# Patient Record
Sex: Female | Born: 1980
Health system: Southern US, Community
[De-identification: ages and names within clinical notes are randomized; demographics above are authoritative.]

## PROBLEM LIST (undated history)

## (undated) ENCOUNTER — Inpatient Hospital Stay (HOSPITAL_COMMUNITY): Payer: Self-pay

## (undated) DIAGNOSIS — Z8669 Personal history of other diseases of the nervous system and sense organs: Secondary | ICD-10-CM

## (undated) DIAGNOSIS — F419 Anxiety disorder, unspecified: Secondary | ICD-10-CM

## (undated) DIAGNOSIS — R51 Headache: Secondary | ICD-10-CM

## (undated) DIAGNOSIS — K831 Obstruction of bile duct: Secondary | ICD-10-CM

## (undated) DIAGNOSIS — Z8042 Family history of malignant neoplasm of prostate: Secondary | ICD-10-CM

## (undated) DIAGNOSIS — Z8639 Personal history of other endocrine, nutritional and metabolic disease: Secondary | ICD-10-CM

## (undated) DIAGNOSIS — G43909 Migraine, unspecified, not intractable, without status migrainosus: Secondary | ICD-10-CM

## (undated) DIAGNOSIS — R112 Nausea with vomiting, unspecified: Secondary | ICD-10-CM

## (undated) DIAGNOSIS — Z8679 Personal history of other diseases of the circulatory system: Secondary | ICD-10-CM

## (undated) DIAGNOSIS — D649 Anemia, unspecified: Secondary | ICD-10-CM

## (undated) DIAGNOSIS — R935 Abnormal findings on diagnostic imaging of other abdominal regions, including retroperitoneum: Secondary | ICD-10-CM

## (undated) DIAGNOSIS — Z9889 Other specified postprocedural states: Secondary | ICD-10-CM

## (undated) DIAGNOSIS — C50212 Malignant neoplasm of upper-inner quadrant of left female breast: Secondary | ICD-10-CM

## (undated) DIAGNOSIS — E039 Hypothyroidism, unspecified: Secondary | ICD-10-CM

## (undated) DIAGNOSIS — K219 Gastro-esophageal reflux disease without esophagitis: Secondary | ICD-10-CM

## (undated) DIAGNOSIS — Z803 Family history of malignant neoplasm of breast: Secondary | ICD-10-CM

## (undated) DIAGNOSIS — I10 Essential (primary) hypertension: Secondary | ICD-10-CM

## (undated) DIAGNOSIS — I890 Lymphedema, not elsewhere classified: Secondary | ICD-10-CM

## (undated) DIAGNOSIS — R748 Abnormal levels of other serum enzymes: Secondary | ICD-10-CM

## (undated) DIAGNOSIS — Z973 Presence of spectacles and contact lenses: Secondary | ICD-10-CM

## (undated) DIAGNOSIS — L309 Dermatitis, unspecified: Secondary | ICD-10-CM

## (undated) DIAGNOSIS — K8020 Calculus of gallbladder without cholecystitis without obstruction: Secondary | ICD-10-CM

## (undated) DIAGNOSIS — Z9221 Personal history of antineoplastic chemotherapy: Secondary | ICD-10-CM

## (undated) DIAGNOSIS — T8859XA Other complications of anesthesia, initial encounter: Secondary | ICD-10-CM

## (undated) DIAGNOSIS — Z923 Personal history of irradiation: Secondary | ICD-10-CM

## (undated) HISTORY — DX: Malignant neoplasm of upper-inner quadrant of left female breast: C50.212

## (undated) HISTORY — DX: Obstruction of bile duct: K83.1

## (undated) HISTORY — PX: NO PAST SURGERIES: SHX2092

## (undated) HISTORY — DX: Hypothyroidism, unspecified: E03.9

## (undated) HISTORY — DX: Family history of malignant neoplasm of prostate: Z80.42

## (undated) HISTORY — DX: Essential (primary) hypertension: I10

## (undated) HISTORY — DX: Family history of malignant neoplasm of breast: Z80.3

## (undated) HISTORY — DX: Anxiety disorder, unspecified: F41.9

## (undated) HISTORY — DX: Headache: R51

## (undated) NOTE — *Deleted (*Deleted)
[facility]  [phone] [phone]) [phone]    ID: Virginia [name] DOB: November 25, 1980  MR#: [id number]  JYN#:[id number]  Patient Care Team: Ileana Ladd, MD as PCP - General (Family Medicine) Almond Lint, MD as Consulting Physician (General Surgery) Magrinat, Valentino Hue, MD as Consulting Physician (Oncology) Lonie Peak, MD as Attending Physician (Radiation Oncology) Axel Filler Larna Daughters, NP as Nurse Practitioner (Hematology and Oncology) OTHER MD:   CHIEF COMPLAINT:  Estrogen receptor positive breast cancer  CURRENT TREATMENT: goserelin, tamoxifen   INTERVAL HISTORY: Virginia [name] returns today for 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   She continues on tamoxifen.  She is tolerating the tamoxifen moderately well and is obtaining it at a good price.  She also continues on goserelin.  She is currently at the every 58-month dose.    We are following her estradiol levels: ***  Her most recent mammography was at [facility] on ***.  She is scheduled for salpingo-oophorectomy on 02/02/2020 under Dr. Pricilla Holm.   REVIEW OF SYSTEMS: Virginia [name]    COVID 19 VACCINATION STATUS:    BREAST CANCER HISTORY: From the original intake note:  Zaire noted some itching on her breasts and since this was the symptom that preceded her grandmother's breast cancer she underwent baseline bilateral screening mammography at [facility] 01/10/2016. The breast density was category B. In the left breast central to the nipple there was a 1.7 cm irregular high density mass with pleomorphic calcifications. Accordingly the patient was brought back 01/14/2016 for left diagnostic mammography and ultrasonography. This confirmed a 0.8 cm irregular mass in the left breast at the 10:00 position. 0.5 cm from the nipple lateral and posterior to that there was a 0.2 cm cluster of amorphous calcifications with an associated density. Anterior to the index mass was a 0.5 cm cluster of amorphous  calcifications. All 3 findings taken together 3 cm. Ultrasound of the left breast found a 1 cm irregular mass in the left breast at the 10:00 position retroareolarly. There were no other sonographic abnormalities and the left axilla was sonographically benign  On 01/15/2016 the patient underwent core needle biopsy of the left breast index lesion at 10:00, and this found invasive ductal carcinoma, E-cadherin positive, estrogen receptor 90% positive, progesterone receptor 40% positive, both with moderate staining intensity, with an MIB-1 of 5%, but HER-2 amplified, with a signals ratio of 2.41 and the number per cell 6.50. One of the 2 areas of abnormal calcifications, the more posterior one measuring 0.2 cm, was also biopsied and showed only atypical ductal hyperplasia (S [id number]).  Her subsequent history is as detailed below   PAST MEDICAL HISTORY: Past Medical History:  Diagnosis Date  . Anemia    as a child  . Anxiety   . Cholestasis   . Elevated liver enzymes    during pregnancy March 2015  . Family history of breast cancer   . Family history of prostate cancer   . GERD (gastroesophageal reflux disease)    one episode  . Headache   . History of radiation therapy 06/11/16- 07/23/16   Left Breast 50 Gy in 25 fractions, Left Breast boost 10 Gy in 5 fractions.   . Hypertension   . Hypothyroidism   . Lymphedema of arm    LEFT  . Malignant neoplasm of upper-inner quadrant of left female breast (HCC) 01/16/2016    PAST SURGICAL HISTORY: Past Surgical History:  Procedure Laterality Date  . BREAST LUMPECTOMY WITH RADIOACTIVE SEED AND SENTINEL  LYMPH NODE BIOPSY Left 03/13/2016   Procedure: LEFT BREAST LUMPECTOMY WITH RADIOACTIVE SEED X 2 AND SENTINEL LYMPH NODE BIOPSY;  Surgeon: Almond Lint, MD;  Location: [facility] OR;  Service: General;  Laterality: Left;  . NO PAST SURGERIES    . PORT-A-CATH REMOVAL N/A 04/16/2017   Procedure: REMOVAL PORT-A-CATH;  Surgeon: Almond Lint, MD;   Location: [facility] OR;  Service: General;  Laterality: N/A;  . PORTACATH PLACEMENT Right 03/13/2016   Procedure: INSERTION PORT-A-CATH;  Surgeon: Almond Lint, MD;  Location: [facility] OR;  Service: General;  Laterality: Right;    FAMILY HISTORY: Family History  Problem Relation Age of Onset  . Hypertension Mother   . Hypertension Father   . Prostate cancer Father   . Liver cancer Maternal Grandmother   . Breast cancer Paternal Grandmother        dx in her 27s  . Lung cancer Paternal Aunt        smoker  . Heart attack Paternal Grandfather   . Liver cancer Other    The patient's parents are in their mid 38s as of November 2017. The patient father was diagnosed with prostate cancer at age 44. A paternal aunt was diagnosed with lung cancer in her late 32s. Also the paternal grandmother was diagnosed with breast cancer at the age of 75. On the mother's side there are 2 cases of liver cancer, age of diagnosis unknown.   GYNECOLOGIC HISTORY:  No LMP recorded. (Menstrual status: Other).  Menarche age 22, first live birth age 57, she is GX P2. She was still having periods at the time of breast cancer diagnosis.. She remotely used a number ring and oral contraceptives but stopped in 2016.more recently they used barrier methods for contraception.   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  She will be completing a nurse practitioner program December 2021.  Her husband Virginia [name] works as a Fish farm manager for a [employer] worker. Their children are Virginia [name] age 64 and in 8th grade and Virginia [name] age 60 and in [place] as of September 2019. Ayliana is not a church attender   ADVANCED DIRECTIVES: In the absence of any documents to the contrary the patient's husband is her healthcare power of attorney   HEALTH MAINTENANCE: Social History   Tobacco Use  . Smoking status: Never Smoker  . Smokeless tobacco: Never Used  Vaping Use  . Vaping Use: Never used   Substance Use Topics  . Alcohol use: No  . Drug use: No     Colonoscopy:  PAP:2016  Bone density:   Allergies  Allergen Reactions  . Amoxicillin Diarrhea and Nausea Only    Has patient had a PCN reaction causing immediate rash, facial/tongue/throat swelling, SOB or lightheadedness with hypotension: No Has patient had a PCN reaction causing severe rash involving mucus membranes or skin necrosis: No Has patient had a PCN reaction that required hospitalization No Has patient had a PCN reaction occurring within the last 10 years: Yes If all of the above answers are "NO", then may proceed with Cephalosporin use.     Current Outpatient Medications  Medication Sig Dispense Refill  . acetaminophen (TYLENOL) 500 MG tablet Take 500 mg by mouth every 6 (six) hours as needed for headache.     Marland Kitchen amLODipine (NORVASC) 5 MG tablet Take 5 mg by mouth daily.    Marland Kitchen b complex vitamins tablet Take 1 tablet by mouth daily.    Marland Kitchen gabapentin (NEURONTIN) 300  MG capsule TAKE 1 CAPSULE BY MOUTH DAILY AT BEDTIME 90 capsule 3  . goserelin (ZOLADEX) 10.8 MG injection Inject 10.8 mg into the skin every 3 (three) months. She is not sure of the exact dose     . ibuprofen (ADVIL,MOTRIN) 200 MG tablet Take 400 mg by mouth every 8 (eight) hours as needed for mild pain.     Marland Kitchen lidocaine-prilocaine (EMLA) cream Apply 1 application topically as needed. 30 g 2  . Multiple Vitamins-Minerals (MULTIVITAMIN GUMMIES ADULT PO) Take 2 tablets by mouth daily.    . NONFORMULARY OR COMPOUNDED ITEM Vitamin E vaginal suppositories 200u/ml.  One pv three times weekly. 36 each 3  . Omega-3 Fatty Acids (FISH OIL) 1000 MG CAPS Take by mouth daily.    . tamoxifen (NOLVADEX) 20 MG tablet TAKE 1 TABLET BY MOUTH EVERY DAY 90 tablet 3  . venlafaxine XR (EFFEXOR-XR) 150 MG 24 hr capsule TAKE 1 CAPSULE BY MOUTH EVERY MORNING WITH BREAKFAST 90 capsule 5   No current facility-administered medications for this visit.    OBJECTIVE:  African-American woman who appears younger than stated age  There were no vitals filed for this visit.   There is no height or weight on file to calculate BMI.    ECOG FS:1 - Symptomatic but completely ambulatory   Sclerae unicteric, EOMs intact Wearing a mask No cervical or supraclavicular adenopathy Lungs no rales or rhonchi Heart regular rate and rhythm Abd soft, nontender, positive bowel sounds MSK no focal spinal tenderness, no upper extremity lymphedema Neuro: nonfocal, well oriented, appropriate affect Breasts:    {Sclerae unicteric, EOMs intact Wearing a mask No cervical or supraclavicular adenopathy Lungs no rales or rhonchi Heart regular rate and rhythm Abd soft, nontender, positive bowel sounds MSK no focal spinal tenderness, no upper extremity lymphedema Neuro: nonfocal, well oriented, appropriate affect Breasts: The right breast is benign.  The left breast has undergone lumpectomy followed by radiation.  There is no evidence of local recurrence.  Both axillae are benign.}   LAB RESULTS:  CMP     Component Value Date/Time   NA 140 06/22/2019 1314   NA 142 02/27/2017 1350   K 4.1 06/22/2019 1314   K 3.7 02/27/2017 1350   CL 105 06/22/2019 1314   CO2 29 06/22/2019 1314   CO2 28 02/27/2017 1350   GLUCOSE 88 06/22/2019 1314   GLUCOSE 124 02/27/2017 1350   BUN 8 06/22/2019 1314   BUN 8.9 02/27/2017 1350   CREATININE 0.79 06/22/2019 1314   CREATININE 0.79 07/31/2017 1349   CREATININE 0.8 02/27/2017 1350   CALCIUM 9.0 06/22/2019 1314   CALCIUM 9.5 02/27/2017 1350   PROT 7.2 06/22/2019 1314   PROT 7.3 02/27/2017 1350   ALBUMIN 3.8 06/22/2019 1314   ALBUMIN 3.8 02/27/2017 1350   AST 18 06/22/2019 1314   AST 24 07/31/2017 1349   AST 21 02/27/2017 1350   ALT 17 06/22/2019 1314   ALT 19 07/31/2017 1349   ALT 25 02/27/2017 1350   ALKPHOS 74 06/22/2019 1314   ALKPHOS 82 02/27/2017 1350   BILITOT 0.3 06/22/2019 1314   BILITOT <0.2 (L) 07/31/2017 1349    BILITOT 0.22 02/27/2017 1350   GFRNONAA >60 06/22/2019 1314   GFRNONAA >60 07/31/2017 1349   GFRAA >60 06/22/2019 1314   GFRAA >60 07/31/2017 1349    INo results found for: SPEP, UPEP  Lab Results  Component Value Date   WBC 3.1 (L) 06/22/2019   NEUTROABS 1.3 (L) 06/22/2019  HGB 12.1 06/22/2019   HCT 39.0 06/22/2019   MCV 85.9 06/22/2019   PLT 167 06/22/2019      Chemistry      Component Value Date/Time   NA 140 06/22/2019 1314   NA 142 02/27/2017 1350   K 4.1 06/22/2019 1314   K 3.7 02/27/2017 1350   CL 105 06/22/2019 1314   CO2 29 06/22/2019 1314   CO2 28 02/27/2017 1350   BUN 8 06/22/2019 1314   BUN 8.9 02/27/2017 1350   CREATININE 0.79 06/22/2019 1314   CREATININE 0.79 07/31/2017 1349   CREATININE 0.8 02/27/2017 1350      Component Value Date/Time   CALCIUM 9.0 06/22/2019 1314   CALCIUM 9.5 02/27/2017 1350   ALKPHOS 74 06/22/2019 1314   ALKPHOS 82 02/27/2017 1350   AST 18 06/22/2019 1314   AST 24 07/31/2017 1349   AST 21 02/27/2017 1350   ALT 17 06/22/2019 1314   ALT 19 07/31/2017 1349   ALT 25 02/27/2017 1350   BILITOT 0.3 06/22/2019 1314   BILITOT <0.2 (L) 07/31/2017 1349   BILITOT 0.22 02/27/2017 1350       No results found for: LABCA2  No components found for: LABCA125  No results for input(s): INR in the last 168 hours.  Urinalysis    Component Value Date/Time   COLORURINE YELLOW 10/14/2012 1954   APPEARANCEUR CLEAR 10/14/2012 1954   LABSPEC >1.030 (H) 10/14/2012 1954   PHURINE 6.0 10/14/2012 1954   GLUCOSEU NEGATIVE 10/14/2012 1954   HGBUR TRACE (A) 10/14/2012 1954   BILIRUBINUR NEGATIVE 10/14/2012 1954   KETONESUR 15 (A) 10/14/2012 1954   PROTEINUR NEGATIVE 10/14/2012 1954   UROBILINOGEN 0.2 10/14/2012 1954   NITRITE NEGATIVE 10/14/2012 1954   LEUKOCYTESUR NEGATIVE 10/14/2012 1954    STUDIES: No results found.   ELIGIBLE FOR AVAILABLE RESEARCH PROTOCOL: no   ASSESSMENT: 66 y.o. [place] woman status post left breast  upper inner quadrant biopsy 01/15/2016 for a clinical T1c N0, stage IA invasive ductal carcinoma, E-cadherin positive, estrogen receptor 90% positive, progesterone receptor 40% positive, both with moderate staining intensity, with an MIB-1 of 5%, but HER-2 amplified, with a signals ratio of 2.41 and the number per cell 6.50.  (a) One of 2 areas of abnormal calcifications, the more posterior one measuring 0.2 cm, was also biopsied and showed only atypical ductal hyperplasia  (b) a 4.1 cm area of non-masslike enhancement in the left breast underwent biopsy 02/19/2016 showing only fibrocystic changes  (1) tamoxifen started 01/23/2016 neoadjuvantly  (2) genetics testing 02/22/2016 through the Breast/Ovarian gene panel offered by [facility] found no deleterious mutations in  ATM, BARD1, BRCA1, BRCA2, BRIP1, CDH1, CHEK2, EPCAM, FANCC, MLH1, MSH2, MSH6, NBN, PALB2, PMS2, PTEN, RAD51C, RAD51D, TP53, and XRCC2.     (3) status post left lumpectomy and sentinel lymph node sampling 03/13/2016 for an mpT1c pN0, stage IA invasive ductal carcinoma, grade 2, with negative margins.  (4) paclitaxel weekly 12 and trastuzumab every 21 days beginning on 04/10/2016.  (a) paclitaxel discontinued after 5 doses because of atypical neuropathy symptoms. Last dose 04/28/2016  (5) trastuzumab continued to complete a year--last dose 04/10/2017  (a) echocardiogram 02/11/2016 shows ejection fraction in the 60-65% range.  (b) echo 05/28/2916 shows EF in the 65-70% range  (c) echo on 09/26/2016 shows LVEF of 55-60%  (d) echo on 01/14/2017 shows EF in the 60-65%  (6) adjuvant radiation 06/11/16 - 07/23/16 Site/dose:    1) Left Breast / 50 Gy in 25 fractions 2) Left Breast  Boost / 10 Gy in 5 fractions  (7) tamoxifen resumed to 07/25/2016  (8) patient resumed menses post chemo, April 2018  (a) goserelin started 06/17/2016, initially given every 28 days, changed to every 12 weeks on 11/12/16 with estradiol monitoring  (b) referred  06/22/2019 for BSO   PLAN: Kennyth Arnold is now a little over 3 years out from definitive surgery for her breast cancer with no evidence of disease recurrence.  This is very favorable.  She is tolerating tamoxifen well and the plan is to continue that a minimum of 5 years.  She is also receiving goserelin for ovarian suppression.  She is considering bilateral salpingo-oophorectomy for convenience.  She understands that that would not be reversible.  I have placed the referral in for her.  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  She will be able to take her exams no later than January apparently and should be able to start a new job shortly thereafter  She knows to call for any other issue that may develop before the next visit.  Total encounter time 25 minutes.Raymond Gurney C. Magrinat, MD  12/26/19 10:50 PM Medical Oncology and Hematology [facility] [address] [place], [place] [address] Tel. [phone]    Fax. [phone]   I, Mickie Bail, am acting as scribe for Dr. Valentino Hue. Magrinat.  I, Ruthann Cancer MD, have reviewed the above documentation for accuracy and completeness, and I agree with the above.   *Total Encounter Time as defined by the Centers for Medicare and Medicaid Services includes, in addition to the face-to-face time of a patient visit (documented in the note above) non-face-to-face time: obtaining and reviewing outside history, ordering and reviewing medications, tests or procedures, care coordination (communications with other health care professionals or caregivers) and documentation in the medical record.

---

## 2011-12-10 ENCOUNTER — Other Ambulatory Visit (HOSPITAL_COMMUNITY)
Admission: RE | Admit: 2011-12-10 | Discharge: 2011-12-10 | Disposition: A | Discharge status: Home / Self Care | Payer: 59 | Source: Ambulatory Visit | Attending: Family Medicine | Admitting: Family Medicine

## 2011-12-10 DIAGNOSIS — Z124 Encounter for screening for malignant neoplasm of cervix: Secondary | ICD-10-CM | POA: Insufficient documentation

## 2011-12-10 DIAGNOSIS — Z1151 Encounter for screening for human papillomavirus (HPV): Secondary | ICD-10-CM | POA: Insufficient documentation

## 2012-10-14 ENCOUNTER — Inpatient Hospital Stay (HOSPITAL_COMMUNITY): Payer: BC Managed Care – PPO

## 2012-10-14 ENCOUNTER — Inpatient Hospital Stay (HOSPITAL_COMMUNITY)
Admission: AD | Admit: 2012-10-14 | Discharge: 2012-10-15 | Disposition: A | Discharge status: Home / Self Care | Payer: BC Managed Care – PPO | Source: Ambulatory Visit | Attending: Obstetrics & Gynecology | Admitting: Obstetrics & Gynecology

## 2012-10-14 ENCOUNTER — Encounter (HOSPITAL_COMMUNITY): Payer: Self-pay | Admitting: Radiology

## 2012-10-14 DIAGNOSIS — O26891 Other specified pregnancy related conditions, first trimester: Secondary | ICD-10-CM

## 2012-10-14 DIAGNOSIS — R109 Unspecified abdominal pain: Secondary | ICD-10-CM | POA: Insufficient documentation

## 2012-10-14 DIAGNOSIS — N949 Unspecified condition associated with female genital organs and menstrual cycle: Secondary | ICD-10-CM | POA: Insufficient documentation

## 2012-10-14 DIAGNOSIS — O99891 Other specified diseases and conditions complicating pregnancy: Secondary | ICD-10-CM | POA: Insufficient documentation

## 2012-10-14 DIAGNOSIS — O209 Hemorrhage in early pregnancy, unspecified: Secondary | ICD-10-CM | POA: Insufficient documentation

## 2012-10-14 DIAGNOSIS — O9989 Other specified diseases and conditions complicating pregnancy, childbirth and the puerperium: Secondary | ICD-10-CM

## 2012-10-14 LAB — URINALYSIS, ROUTINE W REFLEX MICROSCOPIC
Bilirubin Urine: NEGATIVE
Glucose, UA: NEGATIVE mg/dL
Ketones, ur: 15 mg/dL — AB
Leukocytes, UA: NEGATIVE
Nitrite: NEGATIVE
Protein, ur: NEGATIVE mg/dL
Specific Gravity, Urine: >1.03 — ABNORMAL HIGH (ref 1.005–1.030)
Urobilinogen, UA: 0.2 mg/dL (ref 0.0–1.0)
pH: 6 (ref 5.0–8.0)

## 2012-10-14 LAB — URINE MICROSCOPIC-ADD ON

## 2012-10-14 LAB — CBC
HCT: 37 % (ref 36.0–46.0)
Hemoglobin: 12.4 g/dL (ref 12.0–15.0)
MCH: 28 pg (ref 26.0–34.0)
MCHC: 33.5 g/dL (ref 30.0–36.0)
MCV: 83.5 fL (ref 78.0–100.0)
Platelets: 199 10*3/uL (ref 150–400)
RBC: 4.43 MIL/uL (ref 3.87–5.11)
RDW: 12.8 % (ref 11.5–15.5)
WBC: 9.8 10*3/uL (ref 4.0–10.5)

## 2012-10-14 LAB — WET PREP, GENITAL
Clue Cells Wet Prep HPF POC: NONE SEEN
Trich, Wet Prep: NONE SEEN
Yeast Wet Prep HPF POC: NONE SEEN

## 2012-10-14 LAB — OB RESULTS CONSOLE GC/CHLAMYDIA
Chlamydia: NEGATIVE
Gonorrhea: NEGATIVE

## 2012-10-14 LAB — HCG, QUANTITATIVE, PREGNANCY: hCG, Beta Chain, Quant, S: 11582 m[IU]/mL — ABNORMAL HIGH (ref <5)

## 2012-10-14 LAB — POCT PREGNANCY, URINE: Preg Test, Ur: POSITIVE — AB

## 2012-10-14 NOTE — MAU Provider Note (Signed)
History     CSN: [id number]  Arrival date and time: 10/14/12 4098   None     Chief Complaint  Patient presents with  . Possible Pregnancy  . Abdominal Pain   HPI  Virginia [name] is a 32 y.o. G2P1001 at 6 weeks who presents today with cramping and brown vaginal discharge.   Past Medical History  Diagnosis Date  . Medical history non-contributory     Past Surgical History  Procedure Laterality Date  . No past surgeries      Family History  Problem Relation Age of Onset  . Hypertension Mother   . Hypertension Father     History  Substance Use Topics  . Smoking status: Never Smoker   . Smokeless tobacco: Not on file  . Alcohol Use: No    Allergies: No Known Allergies  No prescriptions prior to admission    ROS Physical Exam   Blood pressure 120/66, pulse 63, temperature 97.9 F (36.6 C), temperature source Oral, resp. rate 16, height 5\' 6"  (1.676 m), weight 78.019 kg (172 lb), last menstrual period 08/28/2012, SpO2 100.00%.  Physical Exam  Nursing note and vitals reviewed. Constitutional: She is oriented to person, place, and time. She appears well-developed and well-nourished. No distress.  Respiratory: Effort normal.  GI: Soft. There is no tenderness.  Genitourinary:   External: no lesion Vagina: small amount of white discharge Cervix: pink, smooth, no CMT Uterus: NSSC Adnexa: NT   Neurological: She is alert and oriented to person, place, and time.  Skin: Skin is warm and dry.  Psychiatric: She has a normal mood and affect.    MAU Course  Procedures  Results for orders placed during the hospital encounter of 10/14/12 (from the past 24 hour(s))  URINALYSIS, ROUTINE W REFLEX MICROSCOPIC     Status: Abnormal   Collection Time    10/14/12  7:54 PM      Result Value Range   Color, Urine YELLOW  YELLOW   APPearance CLEAR  CLEAR   Specific Gravity, Urine >1.030 (*) 1.005 - 1.030   pH 6.0  5.0 - 8.0   Glucose, UA NEGATIVE  NEGATIVE mg/dL    Hgb urine dipstick TRACE (*) NEGATIVE   Bilirubin Urine NEGATIVE  NEGATIVE   Ketones, ur 15 (*) NEGATIVE mg/dL   Protein, ur NEGATIVE  NEGATIVE mg/dL   Urobilinogen, UA 0.2  0.0 - 1.0 mg/dL   Nitrite NEGATIVE  NEGATIVE   Leukocytes, UA NEGATIVE  NEGATIVE  URINE MICROSCOPIC-ADD ON     Status: None   Collection Time    10/14/12  7:54 PM      Result Value Range   Squamous Epithelial / LPF RARE  RARE   WBC, UA 0-2  <3 WBC/hpf   RBC / HPF 0-2  <3 RBC/hpf   Urine-Other MUCOUS PRESENT    CBC     Status: None   Collection Time    10/14/12  8:11 PM      Result Value Range   WBC 9.8  4.0 - 10.5 K/uL   RBC 4.43  3.87 - 5.11 MIL/uL   Hemoglobin 12.4  12.0 - 15.0 g/dL   HCT 11.9  14.7 - 82.9 %   MCV 83.5  78.0 - 100.0 fL   MCH 28.0  26.0 - 34.0 pg   MCHC 33.5  30.0 - 36.0 g/dL   RDW 56.2  13.0 - 86.5 %   Platelets 199  150 - 400 K/uL  POCT PREGNANCY, URINE  Status: Abnormal   Collection Time    10/14/12  8:13 PM      Result Value Range   Preg Test, Ur POSITIVE (*) NEGATIVE  ABO/RH     Status: None   Collection Time    10/14/12  8:20 PM      Result Value Range   ABO/RH(D) O POS    HCG, QUANTITATIVE, PREGNANCY     Status: Abnormal   Collection Time    10/14/12  8:20 PM      Result Value Range   hCG, Beta Chain, Quant, [place] [address] (*) <5 mIU/mL  WET PREP, GENITAL     Status: Abnormal   Collection Time    10/14/12 11:07 PM      Result Value Range   Yeast Wet Prep HPF POC NONE SEEN  NONE SEEN   Trich, Wet Prep NONE SEEN  NONE SEEN   Clue Cells Wet Prep HPF POC NONE SEEN  NONE SEEN   WBC, Wet Prep HPF POC FEW (*) NONE SEEN   US Ob Comp Less 14 Wks  10/14/2012   *RADIOLOGY REPORT*  Clinical Data: Abdominal pain.  Vaginal discharge.  Early pregnancy.  OBSTETRIC <14 WK [place] AND TRANSVAGINAL OB US  Technique:  Both transabdominal and transvaginal ultrasound examinations were performed for complete evaluation of the gestation as well as the maternal uterus, adnexal regions, and pelvic  cul-de-sac.  Transvaginal technique was performed to assess early pregnancy.  Comparison:  None.  Intrauterine gestational sac:  Single Yolk sac: Yes Embryo: Yes Cardiac Activity: Yes Heart Rate: 170 bpm CRL: 2.2  mm  five w  six d         [place] EDC: 06/10/2013  Maternal uterus/adnexae: There is a tiny subchorionic hemorrhage.  The ovaries are normal. Trace of free fluid in the pelvis.  IMPRESSION: Single intrauterine pregnancy of 5 weeks 6 days gestation.  Tiny subchorionic hemorrhage.   Original Report Authenticated By: Francene Boyers, M.D.   US Ob Transvaginal  10/14/2012   *RADIOLOGY REPORT*  Clinical Data: Abdominal pain.  Vaginal discharge.  Early pregnancy.  OBSTETRIC <14 WK [place] AND TRANSVAGINAL OB US  Technique:  Both transabdominal and transvaginal ultrasound examinations were performed for complete evaluation of the gestation as well as the maternal uterus, adnexal regions, and pelvic cul-de-sac.  Transvaginal technique was performed to assess early pregnancy.  Comparison:  None.  Intrauterine gestational sac:  Single Yolk sac: Yes Embryo: Yes Cardiac Activity: Yes Heart Rate: 170 bpm CRL: 2.2  mm  five w  six d         [place] EDC: 06/10/2013  Maternal uterus/adnexae: There is a tiny subchorionic hemorrhage.  The ovaries are normal. Trace of free fluid in the pelvis.  IMPRESSION: Single intrauterine pregnancy of 5 weeks 6 days gestation.  Tiny subchorionic hemorrhage.   Original Report Authenticated By: Francene Boyers, M.D.     Assessment and Plan   1. Pelvic pain complicating pregnancy, antepartum, first trimester    Start [facility] as planned 1st trimester danger signs reviewed  Tawnya Crook 10/15/2012, 12:31 AM

## 2012-10-14 NOTE — MAU Note (Signed)
Pt states she was having cramping pain at her sides and she had the pain again about 0900,

## 2012-10-14 NOTE — MAU Note (Signed)
Pt reports she thinks she is having a miscarriage, states positive pregnancy test 3 weeks ago, has had strong cramping x 2 weeks off/on. States very painful at times, brownish discharge since yesterday.

## 2012-10-15 LAB — ABO/RH: ABO/RH(D): O POS

## 2012-10-15 LAB — GC/CHLAMYDIA PROBE AMP
CT Probe RNA: NEGATIVE
GC Probe RNA: NEGATIVE

## 2012-10-18 NOTE — MAU Provider Note (Signed)
Attestation of Attending Supervision of Advanced Practitioner (CNM/NP): Evaluation and management procedures were performed by the Advanced Practitioner under my supervision and collaboration.  I have reviewed the Advanced Practitioner's note and chart, and I agree with the management and plan.  HARRAWAY-SMITH, Zayaan Kozak 2:04 PM

## 2012-11-30 LAB — OB RESULTS CONSOLE ANTIBODY SCREEN: Antibody Screen: NEGATIVE

## 2012-11-30 LAB — OB RESULTS CONSOLE RPR: RPR: NONREACTIVE

## 2012-11-30 LAB — OB RESULTS CONSOLE ABO/RH: RH Type: POSITIVE

## 2012-11-30 LAB — OB RESULTS CONSOLE HIV ANTIBODY (ROUTINE TESTING): HIV: NONREACTIVE

## 2012-11-30 LAB — OB RESULTS CONSOLE RUBELLA ANTIBODY, IGM: Rubella: IMMUNE

## 2012-11-30 LAB — OB RESULTS CONSOLE HEPATITIS B SURFACE ANTIGEN: Hepatitis B Surface Ag: NEGATIVE

## 2013-02-24 NOTE — L&D Delivery Note (Signed)
Patient was C/C/+2 and pushed for 40 minutes with epidural.   NSVD  female infant, Apgars 9,9, weight P.   The patient had no lacerations. Fundus was firm. EBL was expected. Placenta was delivered intact. Vagina was clear.  Baby was vigorous and doing skin to skin with mother.  Pt desires PP tubal but first case not available until after 3 pm- will schedule 6 weeks pp.  Saia Derossett A

## 2013-04-28 ENCOUNTER — Encounter (HOSPITAL_COMMUNITY): Payer: Self-pay | Admitting: *Deleted

## 2013-04-28 ENCOUNTER — Observation Stay (HOSPITAL_COMMUNITY)
Admission: AD | Admit: 2013-04-28 | Discharge: 2013-04-29 | DRG: 781 | Disposition: A | Discharge status: Home / Self Care | Payer: BC Managed Care – PPO | Source: Ambulatory Visit | Attending: Obstetrics and Gynecology | Admitting: Obstetrics and Gynecology

## 2013-04-28 DIAGNOSIS — O9989 Other specified diseases and conditions complicating pregnancy, childbirth and the puerperium: Secondary | ICD-10-CM | POA: Diagnosis present

## 2013-04-28 DIAGNOSIS — R7989 Other specified abnormal findings of blood chemistry: Secondary | ICD-10-CM | POA: Diagnosis present

## 2013-04-28 DIAGNOSIS — L299 Pruritus, unspecified: Secondary | ICD-10-CM | POA: Diagnosis present

## 2013-04-28 DIAGNOSIS — K838 Other specified diseases of biliary tract: Secondary | ICD-10-CM | POA: Diagnosis present

## 2013-04-28 DIAGNOSIS — O26649 Intrahepatic cholestasis of pregnancy, unspecified trimester: Secondary | ICD-10-CM | POA: Diagnosis present

## 2013-04-28 DIAGNOSIS — O26619 Liver and biliary tract disorders in pregnancy, unspecified trimester: Principal | ICD-10-CM | POA: Diagnosis present

## 2013-04-28 DIAGNOSIS — K831 Obstruction of bile duct: Secondary | ICD-10-CM | POA: Diagnosis present

## 2013-04-28 LAB — COMPREHENSIVE METABOLIC PANEL
ALT: 160 U/L — ABNORMAL HIGH (ref 0–35)
AST: 108 U/L — ABNORMAL HIGH (ref 0–37)
Albumin: 2.6 g/dL — ABNORMAL LOW (ref 3.5–5.2)
Alkaline Phosphatase: 163 U/L — ABNORMAL HIGH (ref 39–117)
BUN: 7 mg/dL (ref 6–23)
CO2: 25 mEq/L (ref 19–32)
Calcium: 9.1 mg/dL (ref 8.4–10.5)
Chloride: 102 mEq/L (ref 96–112)
Creatinine, Ser: 0.53 mg/dL (ref 0.50–1.10)
GFR calc Af Amer: >90 mL/min (ref >90)
GFR calc non Af Amer: >90 mL/min (ref >90)
Glucose, Bld: 82 mg/dL (ref 70–99)
Potassium: 3.9 mEq/L (ref 3.7–5.3)
Sodium: 137 mEq/L (ref 137–147)
Total Bilirubin: <0.2 mg/dL — ABNORMAL LOW (ref 0.3–1.2)
Total Protein: 6.8 g/dL (ref 6.0–8.3)

## 2013-04-28 LAB — CBC
HCT: 34.1 % — ABNORMAL LOW (ref 36.0–46.0)
Hemoglobin: 11.2 g/dL — ABNORMAL LOW (ref 12.0–15.0)
MCH: 28.2 pg (ref 26.0–34.0)
MCHC: 32.8 g/dL (ref 30.0–36.0)
MCV: 85.9 fL (ref 78.0–100.0)
Platelets: 210 10*3/uL (ref 150–400)
RBC: 3.97 MIL/uL (ref 3.87–5.11)
RDW: 13.2 % (ref 11.5–15.5)
WBC: 8.6 10*3/uL (ref 4.0–10.5)

## 2013-04-28 LAB — URIC ACID: Uric Acid, Serum: 3.3 mg/dL (ref 2.4–7.0)

## 2013-04-28 MED ORDER — BETAMETHASONE SOD PHOS & ACET 6 (3-3) MG/ML IJ SUSP
12.0000 mg | INTRAMUSCULAR | Status: AC
  Administered 2013-04-28 – 2013-04-29 (×2): 12 mg via INTRAMUSCULAR
  Filled 2013-04-28 (×2): qty 2

## 2013-04-28 MED ORDER — ZOLPIDEM TARTRATE 5 MG PO TABS: 5.0000 mg | ORAL_TABLET | Freq: Every evening | ORAL | Status: DC | PRN

## 2013-04-28 MED ORDER — DOCUSATE SODIUM 100 MG PO CAPS
100.0000 mg | ORAL_CAPSULE | Freq: Every day | ORAL | Status: DC
  Administered 2013-04-29: 100 mg via ORAL
  Filled 2013-04-28: qty 1

## 2013-04-28 MED ORDER — CALCIUM CARBONATE ANTACID 500 MG PO CHEW: 2.0000 | CHEWABLE_TABLET | ORAL | Status: DC | PRN

## 2013-04-28 MED ORDER — URSODIOL 300 MG PO CAPS
300.0000 mg | ORAL_CAPSULE | Freq: Two times a day (BID) | ORAL | Status: DC
  Administered 2013-04-28 – 2013-04-29 (×2): 300 mg via ORAL
  Filled 2013-04-28 (×4): qty 1

## 2013-04-28 MED ORDER — PRENATAL MULTIVITAMIN CH
1.0000 | ORAL_TABLET | Freq: Every day | ORAL | Status: DC
  Administered 2013-04-29: 1 via ORAL
  Filled 2013-04-28: qty 1

## 2013-04-28 MED ORDER — ACETAMINOPHEN 325 MG PO TABS
650.0000 mg | ORAL_TABLET | ORAL | Status: DC | PRN
  Administered 2013-04-29: 650 mg via ORAL
  Filled 2013-04-28: qty 2

## 2013-04-28 NOTE — H&P (Addendum)
Virginia [name] is a 33 y.o. female presenting for cholestasis of pregnancy with elevated liver functions  33 yo G2P1001 @ 33+6 presented to the office this afternoon for routine prenatal care. She has a 2 week history of Systemic itching. Patient presented to her primary care physician for evaluation and had blood work drawn last week. She presented to the office today for her routine prenatal visit and informed the physician of her symptoms and that blood work had been previously performed. Her primary care physician's office was contacted and her results obtained. Her AST was 163, ALT 206, and bile acids 15.7. She was placed on ursodiol by her PCP. History OB History   Grav Para Term Preterm Abortions TAB SAB Ect Mult Living   2 1 1       1      Past Medical History  Diagnosis Date  . Medical history non-contributory    Past Surgical History  Procedure Laterality Date  . No past surgeries     Family History: family history includes Hypertension in her father and mother. Social History:  reports that she has never smoked. She does not have any smokeless tobacco history on file. She reports that she does not drink alcohol or use illicit drugs.   Prenatal Transfer Tool  Maternal Diabetes: No Genetic Screening: Normal, 78 XX by amnio Maternal Ultrasounds/Referrals: Normal Fetal Ultrasounds or other Referrals:  None Maternal Substance Abuse:  No Significant Maternal Medications:  Meds include: Other: Ursodiol Significant Maternal Lab Results:  None Other Comments:  None  ROS: systemic itching    Blood pressure 132/69, pulse 78, temperature 97.6 F (36.4 C), resp. rate 18, height 5\' 6"  (1.676 m), weight 96.163 kg (212 lb), last menstrual period 08/28/2012. Exam Physical Exam  Prenatal labs: ABO, Rh: --/--/O POS (08/21 2020) Antibody:  Negative Rubella:  Immune RPR:   NR HBsAg:   Neg HIV:   NR GBS:   Pending  CMP     Component Value Date/Time   NA 137 04/28/2013 1824   K  3.9 04/28/2013 1824   CL 102 04/28/2013 1824   CO2 25 04/28/2013 1824   GLUCOSE 82 04/28/2013 1824   BUN 7 04/28/2013 1824   CREATININE 0.53 04/28/2013 1824   CALCIUM 9.1 04/28/2013 1824   PROT 6.8 04/28/2013 1824   ALBUMIN 2.6* 04/28/2013 1824   AST 108* 04/28/2013 1824   ALT 160* 04/28/2013 1824   ALKPHOS 163* 04/28/2013 1824   BILITOT <0.2* 04/28/2013 1824   GFRNONAA >90 04/28/2013 1824   GFRAA >90 04/28/2013 1824    CBC    Component Value Date/Time   WBC 8.6 04/28/2013 1824   RBC 3.97 04/28/2013 1824   HGB 11.2* 04/28/2013 1824   HCT 34.1* 04/28/2013 1824   PLT 210 04/28/2013 1824   MCV 85.9 04/28/2013 1824   MCH 28.2 04/28/2013 1824   MCHC 32.8 04/28/2013 1824   RDW 13.2 04/28/2013 1824    Assessment/Plan: 1) Admit 2) Repeat CMP, LDH, Uric, Bile acids 3) BMZ to enhance FLM 4) AST & ALT improved tonight compared to last blood draw. Bile acids pending.  4) MFM consultation for recommendations on expectant management vs delivery  Virginia [name] H. 04/28/2013, 7:10 PM

## 2013-04-29 ENCOUNTER — Inpatient Hospital Stay (HOSPITAL_COMMUNITY): Payer: BC Managed Care – PPO

## 2013-04-29 LAB — TSH: TSH: 1.597 u[IU]/mL (ref 0.350–4.500)

## 2013-04-29 LAB — T4, FREE: Free T4: 0.81 ng/dL (ref 0.80–1.80)

## 2013-04-29 MED ORDER — HYDROXYZINE HCL 25 MG PO TABS
25.0000 mg | ORAL_TABLET | Freq: Three times a day (TID) | ORAL | Status: DC | PRN
  Administered 2013-04-29: 25 mg via ORAL
  Filled 2013-04-29 (×2): qty 1

## 2013-04-29 MED ORDER — HYDROXYZINE HCL 25 MG PO TABS: 25.0000 mg | ORAL_TABLET | Freq: Three times a day (TID) | ORAL | Status: DC | PRN

## 2013-04-29 NOTE — Discharge Summary (Signed)
  Virginia [name] was admitted with elevated LFTs and bile acids with cholestasis of pregnancy.  FHT remained reassuring.  LFTs were slightly improved from outpatient levels.  MFM consulted with patient and recommended outpatient treatment with ursodiol 500mg  bid, with possible increase to tid and addition of hydoxyzine 25mg  prn itching.  She was instructed to return to the office Tuesday for NST and fetal kick count precautions were reviewed.  I reviewed other instructions including biweekly NSTs with weekly AFI and delivery between 36 and 37 weeks, no later than 37weeks.  Patient verbalize understanding of the risks of cholestasis including fetal demise.

## 2013-04-29 NOTE — Progress Notes (Signed)
HD#2 cholestasis of pregnancy with elevated LFTs  Filed Vitals:   04/28/13 1752 04/28/13 2000 04/29/13 0112 04/29/13 0755  BP:  121/69 111/60 118/70  Pulse:  81 99 84  Temp:  97.7 F (36.5 C)  97.8 F (36.6 C)  TempSrc:  Oral  Oral  Resp:  18 20 20   Height: 5\' 6"  (1.676 m)     Weight: 96.163 kg (212 lb)         Lab Results  Component Value Date   WBC 8.6 04/28/2013   HGB 11.2* 04/28/2013   HCT 34.1* 04/28/2013   MCV 85.9 04/28/2013   PLT 210 04/28/2013      A/P 34.0 S/p MFM consult this am, no note yet, however patient states they told her she was ok for outpatient mgmt, antenatal testing and delivery at 37 weeks. Due for second dose of betamethasone around 5-6pm Will await full note from MFM and anticipate d/c this evening. Patient instructed to call office for antenatal testing appt Tuesday.  Virginia [name]

## 2013-04-29 NOTE — Consult Note (Signed)
Maternal Fetal Medicine Consultation  Requesting Provider(s): Vanessa Kick, MD  Reason for consultation: cholestasis of pregnancy  HPI: Virginia [name] is a 33 yo G2P1001, EDD 06/10/2013 currently at 34 0/7 weeks who is currently admitted due to a diagnosis of cholestasis of pregnancy.  She report the onset of pruritis approximately 2 weeks ago - mostly on back/ abdomen as well as the soles of her hands and feet.  She was seen by her PCM and started on Ursodiol.  She reports that the pruritis has improved "maybe a little bit".  She also reports that she had similar symptoms with her last pregnancy but was never given this diagnosis.  Her prenatal course has otherwise been uncomplicated.    OB History: OB History   Grav Para Term Preterm Abortions TAB SAB Ect Mult Living   2 1 1       1       PMH:  Past Medical History  Diagnosis Date  . Medical history non-contributory     PSH:  Past Surgical History  Procedure Laterality Date  . No past surgeries     Meds:  Scheduled Meds: . betamethasone acetate-betamethasone sodium phosphate  12 mg Intramuscular Q24H  . docusate sodium  100 mg Oral Daily  . prenatal multivitamin  1 tablet Oral Q1200  . ursodiol  300 mg Oral BID   Continuous Infusions:  PRN Meds:.acetaminophen, calcium carbonate, zolpidem  Allergies: No Known Allergies  FH:  Family History  Problem Relation Age of Onset  . Hypertension Mother   . Hypertension Father    Soc:  History   Social History  . Marital Status: Married    Spouse Name: N/A    Number of Children: N/A  . Years of Education: N/A   Occupational History  . Not on file.   Social History Main Topics  . Smoking status: Never Smoker   . Smokeless tobacco: Not on file  . Alcohol Use: No  . Drug Use: No  . Sexual Activity: Yes   Other Topics Concern  . Not on file   Social History Narrative  . No narrative on file    Review of Systems: no vaginal bleeding or cramping/contractions, no  LOF, no nausea/vomiting. All other systems reviewed and are negative.  PE:   Filed Vitals:   04/29/13 0755  BP: 118/70  Pulse: 84  Temp: 97.8 F (36.6 C)  Resp: 20   Labs: CBC    Component Value Date/Time   WBC 8.6 04/28/2013 1824   RBC 3.97 04/28/2013 1824   HGB 11.2* 04/28/2013 1824   HCT 34.1* 04/28/2013 1824   PLT 210 04/28/2013 1824   MCV 85.9 04/28/2013 1824   MCH 28.2 04/28/2013 1824   MCHC 32.8 04/28/2013 1824   RDW 13.2 04/28/2013 1824   CMP     Component Value Date/Time   NA 137 04/28/2013 1824   K 3.9 04/28/2013 1824   CL 102 04/28/2013 1824   CO2 25 04/28/2013 1824   GLUCOSE 82 04/28/2013 1824   BUN 7 04/28/2013 1824   CREATININE 0.53 04/28/2013 1824   CALCIUM 9.1 04/28/2013 1824   PROT 6.8 04/28/2013 1824   ALBUMIN 2.6* 04/28/2013 1824   AST 108* 04/28/2013 1824   ALT 160* 04/28/2013 1824   ALKPHOS 163* 04/28/2013 1824   BILITOT <0.2* 04/28/2013 1824   GFRNONAA >90 04/28/2013 1824   GFRAA >90 04/28/2013 1824   Bile acids - 15.7   A/P: 1) Single IUP at 34 0/7 weeks  2) Cholestasis of pregnancy - based on elevated bile acids, diffuse pruritis and elevated LFTs.  Serum aminotransferases in the 100's and not unusual and values into the 1000's have been reported.  The cardial feature of ICP (pruritis) makes other forms of liver disease much less likely (i.e. HELLP syndrome).  We briefly discussed perinatal morbidity including increased risk of prematurity, meconium-stained amniotic fluid and intrauterine fetal demise.  The risk of spontaneous fetal death attributed to ICP is approximately 1.2% after 37 weeks and appears to be increased with higher bile acid levels.  Recommendations: 1) Would increase Ursodiol to 500 mg BID - may increase to 300 TID if needed and continue until delivery.  Would add Hydroxyzine 25-50 mg daily for symptomatic relief if needed. 2) Feel that the patient may be managed as an outpatient.  Would recommend 2x weekly NSTs with weekly AFIs although the value of antepartum  fetal testing to identify fetuses at risk for demise in the setting of ICP is unproven. 3) There is no clear consensus regarding the timing of delivery; however the median gestational age for spontaneous fetal demise with ICP occurs at [redacted] weeks gestation.  I would recommend delivery at 36-37 weeks (with delivery no later than [redacted] weeks gestation).  My practice is not to perform amniocentesis for fetal lung maturity and concur with course of betamethasone as written.   Thank you for the opportunity to be a part of the care of Virginia [name]. Please contact our office if we can be of further assistance.   I spent approximately 30 minutes with this patient with over 50% of time spent in face-to-face counseling.  Benjaman Lobe, MD Maternal Fetal Medicine

## 2013-05-01 LAB — CULTURE, BETA STREP (GROUP B ONLY)

## 2013-05-11 ENCOUNTER — Inpatient Hospital Stay (HOSPITAL_COMMUNITY): Payer: BC Managed Care – PPO

## 2013-05-11 ENCOUNTER — Encounter (HOSPITAL_COMMUNITY): Payer: Self-pay | Admitting: *Deleted

## 2013-05-11 ENCOUNTER — Inpatient Hospital Stay (HOSPITAL_COMMUNITY)
Admission: AD | Admit: 2013-05-11 | Discharge: 2013-05-11 | Disposition: A | Discharge status: Home / Self Care | Payer: BC Managed Care – PPO | Source: Ambulatory Visit | Attending: Obstetrics and Gynecology | Admitting: Obstetrics and Gynecology

## 2013-05-11 ENCOUNTER — Telehealth (HOSPITAL_COMMUNITY): Payer: Self-pay | Admitting: *Deleted

## 2013-05-11 DIAGNOSIS — R109 Unspecified abdominal pain: Secondary | ICD-10-CM | POA: Insufficient documentation

## 2013-05-11 DIAGNOSIS — O26849 Uterine size-date discrepancy, unspecified trimester: Secondary | ICD-10-CM | POA: Insufficient documentation

## 2013-05-11 DIAGNOSIS — K831 Obstruction of bile duct: Secondary | ICD-10-CM

## 2013-05-11 DIAGNOSIS — O4703 False labor before 37 completed weeks of gestation, third trimester: Secondary | ICD-10-CM

## 2013-05-11 DIAGNOSIS — E669 Obesity, unspecified: Secondary | ICD-10-CM | POA: Insufficient documentation

## 2013-05-11 DIAGNOSIS — O4700 False labor before 37 completed weeks of gestation, unspecified trimester: Secondary | ICD-10-CM | POA: Insufficient documentation

## 2013-05-11 DIAGNOSIS — O26613 Liver and biliary tract disorders in pregnancy, third trimester: Secondary | ICD-10-CM

## 2013-05-11 DIAGNOSIS — M549 Dorsalgia, unspecified: Secondary | ICD-10-CM | POA: Insufficient documentation

## 2013-05-11 DIAGNOSIS — O26619 Liver and biliary tract disorders in pregnancy, unspecified trimester: Secondary | ICD-10-CM | POA: Insufficient documentation

## 2013-05-11 DIAGNOSIS — K838 Other specified diseases of biliary tract: Secondary | ICD-10-CM | POA: Insufficient documentation

## 2013-05-11 DIAGNOSIS — O99210 Obesity complicating pregnancy, unspecified trimester: Secondary | ICD-10-CM

## 2013-05-11 NOTE — MAU Provider Note (Signed)
Chief Complaint:  Abdominal Pain and Back Pain   First Provider Initiated Contact with Patient 05/11/13 1332      HPI: Virginia [name] is a 33 y.o. G2P1001 at [redacted]w[redacted]d who has been feeling intermittent q 15-20 min lower abdominal contraction pain and LBP since around 8:30 this morning. She had normal ultrasound 5 days ago and reactive NST yesterday. Denies leakage of fluid or vaginal bleeding. Good fetal movement.   Pregnancy Course: Cholestasis of pregnancy with elevated LFTs, on ursodiol 500 mg bid and received BMZ; obesity; AMA  Past Medical History: Past Medical History  Diagnosis Date  . Hypothyroidism     Past obstetric history: OB History  Gravida Para Term Preterm AB SAB TAB Ectopic Multiple Living  2 1 1       1     # Outcome Date GA Lbr Len/2nd Weight Sex Delivery Anes PTL Lv  2 CUR           1 TRM 2005 [redacted]w[redacted]d  3.232 kg (7 lb 2 oz) M SVD   Y      Past Surgical History: Past Surgical History  Procedure Laterality Date  . No past surgeries       Family History: Family History  Problem Relation Age of Onset  . Hypertension Mother   . Hypertension Father     Social History: History  Substance Use Topics  . Smoking status: Never Smoker   . Smokeless tobacco: Not on file  . Alcohol Use: No    Allergies: No Known Allergies  Meds:  Prescriptions prior to admission  Medication Sig Dispense Refill  . hydrOXYzine (ATARAX/VISTARIL) 25 MG tablet Take 1 tablet (25 mg total) by mouth 3 (three) times daily as needed (itching).  30 tablet  0  . Prenatal Vit-Fe Fumarate-FA (PRENATAL MULTIVITAMIN) TABS tablet Take 1 tablet by mouth at bedtime.        ROS: Pertinent findings in history of present illness.  Physical Exam  Blood pressure 109/67, pulse 118, temperature 97.9 F (36.6 C), temperature source Oral, resp. rate 20, height 5\' 6"  (1.676 m), weight 95.346 kg (210 lb 3.2 oz), last menstrual period 08/28/2012. GENERAL: Well-developed, well-nourished female in no  acute distress.  HEENT: normocephalic HEART: normal rate RESP: normal effort ABDOMEN: Soft, non-tender, gravid appropriate for gestational age EXTREMITIES: Nontender, no edema NEURO: alert and oriented SPECULUM EXAM: NEFG, physiologic discharge, no blood, cervix clean Dilation: Fingertip Effacement (%): Thick Cervical Position: Posterior Presentation: Vertex Exam by:: D. Adleigh Mcmasters, CNM Cx post, ext loose 1, int closed, cephalic ballotable FHT:  Baseline 140>130, moderate variability, accelerations present, occsional mild variables 115-120 decelerations Contractions: irregular, mild      Labs: No results found for this or any previous visit (from the past 24 hour(s)).  Imaging:  BPP 8/8  MAU Course: BPP10/10  Assessment: 1. False labor before 37 completed weeks of gestation in third trimester   2. Cholestasis of pregnancy in third trimester     Plan: Discharge home Labor precautions and fetal kick counts    Medication List         acetaminophen 325 MG tablet  Commonly known as:  TYLENOL  Take 650 mg by mouth every 6 (six) hours as needed.     hydrOXYzine 25 MG tablet  Commonly known as:  ATARAX/VISTARIL  Take 1 tablet (25 mg total) by mouth 3 (three) times daily as needed (itching).     prenatal multivitamin Tabs tablet  Take 1 tablet by mouth at bedtime.  Follow-up Information   Follow up with HORVATH,MICHELLE A, MD On 05/13/2013.   Specialty:  Obstetrics and Gynecology   Contact information:   [place] [id number] [phone]       Lorene Dy, CNM 05/11/2013 1:40 PM

## 2013-05-11 NOTE — Telephone Encounter (Signed)
Preadmission screen  

## 2013-05-11 NOTE — Discharge Instructions (Signed)
Fetal Movement Counts Patient Name: __________________________________________________ Patient Due Date: ____________________ Performing a fetal movement count is highly recommended in high-risk pregnancies, but it is good for every pregnant woman to do. Your caregiver may ask you to start counting fetal movements at 28 weeks of the pregnancy. Fetal movements often increase:  After eating a full meal.  After physical activity.  After eating or drinking something sweet or cold.  At rest. Pay attention to when you feel the baby is most active. This will help you notice a pattern of your baby's sleep and wake cycles and what factors contribute to an increase in fetal movement. It is important to perform a fetal movement count at the same time each day when your baby is normally most active.  HOW TO COUNT FETAL MOVEMENTS 1. Find a quiet and comfortable area to sit or lie down on your left side. Lying on your left side provides the best blood and oxygen circulation to your baby. 2. Write down the day and time on a sheet of paper or in a journal. 3. Start counting kicks, flutters, swishes, rolls, or jabs in a 2 hour period. You should feel at least 10 movements within 2 hours. 4. If you do not feel 10 movements in 2 hours, wait 2 3 hours and count again. Look for a change in the pattern or not enough counts in 2 hours. SEEK MEDICAL CARE IF:  You feel less than 10 counts in 2 hours, tried twice.  There is no movement in over an hour.  The pattern is changing or taking longer each day to reach 10 counts in 2 hours.  You feel the baby is not moving as he or she usually does. Date: ____________ Movements: ____________ Start time: ____________ Finish time: ____________  Date: ____________ Movements: ____________ Start time: ____________ Finish time: ____________ Date: ____________ Movements: ____________ Start time: ____________ Finish time: ____________ Date: ____________ Movements: ____________  Start time: ____________ Finish time: ____________ Date: ____________ Movements: ____________ Start time: ____________ Finish time: ____________ Date: ____________ Movements: ____________ Start time: ____________ Finish time: ____________ Date: ____________ Movements: ____________ Start time: ____________ Finish time: ____________ Date: ____________ Movements: ____________ Start time: ____________ Finish time: ____________  Date: ____________ Movements: ____________ Start time: ____________ Finish time: ____________ Date: ____________ Movements: ____________ Start time: ____________ Finish time: ____________ Date: ____________ Movements: ____________ Start time: ____________ Finish time: ____________ Date: ____________ Movements: ____________ Start time: ____________ Finish time: ____________ Date: ____________ Movements: ____________ Start time: ____________ Finish time: ____________ Date: ____________ Movements: ____________ Start time: ____________ Finish time: ____________ Date: ____________ Movements: ____________ Start time: ____________ Finish time: ____________  Date: ____________ Movements: ____________ Start time: ____________ Finish time: ____________ Date: ____________ Movements: ____________ Start time: ____________ Finish time: ____________ Date: ____________ Movements: ____________ Start time: ____________ Finish time: ____________ Date: ____________ Movements: ____________ Start time: ____________ Finish time: ____________ Date: ____________ Movements: ____________ Start time: ____________ Finish time: ____________ Date: ____________ Movements: ____________ Start time: ____________ Finish time: ____________ Date: ____________ Movements: ____________ Start time: ____________ Finish time: ____________  Date: ____________ Movements: ____________ Start time: ____________ Finish time: ____________ Date: ____________ Movements: ____________ Start time: ____________ Finish time:  ____________ Date: ____________ Movements: ____________ Start time: ____________ Finish time: ____________ Date: ____________ Movements: ____________ Start time: ____________ Finish time: ____________ Date: ____________ Movements: ____________ Start time: ____________ Finish time: ____________ Date: ____________ Movements: ____________ Start time: ____________ Finish time: ____________ Date: ____________ Movements: ____________ Start time: ____________ Finish time: ____________  Date: ____________ Movements: ____________ Start time: ____________ Finish   time: ____________ Date: ____________ Movements: ____________ Start time: ____________ Finish time: ____________ Date: ____________ Movements: ____________ Start time: ____________ Finish time: ____________ Date: ____________ Movements: ____________ Start time: ____________ Finish time: ____________ Date: ____________ Movements: ____________ Start time: ____________ Finish time: ____________ Date: ____________ Movements: ____________ Start time: ____________ Finish time: ____________ Date: ____________ Movements: ____________ Start time: ____________ Finish time: ____________  Date: ____________ Movements: ____________ Start time: ____________ Finish time: ____________ Date: ____________ Movements: ____________ Start time: ____________ Finish time: ____________ Date: ____________ Movements: ____________ Start time: ____________ Finish time: ____________ Date: ____________ Movements: ____________ Start time: ____________ Finish time: ____________ Date: ____________ Movements: ____________ Start time: ____________ Finish time: ____________ Date: ____________ Movements: ____________ Start time: ____________ Finish time: ____________ Date: ____________ Movements: ____________ Start time: ____________ Finish time: ____________  Date: ____________ Movements: ____________ Start time: ____________ Finish time: ____________ Date: ____________ Movements:  ____________ Start time: ____________ Finish time: ____________ Date: ____________ Movements: ____________ Start time: ____________ Finish time: ____________ Date: ____________ Movements: ____________ Start time: ____________ Finish time: ____________ Date: ____________ Movements: ____________ Start time: ____________ Finish time: ____________ Date: ____________ Movements: ____________ Start time: ____________ Finish time: ____________ Date: ____________ Movements: ____________ Start time: ____________ Finish time: ____________  Date: ____________ Movements: ____________ Start time: ____________ Finish time: ____________ Date: ____________ Movements: ____________ Start time: ____________ Finish time: ____________ Date: ____________ Movements: ____________ Start time: ____________ Finish time: ____________ Date: ____________ Movements: ____________ Start time: ____________ Finish time: ____________ Date: ____________ Movements: ____________ Start time: ____________ Finish time: ____________ Date: ____________ Movements: ____________ Start time: ____________ Finish time: ____________ Document Released: 03/12/2006 Document Revised: 01/28/2012 Document Reviewed: 12/08/2011 [employer] Patient Information 2014 [employer]  

## 2013-05-11 NOTE — MAU Note (Signed)
uc's since 0845 this a.m., became more intense around 1030, lower back & abd pain.  Denies bleeding or LOF.

## 2013-05-13 ENCOUNTER — Encounter (HOSPITAL_COMMUNITY): Payer: Self-pay | Admitting: *Deleted

## 2013-05-13 ENCOUNTER — Telehealth (HOSPITAL_COMMUNITY): Payer: Self-pay | Admitting: *Deleted

## 2013-05-13 LAB — OB RESULTS CONSOLE GBS: GBS: NEGATIVE

## 2013-05-13 NOTE — Telephone Encounter (Signed)
Preadmission screen  

## 2013-05-18 ENCOUNTER — Inpatient Hospital Stay (HOSPITAL_COMMUNITY)
Admission: RE | Admit: 2013-05-18 | Discharge: 2013-05-21 | DRG: 775 | Disposition: A | Discharge status: Home / Self Care | Payer: BC Managed Care – PPO | Source: Ambulatory Visit | Attending: Obstetrics and Gynecology | Admitting: Obstetrics and Gynecology

## 2013-05-18 DIAGNOSIS — O09529 Supervision of elderly multigravida, unspecified trimester: Secondary | ICD-10-CM | POA: Diagnosis present

## 2013-05-18 DIAGNOSIS — O26619 Liver and biliary tract disorders in pregnancy, unspecified trimester: Principal | ICD-10-CM | POA: Diagnosis present

## 2013-05-18 DIAGNOSIS — K838 Other specified diseases of biliary tract: Secondary | ICD-10-CM | POA: Diagnosis present

## 2013-05-18 DIAGNOSIS — K8020 Calculus of gallbladder without cholecystitis without obstruction: Secondary | ICD-10-CM | POA: Diagnosis present

## 2013-05-18 LAB — CBC
HCT: 35.6 % — ABNORMAL LOW (ref 36.0–46.0)
Hemoglobin: 11.7 g/dL — ABNORMAL LOW (ref 12.0–15.0)
MCH: 28.7 pg (ref 26.0–34.0)
MCHC: 32.9 g/dL (ref 30.0–36.0)
MCV: 87.5 fL (ref 78.0–100.0)
Platelets: 196 10*3/uL (ref 150–400)
RBC: 4.07 MIL/uL (ref 3.87–5.11)
RDW: 13.4 % (ref 11.5–15.5)
WBC: 8 10*3/uL (ref 4.0–10.5)

## 2013-05-18 MED ORDER — ONDANSETRON HCL 4 MG/2ML IJ SOLN
4.0000 mg | Freq: Four times a day (QID) | INTRAMUSCULAR | Status: DC | PRN
Start: 2013-05-18 — End: 2013-05-20

## 2013-05-18 MED ORDER — LACTATED RINGERS IV SOLN: 500.0000 mL | INTRAVENOUS | Status: DC | PRN

## 2013-05-18 MED ORDER — CITRIC ACID-SODIUM CITRATE 334-500 MG/5ML PO SOLN: 30.0000 mL | ORAL | Status: DC | PRN

## 2013-05-18 MED ORDER — FLEET ENEMA 7-19 GM/118ML RE ENEM: 1.0000 | ENEMA | Freq: Every day | RECTAL | Status: DC | PRN

## 2013-05-18 MED ORDER — MISOPROSTOL 25 MCG QUARTER TABLET
25.0000 ug | ORAL_TABLET | ORAL | Status: DC | PRN
  Administered 2013-05-18 – 2013-05-19 (×3): 25 ug via VAGINAL
  Filled 2013-05-18 (×3): qty 0.25
  Filled 2013-05-18: qty 1

## 2013-05-18 MED ORDER — ZOLPIDEM TARTRATE 5 MG PO TABS: 5.0000 mg | ORAL_TABLET | Freq: Every evening | ORAL | Status: DC | PRN

## 2013-05-18 MED ORDER — IBUPROFEN 600 MG PO TABS
600.0000 mg | ORAL_TABLET | Freq: Four times a day (QID) | ORAL | Status: DC | PRN
  Administered 2013-05-20: 600 mg via ORAL
  Filled 2013-05-18: qty 1

## 2013-05-18 MED ORDER — OXYTOCIN BOLUS FROM INFUSION
500.0000 mL | INTRAVENOUS | Status: DC
  Administered 2013-05-20: 500 mL via INTRAVENOUS

## 2013-05-18 MED ORDER — TERBUTALINE SULFATE 1 MG/ML IJ SOLN: 0.2500 mg | Freq: Once | INTRAMUSCULAR | Status: AC | PRN

## 2013-05-18 MED ORDER — LACTATED RINGERS IV SOLN
INTRAVENOUS | Status: DC
  Administered 2013-05-18 – 2013-05-20 (×5): via INTRAVENOUS

## 2013-05-18 MED ORDER — URSODIOL 300 MG PO CAPS
300.0000 mg | ORAL_CAPSULE | Freq: Two times a day (BID) | ORAL | Status: AC
  Administered 2013-05-18 – 2013-05-19 (×2): 300 mg via ORAL
  Filled 2013-05-18 (×2): qty 1

## 2013-05-18 MED ORDER — OXYTOCIN 40 UNITS IN LACTATED RINGERS INFUSION - SIMPLE MED
62.5000 mL/h | INTRAVENOUS | Status: DC
  Filled 2013-05-18: qty 1000

## 2013-05-18 MED ORDER — LIDOCAINE HCL (PF) 1 % IJ SOLN
30.0000 mL | INTRAMUSCULAR | Status: DC | PRN
  Filled 2013-05-18: qty 30

## 2013-05-18 MED ORDER — OXYCODONE-ACETAMINOPHEN 5-325 MG PO TABS: 1.0000 | ORAL_TABLET | ORAL | Status: DC | PRN

## 2013-05-18 MED ORDER — ACETAMINOPHEN 325 MG PO TABS: 650.0000 mg | ORAL_TABLET | ORAL | Status: DC | PRN

## 2013-05-19 ENCOUNTER — Inpatient Hospital Stay (HOSPITAL_COMMUNITY): Payer: BC Managed Care – PPO | Admitting: Anesthesiology

## 2013-05-19 ENCOUNTER — Encounter (HOSPITAL_COMMUNITY): Payer: BC Managed Care – PPO | Admitting: Anesthesiology

## 2013-05-19 LAB — COMPREHENSIVE METABOLIC PANEL
ALT: 38 U/L — ABNORMAL HIGH (ref 0–35)
AST: 29 U/L (ref 0–37)
Albumin: 2.5 g/dL — ABNORMAL LOW (ref 3.5–5.2)
Alkaline Phosphatase: 138 U/L — ABNORMAL HIGH (ref 39–117)
BUN: 4 mg/dL — ABNORMAL LOW (ref 6–23)
CO2: 23 mEq/L (ref 19–32)
Calcium: 8.9 mg/dL (ref 8.4–10.5)
Chloride: 101 mEq/L (ref 96–112)
Creatinine, Ser: 0.45 mg/dL — ABNORMAL LOW (ref 0.50–1.10)
GFR calc Af Amer: >90 mL/min (ref >90)
GFR calc non Af Amer: >90 mL/min (ref >90)
Glucose, Bld: 117 mg/dL — ABNORMAL HIGH (ref 70–99)
Potassium: 3.9 mEq/L (ref 3.7–5.3)
Sodium: 137 mEq/L (ref 137–147)
Total Bilirubin: <0.2 mg/dL — ABNORMAL LOW (ref 0.3–1.2)
Total Protein: 6 g/dL (ref 6.0–8.3)

## 2013-05-19 LAB — RPR: RPR Ser Ql: NONREACTIVE

## 2013-05-19 MED ORDER — FENTANYL 2.5 MCG/ML BUPIVACAINE 1/10 % EPIDURAL INFUSION (WH - ANES)
14.0000 mL/h | INTRAMUSCULAR | Status: DC | PRN
  Administered 2013-05-19: 14 mL/h via EPIDURAL
  Filled 2013-05-19: qty 125

## 2013-05-19 MED ORDER — EPHEDRINE 5 MG/ML INJ
10.0000 mg | INTRAVENOUS | Status: DC | PRN
  Filled 2013-05-19: qty 2

## 2013-05-19 MED ORDER — PHENYLEPHRINE 40 MCG/ML (10ML) SYRINGE FOR IV PUSH (FOR BLOOD PRESSURE SUPPORT)
80.0000 ug | PREFILLED_SYRINGE | INTRAVENOUS | Status: DC | PRN
  Filled 2013-05-19: qty 10
  Filled 2013-05-19: qty 2

## 2013-05-19 MED ORDER — LIDOCAINE HCL (PF) 1 % IJ SOLN
INTRAMUSCULAR | Status: DC | PRN
  Administered 2013-05-19 (×4): 4 mL

## 2013-05-19 MED ORDER — BUTORPHANOL TARTRATE 1 MG/ML IJ SOLN: 1.0000 mg | INTRAMUSCULAR | Status: DC | PRN

## 2013-05-19 MED ORDER — TERBUTALINE SULFATE 1 MG/ML IJ SOLN: 0.2500 mg | Freq: Once | INTRAMUSCULAR | Status: AC | PRN

## 2013-05-19 MED ORDER — EPHEDRINE 5 MG/ML INJ
10.0000 mg | INTRAVENOUS | Status: DC | PRN
  Filled 2013-05-19: qty 4
  Filled 2013-05-19: qty 2

## 2013-05-19 MED ORDER — LACTATED RINGERS IV SOLN
500.0000 mL | Freq: Once | INTRAVENOUS | Status: AC
  Administered 2013-05-19: 500 mL via INTRAVENOUS

## 2013-05-19 MED ORDER — DIPHENHYDRAMINE HCL 50 MG/ML IJ SOLN: 12.5000 mg | INTRAMUSCULAR | Status: DC | PRN

## 2013-05-19 MED ORDER — PHENYLEPHRINE 40 MCG/ML (10ML) SYRINGE FOR IV PUSH (FOR BLOOD PRESSURE SUPPORT)
80.0000 ug | PREFILLED_SYRINGE | INTRAVENOUS | Status: DC | PRN
  Filled 2013-05-19: qty 2

## 2013-05-19 MED ORDER — OXYTOCIN 40 UNITS IN LACTATED RINGERS INFUSION - SIMPLE MED
1.0000 m[IU]/min | INTRAVENOUS | Status: DC
  Administered 2013-05-19: 2 m[IU]/min via INTRAVENOUS

## 2013-05-19 NOTE — Progress Notes (Signed)
Pt having ctxes- no epidural.  FHTs 120s, gstv, NST R  SVE 3/70/-2, AROM clear  Continue

## 2013-05-19 NOTE — H&P (Signed)
33 y.o. [redacted]w[redacted]d  G2P1001 comes in for induction secondary cholestasis of pregnancy.  Otherwise has good fetal movement and no bleeding.  Past Medical History  Diagnosis Date  . Hypothyroidism   . Cholestasis     Past Surgical History  Procedure Laterality Date  . No past surgeries      OB History  Gravida Para Term Preterm AB SAB TAB Ectopic Multiple Living  2 1 1       1     # Outcome Date GA Lbr Len/2nd Weight Sex Delivery Anes PTL Lv  2 CUR           1 TRM 2005 [redacted]w[redacted]d  3.232 kg (7 lb 2 oz) M SVD   Y      History   Social History  . Marital Status: Married    Spouse Name: N/A    Number of Children: N/A  . Years of Education: N/A   Occupational History  . Not on file.   Social History Main Topics  . Smoking status: Never Smoker   . Smokeless tobacco: Not on file  . Alcohol Use: No  . Drug Use: No  . Sexual Activity: Yes   Other Topics Concern  . Not on file   Social History Narrative  . No narrative on file   Review of patient's allergies indicates no known allergies.    Prenatal Transfer Tool  Maternal Diabetes: No Genetic Screening: Normal_ Amnio 46 XX Maternal Ultrasounds/Referrals: Normal Fetal Ultrasounds or other Referrals:  None Maternal Substance Abuse:  No Significant Maternal Medications:  None Significant Maternal Lab Results: None  Other PNC: uncomplicated.    Filed Vitals:   05/19/13 0732  BP: 120/67  Pulse: 70  Temp: 98.1 F (36.7 C)  Resp: 16     Lungs/Cor:  NAD  Abdomen:  soft, gravid Ex:  no cords, erythema SVE:  C//th/high FHTs:  120, good STV, NST R Toco:  q5   A/P   Induction for cholestasis of pregnancy.  Will recheck CMET.  GBS neg.  Claris Pech A

## 2013-05-19 NOTE — Anesthesia Preprocedure Evaluation (Signed)
Anesthesia Evaluation  Patient identified by MRN, date of birth, ID band Patient awake    Reviewed: Allergy & Precautions, H&P , NPO status , Patient's Chart, lab work & pertinent test results, reviewed documented beta blocker date and time   History of Anesthesia Complications Negative for: history of anesthetic complications  Airway Mallampati: III TM Distance: >3 FB Neck ROM: full    Dental  (+) Teeth Intact   Pulmonary neg pulmonary ROS,  breath sounds clear to auscultation        Cardiovascular negative cardio ROS  Rhythm:regular Rate:Normal     Neuro/Psych negative neurological ROS  negative psych ROS   GI/Hepatic negative GI ROS, Cholestasis   Endo/Other  BMI 34  Renal/GU negative Renal ROS     Musculoskeletal   Abdominal   Peds  Hematology negative hematology ROS (+)   Anesthesia Other Findings   Reproductive/Obstetrics (+) Pregnancy                           Anesthesia Physical Anesthesia Plan  ASA: II  Anesthesia Plan: Epidural   Post-op Pain Management:    Induction:   Airway Management Planned:   Additional Equipment:   Intra-op Plan:   Post-operative Plan:   Informed Consent: I have reviewed the patients History and Physical, chart, labs and discussed the procedure including the risks, benefits and alternatives for the proposed anesthesia with the patient or authorized representative who has indicated his/her understanding and acceptance.     Plan Discussed with:   Anesthesia Plan Comments:         Anesthesia Quick Evaluation

## 2013-05-19 NOTE — Anesthesia Procedure Notes (Signed)
Epidural Patient location during procedure: OB Start time: 05/19/2013 9:02 PM  Staffing Performed by: anesthesiologist   Preanesthetic Checklist Completed: patient identified, site marked, surgical consent, pre-op evaluation, timeout performed, IV checked, risks and benefits discussed and monitors and equipment checked  Epidural Patient position: sitting Prep: site prepped and draped and DuraPrep Patient monitoring: continuous pulse ox and blood pressure Approach: midline Injection technique: LOR air  Needle:  Needle type: Tuohy  Needle gauge: 17 G Needle length: 9 cm and 9 Needle insertion depth: 5.5 cm Catheter type: closed end flexible Catheter size: 19 Gauge Catheter at skin depth: 10 and 10.5 cm Test dose: negative  Assessment Events: blood not aspirated, injection not painful, no injection resistance, negative IV test and no paresthesia  Additional Notes Discussed risk of headache, infection, bleeding, nerve injury and failed or incomplete block.  Patient voices understanding and wishes to proceed.  Epidural placed easily on first attempt.  No paresthesia.  Patient tolerated procedure well with no apparent complications.  Charlton Haws, MDReason for block:procedure for pain

## 2013-05-20 ENCOUNTER — Encounter (HOSPITAL_COMMUNITY): Payer: Self-pay

## 2013-05-20 LAB — CBC
HCT: 33.5 % — ABNORMAL LOW (ref 36.0–46.0)
Hemoglobin: 10.8 g/dL — ABNORMAL LOW (ref 12.0–15.0)
MCH: 28.1 pg (ref 26.0–34.0)
MCHC: 32.2 g/dL (ref 30.0–36.0)
MCV: 87 fL (ref 78.0–100.0)
Platelets: 169 10*3/uL (ref 150–400)
RBC: 3.85 MIL/uL — ABNORMAL LOW (ref 3.87–5.11)
RDW: 13.2 % (ref 11.5–15.5)
WBC: 14.5 10*3/uL — ABNORMAL HIGH (ref 4.0–10.5)

## 2013-05-20 MED ORDER — FERROUS SULFATE 325 (65 FE) MG PO TABS
325.0000 mg | ORAL_TABLET | Freq: Two times a day (BID) | ORAL | Status: DC
  Administered 2013-05-20 – 2013-05-21 (×3): 325 mg via ORAL
  Filled 2013-05-20 (×3): qty 1

## 2013-05-20 MED ORDER — TETANUS-DIPHTH-ACELL PERTUSSIS 5-2.5-18.5 LF-MCG/0.5 IM SUSP: 0.5000 mL | Freq: Once | INTRAMUSCULAR | Status: DC

## 2013-05-20 MED ORDER — INFLUENZA VAC SPLIT QUAD 0.5 ML IM SUSP
0.5000 mL | INTRAMUSCULAR | Status: AC
  Administered 2013-05-21: 0.5 mL via INTRAMUSCULAR
  Filled 2013-05-20: qty 0.5

## 2013-05-20 MED ORDER — SODIUM CHLORIDE 0.9 % IJ SOLN: 3.0000 mL | INTRAMUSCULAR | Status: DC | PRN

## 2013-05-20 MED ORDER — LANOLIN HYDROUS EX OINT: TOPICAL_OINTMENT | CUTANEOUS | Status: DC | PRN

## 2013-05-20 MED ORDER — OXYCODONE-ACETAMINOPHEN 5-325 MG PO TABS: 1.0000 | ORAL_TABLET | ORAL | Status: DC | PRN

## 2013-05-20 MED ORDER — ZOLPIDEM TARTRATE 5 MG PO TABS: 5.0000 mg | ORAL_TABLET | Freq: Every evening | ORAL | Status: DC | PRN

## 2013-05-20 MED ORDER — DIPHENHYDRAMINE HCL 25 MG PO CAPS: 25.0000 mg | ORAL_CAPSULE | Freq: Four times a day (QID) | ORAL | Status: DC | PRN

## 2013-05-20 MED ORDER — HYDROXYZINE HCL 25 MG PO TABS
25.0000 mg | ORAL_TABLET | Freq: Three times a day (TID) | ORAL | Status: DC | PRN
  Filled 2013-05-20: qty 1

## 2013-05-20 MED ORDER — MAGNESIUM HYDROXIDE 400 MG/5ML PO SUSP: 30.0000 mL | ORAL | Status: DC | PRN

## 2013-05-20 MED ORDER — SIMETHICONE 80 MG PO CHEW: 80.0000 mg | CHEWABLE_TABLET | ORAL | Status: DC | PRN

## 2013-05-20 MED ORDER — MEASLES, MUMPS & RUBELLA VAC ~~LOC~~ INJ
0.5000 mL | INJECTION | Freq: Once | SUBCUTANEOUS | Status: DC
  Filled 2013-05-20: qty 0.5

## 2013-05-20 MED ORDER — URSODIOL 500 MG PO TABS
500.0000 mg | ORAL_TABLET | Freq: Two times a day (BID) | ORAL | Status: DC
  Administered 2013-05-20 (×2): 500 mg via ORAL
  Filled 2013-05-20 (×5): qty 1

## 2013-05-20 MED ORDER — BENZOCAINE-MENTHOL 20-0.5 % EX AERO: 1.0000 "application " | INHALATION_SPRAY | CUTANEOUS | Status: DC | PRN

## 2013-05-20 MED ORDER — SENNOSIDES-DOCUSATE SODIUM 8.6-50 MG PO TABS
2.0000 | ORAL_TABLET | ORAL | Status: DC
  Administered 2013-05-20: 2 via ORAL
  Filled 2013-05-20: qty 2

## 2013-05-20 MED ORDER — METHYLERGONOVINE MALEATE 0.2 MG/ML IJ SOLN: 0.2000 mg | INTRAMUSCULAR | Status: DC | PRN

## 2013-05-20 MED ORDER — SODIUM CHLORIDE 0.9 % IJ SOLN: 3.0000 mL | Freq: Two times a day (BID) | INTRAMUSCULAR | Status: DC

## 2013-05-20 MED ORDER — METHYLERGONOVINE MALEATE 0.2 MG PO TABS: 0.2000 mg | ORAL_TABLET | ORAL | Status: DC | PRN

## 2013-05-20 MED ORDER — SODIUM CHLORIDE 0.9 % IV SOLN: 250.0000 mL | INTRAVENOUS | Status: DC | PRN

## 2013-05-20 MED ORDER — ONDANSETRON HCL 4 MG/2ML IJ SOLN
4.0000 mg | INTRAMUSCULAR | Status: DC | PRN
Start: 2013-05-20 — End: 2013-05-21

## 2013-05-20 MED ORDER — PRENATAL MULTIVITAMIN CH
1.0000 | ORAL_TABLET | Freq: Every day | ORAL | Status: DC
  Administered 2013-05-20: 1 via ORAL
  Filled 2013-05-20: qty 1

## 2013-05-20 MED ORDER — IBUPROFEN 800 MG PO TABS
800.0000 mg | ORAL_TABLET | Freq: Three times a day (TID) | ORAL | Status: DC
  Administered 2013-05-20 – 2013-05-21 (×4): 800 mg via ORAL
  Filled 2013-05-20 (×4): qty 1

## 2013-05-20 MED ORDER — ONDANSETRON HCL 4 MG PO TABS: 4.0000 mg | ORAL_TABLET | ORAL | Status: DC | PRN

## 2013-05-20 MED ORDER — DIBUCAINE 1 % RE OINT: 1.0000 "application " | TOPICAL_OINTMENT | RECTAL | Status: DC | PRN

## 2013-05-20 MED ORDER — WITCH HAZEL-GLYCERIN EX PADS
1.0000 | MEDICATED_PAD | CUTANEOUS | Status: DC | PRN
Start: 2013-05-20 — End: 2013-05-21

## 2013-05-20 NOTE — Progress Notes (Signed)
Post Partum Day 0-1 Subjective: no complaints, up ad lib, voiding, tolerating PO and + flatus  Objective: Blood pressure 138/66, pulse 70, temperature 97.7 F (36.5 C), temperature source Oral, resp. rate 20, height 5\' 6"  (1.676 m), weight 95.709 kg (211 lb), last menstrual period 08/28/2012, SpO2 96.00%, unknown if currently breastfeeding.  Physical Exam:  General: alert, cooperative and no distress Lochia: appropriate Uterine Fundus: firm DVT Evaluation: No evidence of DVT seen on physical exam. Negative Homan's sign. No significant calf/ankle edema.   Recent Labs  05/18/13 2000 05/20/13 0704  HGB 11.7* 10.8*  HCT 35.6* 33.5*    Assessment/Plan: Plan for discharge tomorrow Labs reviewed CMP showed decreasing LFTs.  Patient w/o pruritus doing well. Will d/c Ursodiol   LOS: 2 days   Alexandra Posadas, [place] 05/20/2013, 9:07 AM

## 2013-05-20 NOTE — Anesthesia Postprocedure Evaluation (Signed)
  Anesthesia Post-op Note  Patient: Virginia [name]  Procedure(s) Performed: * No procedures listed *  Patient Location: Mother/Baby  Anesthesia Type:Epidural  Level of Consciousness: awake and alert   Airway and Oxygen Therapy: Patient Spontanous Breathing  Post-op Pain: mild  Post-op Assessment: Patient's Cardiovascular Status Stable, Respiratory Function Stable, No signs of Nausea or vomiting, Pain level controlled, No headache, No residual numbness and No residual motor weakness  Post-op Vital Signs: stable  Complications: No apparent anesthesia complications

## 2013-05-20 NOTE — Lactation Note (Signed)
This note was copied from the chart of Virginia [name]. Lactation Consultation Note Initial visit at 16 hours of age.  Mom reports a few latches and sucks, but not more than 5 minutes.  Mom has erect/ compressible nipples with colostrum expressible.  Assisted to cross cradle hold.  Baby latched well with assist and buries nose to gain a deep latch into mom's large breast.  Suggested position option to help this.  Baby has several strong suckling burst with swallows visible.  [facility] LC resources given and discussed.  Discussed feeding frequency and early feeding cues.  Mom is eager to place baby STS with feedings.  Mom to call for assist as needed.  Patient Name: Virginia Kashlynn Kundert OVFIE'P Date: 05/20/2013 Reason for consult: Initial assessment   Maternal Data    Feeding Feeding Type: Breast Fed Length of feed: 5 min  LATCH Score/Interventions Latch: Grasps breast easily, tongue down, lips flanged, rhythmical sucking. Intervention(s): Breast compression;Assist with latch;Adjust position  Audible Swallowing: A few with stimulation Intervention(s): Skin to skin;Hand expression  Type of Nipple: Everted at rest and after stimulation  Comfort (Breast/Nipple): Soft / non-tender     Hold (Positioning): Assistance needed to correctly position infant at breast and maintain latch. Intervention(s): Position options;Skin to skin;Support Pillows;Breastfeeding basics reviewed  LATCH Score: 8  Lactation Tools Discussed/Used     Consult Status Consult Status: Follow-up Date: 05/21/13 Follow-up type: In-patient    [name] 05/20/2013, 4:49 PM

## 2013-05-21 ENCOUNTER — Encounter (HOSPITAL_COMMUNITY): Payer: Self-pay

## 2013-05-21 MED ORDER — IBUPROFEN 800 MG PO TABS: 800.0000 mg | ORAL_TABLET | Freq: Three times a day (TID) | ORAL | Status: DC

## 2013-05-21 NOTE — Discharge Summary (Signed)
Obstetric Discharge Summary Reason for Admission: induction of labor Prenatal Procedures: NST and ultrasound Intrapartum Procedures: nothing Postpartum Procedures: none Complications-Operative and Postpartum: none Hemoglobin  Date Value Ref Range Status  05/20/2013 10.8* 12.0 - 15.0 g/dL Final     HCT  Date Value Ref Range Status  05/20/2013 33.5* 36.0 - 46.0 % Final    Physical Exam:  General: alert, cooperative and no distress Lochia: appropriate Uterine Fundus: firm DVT Evaluation: No evidence of DVT seen on physical exam. No cords or calf tenderness. No significant calf/ankle edema. Discharge Diagnoses: Term Pregnancy-delivered  Discharge Information: Date: 05/21/2013 Activity: pelvic rest Diet: routine Medications: PNV and Ibuprofen Condition: improved Instructions: refer to practice specific booklet Discharge to: home   Newborn Data: Live born female  Birth Weight: 6 lb 14.6 oz (3135 g) APGAR: 9, 9  Home with mother.  Saje Gallop, [place] 05/21/2013, 8:54 AM

## 2013-05-21 NOTE — Progress Notes (Addendum)
Post Partum Day 2 Subjective: no complaints, up ad lib, voiding, tolerating PO and + flatus  Objective: Blood pressure 108/66, pulse 89, temperature 97.3 F (36.3 C), temperature source Oral, resp. rate 18, height 5\' 6"  (1.676 m), weight 95.709 kg (211 lb), last menstrual period 08/28/2012, SpO2 96.00%, unknown if currently breastfeeding.  Physical Exam:  General: alert, cooperative and no distress Lochia: appropriate Uterine Fundus: firm DVT Evaluation: No evidence of DVT seen on physical exam. No cords or calf tenderness. No significant calf/ankle edema.   Recent Labs  05/18/13 2000 05/20/13 0704  HGB 11.7* 10.8*  HCT 35.6* 33.5*    Assessment/Plan: Discharge home, Breastfeeding and Contraception will address at post partum visit   LOS: 3 days   [place], [place] 05/21/2013, 8:46 AM

## 2013-05-21 NOTE — Discharge Instructions (Signed)

## 2013-05-21 NOTE — Lactation Note (Addendum)
This note was copied from the chart of Virginia [name]. Lactation Consultation Note Follow up consult:  Baby Virginia 75 hours old, 37 weeks, 1 day gestation. Reviewed hand expression and breast massage.  Small drop of colostrum expressed. Set up DEBP and encouraged mother to post pump 4-6 times a day for 15 min. both breasts. Reviewed volume guidelines and spoon feeding, milk storage and pump cleaning guidelines. Mother has medela pump at home. Mother placed baby in cradle hold, leaning in toward baby.  Provided support pillows and encouraged mother to sit back and bring baby to her. Baby's head was bobbing off and on breast. Repostioned baby in football hold and had mother compress her breast.  Active sucking observed. Encouraged mother to keep massaging breasts to keep baby active at the breast and call LC if she needs further assistance.   Patient Name: Virginia Phillis Thackeray QQIWL'N Date: 05/21/2013 Reason for consult: Follow-up assessment   Maternal Data    Feeding Feeding Type: Breast Fed  LATCH Score/Interventions Latch: Repeated attempts needed to sustain latch, nipple held in mouth throughout feeding, stimulation needed to elicit sucking reflex. Intervention(s): Adjust position;Assist with latch;Breast massage;Breast compression  Audible Swallowing: A few with stimulation Intervention(s): Hand expression  Type of Nipple: Everted at rest and after stimulation  Comfort (Breast/Nipple): Soft / non-tender     Hold (Positioning): Assistance needed to correctly position infant at breast and maintain latch.  LATCH Score: 7  Lactation Tools Discussed/Used Tools: Pump Breast pump type: Double-Electric Breast Pump Pump Review: Setup, frequency, and cleaning;Milk Storage Initiated by:: Vivianne Master RN Date initiated:: 05/21/13   Consult Status Consult Status: Complete    Vivianne Master [place] 05/21/2013, 10:15 AM

## 2013-12-26 ENCOUNTER — Encounter (HOSPITAL_COMMUNITY): Payer: Self-pay

## 2014-07-16 ENCOUNTER — Encounter (HOSPITAL_COMMUNITY): Payer: Self-pay | Admitting: Emergency Medicine

## 2014-07-16 ENCOUNTER — Emergency Department (HOSPITAL_COMMUNITY): Payer: BLUE CROSS/BLUE SHIELD

## 2014-07-16 ENCOUNTER — Emergency Department (HOSPITAL_COMMUNITY)
Admission: EM | Admit: 2014-07-16 | Discharge: 2014-07-17 | Disposition: A | Discharge status: Home / Self Care | Payer: BLUE CROSS/BLUE SHIELD | Attending: Emergency Medicine | Admitting: Emergency Medicine

## 2014-07-16 DIAGNOSIS — Z8719 Personal history of other diseases of the digestive system: Secondary | ICD-10-CM | POA: Diagnosis not present

## 2014-07-16 DIAGNOSIS — R079 Chest pain, unspecified: Secondary | ICD-10-CM | POA: Insufficient documentation

## 2014-07-16 DIAGNOSIS — Z791 Long term (current) use of non-steroidal anti-inflammatories (NSAID): Secondary | ICD-10-CM | POA: Diagnosis not present

## 2014-07-16 DIAGNOSIS — Z8639 Personal history of other endocrine, nutritional and metabolic disease: Secondary | ICD-10-CM | POA: Diagnosis not present

## 2014-07-16 LAB — BASIC METABOLIC PANEL
Anion gap: 7 (ref 5–15)
BUN: 11 mg/dL (ref 6–20)
CO2: 26 mmol/L (ref 22–32)
Calcium: 9.3 mg/dL (ref 8.9–10.3)
Chloride: 104 mmol/L (ref 101–111)
Creatinine, Ser: 0.7 mg/dL (ref 0.44–1.00)
GFR calc Af Amer: >60 mL/min (ref >60)
GFR calc non Af Amer: >60 mL/min (ref >60)
Glucose, Bld: 97 mg/dL (ref 65–99)
Potassium: 3.6 mmol/L (ref 3.5–5.1)
Sodium: 137 mmol/L (ref 135–145)

## 2014-07-16 LAB — I-STAT TROPONIN, ED: Troponin i, poc: 0 ng/mL (ref 0.00–0.08)

## 2014-07-16 LAB — CBC
HCT: 37.5 % (ref 36.0–46.0)
Hemoglobin: 12 g/dL (ref 12.0–15.0)
MCH: 27.1 pg (ref 26.0–34.0)
MCHC: 32 g/dL (ref 30.0–36.0)
MCV: 84.7 fL (ref 78.0–100.0)
Platelets: 210 10*3/uL (ref 150–400)
RBC: 4.43 MIL/uL (ref 3.87–5.11)
RDW: 13.2 % (ref 11.5–15.5)
WBC: 5.9 10*3/uL (ref 4.0–10.5)

## 2014-07-16 LAB — BRAIN NATRIURETIC PEPTIDE: B Natriuretic Peptide: 8.8 pg/mL (ref 0.0–100.0)

## 2014-07-16 NOTE — ED Notes (Signed)
Novella Olive, PA at bedside

## 2014-07-16 NOTE — Discharge Instructions (Signed)
Your caregiver has diagnosed you as having chest pain that is not specific for one problem, but does not require admission.  You are at low risk for an acute heart condition or other serious illness. Chest pain comes from many different causes.  °SEEK IMMEDIATE MEDICAL ATTENTION IF: °You have severe chest pain, especially if the pain is crushing or pressure-like and spreads to the arms, back, neck, or jaw, or if you have sweating, nausea (feeling sick to your stomach), or shortness of breath. THIS IS AN EMERGENCY. Don't wait to see if the pain will go away. Get medical help at once. Call 911 or 0 (operator). DO NOT drive yourself to the hospital.  °Your chest pain gets worse and does not go away with rest.  °You have an attack of chest pain lasting longer than usual, despite rest and treatment with the medications your caregiver has prescribed.  °You wake from sleep with chest pain or shortness of breath.  °You feel dizzy or faint.  °You have chest pain not typical of your usual pain for which you originally saw your caregiver. °Chest Pain (Nonspecific) °It is often hard to give a specific diagnosis for the cause of chest pain. There is always a chance that your pain could be related to something serious, such as a heart attack or a blood clot in the lungs. You need to follow up with your health care provider for further evaluation. °CAUSES  °· Heartburn. °· Pneumonia or bronchitis. °· Anxiety or stress. °· Inflammation around your heart (pericarditis) or lung (pleuritis or pleurisy). °· A blood clot in the lung. °· A collapsed lung (pneumothorax). It can develop suddenly on its own (spontaneous pneumothorax) or from trauma to the chest. °· Shingles infection (herpes zoster virus). °The chest wall is composed of bones, muscles, and cartilage. Any of these can be the source of the pain. °· The bones can be bruised by injury. °· The muscles or cartilage can be strained by coughing or overwork. °· The cartilage can be  affected by inflammation and become sore (costochondritis). °DIAGNOSIS  °Lab tests or other studies may be needed to find the cause of your pain. Your health care provider may have you take a test called an ambulatory electrocardiogram (ECG). An ECG records your heartbeat patterns over a 24-hour period. You may also have other tests, such as: °· Transthoracic echocardiogram (TTE). During echocardiography, sound waves are used to evaluate how blood flows through your heart. °· Transesophageal echocardiogram (TEE). °· Cardiac monitoring. This allows your health care provider to monitor your heart rate and rhythm in real time. °· Holter monitor. This is a portable device that records your heartbeat and can help diagnose heart arrhythmias. It allows your health care provider to track your heart activity for several days, if needed. °· Stress tests by exercise or by giving medicine that makes the heart beat faster. °TREATMENT  °· Treatment depends on what may be causing your chest pain. Treatment may include: °¨ Acid blockers for heartburn. °¨ Anti-inflammatory medicine. °¨ Pain medicine for inflammatory conditions. °¨ Antibiotics if an infection is present. °· You may be advised to change lifestyle habits. This includes stopping smoking and avoiding alcohol, caffeine, and chocolate. °· You may be advised to keep your head raised (elevated) when sleeping. This reduces the chance of acid going backward from your stomach into your esophagus. °Most of the time, nonspecific chest pain will improve within 2-3 days with rest and mild pain medicine.  °HOME CARE INSTRUCTIONS  °·   If antibiotics were prescribed, take them as directed. Finish them even if you start to feel better. °· For the next few days, avoid physical activities that bring on chest pain. Continue physical activities as directed. °· Do not use any tobacco products, including cigarettes, chewing tobacco, or electronic cigarettes. °· Avoid drinking alcohol. °· Only  take medicine as directed by your health care provider. °· Follow your health care provider's suggestions for further testing if your chest pain does not go away. °· Keep any follow-up appointments you made. If you do not go to an appointment, you could develop lasting (chronic) problems with pain. If there is any problem keeping an appointment, call to reschedule. °SEEK MEDICAL CARE IF:  °· Your chest pain does not go away, even after treatment. °· You have a rash with blisters on your chest. °· You have a fever. °SEEK IMMEDIATE MEDICAL CARE IF:  °· You have increased chest pain or pain that spreads to your arm, neck, jaw, back, or abdomen. °· You have shortness of breath. °· You have an increasing cough, or you cough up blood. °· You have severe back or abdominal pain. °· You feel nauseous or vomit. °· You have severe weakness. °· You faint. °· You have chills. °This is an emergency. Do not wait to see if the pain will go away. Get medical help at once. Call your local emergency services (911 in [place] Do not drive yourself to the hospital. °MAKE SURE YOU:  °· Understand these instructions. °· Will watch your condition. °· Will get help right away if you are not doing well or get worse. °Document Released: 11/20/2004 Document Revised: 02/15/2013 Document Reviewed: 09/16/2007 °Exit[employer]® Patient Information ©2015 [employer]. This information is not intended to replace advice given to you by your health care provider. Make sure you discuss any questions you have with your health care provider. ° °

## 2014-07-16 NOTE — ED Notes (Addendum)
Patient states she was resting and was awaken with Chest tightness around 1700. Patient states the pain is intermitted. And denies any pain on arrival. Patient states she has a HX of reflux and does not feel like the same pain. Patient denies any radiation , SOB , N/V . Patient took 324 ASA and was give 1 Nitro with EMS.

## 2014-07-16 NOTE — ED Provider Notes (Signed)
ED ECG REPORT   Date: 07/16/2014  Rate: 67  Rhythm: normal sinus rhythm  QRS Axis: normal  Intervals: normal  ST/T Wave abnormalities: normal  Conduction Disutrbances:none  Narrative Interpretation:   Old EKG Reviewed: none available  I have personally reviewed the EKG tracing and agree with the computerized printout as noted.   Ezequiel Essex, MD 07/16/14 2300

## 2014-07-16 NOTE — ED Provider Notes (Signed)
CSN: [id number]     Arrival date & time 07/16/14  2152 History   First MD Initiated Contact with Patient 07/16/14 2208     Chief Complaint  Patient presents with  . Chest Pain     (Consider location/radiation/quality/duration/timing/severity/associated sxs/prior Treatment) HPI Virginia [name] His a 34 year old female with no significant past medical history, presents emergency Department with chief complaint of chest pain. The patient states that she was awoken form a nap with her 27 y/o daughter this afternoon with retrosternal cp that woke her from sleep. She describes it as lasting seconds, tight, squeezing, aching without radiation, nausea or diaphoresis. The patient states that later that evening her symptoms returned. They were the same, however, they were coming back to back. She became concerned and had her husband call 911. She took nitroglycerin and aspirin per EMS and her symptoms resolved. She denies a history of PE or DVT. She denies shortness of breath, hemoptysis. She is not taking any exogenous estrogens, no recent confinement or surgeries, no unilateral leg swelling. She has a family history of a grandfather who had a massive MI in his 32s and a strong family history of coronary artery disease. She denies history of smoking, hypertension, hypercholesterolemia. She is nursing her 94-year-old but denies any breast tenderness or chest wall pain . Past Medical History  Diagnosis Date  . Hypothyroidism   . Cholestasis    Past Surgical History  Procedure Laterality Date  . No past surgeries     Family History  Problem Relation Age of Onset  . Hypertension Mother   . Hypertension Father    History  Substance Use Topics  . Smoking status: Never Smoker   . Smokeless tobacco: Not on file  . Alcohol Use: No   OB History    Gravida Para Term Preterm AB TAB SAB Ectopic Multiple Living   2 2 2       2      Review of Systems  Ten systems reviewed and are negative for acute  change, except as noted in the HPI.    Allergies  Review of patient's allergies indicates no known allergies.  Home Medications   Prior to Admission medications   Medication Sig Start Date End Date Taking? Authorizing Provider  ibuprofen (ADVIL,MOTRIN) 800 MG tablet Take 1 tablet (800 mg total) by mouth 3 (three) times daily. 05/21/13   Sanjuana Kava, MD  Prenatal Vit-Fe Fumarate-FA (PRENATAL MULTIVITAMIN) TABS tablet Take 1 tablet by mouth at bedtime.    Historical Provider, MD   BP 112/65 mmHg  Pulse 71  Temp(Src) 98.4 F (36.9 C) (Oral)  Resp 24  SpO2 99%  LMP 06/14/2014 Physical Exam  Constitutional: She is oriented to person, place, and time. She appears well-developed and well-nourished. No distress.  HENT:  Head: Normocephalic and atraumatic.  Eyes: Conjunctivae are normal. No scleral icterus.  Neck: Normal range of motion.  Cardiovascular: Normal rate, regular rhythm and normal heart sounds.  Exam reveals no gallop and no friction rub.   No murmur heard. Pulmonary/Chest: Effort normal and breath sounds normal. No respiratory distress. She has no wheezes. She has no rales. She exhibits no tenderness.  Abdominal: Soft. Bowel sounds are normal. She exhibits no distension and no mass. There is no tenderness. There is no guarding.  Neurological: She is alert and oriented to person, place, and time.  Skin: Skin is warm and dry. She is not diaphoretic.  Nursing note and vitals reviewed.   ED Course  Procedures (  including critical care time) Labs Review Labs Reviewed  CBC  BASIC METABOLIC PANEL  BRAIN NATRIURETIC PEPTIDE  I-STAT [place], ED    Imaging Review Dg Chest 2 View  07/16/2014   CLINICAL DATA:  Centralized chest pain beginning tonight.  EXAM: CHEST  2 VIEW  COMPARISON:  None.  FINDINGS: Normal heart size and mediastinal contours. No acute infiltrate or edema. No effusion or pneumothorax. No acute osseous findings.  IMPRESSION: Negative chest.   Electronically  Signed   By: Monte Fantasia M.D.   On: 07/16/2014 22:20     EKG Interpretation None     ED ECG REPORT   Date: 07/16/2014  Rate: 67  Rhythm: normal sinus rhythm  QRS Axis: normal  Intervals: normal  ST/T Wave abnormalities: normal  Conduction Disutrbances:none  Narrative Interpretation:   Old EKG Reviewed: none available  I have personally reviewed the EKG tracing and agree with the computerized printout as noted.  MDM   Final diagnoses:  None     10:55 PM Filed Vitals:   07/16/14 2155 07/16/14 2200  BP: 112/65 123/66  Pulse: 71 73  Temp: 98.4 F (36.9 C)   TempSrc: Oral   Resp: 24 19  SpO2: 99% 99%   Patient with negative troponin. CXR unremarkable. EKG is normal.  Awaiting labs.   11:44 PM Labs are unremarkable. HEART score is 1. Patient is to be discharged with recommendation to follow up with PCP in regards to today's hospital visit. Chest pain is not likely of cardiac or pulmonary etiology d/t presentation, perc negative, VSS, no tracheal deviation, no JVD or new murmur, RRR, breath sounds equal bilaterally, EKG without acute abnormalities, negative troponin, and negative CXR. Pt has been advised to return to the ED is CP becomes exertional, associated with diaphoresis or nausea, radiates to left jaw/arm, worsens or becomes concerning in any way. Pt appears reliable for follow up and is agreeable to discharge.   Margarita Mail, PA-C 07/25/14 [place], MD 07/26/14 [phone]

## 2014-11-03 IMAGING — US US FETAL BPP W/O NONSTRESS
1 series · 11 of 11 positions shown · non-contrast
Comparison: none

[Series 1: us fetal bpp w/o nonstress · non-contrast · 11 acquisitions, 11 frames shown]
[im 1/11]
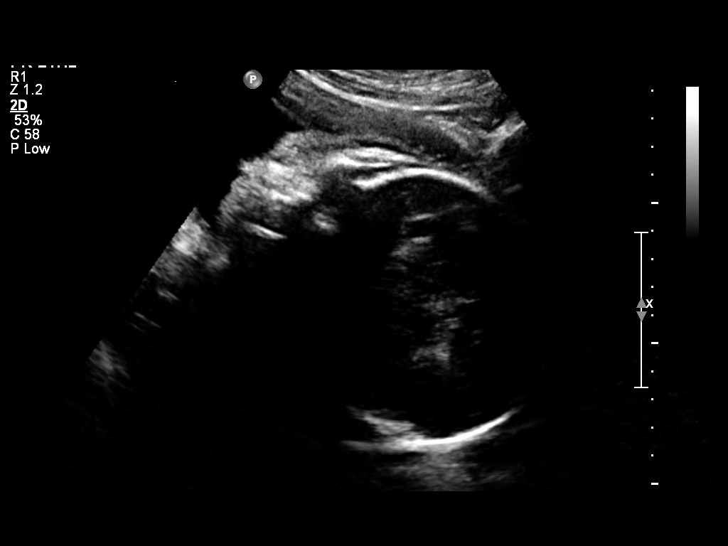
[im 2/11]
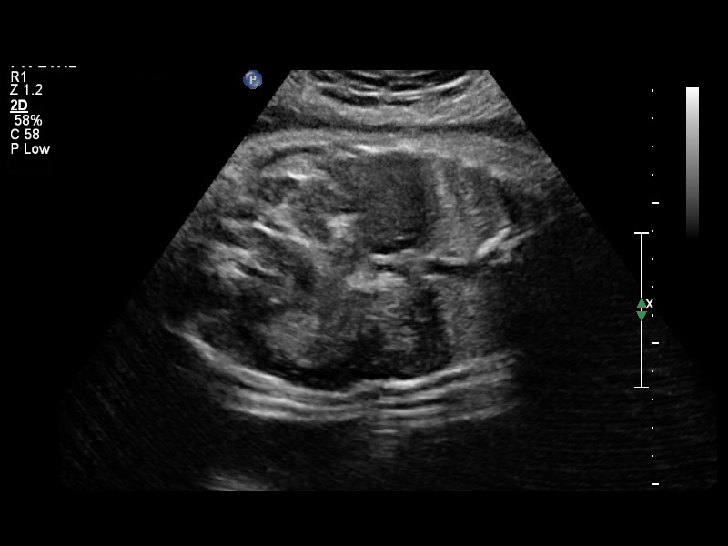
[im 3/11]
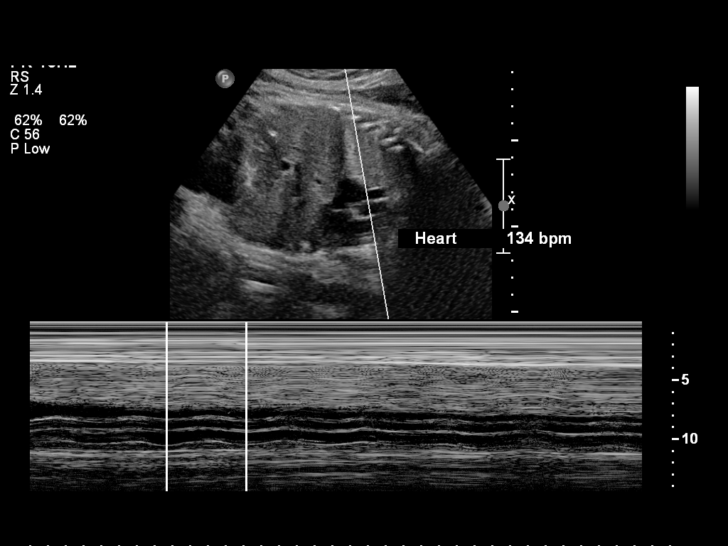
[im 4/11]
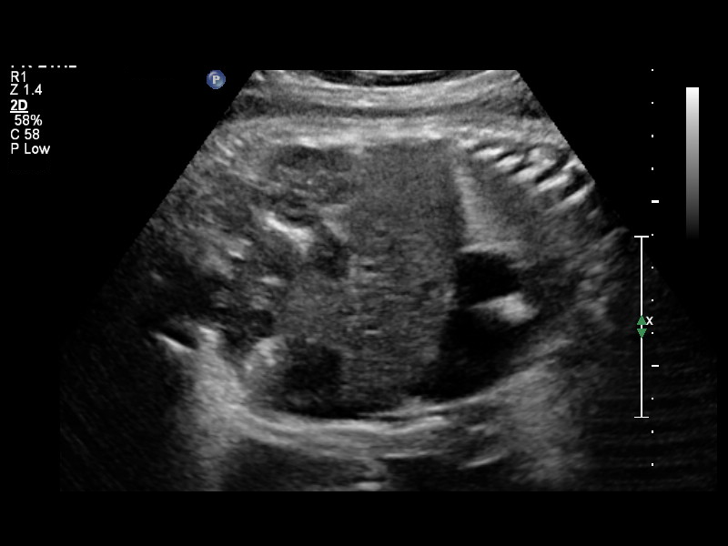
[im 5/11]
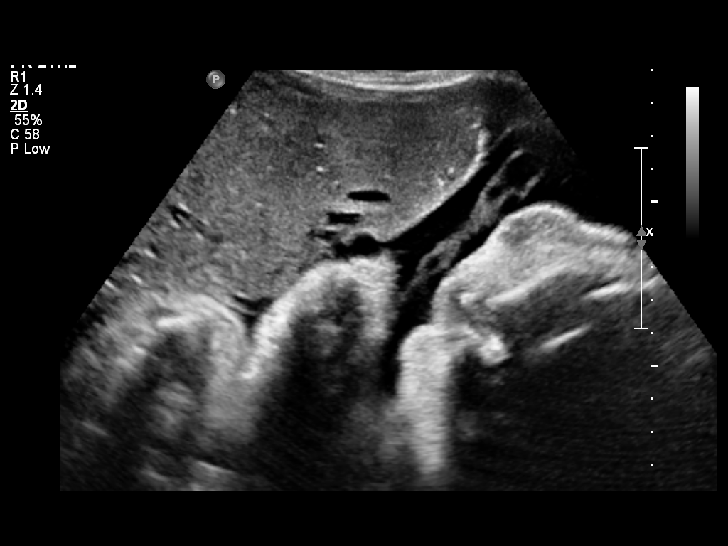
[im 6/11]
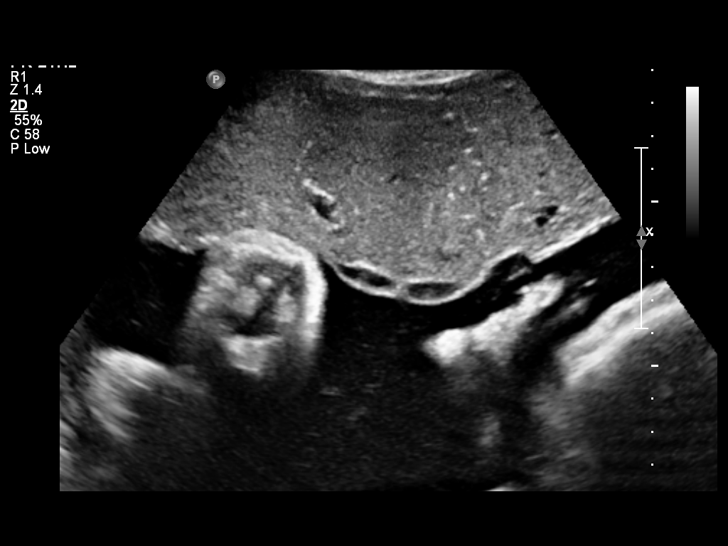
[im 7/11]
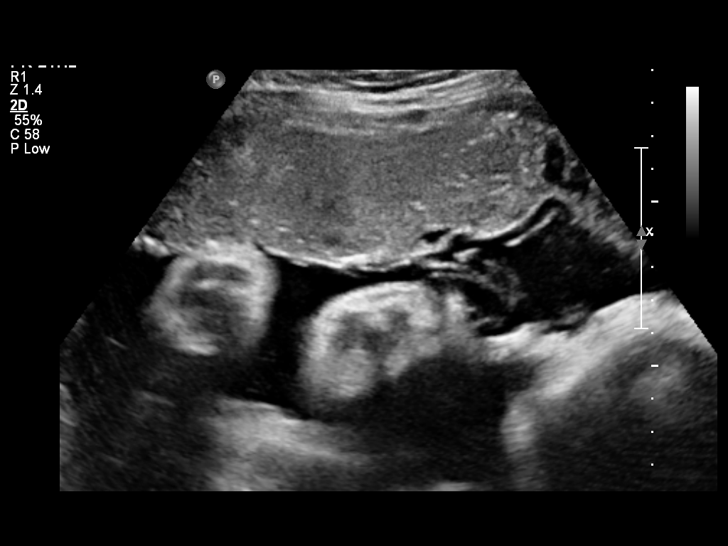
[im 8/11]
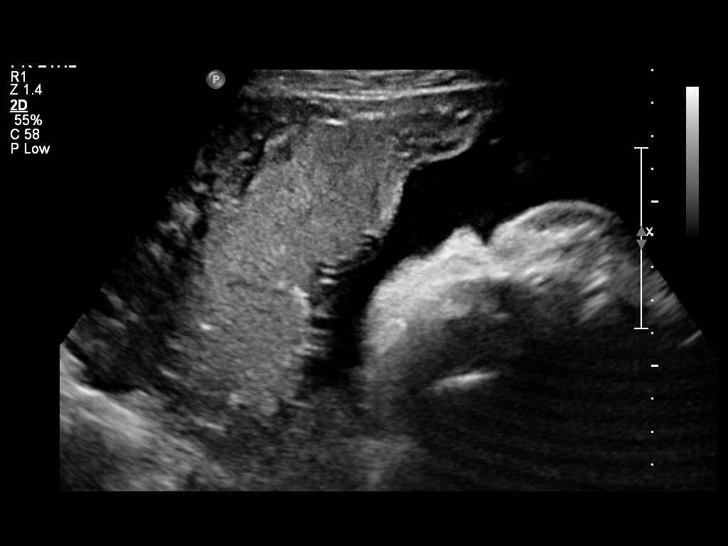
[im 9/11]
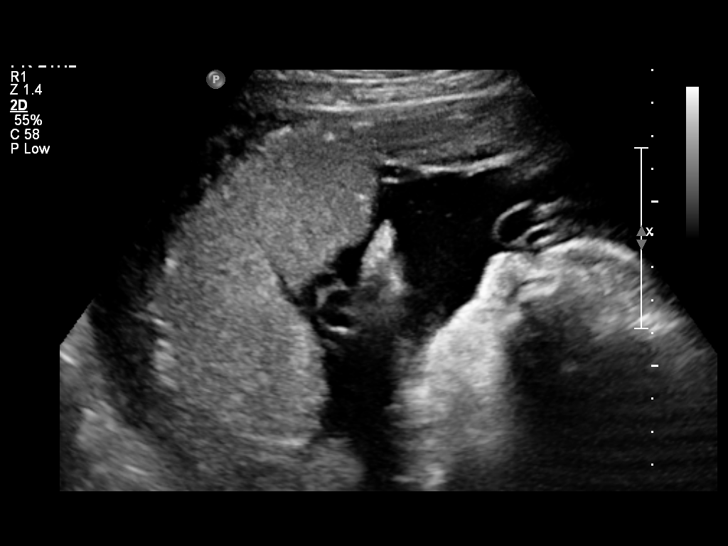
[im 10/11]
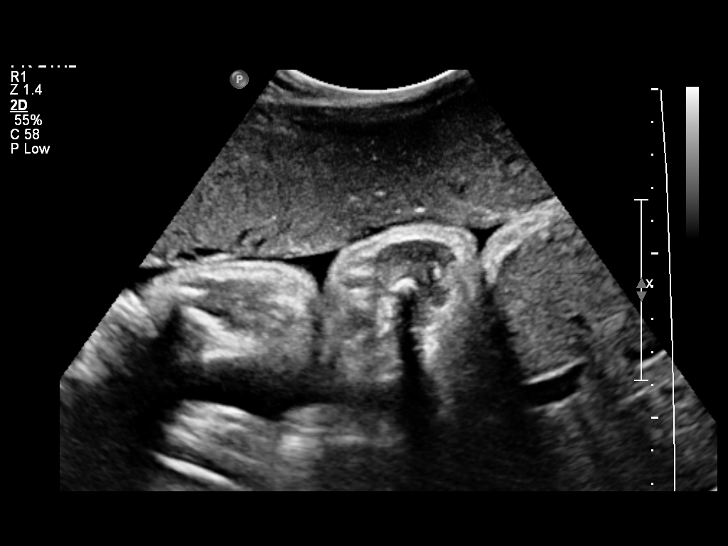
[im 11/11]
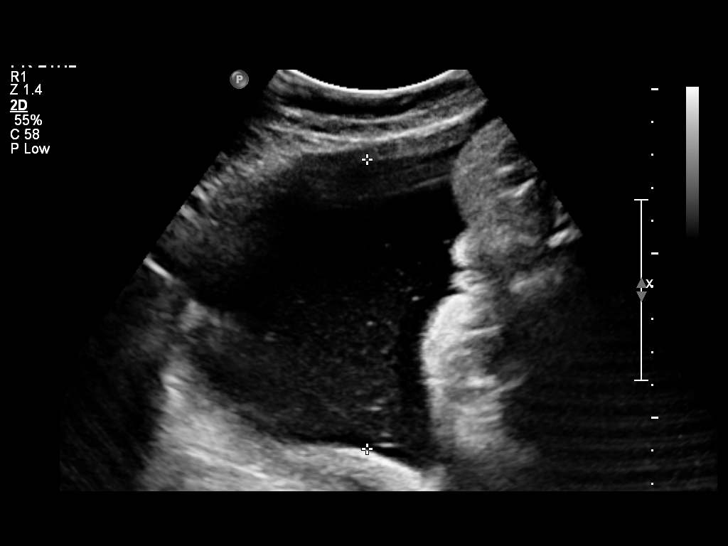

[11 of 11 positions shown; findings below may reference images not displayed]

OBSTETRICS REPORT
                      (Signed Final 05/11/2013 [DATE])

Service(s) Provided

Indications

 Non-reactive NST
Fetal Evaluation

 Num Of Fetuses:    1
 Fetal Heart Rate:  134                          bpm
 Cardiac Activity:  Observed
 Presentation:      Cephalic
 Placenta:          Anterior RT, above
                    cervical os

 Comment:    [DATE] in 5 minutes

 Amniotic Fluid
 AFI FV:      Subjectively within normal limits
                                             Larg Pckt:     8.8  cm
Biophysical Evaluation

 Amniotic F.V:   Within normal limits       F. Tone:        Observed
 F. Movement:    Observed                   Score:          [DATE]
 F. Breathing:   Observed
Gestational Age

 LMP:           36w 4d        Date:  08/28/12                 EDD:   06/04/13
 Clinical EDD:  35w 5d                                        EDD:   06/10/13
 Best:          35w 5d     Det. By:  Clinical EDD             EDD:   06/10/13
Cervix Uterus Adnexa

 Cervix:       Not visualized (advanced GA >16wks)
Impression

 Single IUP at 59w9d
 Active fetus with BPP of [DATE]
 Normal amniotic fluid volume
Recommendations

 Coorelate with fetal tracing - if overall reassuring, recommend
 follow up ultrasounds as clinically indicated.

## 2014-11-29 ENCOUNTER — Other Ambulatory Visit: Payer: Self-pay

## 2014-11-29 ENCOUNTER — Other Ambulatory Visit (HOSPITAL_COMMUNITY)
Admission: RE | Admit: 2014-11-29 | Discharge: 2014-11-29 | Disposition: A | Discharge status: Home / Self Care | Payer: BLUE CROSS/BLUE SHIELD | Source: Ambulatory Visit | Attending: Family Medicine | Admitting: Family Medicine

## 2014-11-29 DIAGNOSIS — Z01419 Encounter for gynecological examination (general) (routine) without abnormal findings: Secondary | ICD-10-CM | POA: Diagnosis not present

## 2014-11-30 LAB — CYTOLOGY - PAP

## 2016-01-15 ENCOUNTER — Other Ambulatory Visit: Payer: Self-pay | Admitting: Radiology

## 2016-01-16 ENCOUNTER — Telehealth: Payer: Self-pay | Admitting: *Deleted

## 2016-01-16 ENCOUNTER — Encounter: Payer: Self-pay | Admitting: *Deleted

## 2016-01-16 DIAGNOSIS — Z170 Estrogen receptor positive status [ER+]: Secondary | ICD-10-CM | POA: Insufficient documentation

## 2016-01-16 DIAGNOSIS — C50212 Malignant neoplasm of upper-inner quadrant of left female breast: Secondary | ICD-10-CM

## 2016-01-16 HISTORY — DX: Malignant neoplasm of upper-inner quadrant of left female breast: C50.212

## 2016-01-16 NOTE — Telephone Encounter (Signed)
Confirmed [facility] for 01/23/16 at 0815 .  Instructions and contact information given.  

## 2016-01-18 ENCOUNTER — Telehealth: Payer: Self-pay | Admitting: *Deleted

## 2016-01-18 NOTE — Telephone Encounter (Signed)
  Oncology Nurse Navigator Documentation  Navigator Location: [facility] (01/18/16 1200)   )Navigator Encounter Type: Telephone (01/18/16 1200) Telephone: Incoming Call (01/18/16 1200)                       Barriers/Navigation Needs: Education (01/18/16 1200) Education: Understanding Cancer/ Treatment Options;Coping with Diagnosis/ Prognosis (01/18/16 1200)                        Time Spent with Patient: 30 (01/18/16 1200)

## 2016-01-23 ENCOUNTER — Encounter: Payer: Self-pay | Admitting: Genetic Counselor

## 2016-01-23 ENCOUNTER — Ambulatory Visit (HOSPITAL_BASED_OUTPATIENT_CLINIC_OR_DEPARTMENT_OTHER): Payer: PRIVATE HEALTH INSURANCE | Admitting: Oncology

## 2016-01-23 ENCOUNTER — Ambulatory Visit
Admission: RE | Admit: 2016-01-23 | Discharge: 2016-01-23 | Disposition: A | Discharge status: Home / Self Care | Payer: BLUE CROSS/BLUE SHIELD | Source: Ambulatory Visit | Attending: Radiation Oncology | Admitting: Radiation Oncology

## 2016-01-23 ENCOUNTER — Other Ambulatory Visit: Payer: Self-pay | Admitting: *Deleted

## 2016-01-23 ENCOUNTER — Encounter: Payer: Self-pay | Admitting: *Deleted

## 2016-01-23 ENCOUNTER — Ambulatory Visit: Payer: PRIVATE HEALTH INSURANCE | Attending: General Surgery | Admitting: Physical Therapy

## 2016-01-23 ENCOUNTER — Other Ambulatory Visit (HOSPITAL_BASED_OUTPATIENT_CLINIC_OR_DEPARTMENT_OTHER): Payer: PRIVATE HEALTH INSURANCE

## 2016-01-23 ENCOUNTER — Ambulatory Visit: Payer: PRIVATE HEALTH INSURANCE

## 2016-01-23 ENCOUNTER — Encounter: Payer: Self-pay | Admitting: Physical Therapy

## 2016-01-23 ENCOUNTER — Ambulatory Visit (HOSPITAL_BASED_OUTPATIENT_CLINIC_OR_DEPARTMENT_OTHER): Payer: Self-pay | Admitting: Genetic Counselor

## 2016-01-23 VITALS — BP 130/71 | HR 89 | Temp 98.2°F | Resp 18 | Ht 66.0 in | Wt 186.6 lb

## 2016-01-23 DIAGNOSIS — C50212 Malignant neoplasm of upper-inner quadrant of left female breast: Secondary | ICD-10-CM

## 2016-01-23 DIAGNOSIS — R293 Abnormal posture: Secondary | ICD-10-CM | POA: Insufficient documentation

## 2016-01-23 DIAGNOSIS — Z170 Estrogen receptor positive status [ER+]: Secondary | ICD-10-CM | POA: Diagnosis not present

## 2016-01-23 DIAGNOSIS — Z315 Encounter for genetic counseling: Secondary | ICD-10-CM

## 2016-01-23 DIAGNOSIS — Z8042 Family history of malignant neoplasm of prostate: Secondary | ICD-10-CM

## 2016-01-23 DIAGNOSIS — Z803 Family history of malignant neoplasm of breast: Secondary | ICD-10-CM

## 2016-01-23 LAB — CBC WITH DIFFERENTIAL/PLATELET
BASO%: 1.1 % (ref 0.0–2.0)
Basophils Absolute: 0 10*3/uL (ref 0.0–0.1)
EOS%: 1.8 % (ref 0.0–7.0)
Eosinophils Absolute: 0.1 10*3/uL (ref 0.0–0.5)
HCT: 39.8 % (ref 34.8–46.6)
HGB: 12.7 g/dL (ref 11.6–15.9)
LYMPH%: 42 % (ref 14.0–49.7)
MCH: 27 pg (ref 25.1–34.0)
MCHC: 32 g/dL (ref 31.5–36.0)
MCV: 84.6 fL (ref 79.5–101.0)
MONO#: 0.3 10*3/uL (ref 0.1–0.9)
MONO%: 8.3 % (ref 0.0–14.0)
NEUT#: 1.9 10*3/uL (ref 1.5–6.5)
NEUT%: 46.8 % (ref 38.4–76.8)
Platelets: 190 10*3/uL (ref 145–400)
RBC: 4.71 10*6/uL (ref 3.70–5.45)
RDW: 13.2 % (ref 11.2–14.5)
WBC: 4.1 10*3/uL (ref 3.9–10.3)
lymph#: 1.7 10*3/uL (ref 0.9–3.3)

## 2016-01-23 LAB — COMPREHENSIVE METABOLIC PANEL
ALT: 27 U/L (ref 0–55)
AST: 18 U/L (ref 5–34)
Albumin: 3.9 g/dL (ref 3.5–5.0)
Alkaline Phosphatase: 47 U/L (ref 40–150)
Anion Gap: 9 mEq/L (ref 3–11)
BUN: 7.3 mg/dL (ref 7.0–26.0)
CO2: 25 mEq/L (ref 22–29)
Calcium: 9.4 mg/dL (ref 8.4–10.4)
Chloride: 107 mEq/L (ref 98–109)
Creatinine: 0.8 mg/dL (ref 0.6–1.1)
EGFR: >90 mL/min/{1.73_m2} (ref >90)
Glucose: 95 mg/dl (ref 70–140)
Potassium: 3.8 mEq/L (ref 3.5–5.1)
Sodium: 141 mEq/L (ref 136–145)
Total Bilirubin: 0.34 mg/dL (ref 0.20–1.20)
Total Protein: 7.5 g/dL (ref 6.4–8.3)

## 2016-01-23 MED ORDER — TAMOXIFEN CITRATE 20 MG PO TABS: 20.0000 mg | ORAL_TABLET | Freq: Every day | ORAL | 12 refills | Status: AC

## 2016-01-23 NOTE — Progress Notes (Addendum)
Radiation Oncology         [phone] ________________________________  Initial Outpatient Consultation  Name: Virginia [name] MRN: [id number]  Date: 01/23/2016  DOB: 07/26/1980  XL:KGMW,NUUVOZD PATRICK, MD  Stark Klein, MD   REFERRING PHYSICIAN: Stark Klein, MD  DIAGNOSIS:    ICD-9-CM ICD-10-CM   1. 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 female who presented with an initial screening mammogram showing a left breast mass as well as two adjacent suspicious clusters of micro calcifications. She had this imaging done due to itching of the breast after weaning her daughter in the Spring time from breast feeding, and this symptom was how her grandmother presented with cancer at age 27. Ultrasound of the mass measured it at 1.0 cm. The mass is at the 10 o'clock position of the left breast. Biopsy on 01/15/16 showed invasive mammary carcinoma with in situ disease. This was ER 90% positive, PR 40% positive, HER2 positive. She also underwent biopsy of a 2 mm cluster of calcifications on the same date revealing focal atypical ductal hyperplasia and fibrocystic changes. The larger area of calcifications has not been biopsied.  Her paternal grandmother had breast cancer at age 41. Her father had prostate cancer at age 64. The patient has no sisters, but one brother. She is a Equities trader. She has two children, ages 51 and 2. She is a nonsmoker and drinks 1 alcoholic drink per week. She started menstruating at age 20-13. Her last period was on 01/21/16. Last pap smear was in 2016. Review of systems is positive for wearing glasses, headaches, and anxiety.    PREVIOUS  RADIATION THERAPY: No  PAST MEDICAL HISTORY:  has a past medical history of Anxiety; Cholestasis; Headache; Hypothyroidism; and Malignant neoplasm of upper-inner quadrant of left female breast ([place]) (01/16/2016).    PAST SURGICAL HISTORY: Past Surgical History:  Procedure Laterality Date  . NO PAST SURGERIES      FAMILY HISTORY: family history includes Breast cancer in her paternal grandmother; Hypertension in her father and mother; Liver cancer in her maternal grandmother and maternal uncle; Lung cancer in her paternal aunt; Prostate cancer in her father.  SOCIAL HISTORY:  reports that she has never smoked. She has never used smokeless tobacco. She reports that she drinks alcohol. She reports that she does not use drugs.  ALLERGIES: Amoxicillin  MEDICATIONS:  Current Outpatient Prescriptions  Medication Sig Dispense Refill  . ibuprofen (ADVIL,MOTRIN) 800 MG tablet Take 1 tablet (800 mg total) by mouth 3 (three) times daily. 30 tablet 0  . Prenatal Vit-Fe Fumarate-FA (PRENATAL MULTIVITAMIN) TABS tablet Take 1 tablet by mouth at bedtime.     No current facility-administered medications for this encounter.     REVIEW OF SYSTEMS: A 10+ POINT REVIEW OF SYSTEMS WAS OBTAINED including neurology, dermatology, psychiatry, cardiac, respiratory, lymph, extremities, GI, GU, Musculoskeletal, constitutional, breasts, reproductive, HEENT.  All pertinent positives are noted in the HPI.  All others are negative.   PHYSICAL EXAM:  Vitals - 1 value per visit 66/44/0347  SYSTOLIC 425  DIASTOLIC 71  Pulse 89  Temperature 98.2  Respirations 18  Weight (lb) 186.6  Height 5'  6"  BMI 30.12  VISIT REPORT    General: Alert and oriented, in mild distress HEENT: Head is normocephalic. Extraocular movements are intact. Oropharynx and oral cavity are clear. Neck: Neck is supple, no palpable cervical or supraclavicular lymphadenopathy. Heart: Regular in rate and rhythm with no murmurs, rubs, or  gallops. Chest: Clear to auscultation bilaterally, with no rhonchi, wheezes, or rales.  Abdomen: Soft, nontender, nondistended, with no rigidity or guarding. Extremities: No cyanosis or edema. Lymphatics: see Neck Exam Skin: No concerning lesions. Musculoskeletal: symmetric strength and muscle tone throughout. Neurologic: Cranial nerves II through XII are grossly intact. No obvious focalities. Speech is fluent. Coordination is intact. Psychiatric: Judgment and insight are intact. Affect is appropriate. Breasts: Left breast and axilla have no obvious palpable masses. Right breast and axilla have no obvious palpable masses.   ECOG = 0  0 - Asymptomatic (Fully active, able to carry on all predisease activities without restriction)  1 - Symptomatic but completely ambulatory (Restricted in physically strenuous activity but ambulatory and able to carry out work of a light or sedentary nature. For example, light housework, office work)  2 - Symptomatic, <50% in bed during the day (Ambulatory and capable of all self care but unable to carry out any work activities. Up and about more than 50% of waking hours)  3 - Symptomatic, >50% in bed, but not bedbound (Capable of only limited self-care, confined to bed or chair 50% or more of waking hours)  4 - Bedbound (Completely disabled. Cannot carry on any self-care. Totally confined to bed or chair)  5 - Death   Eustace Pen MM, Creech RH, Tormey DC, et al. [phone]). "Toxicity and response criteria of the [facility]". [place] Oncol. 5 (6): 649-55   LABORATORY DATA:  Lab Results  Component Value Date   WBC 4.1 01/23/2016   HGB 12.7 01/23/2016   HCT 39.8 01/23/2016   MCV 84.6 01/23/2016   PLT 190 01/23/2016   CMP     Component Value Date/Time   NA 141 01/23/2016 0836   K 3.8 01/23/2016 0836   CL 104 07/16/2014 2236   CO2 25 01/23/2016 0836   GLUCOSE 95 01/23/2016 0836   BUN 7.3 01/23/2016 0836   CREATININE 0.8  01/23/2016 0836   CALCIUM 9.4 01/23/2016 0836   PROT 7.5 01/23/2016 0836   ALBUMIN 3.9 01/23/2016 0836   AST 18 01/23/2016 0836   ALT 27 01/23/2016 0836   ALKPHOS 47 01/23/2016 0836   BILITOT 0.34 01/23/2016 0836   GFRNONAA >60 07/16/2014 2236   GFRAA >60 07/16/2014 2236         RADIOGRAPHY: No results found.    IMPRESSION/PLAN:  Stage IA Left Breast cancer, triple positive.  The patient has the following appointment pending: breast MRI to determine extent of disease and genetics consultation. The results of these appointments may determine whether she is an appropriate candidate for breast conservation versus single or double mastectomy. We discussed that adjuvant radiation for 6 weeks would be indicated in the setting of breast conservation, or if she shows positive nodes or positive margins after mastectomy.  It was a pleasure meeting the patient today. We discussed the risks, benefits, and side effects of radiotherapy.   I look forward to participating in the patient's care if needed.  I will await her referral back to me for postoperative follow-up and eventual CT simulation/treatment planning if indicated.  Our team will refer the patient to one of our social workers  for counseling and support during this stressful time.  __________________________________________   Eppie Gibson, MD  This document serves as a record of services personally performed by Eppie Gibson, MD. It was created on her behalf by Maryla Morrow, a trained medical scribe. The creation of this record is based on the scribe's personal observations and the provider's statements to them. This document has been checked and approved by the attending provider.

## 2016-01-23 NOTE — Patient Instructions (Signed)

## 2016-01-23 NOTE — Progress Notes (Signed)
[facility] [place] [facility]  [phone] [phone]     ID: Virginia [name] DOB: [birth year]  MR#: [id number]  BCW#:[id number]  Patient Care Team: [name], MD as PCP - General (Family Medicine) Stark Klein, MD as Consulting Physician (General Surgery) Chauncey Cruel, MD as Consulting Physician (Oncology) Eppie Gibson, MD as Attending Physician (Radiation Oncology) Chauncey Cruel, MD OTHER MD:  CHIEF COMPLAINT:  Estrogen receptor positive breast cancer  CURRENT TREATMENT:  Neoadjuvant tamoxifen    BREAST CANCER HISTORY: [name] noted some itching on her breasts and since this was the symptom that preceded her grandmother's breast cancer she underwent baseline bilateral screening mammography at [facility] 01/10/2016. The breast density was category B. In the left breast central to the nipple there was a 1.7 cm irregular high density mass with pleomorphic calcifications. Accordingly the patient was brought back 01/14/2016 for left diagnostic mammography and ultrasonography. This confirmed a 0.8 cm irregular mass in the left breast at the 10:00 position. 0.5 cm from the nipple lateral and posterior to that there was a 0.2 cm cluster of amorphous calcifications with an associated density. Anterior to the index mass was a 0.5 cm cluster of amorphous calcifications. All 3 findings taken together 3 cm. Ultrasound of the left breast found a 1 cm irregular mass in the left breast at the 10:00 position retroareolarly. There were no other sonographic abnormalities and the left axilla was sonographically benign  On 01/15/2016 the patient underwent core needle biopsy of the left breast index lesion at 10:00, and this found invasive ductal carcinoma, E-cadherin positive, estrogen receptor 90% positive, progesterone receptor 40% positive, both with moderate staining intensity, with an MIB-1 of 5%, but HER-2 amplified, with a signals ratio of 2.41 and the number per cell 6.50. One  of the 2 areas of abnormal calcifications, the more posterior one measuring 0.2 cm, was also biopsied and showed only atypical ductal hyperplasia (S [id number]).  Her subsequent history is as detailed below   INTERVAL HISTORY: Virginia [name] was evaluated in the multidisciplinary breast cancer clinic 01/23/2016 accompanied by her husband Virginia [name] Her case was also presented in the multidisciplinary breast cancer conference that same morning. At that time a preliminary plan was proposed: Genetics testing with results helping to guide the definitive surgical decision, also MRI prior to surgery; consider neoadjuvant tamoxifen and if there will be any surgical delays; Taxol and Herceptin adjuvantly followed by radiation and then anti-estrogens  REVIEW OF SYSTEMS: Aside from the slight itching problem, which resolved on its own, there were no specific symptoms leading to the original mammogram, which was routinely scheduled. The patient denies unusual headaches, visual changes, nausea, vomiting, stiff neck, dizziness, or gait imbalance. There has been no cough, phlegm production, or pleurisy, no chest pain or pressure, and no change in bowel or bladder habits. The patient denies fever, rash, bleeding, unexplained fatigue or unexplained weight loss. A detailed review of systems was otherwise entirely negative.   PAST MEDICAL HISTORY: Past Medical History:  Diagnosis Date  . Anxiety   . Cholestasis   . Headache   . Hypothyroidism   . Malignant neoplasm of upper-inner quadrant of left female breast ([place]) 01/16/2016    PAST SURGICAL HISTORY: Past Surgical History:  Procedure Laterality Date  . NO PAST SURGERIES      FAMILY HISTORY Family History  Problem Relation Age of Onset  . Hypertension Mother   . Hypertension Father   . Prostate cancer Father   . Liver  cancer Maternal Grandmother   . Breast cancer Paternal Grandmother   . Lung cancer Paternal Aunt   . Liver cancer Maternal Uncle     The patient's parents are alive, in their mid 28s as of November 2017. The patient father was diagnosed with prostate cancer at age 66. A paternal aunt was diagnosed with lung cancer in her late 60s. Also the paternal grandmother was diagnosed with breast cancer at the age of 49. On the mother's side there are 2 cases of liver cancer, age of diagnosis unknown.  GYNECOLOGIC HISTORY:  No LMP recorded.  Menarche age 62, first live birth age 89, she is [place] P2. She still having periods, most recently 01/21/2016, but they are no longer regular. She remotely used a number ring and oral contraceptives but stopped in 2016.   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 Her husband [name] works as a Printmaker for [employer] (for example he worked on our (). Their children are Virginia [name] and Virginia [name],, aged 70 and 2 as of November 2017. Attention and is not a church attender     ADVANCED DIRECTIVES: Not in place   HEALTH MAINTENANCE: Social History  Substance Use Topics  . Smoking status: Never Smoker  . Smokeless tobacco: Never Used  . Alcohol use Yes     Colonoscopy:  PAP:2016  Bone density:   Allergies  Allergen Reactions  . Amoxicillin Nausea Only    diarrhea    Current Outpatient Prescriptions  Medication Sig Dispense Refill  . ibuprofen (ADVIL,MOTRIN) 800 MG tablet Take 1 tablet (800 mg total) by mouth 3 (three) times daily. 30 tablet 0  . Prenatal Vit-Fe Fumarate-FA (PRENATAL MULTIVITAMIN) TABS tablet Take 1 tablet by mouth at bedtime.    . tamoxifen (NOLVADEX) 20 MG tablet Take 1 tablet (20 mg total) by mouth daily. 90 tablet 12   No current facility-administered medications for this visit.     OBJECTIVE: Young-appearing woman in no acute distress  Vitals:   01/23/16 0914  BP: 130/71  Pulse: 89  Resp: 18  Temp: 98.2 F (36.8 C)     Body mass index is 30.12 kg/m.    ECOG FS:0 - Asymptomatic  Ocular: Sclerae  unicteric, pupils round and equal Ear-nose-throat: Oropharynx clear and moist Lymphatic: No cervical or supraclavicular adenopathy Lungs no rales or rhonchi, good excursion bilaterally Heart regular rate and rhythm, no murmur appreciated Abd soft, nontender, positive bowel sounds MSK no focal spinal tenderness, no joint edema Neuro: non-focal, well-oriented, appropriate affect Breasts: The right breast is unremarkable. I do not palpate a mass in the left breast. There are no skin or nipple changes of concern. The left axilla is benign.   LAB RESULTS:  CMP     Component Value Date/Time   NA 141 01/23/2016 0836   K 3.8 01/23/2016 0836   CL 104 07/16/2014 2236   CO2 25 01/23/2016 0836   GLUCOSE 95 01/23/2016 0836   BUN 7.3 01/23/2016 0836   CREATININE 0.8 01/23/2016 0836   CALCIUM 9.4 01/23/2016 0836   PROT 7.5 01/23/2016 0836   ALBUMIN 3.9 01/23/2016 0836   AST 18 01/23/2016 0836   ALT 27 01/23/2016 0836   ALKPHOS 47 01/23/2016 0836   BILITOT 0.34 01/23/2016 0836   GFRNONAA >60 07/16/2014 2236   GFRAA >60 07/16/2014 2236    INo results found for: SPEP, UPEP  Lab Results  Component Value Date   WBC 4.1  01/23/2016   NEUTROABS 1.9 01/23/2016   HGB 12.7 01/23/2016   HCT 39.8 01/23/2016   MCV 84.6 01/23/2016   PLT 190 01/23/2016      Chemistry      Component Value Date/Time   NA 141 01/23/2016 0836   K 3.8 01/23/2016 0836   CL 104 07/16/2014 2236   CO2 25 01/23/2016 0836   BUN 7.3 01/23/2016 0836   CREATININE 0.8 01/23/2016 0836      Component Value Date/Time   CALCIUM 9.4 01/23/2016 0836   ALKPHOS 47 01/23/2016 0836   AST 18 01/23/2016 0836   ALT 27 01/23/2016 0836   BILITOT 0.34 01/23/2016 0836       No results found for: LABCA2  No components found for: LABCA125  No results for input(s): INR in the last 168 hours.  Urinalysis    Component Value Date/Time   COLORURINE YELLOW 10/14/2012 1954   APPEARANCEUR CLEAR 10/14/2012 1954   LABSPEC >1.030  (H) 10/14/2012 1954   PHURINE 6.0 10/14/2012 1954   GLUCOSEU NEGATIVE 10/14/2012 1954   HGBUR TRACE (A) 10/14/2012 1954   BILIRUBINUR NEGATIVE 10/14/2012 1954   KETONESUR 15 (A) 10/14/2012 1954   PROTEINUR NEGATIVE 10/14/2012 1954   UROBILINOGEN 0.2 10/14/2012 1954   NITRITE NEGATIVE 10/14/2012 1954   LEUKOCYTESUR NEGATIVE 10/14/2012 1954     STUDIES: Outside studies reviewed   ELIGIBLE FOR AVAILABLE RESEARCH PROTOCOL: no  ASSESSMENT: 35 y.o. [place] woman  (1) tamoxifen started 01/23/2016 neoadjuvantly bending definitive surgical decision  (2) genetics testing in process  (3) definitive surgery to follow  (4) paclitaxel and trastuzumab to follow surgery  (5) trastuzumab to be continued to complete a year  (6) adjuvant radiation to follow chemotherapy  (7) anti-estrogens to resume at the completion of local treatment, to be continued for 5-10 years perfect slightly  PLAN: We spent the better part of today's hour-long appointment discussing the biology of breast cancer in general, and the specifics of the patient's tumor in particular. .We first reviewed the fact that cancer is not one disease but more than 100 different diseases and that it is important to keep them separate-- otherwise when friends and relatives discuss their own cancer experiences with [place] confusion can result. Similarly we explained that if breast cancer spreads to the bone or liver, the patient would not have bone cancer or liver cancer, but breast cancer in the bone and breast cancer in the liver: one cancer in three places-- not 3 different cancers which otherwise would have to be treated in 3 different ways.  We discussed the difference between local and systemic therapy. In terms of loco-regional treatment, lumpectomy plus radiation is equivalent to mastectomy as far as survival is concerned. For this reason, and because the cosmetic results are generally superior, we recommend breast conserving  surgery. We also noted that in terms of sequencing of treatments, whether systemic therapy or surgery is done first does not affect the ultimate outcome.  We then discussed the rationale for systemic therapy. There is some risk that this cancer may have already spread to other parts of her body. Patients frequently ask at this point about bone scans, CAT scans and PET scans to find out if they have occult breast cancer somewhere else. The problem is that in early stage disease we are much more likely to find false positives then true cancers and this would expose the patient to unnecessary procedures as well as unnecessary radiation. Scans cannot answer the question the patient really  would like to know, which is whether she has microscopic disease elsewhere in her body. For those reasons we do not recommend them.  Of course we would proceed to aggressive evaluation of any symptoms that might suggest metastatic disease, but that is not the case here.  Next we went over the options for systemic therapy which are anti-estrogens, anti-HER-2 immunotherapy, and chemotherapy. Oniyah meets criteria for old 3. This is particularly favorable. It means that if her risk of recurrence after local treatment is next, with chemotherapy lowering that by one third, anti-HER-2 treatment of cutting it by one half, and anti-estrogens again cutting at by one half, she will end up with 1/6 of the risk. For example if her predicted risk of recurrence with local treatment only is 24%, this would drop to 4%  We then discussed her systemic therapy in more detail and she understands that she will receive trastuzumab and paclitaxel. We discussed the possible toxicities, side effects and complications of these agents and an introductory form.  First I went over she has to get her genetics testing on, so she can decide which surgery she is going to have. Because this may take several weeks, I think it would be useful for her to start  tamoxifen now. That way she can take as long as she needs to make the best surgical decision possible, even if she needs to meet with plastics and consider different types of reconstruction.  I gave her information on the possible toxicities, side effects and complications of tamoxifen and she will start that medication today.  She is going to see me again in approximately 3 weeks. By then we may have results of the genetics testing and may be ready to proceed to a more definitive plan.  Mykelti has a good understanding of the overall plan. She agrees with it. She knows the goal of treatment in her case is cure and that her chance of cure is very good. She will call with any problems that may develop before her next visit here.  Chauncey Cruel, MD   01/23/2016 10:58 AM Medical Oncology and Hematology [facility] [address] [place], [place] [address] Tel. [phone]    Fax. [phone]

## 2016-01-23 NOTE — Progress Notes (Signed)
Nutrition Assessment  Reason for Assessment:  Pt seen in Breast Clinic  ASSESSMENT:   35 year old female with new diagnosis of left breast cancer. No significant past medical history.  Reports normal intake.  Medications:  reviewed  Labs: reviewed  Anthropometrics:   Height: 66 inches Weight: 186 lb BMI: 30   NUTRITION DIAGNOSIS: Food and nutrition related knowledge deficit related to new diagnosis of breast cancer as evidenced by no prior need for nutrition related information.  INTERVENTION:   Discussed and provided packet of information regarding nutritional tips for breast cancer patients.  Questions answered.  Teachback method used.      MONITORING, EVALUATION, and GOAL: Pt will consume a healthy plant based diet to maintain lean body mass throughout treatment.   [name] B. Zenia Resides, [place], [place] (pager)

## 2016-01-23 NOTE — Therapy (Signed)
[place], [place], [address] Phone: [phone]   Fax:  [phone]  Physical Therapy Evaluation  Patient Details  Name: Virginia [name] MRN: [id number] Date of Birth: Sep 19, 1980 Referring Provider: Dr. Stark Klein  Encounter Date: 01/23/2016      PT End of Session - 01/23/16 1042    Visit Number 1   Number of Visits 1   PT Start Time 0925   PT Stop Time 0948   PT Time Calculation (min) 23 min   Activity Tolerance Patient tolerated treatment well   Behavior During Therapy [facility] for tasks assessed/performed      Past Medical History:  Diagnosis Date  . Anxiety   . Cholestasis   . Headache   . Hypothyroidism   . Malignant neoplasm of upper-inner quadrant of left female breast ([place]) 01/16/2016    Past Surgical History:  Procedure Laterality Date  . NO PAST SURGERIES      There were no vitals filed for this visit.       Subjective Assessment - 01/23/16 1042    Subjective Patient reports she is here today to be seen by her medical team for her newly diagnosed left breast cancer.   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 There are 3 seperate areas totaling 3 cm in the upper inner quadrant with a Ki67 of 5%.   Patient Stated Goals Reduce lymphedema risk and learn post op shoulder ROM HEP            [facility] PT Assessment - 01/23/16 0001      Assessment   Medical Diagnosis Left breast cancer   Referring Provider Dr. Stark Klein   Onset Date/Surgical Date 01/10/16   Hand Dominance Right   Prior Therapy none     Precautions   Precautions Other (comment)   Precaution Comments active cancer     Restrictions   Weight Bearing Restrictions No     Balance Screen   Has the patient fallen in the past 6 months No   Has the patient had a decrease in activity level because of a fear of  falling?  No   Is the patient reluctant to leave their home because of a fear of falling?  No     Home Environment   Living Environment Private residence   Living Arrangements Spouse/significant other;Children  Husband, 2 and 73 y.o. 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   Leisure She exercises doing a Archivist routine     Cognition   Overall Cognitive Status Within Functional Limits for tasks assessed     Posture/Postural Control   Posture/Postural Control Postural limitations   Postural Limitations Rounded Shoulders;Forward head     ROM / Strength   AROM / PROM / Strength AROM;Strength     AROM   AROM Assessment Site Shoulder;Cervical   Right/Left Shoulder Right;Left   Right Shoulder Extension 47 Degrees   Right Shoulder Flexion 152 Degrees   Right Shoulder ABduction 154 Degrees   Right Shoulder Internal Rotation 70 Degrees   Right Shoulder External Rotation 72 Degrees   Left Shoulder Extension 47 Degrees   Left Shoulder Flexion 147 Degrees   Left Shoulder ABduction 157 Degrees   Left Shoulder  Internal Rotation 61 Degrees   Left Shoulder External Rotation 79 Degrees   Cervical Flexion WNl   Cervical Extension WNL   Cervical - Right Side Bend WNL   Cervical - Left Side Bend WNL   Cervical - Right Rotation WNL   Cervical - Left Rotation WNL     Strength   Overall Strength Within functional limits for tasks performed           LYMPHEDEMA/ONCOLOGY QUESTIONNAIRE - 01/23/16 1040      Type   Cancer Type Left breast cancer     Lymphedema Assessments   Lymphedema Assessments Upper extremities     Right Upper Extremity Lymphedema   10 cm Proximal to Olecranon Process 27.5 cm   15 cm Proximal to Ulnar Styloid Process 23.8 cm   10 cm Proximal to Ulnar Styloid Process 17 cm   Just Proximal to Ulnar Styloid Process  20.2 cm   Across Hand at [employer] 6.9 cm     Left Upper Extremity Lymphedema   10 cm Proximal to Olecranon Process 30.6 cm   Olecranon Process 27.1 cm   10 cm Proximal to Ulnar Styloid Process 23 cm   Just Proximal to Ulnar Styloid Process 17.4 cm   Across Hand at [employer] 20.4 cm   At [place] of 2nd Digit 6.7 cm            Patient was instructed today in a home exercise program today for post op shoulder range of motion. These included active assist shoulder flexion in sitting, scapular retraction, wall walking with shoulder abduction, and hands behind head external rotation.  She was encouraged to do these twice a day, holding 3 seconds and repeating 5 times when permitted by her physician.         PT Education - 01/23/16 1041    Education provided Yes   Education Details Lymphedema risk reduction and post op shoulder ROM HEP   Person(s) Educated Patient   Methods Explanation;Demonstration;Handout   Comprehension Returned demonstration;Verbalized understanding              Breast Clinic Goals - 01/23/16 1045      Patient will be able to verbalize understanding of pertinent lymphedema risk reduction practices relevant to her diagnosis specifically related to skin care.   Time 1   Period Days   Status Achieved     Patient will be able to return demonstrate and/or verbalize understanding of the post-op home exercise program related to regaining shoulder range of motion.   Time 1   Period Days   Status Achieved     Patient will be able to verbalize understanding of the importance of attending the postoperative After Breast Cancer Class for further lymphedema risk reduction education and therapeutic exercise.   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 There are 3 seperate areas totaling 3 cm in the upper  inner quadrant with a Ki67 of 5%.  Her multidisciplinary medical team met prior to her assessments to determine a recommended treatment plan.  She is planning to have an MRI, left lumpectomy and sentinel node biopsy, chemotherapy, radiation, and anti-estrogen therapy.  She may benefit from post op PT to regain shoulder ROM and reduce lymphdema risk.  Due to her lack of  comorbidities, her eval is of low complexity.   Rehab Potential Excellent   Clinical Impairments Affecting Rehab Potential none   PT Frequency One time visit   PT Treatment/Interventions Patient/family education;Therapeutic exercise   PT Next Visit Plan Will f/u after surgery to determine a recommended treatment plan.   PT Home Exercise Plan Post op shoulder ROM HEP   Consulted and Agree with Plan of Care Patient;Family member/caregiver   Family Member Consulted Husband      Patient will benefit from skilled therapeutic intervention in order to improve the following deficits and impairments:  Postural dysfunction, Decreased knowledge of precautions, Pain, Impaired UE functional use, Decreased range of motion  Visit Diagnosis: Carcinoma of upper-inner quadrant of left breast in female, estrogen receptor positive ([place]) - Plan: PT plan of care cert/re-cert  Abnormal posture - Plan: PT plan of care cert/re-cert   Patient will follow up at outpatient cancer rehab if needed following surgery.  If the patient requires physical therapy at that time, a specific plan will be dictated and sent to the referring physician for approval. The patient was educated today on appropriate basic range of motion exercises to begin post operatively and the importance of attending the After Breast Cancer class following surgery.  Patient was educated today on lymphedema risk reduction practices as it pertains to recommendations that will benefit the patient immediately following surgery.  She verbalized good understanding.  No additional physical therapy  is indicated at this time.      Problem List Patient Active Problem List   Diagnosis Date Noted  . Malignant neoplasm of upper-inner quadrant of left female breast ([place]) 01/16/2016  . Postpartum state 05/20/2013  . Cholelithiasis complicating pregnancy, antepartum 05/18/2013  . Cholestasis of pregnancy 04/28/2013    Annia Friendly, PT 01/23/16 10:47 AM  [place], [place], [address] Phone: [phone]   Fax:  [phone]  Name: JHOSELIN CRUME MRN: [id number] Date of Birth: 01/28/1981

## 2016-01-23 NOTE — Progress Notes (Signed)
REFERRING PROVIDER: [name], MD [place], [place] [address]   Lurline Del, MD  PRIMARY PROVIDER:  Anthoney Harada, MD  PRIMARY REASON FOR VISIT:  1. Malignant neoplasm of upper-inner quadrant of left breast in female, estrogen receptor positive ([place])   2. Family history of breast cancer   3. Family history of prostate cancer      HISTORY OF PRESENT ILLNESS:   Virginia [name], a 35 y.o. female, was seen for a [place] cancer genetics consultation at the request of Dr. Jacelyn Grip due to a personal and family history of cancer.  Virginia [name] presents to clinic today to discuss the possibility of a hereditary predisposition to cancer, genetic testing, and to further clarify her future cancer risks, as well as potential cancer risks for family members.   In January 16, 2016, at the age of 35, Virginia [name] was diagnosed with invasive ductal carcinoma of the left breast.  The tumor is triple positive. This will be treated with surgery, chemotherapy, radiation, herceptin and tamoxifen.  Surgery will depend on genetic testing.    CANCER HISTORY:   No history exists.     HORMONAL RISK FACTORS:  Menarche was at age 50-13.  First live birth at age 82.  OCP use for approximately 2-3 years.  Ovaries intact: yes.  Hysterectomy: no.  Menopausal status: premenopausal.  HRT use: 0 years. Colonoscopy: no; not examined. Mammogram within the last year: yes. Number of breast biopsies: 1. Up to date with pelvic exams:  yes. Any excessive radiation exposure in the past:  no  Past Medical History:  Diagnosis Date  . Anxiety   . Cholestasis   . Family history of breast cancer   . Family history of prostate cancer   . Headache   . Hypothyroidism   . Malignant neoplasm of upper-inner quadrant of left female breast ([place]) 01/16/2016    Past Surgical History:  Procedure Laterality Date  . NO PAST SURGERIES      Social History   Social History  . Marital status:  Married    Spouse name: N/A  . Number of children: 2  . Years of education: N/A   Social History Main Topics  . Smoking status: Never Smoker  . Smokeless tobacco: Never Used  . Alcohol use Yes  . Drug use: No  . Sexual activity: Yes   Other Topics Concern  . None   Social History Narrative  . None     FAMILY HISTORY:  We obtained a detailed, 4-generation family history.  Significant diagnoses are listed below: Family History  Problem Relation Age of Onset  . Hypertension Mother   . Hypertension Father   . Prostate cancer Father   . Liver cancer Maternal Grandmother   . Breast cancer Paternal Grandmother     dx in her 70s  . Lung cancer Paternal Aunt     smoker  . Heart attack Paternal Grandfather   . Liver cancer Other     The patient has two young children who are cancer free.  She has one full brother who is cancer free.  Her mother is adopted, but she met a brother who is healthy.  The patient's mother's mother had a drug and alcohol problem and died of liver cancer, as did her mother's brother.    The patient's father was diagnosed with prostate cancer at 43.  He has three brothers and two sisters.  One sister had lung cancer due to smoking.  His  mother was diagnosed with breast cancer in her 15's.  Patient's maternal ancestors are of [place] and [place] descent, and paternal ancestors are of [place] and [place] descent. There is no reported Ashkenazi Jewish ancestry. There is no known consanguinity.  GENETIC COUNSELING ASSESSMENT: Virginia [name] is a 35 y.o. female with a personal and family history of cancer which is somewhat suggestive of a hereditary cancer syndrome and predisposition to cancer. We, therefore, discussed and recommended the following at today's visit.   DISCUSSION: We discussed that about 5-10% of breast cancer is due to hereditary causes with most cases due to BRCA mutations.  Other genes associated with hereditary  breast cancer syndromes include PALB2, ATM and CHEK2.  We reviewed the characteristics, features and inheritance patterns of hereditary cancer syndromes. Based on her father's diagnosis of relatively young prostate cancer we discussed HOXB13 testing.  We also discussed genetic testing, including the appropriate family members to test, the process of testing, insurance coverage and turn-around-time for results. We discussed the implications of a negative, positive and/or variant of uncertain significant result. We recommended Virginia [name] pursue genetic testing for the Breast/Ovarian cancer gene panel and the HOXB13 gene. The Breast/Ovarian gene panel offered by [facility] includes sequencing and rearrangement analysis for the following 20 genes:  ATM, BARD1, BRCA1, BRCA2, BRIP1, CDH1, CHEK2, EPCAM, FANCC, MLH1, MSH2, MSH6, NBN, PALB2, PMS2, PTEN, RAD51C, RAD51D, TP53, and XRCC2.     Based on Ms. Tailor's personal and family history of cancer, she meets medical criteria for genetic testing. Despite that she meets criteria, she may still have an out of pocket cost. We discussed that if her out of pocket cost for testing is over $100, the laboratory will call and confirm whether she wants to proceed with testing.  If the out of pocket cost of testing is less than $100 she will be billed by the genetic testing laboratory.   PLAN: After considering the risks, benefits, and limitations, Virginia [name]  provided informed consent to pursue genetic testing and the blood sample was sent to [facility] for analysis of the Breast/Ovarian cancer panel and HOXB13. We placed her test on a RUSH, which hopefully means that results should be available within approximately 2-3 weeks' time, at which point they will be disclosed by telephone to Virginia [name], as will any additional recommendations warranted by these results. Virginia [name] will receive a summary of her genetic counseling visit and a copy of her results once  available. This information will also be available in Epic. We encouraged Virginia [name] to remain in contact with cancer genetics annually so that we can continuously update the family history and inform her of any changes in cancer genetics and testing that may be of benefit for her family. Virginia [name] questions were answered to her satisfaction today. Our contact information was provided should additional questions or concerns arise.  Lastly, we encouraged Virginia [name] to remain in contact with cancer genetics annually so that we can continuously update the family history and inform her of any changes in cancer genetics and testing that may be of benefit for this family.   Ms.  [name] questions were answered to her satisfaction today. Our contact information was provided should additional questions or concerns arise. Thank you for the referral and allowing [place] to share in the care of your patient.   Abdirahman Chittum P. Florene Glen, [place], [facility] Certified Genetic Counselor [email or web address] phone: [phone]  The patient was seen for a total of 40  minutes in face-to-face genetic counseling.  This patient was discussed with Drs. Magrinat, Lindi Adie and/or Burr Medico who agrees with the above.    _______________________________________________________________________ For Office Staff:  Number of people involved in session: 2 Was an Intern/ student involved with case: no

## 2016-01-23 NOTE — Addendum Note (Signed)
Encounter addended by: Eppie Gibson, MD on: 01/23/2016 12:13 PM<BR>    Actions taken: Sign clinical note

## 2016-01-23 NOTE — Progress Notes (Signed)
Clinical Social Work [name] Psychosocial Distress Screening [place]  Patient completed distress screening protocol and scored a 7 on the Psychosocial Distress Thermometer which indicates moderate distress. Clinical Social Worker met with patient and patients husband in [facility] to assess for distress and other psychosocial needs. Patient stated she was feeling overwhelmed but felt "better" after meeting with the treatment team and getting more information on her treatment plan. [name] and patient discussed common feeling and emotions when being diagnosed with cancer, and the importance of support during treatment.  Patient stated her primary concern was talking to her 37 year old son about her diagnosis.  [name] and patient discussed common feeling and emotions associated with telling your child you have cancer.  [name], patient, and patients husband reviewed resources for "talking with your child about cancer".  [name] offered additional support and encouraged patient to call with any additional needs. [name] informed patient of the support team and support services at [facility]. [name] provided contact information and encouraged patient to call with any questions or concerns.   ONCBCN DISTRESS SCREENING 01/23/2016  Screening Type Initial Screening  Distress experienced in past week (1-10) 7  Emotional problem type Nervousness/Anxiety;Adjusting to illness  Referral to clinical social work Yes    [name], MSW, LCSW, OSW-C Clinical Social Worker [facility] [phone]

## 2016-01-25 ENCOUNTER — Telehealth: Payer: Self-pay | Admitting: *Deleted

## 2016-01-25 ENCOUNTER — Encounter: Payer: Self-pay | Admitting: *Deleted

## 2016-01-25 NOTE — Telephone Encounter (Signed)
Spoke to pt concerning [place] from 01/23/16. Denies questions or concerns regarding dx or treatment care plan. R/s chemo education class to 12/18 per pt request. Encourage pt to call with concerns. Received verbal understanding.

## 2016-01-28 ENCOUNTER — Other Ambulatory Visit: Payer: Self-pay | Admitting: *Deleted

## 2016-01-28 DIAGNOSIS — C50212 Malignant neoplasm of upper-inner quadrant of left female breast: Secondary | ICD-10-CM

## 2016-01-29 ENCOUNTER — Ambulatory Visit (HOSPITAL_COMMUNITY)
Admission: RE | Admit: 2016-01-29 | Discharge: 2016-01-29 | Disposition: A | Discharge status: Home / Self Care | Payer: PRIVATE HEALTH INSURANCE | Source: Ambulatory Visit | Attending: General Surgery | Admitting: General Surgery

## 2016-01-29 DIAGNOSIS — R928 Other abnormal and inconclusive findings on diagnostic imaging of breast: Secondary | ICD-10-CM | POA: Insufficient documentation

## 2016-01-29 DIAGNOSIS — C50212 Malignant neoplasm of upper-inner quadrant of left female breast: Secondary | ICD-10-CM | POA: Insufficient documentation

## 2016-01-29 DIAGNOSIS — Z170 Estrogen receptor positive status [ER+]: Secondary | ICD-10-CM | POA: Insufficient documentation

## 2016-01-29 MED ORDER — GADOBENATE DIMEGLUMINE 529 MG/ML IV SOLN
20.0000 mL | Freq: Once | INTRAVENOUS | Status: AC | PRN
  Administered 2016-01-29: 17 mL via INTRAVENOUS

## 2016-01-31 ENCOUNTER — Other Ambulatory Visit: Payer: PRIVATE HEALTH INSURANCE

## 2016-02-04 ENCOUNTER — Other Ambulatory Visit: Payer: Self-pay | Admitting: General Surgery

## 2016-02-04 DIAGNOSIS — N6320 Unspecified lump in the left breast, unspecified quadrant: Secondary | ICD-10-CM

## 2016-02-11 ENCOUNTER — Ambulatory Visit (HOSPITAL_COMMUNITY)
Admission: RE | Admit: 2016-02-11 | Discharge: 2016-02-11 | Disposition: A | Discharge status: Home / Self Care | Payer: PRIVATE HEALTH INSURANCE | Source: Ambulatory Visit | Attending: Oncology | Admitting: Oncology

## 2016-02-11 ENCOUNTER — Other Ambulatory Visit: Payer: PRIVATE HEALTH INSURANCE

## 2016-02-11 DIAGNOSIS — Z170 Estrogen receptor positive status [ER+]: Secondary | ICD-10-CM | POA: Diagnosis not present

## 2016-02-11 DIAGNOSIS — C50212 Malignant neoplasm of upper-inner quadrant of left female breast: Secondary | ICD-10-CM | POA: Diagnosis present

## 2016-02-11 NOTE — Progress Notes (Signed)
  Echocardiogram 2D Echocardiogram has been performed.  Donata Clay 02/11/2016, 9:52 AM

## 2016-02-15 ENCOUNTER — Ambulatory Visit (HOSPITAL_BASED_OUTPATIENT_CLINIC_OR_DEPARTMENT_OTHER): Payer: PRIVATE HEALTH INSURANCE | Admitting: Oncology

## 2016-02-15 VITALS — BP 129/76 | HR 71 | Temp 98.4°F | Resp 17 | Ht 66.0 in | Wt 183.8 lb

## 2016-02-15 DIAGNOSIS — C50212 Malignant neoplasm of upper-inner quadrant of left female breast: Secondary | ICD-10-CM | POA: Diagnosis not present

## 2016-02-15 DIAGNOSIS — Z170 Estrogen receptor positive status [ER+]: Secondary | ICD-10-CM

## 2016-02-15 NOTE — Progress Notes (Signed)
[facility]  [phone] [phone]     ID: Virginia [name] DOB: 1980/09/06  MR#: [id number]  CNO#:[id number]  Patient Care Team: [name], MD as PCP - General (Family Medicine) Stark Klein, MD as Consulting Physician (General Surgery) Chauncey Cruel, MD as Consulting Physician (Oncology) Eppie Gibson, MD as Attending Physician (Radiation Oncology) Chauncey Cruel, MD OTHER MD:  CHIEF COMPLAINT:  Estrogen receptor positive breast cancer  CURRENT TREATMENT:  Neoadjuvant tamoxifen, awaiting definitive surgery    BREAST CANCER HISTORY: From the original intake note:  Virginia [name] noted some itching on her breasts and since this was the symptom that preceded her grandmother's breast cancer she underwent baseline bilateral screening mammography at [facility] 01/10/2016. The breast density was category B. In the left breast central to the nipple there was a 1.7 cm irregular high density mass with pleomorphic calcifications. Accordingly the patient was brought back 01/14/2016 for left diagnostic mammography and ultrasonography. This confirmed a 0.8 cm irregular mass in the left breast at the 10:00 position. 0.5 cm from the nipple lateral and posterior to that there was a 0.2 cm cluster of amorphous calcifications with an associated density. Anterior to the index mass was a 0.5 cm cluster of amorphous calcifications. All 3 findings taken together 3 cm. Ultrasound of the left breast found a 1 cm irregular mass in the left breast at the 10:00 position retroareolarly. 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 One of the 2 areas of abnormal calcifications, the more posterior one measuring 0.2 cm, was also biopsied and showed only atypical ductal hyperplasia (S [id number]).  Her subsequent history is as detailed below   INTERVAL HISTORY: Virginia [name] returns today for follow-up of her HER-2/neu positive breast cancer accompanied by her husband. Virginia [name] is on tamoxifen. She generally tolerates this well. Initially she had some hip pain bilaterally, alternating hips, improved with activity. This is now pretty much resolved. She does have some hot flashes especially at night. They don't wake her up and she really does not want to take any additional medication for that.  Since her last visit here she had a breast MRI which showed a fairly large area (4.1 cm) of non-masslike enhancement associated with the prior area of ADH. An MRI guided biopsy scheduled for 02/19/2016.  REVIEW OF SYSTEMS: She has occasional headaches which are not more common or persistent than prior. A detailed review of systems today was otherwise stable.   PAST MEDICAL HISTORY: Past Medical History:  Diagnosis Date  . Anxiety   . Cholestasis   . Family history of breast cancer   . Family history of prostate cancer   . Headache   . Hypothyroidism   . 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 NO PAST SURGERIES      FAMILY HISTORY Family History  Problem Relation Age of Onset  . Hypertension Mother   . Hypertension Father   . Prostate cancer Father   . Liver cancer Maternal Grandmother   .  Breast cancer Paternal Grandmother     dx in her 68s  . Lung cancer Paternal Aunt     smoker  . Heart attack Paternal Grandfather   . Liver cancer Other   The patient's parents are alive, in their mid 61s as of November 2017. The patient father was  diagnosed with prostate cancer at age 64. A paternal aunt was diagnosed with lung cancer in her late 83s. Also the paternal grandmother was diagnosed with breast cancer at the age of 62. On the mother's side there are 2 cases of liver cancer, age of diagnosis unknown.  GYNECOLOGIC HISTORY:  No LMP recorded.  Menarche age 19, first live birth age 78, she is [place] P2. She still having periods, most recently 01/21/2016, but they are no longer regular. She remotely used a number ring and oral contraceptives but stopped in 2016.Currently there use barrier methods for contraception.   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 Her husband Virginia [name] works as a Printmaker for [employer] (for example he worked on our (). Their children are [name] and [name],, aged 37 and 2 as of November 2017. Attention and is not a church attender     ADVANCED DIRECTIVES: Not in place   HEALTH MAINTENANCE: Social History  Substance Use Topics  . Smoking status: Never Smoker  . Smokeless tobacco: Never Used  . Alcohol use Yes     Colonoscopy:  PAP:2016  Bone density:   Allergies  Allergen Reactions  . Amoxicillin Nausea Only    diarrhea    Current Outpatient Prescriptions  Medication Sig Dispense Refill  . ibuprofen (ADVIL,MOTRIN) 800 MG tablet Take 1 tablet (800 mg total) by mouth 3 (three) times daily. 30 tablet 0  . Prenatal Vit-Fe Fumarate-FA (PRENATAL MULTIVITAMIN) TABS tablet Take 1 tablet by mouth at bedtime.    . tamoxifen (NOLVADEX) 20 MG tablet Take 1 tablet (20 mg total) by mouth daily. 90 tablet 12   No current facility-administered medications for this visit.     OBJECTIVE: Young-appearing woman Who appears well Vitals:   02/15/16 0932  BP: 129/76  Pulse: 71  Resp: 17  Temp: 98.4 F (36.9 C)     Body mass index is 29.67 kg/m.    ECOG FS:0 - Asymptomatic  Sclerae unicteric, pupils round and equal Oropharynx clear and  moist-- no thrush or other lesions No cervical or supraclavicular adenopathy Lungs no rales or rhonchi Heart regular rate and rhythm Abd soft, nontender, positive bowel sounds MSK no focal spinal tenderness, no upper extremity lymphedema Neuro: nonfocal, well oriented, appropriate affect Breasts: The right breast is benign. Left breast is status post recent biopsy. I do not palpate a mass and I do not see any skin or nipple changes of concern. The left axilla is benign.    LAB RESULTS:  CMP     Component Value Date/Time   NA 141 01/23/2016 0836   K 3.8 01/23/2016 0836   CL 104 07/16/2014 2236   CO2 25 01/23/2016 0836   GLUCOSE 95 01/23/2016 0836   BUN 7.3 01/23/2016 0836   CREATININE 0.8 01/23/2016 0836   CALCIUM 9.4 01/23/2016 0836   PROT 7.5 01/23/2016 0836   ALBUMIN 3.9 01/23/2016 0836   AST 18 01/23/2016 0836   ALT 27 01/23/2016 0836   ALKPHOS 47 01/23/2016 0836   BILITOT 0.34 01/23/2016 0836   GFRNONAA >60 07/16/2014 2236   GFRAA >60 07/16/2014 2236  INo results found for: SPEP, UPEP  Lab Results  Component Value Date   WBC 4.1 01/23/2016   NEUTROABS 1.9 01/23/2016   HGB 12.7 01/23/2016   HCT 39.8 01/23/2016   MCV 84.6 01/23/2016   PLT 190 01/23/2016      Chemistry      Component Value Date/Time   NA 141 01/23/2016 0836   K 3.8 01/23/2016 0836   CL 104 07/16/2014 2236   CO2 25 01/23/2016 0836   BUN 7.3 01/23/2016 0836   CREATININE 0.8 01/23/2016 0836      Component Value Date/Time   CALCIUM 9.4 01/23/2016 0836   ALKPHOS 47 01/23/2016 0836   AST 18 01/23/2016 0836   ALT 27 01/23/2016 0836   BILITOT 0.34 01/23/2016 0836       No results found for: LABCA2  No components found for: LABCA125  No results for input(s): INR in the last 168 hours.  Urinalysis    Component Value Date/Time   COLORURINE YELLOW 10/14/2012 1954   APPEARANCEUR CLEAR 10/14/2012 1954   LABSPEC >1.030 (H) 10/14/2012 1954   PHURINE 6.0 10/14/2012 1954   GLUCOSEU  NEGATIVE 10/14/2012 1954   HGBUR TRACE (A) 10/14/2012 [place] NEGATIVE 10/14/2012 1954   KETONESUR 15 (A) 10/14/2012 1954   PROTEINUR NEGATIVE 10/14/2012 1954   UROBILINOGEN 0.2 10/14/2012 1954   NITRITE NEGATIVE 10/14/2012 1954   LEUKOCYTESUR NEGATIVE 10/14/2012 1954     STUDIES: Mr Breast Bilateral W Wo Contrast  Result Date: 01/29/2016 CLINICAL DATA:  Recent biopsy-proven invasive mammary carcinoma and mammary carcinoma in situ within the left breast at the 10 o'clock axis, located at anterior depth per pathology report. Additional recent biopsy-proven atypical ductal hyperplasia within the left breast at the 10 o'clock axis, located at central middle depth per pathology report. LABS:  Creatinine 0.8, GFR 60 EXAM: BILATERAL BREAST MRI WITH AND WITHOUT CONTRAST TECHNIQUE: Multiplanar, multisequence MR images of both breasts were obtained prior to and following the intravenous administration of 17 ml of MultiHance. THREE-DIMENSIONAL MR IMAGE RENDERING ON INDEPENDENT WORKSTATION: Three-dimensional MR images were rendered by post-processing of the original MR data on an independent workstation. The three-dimensional MR images were interpreted, and findings are reported in the following complete MRI report for this study. Three dimensional images were evaluated at the independent DynaCad workstation COMPARISON:  Previous exam(s). FINDINGS: Breast composition: b. Scattered fibroglandular tissue. Background parenchymal enhancement: Minimal Right breast: No mass or abnormal enhancement. Left breast: Irregular enhancing mass within the slightly inner left breast, 10 o'clock axis region, at middle depth measuring 0.9 x 0.7 x 0.9 cm, with associated biopsy clip artifact, corresponding to the biopsy-proven invasive mammary carcinoma (series 10601, image 141). Additional slightly irregular mass, slightly posterior and inferior to the biopsy-proven invasive carcinoma, measuring 0.8 x 0.6 x 0.7 cm, with  associated biopsy clip artifact, consistent with the biopsy proven ADH (series 10601, image 151). Additional clumped non mass enhancement extends anterior from the biopsy-proven ADH, demonstrating suspicious enhancement kinetics with some washout (series 10601, image 150). The combination of biopsy-proven ADH and contiguous suspicious non mass enhancement extending anteriorly measures 4.1 cm AP dimension and 1.6 cm transverse dimension. The superior extent of this suspicious non mass enhancement abuts the inferior margin of the biopsy-proven invasive carcinoma. Altogether, the biopsy-proven invasive carcinoma, biopsy-proven ADH and additional suspicious non mass enhancement measures 2 cm craniocaudal extent. Lymph nodes: No abnormal appearing lymph nodes. Ancillary findings:  None. IMPRESSION: 1. Biopsy-proven invasive mammary carcinoma and biopsy-proven ADH within the  LEFT breast at the 10 o'clock axis (invasive carcinoma slightly superior and anterior to the ADH). 2. Additional non mass enhancement within the central retroareolar left breast, highly suspicious for additional disease extent. This additional highly suspicious non mass enhancement extends anteriorly from the biopsy-proven ADH and is also contiguous with the inferior aspect of the overlying biopsy-proven invasive carcinoma. Overall, the combination of biopsy-proven invasive carcinoma, biopsy-proven ADH and the contiguous suspicious non mass enhancement measure 4.1 cm AP dimension x 1.6 cm transverse dimension x 2 cm craniocaudal extent. 3. No evidence of malignancy within the right breast. RECOMMENDATION: If breast conservation therapy is considered, recommend MRI-guided biopsy of the most anterior portion of the additional suspicious non mass enhancement within the left breast to define extent of disease. BI-RADS CATEGORY  4: Suspicious. Biopsy-proven invasive carcinoma and ADH within the left breast. Additional suspicious non mass enhancement within  the left breast. Electronically Signed   By: Franki Cabot M.D.   On: 01/29/2016 11:27     ELIGIBLE FOR AVAILABLE RESEARCH PROTOCOL: no  ASSESSMENT: 35 y.o. [place] woman status post left breast upper inner quadrant biopsy 01/15/2016 for a clinical T1c N0, stage IA invasive ductal carcinoma, E-cadherin positive, estrogen receptor 90% positive, progesterone receptor 40% positive, both with moderate staining intensity, with an MIB-1 of 5%, but HER-2 amplified, with a signals ratio of 2.41 and the number per cell 6.50.  (a) One of 2 areas of abnormal calcifications, the more posterior one measuring 0.2 cm, was also biopsied and showed only atypical ductal hyperplasia  (b) a 4.1 cm area of non-masslike enhancement in the left breast is scheduled for biopsy 02/19/2016  (1) tamoxifen started 01/23/2016 neoadjuvantly pending a definitive surgical decision  (2) genetics testing in process: The Breast/Ovarian gene panel offered by [facility] includes sequencing and rearrangement analysis for the following 20 genes:  ATM, BARD1, BRCA1, BRCA2, BRIP1, CDH1, CHEK2, EPCAM, FANCC, MLH1, MSH2, MSH6, NBN, PALB2, PMS2, PTEN, RAD51C, RAD51D, TP53, and XRCC2.     (3) definitive surgery to follow  (4) paclitaxel weekly 12 and trastuzumab every 21 days to follow surgery  (5) trastuzumab to be continued to complete a year  (a) echocardiogram 02/11/2016 shows ejection fraction in the 60-65% range.  (6) adjuvant radiation to follow chemotherapy  (7) anti-estrogens to resume at the completion of local treatment, to be continued for 5-10 years perfect slightly  PLAN: Stacy's situation is complex chiefly because so much is still pending. I showed her the MRI images and she can see the area of non-masslike enhancement that needs to be biopsied. If this proves to be in situ cancer, then whether or not she will be able to keep her breasts will be very much in question, since chemotherapy would not be expected to affect  this area. On the other hand in younger women we frequently do see transient breast changes and it may be that this area will have completely resolved by the time of biopsy  We are also waiting of her genetics testing. She is not sure whether or not she would want bilateral mastectomies even if she carried a genetics mutation, but she doesn't feel she can make that decision until she has to data on hand and that is very understandable  Accordingly she is proceeding to biopsy next week. She is continuing on tamoxifen. She is using barrier contraception. Her periods are still present although irregular. She understands that tamoxifen may interrupt, change, or not make any difference to her periods and that it  is not a contraceptive.  I anticipate we will have most of the data for her by early January, that she will see her surgeon and go ahead and have her surgery in mid January, and she will have a port placed at that time. She will then see me 03/24/2016 and at that point I expect to be able to set her up for her chemotherapy treatments, which will consist of paclitaxel weekly 12. We will start the trastuzumab at the same time and that will be given every 21 days  Virginia [name] has a good understanding of this plan. She agrees with it. She knows the goal of treatment in her case is cure. She will call with any problems that may develop before her next visit here.   Chauncey Cruel, MD   02/16/2016 11:23 AM Medical Oncology and Hematology [facility] [address] [place], [place] [address] Tel. [phone]    Fax. [phone]

## 2016-02-19 ENCOUNTER — Ambulatory Visit
Admission: RE | Admit: 2016-02-19 | Discharge: 2016-02-19 | Disposition: A | Discharge status: Home / Self Care | Payer: PRIVATE HEALTH INSURANCE | Source: Ambulatory Visit | Attending: General Surgery | Admitting: General Surgery

## 2016-02-19 DIAGNOSIS — N6320 Unspecified lump in the left breast, unspecified quadrant: Secondary | ICD-10-CM

## 2016-02-19 MED ORDER — GADOBENATE DIMEGLUMINE 529 MG/ML IV SOLN
16.0000 mL | Freq: Once | INTRAVENOUS | Status: AC | PRN
  Administered 2016-02-19: 16 mL via INTRAVENOUS

## 2016-02-20 ENCOUNTER — Telehealth: Payer: Self-pay | Admitting: Genetic Counselor

## 2016-02-20 NOTE — Telephone Encounter (Signed)
Discussed with patient that the test results have been delayed.  Explained that it was not due to the sample itself, but instead on a error in the laboratory processes.  They have a TAT time set for 02/26/2016, but are trying to get it out before the new year.  I will call ASAP when the result comes through.

## 2016-02-24 ENCOUNTER — Other Ambulatory Visit: Payer: Self-pay | Admitting: Oncology

## 2016-02-26 ENCOUNTER — Encounter: Payer: Self-pay | Admitting: Genetic Counselor

## 2016-02-26 ENCOUNTER — Ambulatory Visit: Payer: Self-pay | Admitting: Genetic Counselor

## 2016-02-26 ENCOUNTER — Telehealth: Payer: Self-pay | Admitting: Genetic Counselor

## 2016-02-26 DIAGNOSIS — C50212 Malignant neoplasm of upper-inner quadrant of left female breast: Secondary | ICD-10-CM

## 2016-02-26 DIAGNOSIS — Z1379 Encounter for other screening for genetic and chromosomal anomalies: Secondary | ICD-10-CM | POA: Insufficient documentation

## 2016-02-26 DIAGNOSIS — Z8042 Family history of malignant neoplasm of prostate: Secondary | ICD-10-CM

## 2016-02-26 DIAGNOSIS — Z803 Family history of malignant neoplasm of breast: Secondary | ICD-10-CM

## 2016-02-26 NOTE — Telephone Encounter (Signed)
Revealed negative genetic testing on the Breast/Ovarian cancer panel, which also included HOXB13 (prostate cancer risk).  Discussed that we do not know why she developed breast cancer.  Her daughter should be screened starting 10 years younger than her age of onset, although her daughter is now 2 and there may be many changes between now and when her daughter would need to be screened. Discussed keeping in contact with genetics over time so that if updated testing was needed, we can offer it.

## 2016-02-26 NOTE — Progress Notes (Signed)
HPI: Ms. Rezek was previously seen in the [place] clinic due to a personal history of breast cancer and family history of cancer and concerns regarding a hereditary predisposition to cancer. Please refer to our prior cancer genetics clinic note for more information regarding Ms. Corea's medical, social and family histories, and our assessment and recommendations, at the time. Ms. Pennick recent genetic test results were disclosed to her, as were recommendations warranted by these results. These results and recommendations are discussed in more detail below.  CANCER HISTORY:    Malignant neoplasm of upper-inner quadrant of left female breast ([place])   01/16/2016 Initial Diagnosis    Malignant neoplasm of upper-inner quadrant of left female breast ([place])     02/22/2016 Genetic Testing    Patient has genetic testing done for breast cancer. Negative genetic testing on a custom breast cancer panel.  Negative genetic testing for the MSH2 inversion analysis ([place] inversion). The Custom gene panel offered by [facility] includes sequencing and rearrangement analysis for the following 21 genes:  ATM, BARD1, BRCA1, BRCA2, BRIP1, CDH1, CHEK2, EPCAM, FANCC, HOXB13, MLH1, MSH2, MSH6, NBN, PALB2, PMS2, PTEN, RAD51C, RAD51D, TP53, and XRCC2.   The report date is February 22, 2016.       FAMILY HISTORY:  We obtained a detailed, 4-generation family history.  Significant diagnoses are listed below: Family History  Problem Relation Age of Onset  . Hypertension Mother   . Hypertension Father   . Prostate cancer Father   . Liver cancer Maternal Grandmother   . Breast cancer Paternal Grandmother     dx in her 35s  . Lung cancer Paternal Aunt     smoker  . Heart attack Paternal Grandfather   . Liver cancer Other      The patient has two young children who are cancer free.  She has one full brother who is cancer free.  Her mother is adopted, but she met a brother who is healthy.   The patient's mother's mother had a drug and alcohol problem and died of liver cancer, as did her mother's brother.    The patient's father was diagnosed with prostate cancer at 64.  He has three brothers and two sisters.  One sister had lung cancer due to smoking.  His mother was diagnosed with breast cancer in her 76's.  Patient's maternal ancestors are of [place] and [place] descent, and paternal ancestors are of [place] and [place] descent. There is no reported Ashkenazi Jewish ancestry. There is no known consanguinity.  GENETIC TEST RESULTS: Genetic testing reported out on February 22, 2016 through the Breast/Ovarian cancer panel found no deleterious mutations.  The Breast/Ovarian gene panel offered by [facility] includes sequencing and rearrangement analysis for the following 20 genes:  ATM, BARD1, BRCA1, BRCA2, BRIP1, CDH1, CHEK2, EPCAM, FANCC, MLH1, MSH2, MSH6, NBN, PALB2, PMS2, PTEN, RAD51C, RAD51D, TP53, and XRCC2.   Negative genetic testing for the MSH2 inversion analysis (B[place] inversion).  The test report has been scanned into EPIC and is located under the Molecular Pathology section of the Results Review tab.   We discussed with Ms. Meiners that since the current genetic testing is not perfect, it is possible there may be a gene mutation in one of these genes that current testing cannot detect, but that chance is small. We also discussed, that it is possible that another gene that has not yet been discovered, or that we have not yet tested, is responsible for the cancer diagnoses  in the family, and it is, therefore, important to remain in touch with cancer genetics in the future so that we can continue to offer Ms. Glascoe the most up to date genetic testing.   CANCER SCREENING RECOMMENDATIONS: This result is reassuring and indicates that Ms. Henton likely does not have an increased risk for a future cancer due to a mutation in one of these genes. This normal  test also suggests that Ms. Hansell's cancer was most likely not due to an inherited predisposition associated with one of these genes.  Most cancers happen by chance and this negative test suggests that her cancer falls into this category.  We, therefore, recommended she continue to follow the cancer management and screening guidelines provided by her oncology and primary healthcare provider.   RECOMMENDATIONS FOR FAMILY MEMBERS: Women in this family might be at some increased risk of developing cancer, over the general population risk, simply due to the family history of cancer. We recommended women in this family have a yearly mammogram beginning at age 43, or 62 years younger than the earliest onset of cancer, an annual clinical breast exam, and perform monthly breast self-exams. Women in this family should also have a gynecological exam as recommended by their primary provider. All family members should have a colonoscopy by age 42.  FOLLOW-UP: Lastly, we discussed with Ms. Calender that cancer genetics is a rapidly advancing field and it is possible that new genetic tests will be appropriate for her and/or her family members in the future. We encouraged her to remain in contact with cancer genetics on an annual basis so we can update her personal and family histories and let her know of advances in cancer genetics that may benefit this family.   Our contact number was provided. Ms. Messineo questions were answered to her satisfaction, and she knows she is welcome to call us at anytime with additional questions or concerns.   Roma Kayser, MS, [facility] Certified Genetic Counselor [email or web address]

## 2016-02-29 ENCOUNTER — Telehealth: Payer: Self-pay | Admitting: General Surgery

## 2016-02-29 ENCOUNTER — Other Ambulatory Visit: Payer: Self-pay | Admitting: General Surgery

## 2016-02-29 DIAGNOSIS — Z170 Estrogen receptor positive status [ER+]: Secondary | ICD-10-CM

## 2016-02-29 DIAGNOSIS — C50212 Malignant neoplasm of upper-inner quadrant of left female breast: Secondary | ICD-10-CM

## 2016-02-29 NOTE — Telephone Encounter (Signed)
Discussed pathology/imaging with radiology and patient.  Plan 2 lumpectomies on left side with SLN bx and port placement.  We discussed that cancer may be more extensive than thought and we may need to do additional surgery if margins come back positive.

## 2016-03-07 ENCOUNTER — Encounter (HOSPITAL_COMMUNITY): Payer: Self-pay

## 2016-03-07 ENCOUNTER — Encounter (HOSPITAL_COMMUNITY)
Admission: RE | Admit: 2016-03-07 | Discharge: 2016-03-07 | Disposition: A | Discharge status: Home / Self Care | Payer: PRIVATE HEALTH INSURANCE | Source: Ambulatory Visit | Attending: General Surgery | Admitting: General Surgery

## 2016-03-07 DIAGNOSIS — Z01818 Encounter for other preprocedural examination: Secondary | ICD-10-CM | POA: Diagnosis not present

## 2016-03-07 DIAGNOSIS — D649 Anemia, unspecified: Secondary | ICD-10-CM | POA: Diagnosis present

## 2016-03-07 HISTORY — DX: Abnormal levels of other serum enzymes: R74.8

## 2016-03-07 HISTORY — DX: Gastro-esophageal reflux disease without esophagitis: K21.9

## 2016-03-07 HISTORY — DX: Anemia, unspecified: D64.9

## 2016-03-07 LAB — COMPREHENSIVE METABOLIC PANEL
ALT: 30 U/L (ref 14–54)
AST: 24 U/L (ref 15–41)
Albumin: 4 g/dL (ref 3.5–5.0)
Alkaline Phosphatase: 44 U/L (ref 38–126)
Anion gap: 6 (ref 5–15)
BUN: 7 mg/dL (ref 6–20)
CO2: 26 mmol/L (ref 22–32)
Calcium: 9.5 mg/dL (ref 8.9–10.3)
Chloride: 107 mmol/L (ref 101–111)
Creatinine, Ser: 0.87 mg/dL (ref 0.44–1.00)
GFR calc Af Amer: >60 mL/min (ref >60)
GFR calc non Af Amer: >60 mL/min (ref >60)
Glucose, Bld: 102 mg/dL — ABNORMAL HIGH (ref 65–99)
Potassium: 4.5 mmol/L (ref 3.5–5.1)
Sodium: 139 mmol/L (ref 135–145)
Total Bilirubin: 0.3 mg/dL (ref 0.3–1.2)
Total Protein: 7.1 g/dL (ref 6.5–8.1)

## 2016-03-07 LAB — CBC
HCT: 39.1 % (ref 36.0–46.0)
Hemoglobin: 12.4 g/dL (ref 12.0–15.0)
MCH: 26.7 pg (ref 26.0–34.0)
MCHC: 31.7 g/dL (ref 30.0–36.0)
MCV: 84.3 fL (ref 78.0–100.0)
Platelets: 202 10*3/uL (ref 150–400)
RBC: 4.64 MIL/uL (ref 3.87–5.11)
RDW: 13 % (ref 11.5–15.5)
WBC: 5.8 10*3/uL (ref 4.0–10.5)

## 2016-03-07 LAB — HCG, SERUM, QUALITATIVE: Preg, Serum: NEGATIVE

## 2016-03-07 NOTE — Pre-Procedure Instructions (Signed)
    Virginia [name]  03/07/2016      [id number] [place], [place] - [address] [place] [address] Phone: [phone] Fax: [phone]    Your procedure is scheduled on Thursday, March 13, 2016  Report to [facility] Admitting at 10:30 A.M.  Call this number if you have problems the morning of surgery:  [phone]   Remember:  Do not eat food or drink liquids after midnight Wednesday, March 12, 2016  Take these medicines the morning of surgery with A SIP OF WATER : tamoxifen (NOLVADEX), if needed: Tylenol for headaches, fluticasone (FLONASE)  nasal spray for allergies Stop taking Aspirin, vitamins, fish oil, and herbal medications. Do not take any NSAIDs ie: Ibuprofen, Advil, Naproxen, BC and Goody Powder or any medication containing Aspirin; stop now.  Do not wear jewelry, make-up or nail polish.  Do not wear lotions, powders, or perfumes, or deoderant.  Do not shave 48 hours prior to surgery.    Do not bring valuables to the hospital.  [facility] is not responsible for any belongings or valuables.  Contacts, dentures or bridgework may not be worn into surgery.  Leave your suitcase in the car.  After surgery it may be brought to your room.  Patients discharged the day of surgery will not be allowed to drive home.  Special instructions: Shower the night before surgery and the morning of with CHG. Please read over the following fact sheets that you were given. Pain Booklet, Coughing and Deep Breathing and Surgical Site Infection Prevention

## 2016-03-07 NOTE — Progress Notes (Signed)
Pt denies SOB, chest pain, and being under the care of a cardiologist. Pt denies having a stress test, and cardiac cath. Pt denies having a chest x ray and EKG within the last year. Pt given Boost Breeze and instructed to finish drink 2 hours prior to arrival on DOS per ERAS protocol. Pt verbalized understanding of all pre-op instructions.

## 2016-03-11 NOTE — H&P (Signed)
Virginia [name]  Location: [facility] Patient #: [id number] DOB: [birth year] Undefined / Language: Cleophus Molt / Race: Black or African American Female   History of Present Illness  The patient is a 36 year old female who presents with breast cancer. Pt is a 36 yo F who is here in consultation at the request of Dr. Jacelyn Grip for a new diagnosis of left breast cancer. She presented with screening detected calcifications and mass in the upper inner quadrant. Diagnostic mammogram showed 8 mm area of calcifications in association with the mass, a 5 mm area of calcifications, and a 2 mm area of calcifications that was more anterior. Total area of abnormality was 3 cm. 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 Ki 67 was 5%. The more posterior area of calcifications was negative for malignancy. The clips were 2.2 cm apart.  She has a family history of breast cancer in her paternal grandmother at age 30. Father had prostate cancer at age 52. Paternal aunt had lung cancer in her late 59s. Maternal grandmother and maternal great uncle had liver cancer. She had menarche at age 60. She is a G2P2 with a >2 year history of breastfeeding. First child was born in her early 69s. She has used OCPs and a hormonal contraceptive implant.   pathology Diagnosis Breast, left, needle core biopsy, mass 10 o'clock ant depth - INVASIVE MAMMARY CARCINOMA. - MAMMARY CARCINOMA IN SITU. Immunohistochemistry for E-cadherin shows diffuse strong positivity consistent with ductal carcinoma.  Diagnosis Breast, left, needle core biopsy, calcifications 10 o'clock- central middle depth - FOCAL ATYPICAL DUCTAL HYPERPLASIA.  Labs CBC, Cmet normal.    Other Problems Breast Cancer  Chest pain  Lump In Breast  Migraine Headache  Thyroid Disease   Past Surgical History Breast Biopsy   Left.  Diagnostic Studies History  Colonoscopy  never Mammogram  within last year Pap Smear  1-5 years ago  Social History  Alcohol use  Occasional alcohol use. Caffeine use  Coffee. No drug use  Tobacco use  Never smoker.  Family History Alcohol Abuse  Family Members In General, Mother. Breast Cancer  Family Members In General. Cancer  Family Members In General. Cerebrovascular Accident  Father. Depression  Mother. Diabetes Mellitus  Family Members In General. Heart Disease  Family Members In General. Heart disease in female family member before age 84  Hypertension  Father, Mother. Prostate Cancer  Father. Respiratory Condition  Family Members In General.  Pregnancy / Birth History  Age at menarche  81 years. Contraceptive History  Contraceptive implant, Oral contraceptives. Gravida  2 Length (months) of breastfeeding  >24 Maternal age  25-25 Para  2 Regular periods     Review of Systems General Not Present- Appetite Loss, Chills, Fatigue, Fever, Night Sweats, Weight Gain and Weight Loss. Skin Not Present- Change in Wart/Mole, Dryness, Hives, Jaundice, New Lesions, Non-Healing Wounds, Rash and Ulcer. HEENT Present- Seasonal Allergies and Wears glasses/contact lenses. Not Present- Earache, Hearing Loss, Hoarseness, Nose Bleed, Oral Ulcers, Ringing in the Ears, Sinus Pain, Sore Throat, Visual Disturbances and Yellow Eyes. Respiratory Present- Snoring. Not Present- Bloody sputum, Chronic Cough, Difficulty Breathing and Wheezing. Breast Present- Nipple Discharge. Not Present- Breast Mass, Breast Pain and Skin Changes. Cardiovascular Not Present- Chest Pain, Difficulty Breathing Lying Down, Leg Cramps, Palpitations, Rapid Heart Rate, Shortness of Breath and Swelling of Extremities. Gastrointestinal Not Present- Abdominal Pain, Bloating, Bloody  Stool, Change in Bowel Habits, Chronic diarrhea, Constipation, Difficulty Swallowing, Excessive gas, Gets  full quickly at meals, Hemorrhoids, Indigestion, Nausea, Rectal Pain and Vomiting. Female Genitourinary Not Present- Frequency, Nocturia, Painful Urination, Pelvic Pain and Urgency. Musculoskeletal Not Present- Back Pain, Joint Pain, Joint Stiffness, Muscle Pain, Muscle Weakness and Swelling of Extremities. Neurological Present- Headaches. Not Present- Decreased Memory, Fainting, Numbness, Seizures, Tingling, Tremor, Trouble walking and Weakness. Psychiatric Present- Change in Sleep Pattern. Not Present- Anxiety, Bipolar, Depression, Fearful and Frequent crying. Endocrine Not Present- Cold Intolerance, Excessive Hunger, Hair Changes, Heat Intolerance, Hot flashes and New Diabetes. Hematology Not Present- Blood Thinners, Easy Bruising, Excessive bleeding, Gland problems, HIV and Persistent Infections.  Vitals   Weight: 186.6 lb Height: 66in Body Surface Area: 1.94 m Body Mass Index: 30.12 kg/m  Temp.: 98.23F  Pulse: 89 (Regular)  Resp.: 18 (Unlabored)  BP: 130/77 (Sitting, Left Arm, Standard)     Physical Exam  General Mental Status-Alert. General Appearance-Consistent with stated age. Hydration-Well hydrated. Voice-Normal.  Head and Neck Head-normocephalic, atraumatic with no lesions or palpable masses. Trachea-midline. Thyroid Gland Characteristics - normal size and consistency.  Eye Eyeball - Bilateral-Extraocular movements intact. Sclera/Conjunctiva - Bilateral-No scleral icterus.  Chest and Lung Exam Chest and lung exam reveals -quiet, even and easy respiratory effort with no use of accessory muscles and on auscultation, normal breath sounds, no adventitious sounds and normal vocal resonance. Inspection Chest Wall - Normal. Back - normal.  Breast Note: small hematoma near nipple. no palpable masses or skin dimpling. No nipple retraction or discharge. No lymphadenopathy.   Cardiovascular Cardiovascular examination reveals -normal  heart sounds, regular rate and rhythm with no murmurs and normal pedal pulses bilaterally.  Abdomen Inspection Inspection of the abdomen reveals - No Hernias. Palpation/Percussion Palpation and Percussion of the abdomen reveal - Soft, Non Tender, No Rebound tenderness, No Rigidity (guarding) and No hepatosplenomegaly. Auscultation Auscultation of the abdomen reveals - Bowel sounds normal.  Neurologic Neurologic evaluation reveals -alert and oriented x 3 with no impairment of recent or remote memory. Mental Status-Normal.  Musculoskeletal Global Assessment -Note: no gross deformities.  Normal Exam - Left-Upper Extremity Strength Normal and Lower Extremity Strength Normal. Normal Exam - Right-Upper Extremity Strength Normal and Lower Extremity Strength Normal.  Lymphatic Head & Neck  General Head & Neck Lymphatics: Bilateral - Description - Normal. Axillary  General Axillary Region: Bilateral - Description - Normal. Tenderness - Non Tender. Femoral & Inguinal  Generalized Femoral & Inguinal Lymphatics: Bilateral - Description - No Generalized lymphadenopathy.    Assessment & Plan  MALIGNANT NEOPLASM OF UPPER-INNER QUADRANT OF LEFT BREAST IN FEMALE, ESTROGEN RECEPTOR POSITIVE (C50.212) Impression: She has a new diagnosis of a cT1bN0 left breast cancer, triple positive. This is complicated by the additional suspicious area of calcifications anteriorly. She will be recommended to get MRI due to age and breast density if she desires breast conservation.  She will need genetic evaluation due to her young age and family history of breast cancer.  Plan will be bilateral mastectomies if genetics positive. If MRI shows other findings, she may need additional biopsies. She will be recommended to get chemo due to Her 2 status. We can start neoadjuvant tamoxifen while we wait for MRI and genetics. If she desires breast conservation, she may need additional biopsy of the anterior  calcifications. We will discuss with radiology after MRI findings.  I discussed lumpectomy and sentinel lymph node wtih patient. We reviewed the course of surgery, placement of seed, pectoral block, and sentinel  node injection. I discussed port placement as well. I reviewed anatomy and showed the port itself. I discussed risks of surgery. If genetics are positive, or if she has MRI findings necessitating mastectomy, we will refer to plastic surgery and have her come back to see me.  She is an Chief Strategy Officer and expressed understanding.  60 min spent in evaluation, examination, counseling, and coordination of care. >50% spent in counseling. FAMILY HISTORY OF BREAST CANCER (Z80.3) Impression: Genetic counseling and screening recommended.    Signed by Stark Klein, MD

## 2016-03-13 ENCOUNTER — Ambulatory Visit (HOSPITAL_COMMUNITY)
Admission: RE | Admit: 2016-03-13 | Discharge: 2016-03-13 | Disposition: A | Discharge status: Home / Self Care | Payer: PRIVATE HEALTH INSURANCE | Source: Ambulatory Visit | Attending: General Surgery | Admitting: General Surgery

## 2016-03-13 ENCOUNTER — Encounter (HOSPITAL_COMMUNITY)
Admission: RE | Disposition: A | Discharge status: Home / Self Care | Payer: Self-pay | Source: Ambulatory Visit | Attending: General Surgery

## 2016-03-13 ENCOUNTER — Ambulatory Visit (HOSPITAL_COMMUNITY): Payer: PRIVATE HEALTH INSURANCE | Admitting: Anesthesiology

## 2016-03-13 ENCOUNTER — Encounter (HOSPITAL_COMMUNITY)
Admission: RE | Admit: 2016-03-13 | Discharge: 2016-03-13 | Disposition: A | Discharge status: Home / Self Care | Payer: PRIVATE HEALTH INSURANCE | Source: Ambulatory Visit | Attending: General Surgery | Admitting: General Surgery

## 2016-03-13 ENCOUNTER — Encounter (HOSPITAL_COMMUNITY): Payer: Self-pay | Admitting: Anesthesiology

## 2016-03-13 ENCOUNTER — Ambulatory Visit (HOSPITAL_COMMUNITY): Payer: PRIVATE HEALTH INSURANCE

## 2016-03-13 DIAGNOSIS — Z170 Estrogen receptor positive status [ER+]: Secondary | ICD-10-CM | POA: Insufficient documentation

## 2016-03-13 DIAGNOSIS — Z800 Family history of malignant neoplasm of digestive organs: Secondary | ICD-10-CM | POA: Insufficient documentation

## 2016-03-13 DIAGNOSIS — Z880 Allergy status to penicillin: Secondary | ICD-10-CM | POA: Diagnosis not present

## 2016-03-13 DIAGNOSIS — Z801 Family history of malignant neoplasm of trachea, bronchus and lung: Secondary | ICD-10-CM | POA: Insufficient documentation

## 2016-03-13 DIAGNOSIS — C50212 Malignant neoplasm of upper-inner quadrant of left female breast: Secondary | ICD-10-CM

## 2016-03-13 DIAGNOSIS — Z8042 Family history of malignant neoplasm of prostate: Secondary | ICD-10-CM | POA: Insufficient documentation

## 2016-03-13 DIAGNOSIS — Z803 Family history of malignant neoplasm of breast: Secondary | ICD-10-CM | POA: Insufficient documentation

## 2016-03-13 DIAGNOSIS — K219 Gastro-esophageal reflux disease without esophagitis: Secondary | ICD-10-CM | POA: Diagnosis not present

## 2016-03-13 DIAGNOSIS — E039 Hypothyroidism, unspecified: Secondary | ICD-10-CM | POA: Insufficient documentation

## 2016-03-13 DIAGNOSIS — F419 Anxiety disorder, unspecified: Secondary | ICD-10-CM | POA: Diagnosis not present

## 2016-03-13 DIAGNOSIS — Z419 Encounter for procedure for purposes other than remedying health state, unspecified: Secondary | ICD-10-CM

## 2016-03-13 HISTORY — PX: BREAST LUMPECTOMY WITH RADIOACTIVE SEED AND SENTINEL LYMPH NODE BIOPSY: SHX6550

## 2016-03-13 HISTORY — PX: PORTACATH PLACEMENT: SHX2246

## 2016-03-13 SURGERY — BREAST LUMPECTOMY WITH RADIOACTIVE SEED AND SENTINEL LYMPH NODE BIOPSY
Anesthesia: General | Site: Chest | Laterality: Right

## 2016-03-13 MED ORDER — OXYCODONE HCL 5 MG PO TABS
ORAL_TABLET | ORAL | Status: AC
  Filled 2016-03-13: qty 1

## 2016-03-13 MED ORDER — HYDROMORPHONE HCL 1 MG/ML IJ SOLN
INTRAMUSCULAR | Status: AC
  Filled 2016-03-13: qty 0.5

## 2016-03-13 MED ORDER — SODIUM CHLORIDE 0.9 % IJ SOLN
INTRAMUSCULAR | Status: DC | PRN
  Administered 2016-03-13: 20 mL

## 2016-03-13 MED ORDER — CHLORHEXIDINE GLUCONATE CLOTH 2 % EX PADS: 6.0000 | MEDICATED_PAD | Freq: Once | CUTANEOUS | Status: DC

## 2016-03-13 MED ORDER — SODIUM CHLORIDE 0.9% FLUSH: 3.0000 mL | INTRAVENOUS | Status: DC | PRN

## 2016-03-13 MED ORDER — HEPARIN SOD (PORK) LOCK FLUSH 100 UNIT/ML IV SOLN
INTRAVENOUS | Status: AC
  Filled 2016-03-13: qty 5

## 2016-03-13 MED ORDER — CEFAZOLIN SODIUM 1 G IJ SOLR
INTRAMUSCULAR | Status: DC | PRN
  Administered 2016-03-13: 2 g via INTRAMUSCULAR

## 2016-03-13 MED ORDER — LIDOCAINE HCL (CARDIAC) 20 MG/ML IV SOLN
INTRAVENOUS | Status: DC | PRN
  Administered 2016-03-13: 100 mg via INTRAVENOUS

## 2016-03-13 MED ORDER — METHYLENE BLUE 0.5 % INJ SOLN
INTRAVENOUS | Status: AC
  Filled 2016-03-13: qty 10

## 2016-03-13 MED ORDER — FENTANYL CITRATE (PF) 100 MCG/2ML IJ SOLN
INTRAMUSCULAR | Status: DC | PRN
  Administered 2016-03-13 (×2): 50 ug via INTRAVENOUS

## 2016-03-13 MED ORDER — 0.9 % SODIUM CHLORIDE (POUR BTL) OPTIME
TOPICAL | Status: DC | PRN
  Administered 2016-03-13: 1000 mL

## 2016-03-13 MED ORDER — DEXAMETHASONE SODIUM PHOSPHATE 10 MG/ML IJ SOLN
INTRAMUSCULAR | Status: AC
  Filled 2016-03-13: qty 1

## 2016-03-13 MED ORDER — PROPOFOL 10 MG/ML IV BOLUS
INTRAVENOUS | Status: DC | PRN
  Administered 2016-03-13: 20 mg via INTRAVENOUS
  Administered 2016-03-13: 50 mg via INTRAVENOUS
  Administered 2016-03-13: 200 mg via INTRAVENOUS

## 2016-03-13 MED ORDER — LIDOCAINE HCL (PF) 1 % IJ SOLN
INTRAMUSCULAR | Status: DC | PRN
  Administered 2016-03-13: 30 mL

## 2016-03-13 MED ORDER — LABETALOL HCL 5 MG/ML IV SOLN
INTRAVENOUS | Status: AC
  Filled 2016-03-13: qty 4

## 2016-03-13 MED ORDER — ONDANSETRON HCL 4 MG/2ML IJ SOLN
INTRAMUSCULAR | Status: DC | PRN
  Administered 2016-03-13: 4 mg via INTRAVENOUS

## 2016-03-13 MED ORDER — MIDAZOLAM HCL 2 MG/2ML IJ SOLN
INTRAMUSCULAR | Status: AC
  Filled 2016-03-13: qty 2

## 2016-03-13 MED ORDER — HYDROMORPHONE HCL 1 MG/ML IJ SOLN
0.2500 mg | INTRAMUSCULAR | Status: DC | PRN
  Administered 2016-03-13: 0.5 mg via INTRAVENOUS

## 2016-03-13 MED ORDER — HEPARIN SOD (PORK) LOCK FLUSH 100 UNIT/ML IV SOLN
INTRAVENOUS | Status: DC | PRN
  Administered 2016-03-13: 500 [IU] via INTRAVENOUS

## 2016-03-13 MED ORDER — TECHNETIUM TC 99M SULFUR COLLOID FILTERED
1.0000 | Freq: Once | INTRAVENOUS | Status: AC | PRN
  Administered 2016-03-13: 1 via INTRADERMAL

## 2016-03-13 MED ORDER — SODIUM CHLORIDE 0.9 % IV SOLN
INTRAVENOUS | Status: DC | PRN
  Administered 2016-03-13: 14:00:00

## 2016-03-13 MED ORDER — MIDAZOLAM HCL 2 MG/2ML IJ SOLN
2.0000 mg | Freq: Once | INTRAMUSCULAR | Status: AC
  Administered 2016-03-13: 2 mg via INTRAVENOUS

## 2016-03-13 MED ORDER — DEXAMETHASONE SODIUM PHOSPHATE 10 MG/ML IJ SOLN
INTRAMUSCULAR | Status: DC | PRN
  Administered 2016-03-13: 10 mg via INTRAVENOUS

## 2016-03-13 MED ORDER — ACETAMINOPHEN 650 MG RE SUPP
650.0000 mg | RECTAL | Status: DC | PRN
  Filled 2016-03-13: qty 1

## 2016-03-13 MED ORDER — BUPIVACAINE-EPINEPHRINE 0.5% -1:200000 IJ SOLN
INTRAMUSCULAR | Status: DC | PRN
  Administered 2016-03-13: 30 mL

## 2016-03-13 MED ORDER — FENTANYL CITRATE (PF) 100 MCG/2ML IJ SOLN
INTRAMUSCULAR | Status: AC
  Administered 2016-03-13: 75 ug via INTRAVENOUS
  Filled 2016-03-13: qty 2

## 2016-03-13 MED ORDER — SODIUM CHLORIDE 0.9 % IV SOLN: 250.0000 mL | INTRAVENOUS | Status: DC | PRN

## 2016-03-13 MED ORDER — ONDANSETRON HCL 4 MG/2ML IJ SOLN
INTRAMUSCULAR | Status: AC
  Filled 2016-03-13: qty 2

## 2016-03-13 MED ORDER — SUCCINYLCHOLINE CHLORIDE 20 MG/ML IJ SOLN
INTRAMUSCULAR | Status: DC | PRN
  Administered 2016-03-13: 100 mg via INTRAVENOUS

## 2016-03-13 MED ORDER — PROPOFOL 10 MG/ML IV BOLUS
INTRAVENOUS | Status: AC
  Filled 2016-03-13: qty 20

## 2016-03-13 MED ORDER — LACTATED RINGERS IV SOLN
INTRAVENOUS | Status: DC
  Administered 2016-03-13 (×2): via INTRAVENOUS

## 2016-03-13 MED ORDER — LIDOCAINE HCL (PF) 1 % IJ SOLN
INTRAMUSCULAR | Status: AC
  Filled 2016-03-13: qty 30

## 2016-03-13 MED ORDER — BUPIVACAINE-EPINEPHRINE (PF) 0.5% -1:200000 IJ SOLN
INTRAMUSCULAR | Status: AC
  Filled 2016-03-13: qty 30

## 2016-03-13 MED ORDER — OXYCODONE HCL 5 MG PO TABS
5.0000 mg | ORAL_TABLET | ORAL | Status: DC | PRN
  Administered 2016-03-13: 5 mg via ORAL
  Filled 2016-03-13: qty 2

## 2016-03-13 MED ORDER — SODIUM CHLORIDE 0.9% FLUSH: 3.0000 mL | Freq: Two times a day (BID) | INTRAVENOUS | Status: DC

## 2016-03-13 MED ORDER — OXYCODONE HCL 5 MG PO TABS: 5.0000 mg | ORAL_TABLET | Freq: Four times a day (QID) | ORAL | 0 refills | Status: DC | PRN

## 2016-03-13 MED ORDER — FENTANYL CITRATE (PF) 100 MCG/2ML IJ SOLN
INTRAMUSCULAR | Status: AC
  Filled 2016-03-13: qty 4

## 2016-03-13 MED ORDER — LIDOCAINE 2% (20 MG/ML) 5 ML SYRINGE
INTRAMUSCULAR | Status: AC
  Filled 2016-03-13: qty 5

## 2016-03-13 MED ORDER — MIDAZOLAM HCL 2 MG/2ML IJ SOLN
INTRAMUSCULAR | Status: AC
  Administered 2016-03-13: 2 mg via INTRAVENOUS
  Filled 2016-03-13: qty 2

## 2016-03-13 MED ORDER — PHENYLEPHRINE HCL 10 MG/ML IJ SOLN
INTRAMUSCULAR | Status: DC | PRN
  Administered 2016-03-13: 40 ug via INTRAVENOUS
  Administered 2016-03-13: 80 ug via INTRAVENOUS
  Administered 2016-03-13: 40 ug via INTRAVENOUS

## 2016-03-13 MED ORDER — ACETAMINOPHEN 325 MG PO TABS
650.0000 mg | ORAL_TABLET | ORAL | Status: DC | PRN
Start: 2016-03-13 — End: 2016-03-13
  Filled 2016-03-13: qty 2

## 2016-03-13 MED ORDER — CIPROFLOXACIN IN D5W 400 MG/200ML IV SOLN
400.0000 mg | INTRAVENOUS | Status: DC
  Filled 2016-03-13: qty 200

## 2016-03-13 MED ORDER — FENTANYL CITRATE (PF) 100 MCG/2ML IJ SOLN
75.0000 ug | Freq: Once | INTRAMUSCULAR | Status: AC
  Administered 2016-03-13: 75 ug via INTRAVENOUS

## 2016-03-13 MED ORDER — KETOROLAC TROMETHAMINE 30 MG/ML IJ SOLN
INTRAMUSCULAR | Status: AC
  Filled 2016-03-13: qty 1

## 2016-03-13 SURGICAL SUPPLY — 85 items
APPLIER CLIP 9.375 MED OPEN (MISCELLANEOUS) ×3
BAG DECANTER FOR FLEXI CONT (MISCELLANEOUS) ×3 IMPLANT
BINDER BREAST LRG (GAUZE/BANDAGES/DRESSINGS) IMPLANT
BINDER BREAST XLRG (GAUZE/BANDAGES/DRESSINGS) ×3 IMPLANT
BLADE SURG 10 STRL SS (BLADE) IMPLANT
BLADE SURG 11 STRL SS (BLADE) ×3 IMPLANT
BLADE SURG 15 STRL LF DISP TIS (BLADE) IMPLANT
BLADE SURG 15 STRL SS (BLADE)
BNDG COHESIVE 4X5 TAN STRL (GAUZE/BANDAGES/DRESSINGS) ×3 IMPLANT
CANISTER SUCTION 2500CC (MISCELLANEOUS) ×3 IMPLANT
CHLORAPREP W/TINT 10.5 ML (MISCELLANEOUS) IMPLANT
CHLORAPREP W/TINT 26ML (MISCELLANEOUS) ×3 IMPLANT
CLIP APPLIE 9.375 MED OPEN (MISCELLANEOUS) ×2 IMPLANT
CLIP TI LARGE 6 (CLIP) ×3 IMPLANT
CLIP TI MEDIUM 6 (CLIP) ×3 IMPLANT
CLIP TI WIDE RED SMALL 6 (CLIP) ×3 IMPLANT
CONT SPEC 4OZ CLIKSEAL STRL BL (MISCELLANEOUS) ×6 IMPLANT
COVER MAYO STAND STRL (DRAPES) ×3 IMPLANT
COVER PROBE W GEL 5X96 (DRAPES) ×3 IMPLANT
COVER SURGICAL LIGHT HANDLE (MISCELLANEOUS) ×3 IMPLANT
COVER TRANSDUCER ULTRASND GEL (DRAPE) ×3 IMPLANT
CRADLE DONUT ADULT HEAD (MISCELLANEOUS) ×3 IMPLANT
DECANTER SPIKE VIAL GLASS SM (MISCELLANEOUS) ×3 IMPLANT
DERMABOND ADVANCED (GAUZE/BANDAGES/DRESSINGS) ×2
DERMABOND ADVANCED .7 DNX12 (GAUZE/BANDAGES/DRESSINGS) ×4 IMPLANT
DRAPE C-ARM 42X72 X-RAY (DRAPES) ×3 IMPLANT
DRAPE CHEST BREAST 15X10 FENES (DRAPES) ×3 IMPLANT
DRAPE HALF SHEET 40X57 (DRAPES) ×3 IMPLANT
DRAPE UNIVERSAL PACK (DRAPES) ×3 IMPLANT
DRAPE UTILITY XL STRL (DRAPES) ×3 IMPLANT
DRAPE WARM FLUID 44X44 (DRAPE) IMPLANT
ELECT CAUTERY BLADE 6.4 (BLADE) IMPLANT
ELECT COATED BLADE 2.86 ST (ELECTRODE) ×3 IMPLANT
ELECT REM PT RETURN 9FT ADLT (ELECTROSURGICAL) ×3
ELECTRODE REM PT RTRN 9FT ADLT (ELECTROSURGICAL) ×2 IMPLANT
GAUZE SPONGE 4X4 16PLY XRAY LF (GAUZE/BANDAGES/DRESSINGS) ×3 IMPLANT
GEL ULTRASOUND 20GR AQUASONIC (MISCELLANEOUS) IMPLANT
GLOVE BIO SURGEON STRL SZ 6 (GLOVE) ×6 IMPLANT
GLOVE BIO SURGEON STRL SZ7.5 (GLOVE) ×3 IMPLANT
GLOVE BIOGEL PI IND STRL 6.5 (GLOVE) ×4 IMPLANT
GLOVE BIOGEL PI IND STRL 7.0 (GLOVE) ×2 IMPLANT
GLOVE BIOGEL PI IND STRL 7.5 (GLOVE) ×2 IMPLANT
GLOVE BIOGEL PI INDICATOR 6.5 (GLOVE) ×2
GLOVE BIOGEL PI INDICATOR 7.0 (GLOVE) ×1
GLOVE BIOGEL PI INDICATOR 7.5 (GLOVE) ×1
GLOVE ECLIPSE 7.5 STRL STRAW (GLOVE) ×6 IMPLANT
GLOVE SURG SS PI 7.0 STRL IVOR (GLOVE) ×6 IMPLANT
GOWN STRL REUS W/ TWL LRG LVL3 (GOWN DISPOSABLE) ×8 IMPLANT
GOWN STRL REUS W/TWL 2XL LVL3 (GOWN DISPOSABLE) ×6 IMPLANT
GOWN STRL REUS W/TWL LRG LVL3 (GOWN DISPOSABLE) ×4
KIT BASIN OR (CUSTOM PROCEDURE TRAY) ×3 IMPLANT
KIT MARKER MARGIN INK (KITS) ×3 IMPLANT
KIT PORT POWER 8FR ISP CVUE (Catheter) ×3 IMPLANT
KIT ROOM TURNOVER OR (KITS) ×3 IMPLANT
LIGHT WAVEGUIDE WIDE FLAT (MISCELLANEOUS) ×3 IMPLANT
NDL SAFETY ECLIPSE 18X1.5 (NEEDLE) IMPLANT
NEEDLE FILTER BLUNT 18X 1/2SAF (NEEDLE)
NEEDLE FILTER BLUNT 18X1 1/2 (NEEDLE) IMPLANT
NEEDLE HYPO 18GX1.5 SHARP (NEEDLE)
NEEDLE HYPO 25GX1X1/2 BEV (NEEDLE) ×3 IMPLANT
NEEDLE HYPO 25X1 1.5 SAFETY (NEEDLE) ×3 IMPLANT
NS IRRIG 1000ML POUR BTL (IV SOLUTION) ×3 IMPLANT
PACK GENERAL/GYN (CUSTOM PROCEDURE TRAY) ×3 IMPLANT
PACK SURGICAL SETUP 50X90 (CUSTOM PROCEDURE TRAY) IMPLANT
PAD ABD 8X10 STRL (GAUZE/BANDAGES/DRESSINGS) ×3 IMPLANT
PAD ARMBOARD 7.5X6 YLW CONV (MISCELLANEOUS) ×3 IMPLANT
PENCIL BUTTON HOLSTER BLD 10FT (ELECTRODE) IMPLANT
SPONGE LAP 18X18 X RAY DECT (DISPOSABLE) ×3 IMPLANT
STOCKINETTE IMPERVIOUS 9X36 MD (GAUZE/BANDAGES/DRESSINGS) ×3 IMPLANT
SUT MNCRL AB 4-0 PS2 18 (SUTURE) ×6 IMPLANT
SUT MON AB 4-0 PC3 18 (SUTURE) ×3 IMPLANT
SUT PROLENE 2 0 SH DA (SUTURE) ×6 IMPLANT
SUT VIC AB 2-0 SH 27 (SUTURE) ×1
SUT VIC AB 2-0 SH 27XBRD (SUTURE) ×2 IMPLANT
SUT VIC AB 3-0 SH 18 (SUTURE) IMPLANT
SUT VIC AB 3-0 SH 27 (SUTURE) ×1
SUT VIC AB 3-0 SH 27X BRD (SUTURE) ×2 IMPLANT
SYR 20ML ECCENTRIC (SYRINGE) ×6 IMPLANT
SYR 5ML LUER SLIP (SYRINGE) ×3 IMPLANT
SYR BULB 3OZ (MISCELLANEOUS) IMPLANT
SYR CONTROL 10ML LL (SYRINGE) ×6 IMPLANT
TOWEL OR 17X24 6PK STRL BLUE (TOWEL DISPOSABLE) ×3 IMPLANT
TOWEL OR 17X26 10 PK STRL BLUE (TOWEL DISPOSABLE) ×3 IMPLANT
TUBE CONNECTING 12X1/4 (SUCTIONS) IMPLANT
YANKAUER SUCT BULB TIP NO VENT (SUCTIONS) IMPLANT

## 2016-03-13 NOTE — Anesthesia Postprocedure Evaluation (Signed)
Anesthesia Post Note  Patient: Virginia [name]  Procedure(s) Performed: Procedure(s) (LRB): LEFT BREAST LUMPECTOMY WITH RADIOACTIVE SEED X 2 AND SENTINEL LYMPH NODE BIOPSY (Left) INSERTION PORT-A-CATH (Right)  Patient location during evaluation: PACU Anesthesia Type: General Level of consciousness: awake Pain management: pain level controlled Vital Signs Assessment: post-procedure vital signs reviewed and stable Respiratory status: spontaneous breathing Cardiovascular status: stable Anesthetic complications: no       Last Vitals:  Vitals:   03/13/16 1237 03/13/16 1239  BP:  121/75  Pulse: 90 86  Resp: 12 (!) 0  Temp:      Last Pain:  Vitals:   03/13/16 1234  TempSrc:   PainSc: 0-No pain                 Chase Knebel

## 2016-03-13 NOTE — Op Note (Addendum)
Left Breast Radioactive seed localized lumpectomy (2 seeds), sentinel lymph node biopsy, port placement  Indications: This patient presents with history of left breast cancer  Pre-operative Diagnosis: left breast cancer, upper inner quadrant, cT1bN0, ER/PR+, Her2 +  Post-operative Diagnosis: same  Surgeon: Stark Klein   Assistant:  Judyann Munson, RNFA  Anesthesia: General endotracheal anesthesia  ASA Class: 2  Procedure Details  The patient was seen in the Holding Room. The risks, benefits, complications, treatment options, and expected outcomes were discussed with the patient. The possibilities of bleeding, infection, the need for additional procedures, failure to diagnose a condition, and creating a complication requiring transfusion or operation were discussed with the patient. The patient concurred with the proposed plan, giving informed consent.  The site of surgery properly noted/marked. The patient was taken to Operating Room # 9, identified, and the procedure verified as left Breast seed localized Lumpectomy x 2, sentinel lymph node biopsy, port placement. A Time Out was held and the above information confirmed.  The left arm, breast, and bilateral chest were prepped and draped in standard fashion.   Local anesthetic was administered over this area at the angle of the clavicle.  The vein was accessed with 2 passes of the needle. There was good venous return and the wire passed easily with no ectopy.   Fluoroscopy was used to confirm that the wire was in the vena cava.      The patient was placed back level and the area for the pocket was anethetized   with local anesthetic.  A 3-cm transverse incision was made with a #15   blade.  Cautery was used to divide the subcutaneous tissues down to the   pectoralis muscle.  An [employer] retractor was used to elevate the skin   while a pocket was created on top of the pectoralis fascia.  The port   was placed into the pocket to confirm  that it was of adequate size.  The   catheter was preattached to the port.  The port was then secured to the   pectoralis fascia with four 2-0 Prolene sutures.  These were clamped and   not tied down yet.    The catheter was tunneled through to the wire exit site.  The catheter was placed along the wire to determine what length it should be to be in the SVC.  The catheter was cut at 18 cm.  The tunneler sheath and dilator were passed over the wire and the dilator and wire were removed.  The catheter was advanced through the tunneler sheath and the tunneler sheath was pulled away.  Care was taken to keep the catheter in the tunneler sheath as this occurred. This was advanced and the tunneler sheath was removed.  There was good venous   return and easy flush of the catheter.  The Prolene sutures were tied   down to the pectoral fascia.  The skin was reapproximated using 3-0   Vicryl interrupted deep dermal sutures.    Fluoroscopy was used to re-confirm good position of the catheter.  The skin   was then closed using 4-0 Monocryl in a subcuticular fashion.  The port was flushed with concentrated heparin flush as well.  The wounds were then cleaned, dried, and dressed with Dermabond.  The lumpectomy was performed by creating a inferior circumareolar incision over the previously placed radioactive seed.  The seeds were close enough to do a single lumpectomy.  Dissection was carried down to around the point of  maximum signal intensity. The cautery was used to perform the dissection.  Hemostasis was achieved with cautery. The edges of the cavity were marked with large clips, with one each medial, lateral, inferior and superior, and two clips posteriorly.   The specimen was inked with the margin marker paint kit.    Specimen radiography confirmed inclusion of the mammographic lesions, the clips, and the seeds.  The background signal in the breast was zero.  The wound was irrigated and closed with 3-0 vicryl in  layers and 4-0 monocryl subcuticular suture.    Using a hand-held gamma probe, left axillary sentinel nodes were identified transcutaneously.  An oblique incision was created below the axillary hairline.  Dissection was carried through the clavipectoral fascia.  Two deep level 2 axillary sentinel nodes were removed.  Counts per second were 1500 and 200.    The background count was 7 cps.  The wound was irrigated.  Hemostasis was achieved with cautery.  The axillary incision was closed with a 3-0 vicryl deep dermal interrupted sutures and a 4-0 monocryl subcuticular closure.    Sterile dressings were applied. At the end of the operation, all sponge, instrument, and needle counts were correct.  Findings: grossly clear surgical margins and no adenopathy, posterior margin is pectoralis  Estimated Blood Loss:  min         Specimens: left breast lumpectomy and two axillary sentinel lymph nodes.             Complications:  None; patient tolerated the procedure well.         Disposition: PACU - hemodynamically stable.         Condition: stable

## 2016-03-13 NOTE — Anesthesia Procedure Notes (Signed)
Procedure Name: LMA Insertion Date/Time: 03/13/2016 1:51 PM Performed by: Luciana Axe K Pre-anesthesia Checklist: Patient identified, Emergency Drugs available, Suction available and Patient being monitored Patient Re-evaluated:Patient Re-evaluated prior to inductionOxygen Delivery Method: Circle System Utilized Preoxygenation: Pre-oxygenation with 100% oxygen Intubation Type: IV induction Ventilation: Mask ventilation without difficulty LMA: LMA inserted LMA Size: 4.0 Number of attempts: 1 Airway Equipment and Method: Bite block Placement Confirmation: positive ETCO2 Tube secured with: Tape Dental Injury: Teeth and Oropharynx as per pre-operative assessment

## 2016-03-13 NOTE — Anesthesia Procedure Notes (Signed)
Procedure Name: Intubation Date/Time: 03/13/2016 1:54 PM Performed by: Jenne Campus Pre-anesthesia Checklist: Patient identified, Emergency Drugs available, Suction available and Patient being monitored Patient Re-evaluated:Patient Re-evaluated prior to inductionOxygen Delivery Method: Circle System Utilized Preoxygenation: Pre-oxygenation with 100% oxygen Intubation Type: IV induction Ventilation: Mask ventilation without difficulty Laryngoscope Size: Miller and 2 Grade View: Grade I Tube type: Oral Tube size: 7.0 mm Number of attempts: 1 Airway Equipment and Method: Stylet and Oral airway Placement Confirmation: ETT inserted through vocal cords under direct vision,  positive ETCO2 and breath sounds checked- equal and bilateral Secured at: 21 cm Tube secured with: Tape Dental Injury: Teeth and Oropharynx as per pre-operative assessment

## 2016-03-13 NOTE — Anesthesia Preprocedure Evaluation (Addendum)
Anesthesia Evaluation  Patient identified by MRN, date of birth, ID band  Reviewed: Allergy & Precautions, NPO status , Patient's Chart, lab work & pertinent test results  Airway Mallampati: II  TM Distance: >3 FB Neck ROM: Full    Dental  (+) Teeth Intact, Dental Advisory Given   Pulmonary    breath sounds clear to auscultation       Cardiovascular  Rhythm:Regular Rate:Normal     Neuro/Psych Anxiety    GI/Hepatic GERD  Controlled,  Endo/Other  Hypothyroidism   Renal/GU      Musculoskeletal   Abdominal   Peds  Hematology   Anesthesia Other Findings   Reproductive/Obstetrics                             Anesthesia Physical Anesthesia Plan  ASA: II  Anesthesia Plan: General   Post-op Pain Management:    Induction: Intravenous  Airway Management Planned: Oral ETT  Additional Equipment:   Intra-op Plan:   Post-operative Plan: Extubation in OR  Informed Consent: I have reviewed the patients History and Physical, chart, labs and discussed the procedure including the risks, benefits and alternatives for the proposed anesthesia with the patient or authorized representative who has indicated his/her understanding and acceptance.   Dental advisory given  Plan Discussed with: CRNA and Anesthesiologist  Anesthesia Plan Comments:         Anesthesia Quick Evaluation

## 2016-03-13 NOTE — Transfer of Care (Signed)
Immediate Anesthesia Transfer of Care Note  Patient: Virginia [name]  Procedure(s) Performed: Procedure(s): LEFT BREAST LUMPECTOMY WITH RADIOACTIVE SEED X 2 AND SENTINEL LYMPH NODE BIOPSY (Left) INSERTION PORT-A-CATH (Right)  Patient Location: PACU  Anesthesia Type:General  Level of Consciousness: awake, oriented and patient cooperative  Airway & Oxygen Therapy: Patient Spontanous Breathing and Patient connected to nasal cannula oxygen  Post-op Assessment: Report given to RN, Post -op Vital signs reviewed and stable and Patient moving all extremities  Post vital signs: Reviewed and stable  Last Vitals:  Vitals:   03/13/16 1237 03/13/16 1239  BP:  121/75  Pulse: 90 86  Resp: 12 (!) 0  Temp:      Last Pain:  Vitals:   03/13/16 1234  TempSrc:   PainSc: 0-No pain         Complications: No apparent anesthesia complications

## 2016-03-13 NOTE — Interval H&P Note (Signed)
History and Physical Interval Note:  03/13/2016 11:14 AM  Virginia [name]  has presented today for surgery, with the diagnosis of LEFT BREAST CANCER  The various methods of treatment have been discussed with the patient and family. After consideration of risks, benefits and other options for treatment, the patient has consented to  Procedure(s): LEFT BREAST LUMPECTOMY WITH RADIOACTIVE SEED X 2 AND SENTINEL LYMPH NODE BIOPSY (Left) INSERTION PORT-A-CATH WITH [place] (N/A) as a surgical intervention .  The patient's history has been reviewed, patient examined, no change in status, stable for surgery.  I have reviewed the patient's chart and labs.  Questions were answered to the patient's satisfaction.     Virginia [name]

## 2016-03-13 NOTE — Discharge Instructions (Addendum)
[facility] °Office Phone Number [phone] ° °BREAST BIOPSY/ PARTIAL MASTECTOMY: POST OP INSTRUCTIONS ° °Always review your discharge instruction sheet given to you by the facility where your surgery was performed. ° °IF YOU HAVE DISABILITY OR FAMILY LEAVE FORMS, YOU MUST BRING THEM TO THE OFFICE FOR PROCESSING.  DO NOT GIVE THEM TO YOUR DOCTOR. ° °1. A prescription for pain medication may be given to you upon discharge.  Take your pain medication as prescribed, if needed.  If narcotic pain medicine is not needed, then you may take acetaminophen (Tylenol) or ibuprofen (Advil) as needed. °2. Take your usually prescribed medications unless otherwise directed °3. If you need a refill on your pain medication, please contact your pharmacy.  They will contact our office to request authorization.  Prescriptions will not be filled after 5pm or on week-ends. °4. You should eat very light the first 24 hours after surgery, such as soup, crackers, pudding, etc.  Resume your normal diet the day after surgery. °5. Most patients will experience some swelling and bruising in the breast.  Ice packs and a good support bra will help.  Swelling and bruising can take several days to resolve.  °6. It is common to experience some constipation if taking pain medication after surgery.  Increasing fluid intake and taking a stool softener will usually help or prevent this problem from occurring.  A mild laxative (Milk of Magnesia or Miralax) should be taken according to package directions if there are no bowel movements after 48 hours. °7. Unless discharge instructions indicate otherwise, you may remove your bandages 48 hours after surgery, and you may shower at that time.  You may have steri-strips (small skin tapes) in place directly over the incision.  These strips should be left on the skin for 7-10 days.   Any sutures or staples will be removed at the office during your follow-up visit. °8. ACTIVITIES:  You may resume  regular daily activities (gradually increasing) beginning the next day.  Wearing a good support bra or sports bra (or the breast binder) minimizes pain and swelling.  You may have sexual intercourse when it is comfortable. °a. You may drive when you no longer are taking prescription pain medication, you can comfortably wear a seatbelt, and you can safely maneuver your car and apply brakes. °b. RETURN TO WORK:  __________1 week_______________ °9. You should see your doctor in the office for a follow-up appointment approximately two weeks after your surgery.  Your doctor’s nurse will typically make your follow-up appointment when she calls you with your pathology report.  Expect your pathology report 2-3 business days after your surgery.  You may call to check if you do not hear from us after three days. ° ° °WHEN TO CALL YOUR DOCTOR: °1. Fever over 101.0 °2. Nausea and/or vomiting. °3. Extreme swelling or bruising. °4. Continued bleeding from incision. °5. Increased pain, redness, or drainage from the incision. ° °The clinic staff is available to answer your questions during regular business hours.  Please don’t hesitate to call and ask to speak to one of the nurses for clinical concerns.  If you have a medical emergency, go to the nearest emergency room or call 911.  A surgeon from [facility] is always on call at the hospital. ° °For further questions, please visit [email or web address]  ° °

## 2016-03-14 ENCOUNTER — Encounter (HOSPITAL_COMMUNITY): Payer: Self-pay | Admitting: General Surgery

## 2016-03-17 NOTE — Progress Notes (Signed)
Please let patient know margins are negative.  There were 3 small areas of cancer consistent with the imaging. Lymph nodes negative for cancer.

## 2016-03-18 ENCOUNTER — Telehealth: Payer: Self-pay | Admitting: *Deleted

## 2016-03-18 NOTE — Telephone Encounter (Signed)
Clarified pt questions regarding final pathology report. Confirmed post-op appt with Dr. Jana Hakim on 03/24/16. Denies further needs or questions at this time.

## 2016-03-24 ENCOUNTER — Ambulatory Visit (HOSPITAL_BASED_OUTPATIENT_CLINIC_OR_DEPARTMENT_OTHER): Payer: PRIVATE HEALTH INSURANCE | Admitting: Oncology

## 2016-03-24 VITALS — BP 132/73 | HR 67 | Temp 98.2°F | Resp 18 | Ht 67.0 in | Wt 187.2 lb

## 2016-03-24 DIAGNOSIS — Z170 Estrogen receptor positive status [ER+]: Secondary | ICD-10-CM | POA: Diagnosis not present

## 2016-03-24 DIAGNOSIS — C50212 Malignant neoplasm of upper-inner quadrant of left female breast: Secondary | ICD-10-CM | POA: Diagnosis not present

## 2016-03-24 MED ORDER — ONDANSETRON HCL 8 MG PO TABS: 8.0000 mg | ORAL_TABLET | Freq: Two times a day (BID) | ORAL | 1 refills | Status: DC | PRN

## 2016-03-24 MED ORDER — LIDOCAINE-PRILOCAINE 2.5-2.5 % EX CREA: TOPICAL_CREAM | CUTANEOUS | 3 refills | Status: DC

## 2016-03-24 MED ORDER — PROCHLORPERAZINE MALEATE 10 MG PO TABS: 10.0000 mg | ORAL_TABLET | Freq: Four times a day (QID) | ORAL | 1 refills | Status: DC | PRN

## 2016-03-24 NOTE — Progress Notes (Signed)
START ON PATHWAY REGIMEN - Breast  [id number]: Weekly Paclitaxel + Trastuzumab x 12 Doses Followed by Trastuzumab Maintenance q21 Days x 13 Cycles  Paclitaxel Weekly + Trastuzumab Weekly:   Administer weekly:     Paclitaxel (Taxol(R)) 80 mg/m2 in 250 mL NS IV over 1 hour (in glass or non-PVC bag). Doses repeated q7days. 12 total doses. Dose Mod: None     Trastuzumab (Herceptin(R)) 4 mg/kg in 250 mL NS IV over 90 min as a loading dose first dose only. Dose Mod: None     Trastuzumab (Herceptin(R)) 2 mg/kg in 250 mL NS IV over 30 min weekly for total of 12 weeks. Repeated q7 days. 12 total doses of trastuzumab including cycle 1 loading dose. Dose Mod: None Additional Orders: At completion of 12 weeks of paclitaxel/trastuzumab therapy, proceed to q3week trastuzumab maintenance therapy x 13 doses. Recommend LVEF monitoring at baseline, then q3-4 months.  **Always confirm dose/schedule in your pharmacy ordering system**    Trastuzumab (Maintenance - NO Loading Dose):   A cycle is every 21 days:     Trastuzumab (Herceptin(R)) 6 mg/kg in 250 mL NS IV over 90 minutes.  If tolerated, maintenance dose may be given over 30 mins. Recommended Monitoring: LVEF q3-4 months for adjuvant treatment, or with the development of cardiac symptoms and regimen  changes for metastatic treatment. Dose Mod: None  **Always confirm dose/schedule in your pharmacy ordering system**    Patient Characteristics: Adjuvant Therapy, Node Negative, HER2/neu Positive, ER Positive, Stage Ia and Ib, T1c AJCC Stage Grouping: IA Current Disease Status: No Distant Mets or Local Recurrence AJCC M Stage: 0 ER Status: Positive (+) AJCC N Stage: 0 AJCC T Stage: 1c HER2/neu: Positive (+) PR Status: Positive (+) Node Status: Negative (-)  Intent of Therapy: Curative Intent, Discussed with Patient

## 2016-03-24 NOTE — Progress Notes (Signed)
Virginia [name]  Telephone:([phone] Fax:([phone]) [phone]     ID: AZALEAH USMAN DOB: 1980/07/19  MR#: [id number]  YQM#:[id number]  Patient Care Team: [name], MD as PCP - General (Family Medicine) Stark Klein, MD as Consulting Physician (General Surgery) Chauncey Cruel, MD as Consulting Physician (Oncology) Eppie Gibson, MD as Attending Physician (Radiation Oncology) Chauncey Cruel, MD OTHER MD:  CHIEF COMPLAINT:  Estrogen receptor positive breast cancer  CURRENT TREATMENT:  Adjuvant chemotherapy and anti-HER-2 immunotherapy  BREAST CANCER HISTORY: From the original intake note:  Virginia [name] noted some itching on her breasts and since this was the symptom that preceded her grandmother's breast cancer she underwent baseline bilateral screening mammography at [facility] 01/10/2016. The breast density was category B. In the left breast central to the nipple there was a 1.7 cm irregular high density mass with pleomorphic calcifications. Accordingly the patient was brought back 01/14/2016 for left diagnostic mammography and ultrasonography. This confirmed a 0.8 cm irregular mass in the left breast at the 10:00 position. 0.5 cm from the nipple lateral and posterior to that there was a 0.2 cm cluster of amorphous calcifications with an associated density. Anterior to the index mass was a 0.5 cm cluster of amorphous calcifications. All 3 findings taken together 3 cm. Ultrasound of the left breast found a 1 cm irregular mass in the left breast at the 10:00 position retroareolarly. There were no other sonographic abnormalities and the left axilla was sonographically benign  On 01/15/2016 the patient underwent core needle biopsy of the left breast index lesion at 10:00, and this found invasive ductal carcinoma, E-cadherin positive, estrogen receptor 90% positive, progesterone receptor 40% positive, both with moderate staining intensity, with an MIB-1 of 5%, but HER-2 amplified, with  a signals ratio of 2.41 and the number per cell 6.50. One of the 2 areas of abnormal calcifications, the more posterior one measuring 0.2 cm, was also biopsied and showed only atypical ductal hyperplasia (S [id number]).  Her subsequent history is as detailed below  INTERVAL HISTORY: Virginia [name] returns today for follow-up of her triple positive breast cancer accompanied by her husband.. Since her last visit here she had genetics testing, which showed no deleterious mutation. With that information she decided for breast conserving surgery with sentinel lymph node sampling and this was performed 03/13/2016. The final pathology ([id number]) showed multifocal invasive ductal carcinoma, grade 2, largest lesion measuring 1.3 cm, the additional 2 lesions measuring 0.8 and 0.5 cm. Margins were clear. Both sentinel lymph nodes were clear as well.  She had a port placed at the same time in the right anterior upper chest. She has no problems from that so far at this point--it felt a little "tight" initially  She has been on tamoxifen since her diagnosis and she has tolerated it well. She had some hot flashes initially but there have subsided severe now only occasional  REVIEW OF SYSTEMS: She used the oxycodone for a couple of days but it made her feel very queasy so she stopped after that and has been using some Advil and Tylenol for pain control. She did not have problems with fever or bleeding. She does have some discomfort in the left axilla and medial aspect of the left upper arm. Otherwise a detailed review of systems today was stable  PAST MEDICAL HISTORY: Past Medical History:  Diagnosis Date  . Anemia    as a child  . Anxiety   . Cholestasis   . Elevated  liver enzymes    during pregnancy March 2015  . Family history of breast cancer   . Family history of prostate cancer   . GERD (gastroesophageal reflux disease)    one episode  . Headache   . Hypothyroidism   . Malignant neoplasm of  upper-inner quadrant of left female breast ([place]) 01/16/2016    PAST SURGICAL HISTORY: Past Surgical History:  Procedure Laterality Date  . BREAST LUMPECTOMY WITH RADIOACTIVE SEED AND SENTINEL LYMPH NODE BIOPSY Left 03/13/2016   Procedure: LEFT BREAST LUMPECTOMY WITH RADIOACTIVE SEED X 2 AND SENTINEL LYMPH NODE BIOPSY;  Surgeon: Stark Klein, MD;  Location: [facility];  Service: General;  Laterality: Left;  . NO PAST SURGERIES    . PORTACATH PLACEMENT Right 03/13/2016   Procedure: INSERTION PORT-A-CATH;  Surgeon: Stark Klein, MD;  Location: [facility];  Service: General;  Laterality: Right;    FAMILY HISTORY Family History  Problem Relation Age of Onset  . Hypertension Mother   . Hypertension Father   . Prostate cancer Father   . Liver cancer Maternal Grandmother   . Breast cancer Paternal Grandmother     dx in her 34s  . Lung cancer Paternal Aunt     smoker  . Heart attack Paternal Grandfather   . Liver cancer Other   The patient's parents are alive, in their mid 64s as of November 2017. The patient father was diagnosed with prostate cancer at age 27. A paternal aunt was diagnosed with lung cancer in her late 73s. Also the paternal grandmother was diagnosed with breast cancer at the age of 64. On the mother's side there are 2 cases of liver cancer, age of diagnosis unknown.  GYNECOLOGIC HISTORY:  No LMP recorded.  Menarche age 49, first live birth age 46, she is [place] P2. She still having periods, most recently 01/21/2016, but they are no longer regular. She remotely used a number ring and oral contraceptives but stopped in 2016.Currently there use barrier methods for contraception.   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 Her husband [name] works as a Printmaker for [employer] (for example he worked on our (). Their children are [name] and [name],, aged 74 and 2 as of November 2017. Attention and is not a church attender      ADVANCED DIRECTIVES: Not in place   HEALTH MAINTENANCE: Social History  Substance Use Topics  . Smoking status: Never Smoker  . Smokeless tobacco: Never Used  . Alcohol use Yes     Comment: occasional     Colonoscopy:  PAP:2016  Bone density:   Allergies  Allergen Reactions  . Amoxicillin Diarrhea and Nausea Only    Has patient had a PCN reaction causing immediate rash, facial/tongue/throat swelling, SOB or lightheadedness with hypotension: No Has patient had a PCN reaction causing severe rash involving mucus membranes or skin necrosis: No Has patient had a PCN reaction that required hospitalization No Has patient had a PCN reaction occurring within the last 10 years: Yes If all of the above answers are "NO", then may proceed with Cephalosporin use.     Current Outpatient Prescriptions  Medication Sig Dispense Refill  . acetaminophen (TYLENOL) 500 MG tablet Take 500-1,000 mg by mouth every 6 (six) hours as needed (for headaches.).    Marland Kitchen fluticasone (FLONASE) 50 MCG/ACT nasal spray Place 1-2 sprays into both nostrils daily as needed for allergies or rhinitis.    Marland Kitchen ibuprofen (ADVIL,MOTRIN) 200  MG tablet Take 400 mg by mouth every 8 (eight) hours as needed (for pain.).    Marland Kitchen Multiple Vitamins-Minerals (MULTIVITAMIN GUMMIES ADULT PO) Take 2 tablets by mouth daily.    Marland Kitchen oxyCODONE (OXY IR/ROXICODONE) 5 MG immediate release tablet Take 1-2 tablets (5-10 mg total) by mouth every 6 (six) hours as needed for moderate pain, severe pain or breakthrough pain. 20 tablet 0  . tamoxifen (NOLVADEX) 20 MG tablet Take 20 mg by mouth daily at 2 am.     No current facility-administered medications for this visit.     OBJECTIVE: Young-appearing woman In no acute distress Vitals:   03/24/16 1435  BP: 132/73  Pulse: 67  Resp: 18  Temp: 98.2 F (36.8 C)     Body mass index is 29.32 kg/m.    ECOG FS:1 - Symptomatic but completely ambulatory  Sclerae unicteric, EOMs intact Oropharynx  clear and moist No cervical or supraclavicular adenopathy Lungs no rales or rhonchi Heart regular rate and rhythm Abd soft, nontender, positive bowel sounds MSK no focal spinal tenderness, no upper extremity lymphedema Neuro: nonfocal, well oriented, appropriate affect Breasts: The right breast is benign. The left breast is status post lumpectomy. The cosmetic result is excellent. The incisions are healing nicely without dehiscence swelling or erythema. The left axilla is benign.   LAB RESULTS:  CMP     Component Value Date/Time   NA 139 03/07/2016 1612   NA 141 01/23/2016 0836   K 4.5 03/07/2016 1612   K 3.8 01/23/2016 0836   CL 107 03/07/2016 1612   CO2 26 03/07/2016 1612   CO2 25 01/23/2016 0836   GLUCOSE 102 (H) 03/07/2016 1612   GLUCOSE 95 01/23/2016 0836   BUN 7 03/07/2016 1612   BUN 7.3 01/23/2016 0836   CREATININE 0.87 03/07/2016 1612   CREATININE 0.8 01/23/2016 0836   CALCIUM 9.5 03/07/2016 1612   CALCIUM 9.4 01/23/2016 0836   PROT 7.1 03/07/2016 1612   PROT 7.5 01/23/2016 0836   ALBUMIN 4.0 03/07/2016 1612   ALBUMIN 3.9 01/23/2016 0836   AST 24 03/07/2016 1612   AST 18 01/23/2016 0836   ALT 30 03/07/2016 1612   ALT 27 01/23/2016 0836   ALKPHOS 44 03/07/2016 1612   ALKPHOS 47 01/23/2016 0836   BILITOT 0.3 03/07/2016 1612   BILITOT 0.34 01/23/2016 0836   GFRNONAA >60 03/07/2016 1612   GFRAA >60 03/07/2016 1612    INo results found for: SPEP, UPEP  Lab Results  Component Value Date   WBC 5.8 03/07/2016   NEUTROABS 1.9 01/23/2016   HGB 12.4 03/07/2016   HCT 39.1 03/07/2016   MCV 84.3 03/07/2016   PLT 202 03/07/2016      Chemistry      Component Value Date/Time   NA 139 03/07/2016 1612   NA 141 01/23/2016 0836   K 4.5 03/07/2016 1612   K 3.8 01/23/2016 0836   CL 107 03/07/2016 1612   CO2 26 03/07/2016 1612   CO2 25 01/23/2016 0836   BUN 7 03/07/2016 1612   BUN 7.3 01/23/2016 0836   CREATININE 0.87 03/07/2016 1612   CREATININE 0.8 01/23/2016  0836      Component Value Date/Time   CALCIUM 9.5 03/07/2016 1612   CALCIUM 9.4 01/23/2016 0836   ALKPHOS 44 03/07/2016 1612   ALKPHOS 47 01/23/2016 0836   AST 24 03/07/2016 1612   AST 18 01/23/2016 0836   ALT 30 03/07/2016 1612   ALT 27 01/23/2016 0836   BILITOT 0.3  03/07/2016 1612   BILITOT 0.34 01/23/2016 0836       No results found for: LABCA2  No components found for: [id number]  No results for input(s): INR in the last 168 hours.  Urinalysis    Component Value Date/Time   COLORURINE YELLOW 10/14/2012 1954   APPEARANCEUR CLEAR 10/14/2012 1954   LABSPEC >1.030 (H) 10/14/2012 1954   PHURINE 6.0 10/14/2012 1954   GLUCOSEU NEGATIVE 10/14/2012 1954   HGBUR TRACE (A) 10/14/2012 1954   BILIRUBINUR NEGATIVE 10/14/2012 1954   KETONESUR 15 (A) 10/14/2012 1954   PROTEINUR NEGATIVE 10/14/2012 1954   UROBILINOGEN 0.2 10/14/2012 1954   NITRITE NEGATIVE 10/14/2012 1954   LEUKOCYTESUR NEGATIVE 10/14/2012 1954     STUDIES: Dg Fluoro Guide Cv Line-no Report  Result Date: 03/13/2016 There is no Radiologist interpretation  for this exam.    ELIGIBLE FOR AVAILABLE RESEARCH PROTOCOL: no  ASSESSMENT: 36 y.o. [place] woman status post left breast upper inner quadrant biopsy 01/15/2016 for a clinical T1c N0, stage IA invasive ductal carcinoma, E-cadherin positive, estrogen receptor 90% positive, progesterone receptor 40% positive, both with moderate staining intensity, with an MIB-1 of 5%, but HER-2 amplified, with a signals ratio of 2.41 and the number per cell 6.50.  (a) One of 2 areas of abnormal calcifications, the more posterior one measuring 0.2 cm, was also biopsied and showed only atypical ductal hyperplasia  (b) a 4.1 cm area of non-masslike enhancement in the left breast is scheduled for biopsy 02/19/2016  (1) tamoxifen started 01/23/2016 neoadjuvantly pending a definitive surgical decision  (2) genetics testing 02/22/2016 through the Breast/Ovarian gene panel offered  by [facility] found no deleterious mutations in  ATM, BARD1, BRCA1, BRCA2, BRIP1, CDH1, CHEK2, EPCAM, FANCC, MLH1, MSH2, MSH6, NBN, PALB2, PMS2, PTEN, RAD51C, RAD51D, TP53, and XRCC2.     (3) status post left lumpectomy and sentinel lymph node sampling 03/13/2016 for an mpT1c pN0, stage IA invasive ductal carcinoma, grade 2, with negative margins.  (4) paclitaxel weekly 12 and trastuzumab every 21 days to follow surgery  (5) trastuzumab to be continued to complete a year  (a) echocardiogram 02/11/2016 shows ejection fraction in the 60-65% range.  (6) adjuvant radiation to follow chemotherapy  (7) anti-estrogens to resume at the completion of local treatment, to be continued for 5-10 years perfect slightly  PLAN: I spent approximately 30 minutes with Erline Levine with most of that time spent discussing her complex problems.  We started by going over her genetics, which fortunately did not show a deleterious mutation. We then discussed her pathology report and of course there are many alarming words they are like "lymphovascular invasion" and we clarified all that. Basically she had 3 small tumors and we don't at them together. We used the larger one which was just over half an inch. We emphasized the fact that her lymph nodes were clear. This was a stage I tumor and the margins were negative.  By receiving chemotherapy she will reduce her risk of recurrence by one third. We have good evidence that Taxol alone is very effective in this setting and that is what she will receive. She understands the possible toxicities, side effects and complications of this agent and I particularly emphasized that she does need to tell us if she develops any numbness or tingling in the pads of her fingers or toes.  She may or may not lose her hair. She has already chosen awake but she is going to wait and see if she does lose her hair  for pain 4.  We discussed antiemetics and I have written for both Compazine and Zofran for  her. She will give each one of these a try and then choose the one that she is most comfortable with. She only needs to take one of them the night of and the morning after chemotherapy.  We also discussed Herceptin. She has a good understanding of the possible toxicities, side effects and complications of this agent and she already had an echocardiogram in December. Her next one will be in March.  She would like to start February 15 and I have entered those orders. I have also put her supportive medicine order pharmacy.  She will see my 74 assistant of 04/10/2016 just to make sure everything is in order before her first cycle, and then she will see me a week later to evaluate side effects  She knows to call for any problems that may develop before her next visit.  Chauncey Cruel, MD   03/24/2016 2:52 PM Medical Oncology and Hematology [facility] [address] [place], [place] [address] Tel. [phone]    Fax. [phone]

## 2016-03-28 ENCOUNTER — Telehealth: Payer: Self-pay | Admitting: Oncology

## 2016-03-28 NOTE — Telephone Encounter (Signed)
sw pt to confirm 2/15 appt date/time per LOS

## 2016-04-04 ENCOUNTER — Other Ambulatory Visit: Payer: Self-pay

## 2016-04-09 ENCOUNTER — Other Ambulatory Visit: Payer: Self-pay | Admitting: *Deleted

## 2016-04-09 ENCOUNTER — Other Ambulatory Visit: Payer: Self-pay | Admitting: Adult Health

## 2016-04-09 DIAGNOSIS — C50212 Malignant neoplasm of upper-inner quadrant of left female breast: Secondary | ICD-10-CM

## 2016-04-09 DIAGNOSIS — Z170 Estrogen receptor positive status [ER+]: Secondary | ICD-10-CM

## 2016-04-09 NOTE — Progress Notes (Signed)
[place]  [phone] [phone]     ID: Virginia [name] DOB: Aug 15, 1980  MR#: [id number]  IAX#:[id number]  Patient Care Team: [name], MD as PCP - General (Family Medicine) Stark Klein, MD as Consulting Physician (General Surgery) Chauncey Cruel, MD as Consulting Physician (Oncology) Eppie Gibson, MD as Attending Physician (Radiation Oncology) Minette Headland, NP as Nurse Practitioner (Hematology and Oncology) Charlestine Massed, NP OTHER MD:  CHIEF COMPLAINT:  Estrogen receptor positive breast cancer  CURRENT TREATMENT:  Adjuvant chemotherapy and anti-HER-2 immunotherapy  BREAST CANCER HISTORY: From the original intake note:  Virginia [name] noted some itching on her breasts and since this was the symptom that preceded her grandmother's breast cancer she underwent baseline bilateral screening mammography at [facility] 01/10/2016. The breast density was category B. In the left breast central to the nipple there was a 1.7 cm irregular high density mass with pleomorphic calcifications. Accordingly the patient was brought back 01/14/2016 for left diagnostic mammography and ultrasonography. This confirmed a 0.8 cm irregular mass in the left breast at the 10:00 position. 0.5 cm from the nipple lateral and posterior to that there was a 0.2 cm cluster of amorphous calcifications with an associated density. Anterior to the index mass was a 0.5 cm cluster of amorphous calcifications. All 3 findings taken together 3 cm. Ultrasound of the left breast found a 1 cm irregular mass in the left breast at the 10:00 position retroareolarly. There were no other sonographic abnormalities and the left axilla was sonographically benign  On 01/15/2016 the patient underwent core needle biopsy of the left breast index lesion at 10:00, and this found invasive ductal carcinoma, E-cadherin positive, estrogen receptor 90% positive, progesterone receptor 40% positive, both with  moderate staining intensity, with an MIB-1 of 5%, but HER-2 amplified, with a signals ratio of 2.41 and the number per cell 6.50. One of the 2 areas of abnormal calcifications, the more posterior one measuring 0.2 cm, was also biopsied and showed only atypical ductal hyperplasia ([id number]).  Her subsequent history is as detailed below  INTERVAL HISTORY: Virginia [name] is here today for evaluation prior to receiving her first cycle of adjuvant treatment with weekly Paclitaxel and every three week Trastuzumab.  She is doing well today.  She has picked up her anti-emetic prescriptions.  She has undergone her echocardiogram.  She is having some difficulty with falling asleep at night time.  She wants to know what over the counter she could try.    REVIEW OF SYSTEMS: Chariti denies fevers, chills, headaches, fatigue, new pain, swollen lymph nodes, sore throat, chest pain, palpitations, shortness of breath, cough, nausea, vomiting, diarrhea, abdominal pain, constipation, skin changes, lower extremity swelling, or any other concerns today.    PAST MEDICAL HISTORY: Past Medical History:  Diagnosis Date  . Anemia    as a child  . Anxiety   . Cholestasis   . Elevated liver enzymes    during pregnancy March 2015  . Family history of breast cancer   . Family history of prostate cancer   . GERD (gastroesophageal reflux disease)    one episode  . Headache   . Hypothyroidism   . Malignant neoplasm of upper-inner quadrant of left female breast ([place]) 01/16/2016    PAST SURGICAL HISTORY: Past Surgical History:  Procedure Laterality Date  . BREAST LUMPECTOMY WITH RADIOACTIVE SEED AND SENTINEL LYMPH NODE BIOPSY Left 03/13/2016   Procedure: LEFT BREAST LUMPECTOMY WITH RADIOACTIVE SEED X 2 AND  SENTINEL LYMPH NODE BIOPSY;  Surgeon: Stark Klein, MD;  Location: [facility];  Service: General;  Laterality: Left;  . NO PAST SURGERIES    . PORTACATH PLACEMENT Right 03/13/2016   Procedure: INSERTION  PORT-A-CATH;  Surgeon: Stark Klein, MD;  Location: [facility];  Service: General;  Laterality: Right;    FAMILY HISTORY Family History  Problem Relation Age of Onset  . Hypertension Mother   . Hypertension Father   . Prostate cancer Father   . Liver cancer Maternal Grandmother   . Breast cancer Paternal Grandmother     dx in her 70s  . Lung cancer Paternal Aunt     smoker  . Heart attack Paternal Grandfather   . Liver cancer Other   The patient's parents are alive, in their mid 20s as of November 2017. The patient father was diagnosed with prostate cancer at age 61. A paternal aunt was diagnosed with lung cancer in her late 43s. Also the paternal grandmother was diagnosed with breast cancer at the age of 62. On the mother's side there are 2 cases of liver cancer, age of diagnosis unknown.  GYNECOLOGIC HISTORY:  Patient's last menstrual period was 03/15/2016.  Menarche age 37, first live birth age 58, she is [place] P2. She still having periods, most recently 01/21/2016, but they are no longer regular. She remotely used a number ring and oral contraceptives but stopped in 2016.Currently there use barrier methods for contraception.   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 Her husband [name] works as a Printmaker for [employer] (for example he worked on our (). Their children are [name] and [name],, aged 76 and 2 as of November 2017. Attention and is not a church attender    ADVANCED DIRECTIVES: Not in place   HEALTH MAINTENANCE: Social History  Substance Use Topics  . Smoking status: Never Smoker  . Smokeless tobacco: Never Used  . Alcohol use Yes     Comment: occasional     Colonoscopy:  PAP:2016  Bone density:   Allergies  Allergen Reactions  . Amoxicillin Diarrhea and Nausea Only    Has patient had a PCN reaction causing immediate rash, facial/tongue/throat swelling, SOB or lightheadedness with hypotension:  No Has patient had a PCN reaction causing severe rash involving mucus membranes or skin necrosis: No Has patient had a PCN reaction that required hospitalization No Has patient had a PCN reaction occurring within the last 10 years: Yes If all of the above answers are "NO", then may proceed with Cephalosporin use.     Current Outpatient Prescriptions  Medication Sig Dispense Refill  . acetaminophen (TYLENOL) 500 MG tablet Take 500-1,000 mg by mouth every 6 (six) hours as needed (for headaches.).    Marland Kitchen fluticasone (FLONASE) 50 MCG/ACT nasal spray Place 1-2 sprays into both nostrils daily as needed for allergies or rhinitis.    Marland Kitchen ibuprofen (ADVIL,MOTRIN) 200 MG tablet Take 400 mg by mouth every 8 (eight) hours as needed (for pain.).    Marland Kitchen lidocaine-prilocaine (EMLA) cream Apply to affected area once 30 g 3  . Multiple Vitamins-Minerals (MULTIVITAMIN GUMMIES ADULT PO) Take 2 tablets by mouth daily.    . ondansetron (ZOFRAN) 8 MG tablet Take 1 tablet (8 mg total) by mouth 2 (two) times daily as needed (Nausea or vomiting). 30 tablet 1  . prochlorperazine (COMPAZINE) 10 MG tablet Take 1 tablet (10 mg total) by mouth every 6 (six) hours as needed (  Nausea or vomiting). 30 tablet 1   No current facility-administered medications for this visit.    Facility-Administered Medications Ordered in Other Visits  Medication Dose Route Frequency Provider Last Rate Last Dose  . 0.9 %  sodium chloride infusion   Intravenous Once Chauncey Cruel, MD      . dexamethasone (DECADRON) 20 mg in sodium chloride 0.9 % 50 mL IVPB  20 mg Intravenous Once Chauncey Cruel, MD      . famotidine (PEPCID) IVPB 20 mg premix  20 mg Intravenous Once Chauncey Cruel, MD      . heparin lock flush 100 unit/mL  500 Units Intracatheter Once PRN Chauncey Cruel, MD      . PACLitaxel (TAXOL) 162 mg in dextrose 5 % 250 mL chemo infusion (</= 5m/m2)  80 mg/m2 (Treatment Plan Recorded) Intravenous Once GChauncey Cruel MD        . sodium chloride flush (NS) 0.9 % injection 10 mL  10 mL Intracatheter PRN GChauncey Cruel MD      . trastuzumab (HERCEPTIN) 672 mg in sodium chloride 0.9 % 250 mL chemo infusion  8 mg/kg (Treatment Plan Recorded) Intravenous Once GChauncey Cruel MD        OBJECTIVE:Shea Stakeswoman In no acute distress Vitals:   04/10/16 1000  BP: 133/63  Pulse: 83  Resp: 18  Temp: 98.1 F (36.7 C)     Body mass index is 29.48 kg/m.    ECOG FS:1 - Symptomatic but completely ambulatory GENERAL: Patient is a well appearing female in no acute distress HEENT:  Sclerae anicteric.  Oropharynx clear and moist. No ulcerations or evidence of oropharyngeal candidiasis. Neck is supple.  NODES:  No cervical, supraclavicular, or axillary lymphadenopathy palpated.  BREAST EXAM:  Deferred. LUNGS:  Clear to auscultation bilaterally.  No wheezes or rhonchi. HEART:  Regular rate and rhythm. No murmur appreciated. ABDOMEN:  Soft, nontender.  Positive, normoactive bowel sounds. No organomegaly palpated. MSK:  No focal spinal tenderness to palpation. Full range of motion bilaterally in the upper extremities. EXTREMITIES:  No peripheral edema.   SKIN:  Clear with no obvious rashes or skin changes. No nail dyscrasia. NEURO:  Nonfocal. Well oriented.  Appropriate affect.  LAB RESULTS:  CMP     Component Value Date/Time   NA 141 04/10/2016 0940   K 4.2 04/10/2016 0940   CL 107 03/07/2016 1612   CO2 24 04/10/2016 0940   GLUCOSE 83 04/10/2016 0940   BUN 11.5 04/10/2016 0940   CREATININE 0.8 04/10/2016 0940   CALCIUM 9.5 04/10/2016 0940   PROT 7.3 04/10/2016 0940   ALBUMIN 4.0 04/10/2016 0940   AST 33 04/10/2016 0940   ALT 63 (H) 04/10/2016 0940   ALKPHOS 61 04/10/2016 0940   BILITOT 0.23 04/10/2016 0940   GFRNONAA >60 03/07/2016 1612   GFRAA >60 03/07/2016 1612    INo results found for: SPEP, UPEP  Lab Results  Component Value Date   WBC 4.7 04/10/2016   NEUTROABS 2.5 04/10/2016   HGB 12.2  04/10/2016   HCT 37.8 04/10/2016   MCV 84.6 04/10/2016   PLT 165 04/10/2016      Chemistry      Component Value Date/Time   NA 141 04/10/2016 0940   K 4.2 04/10/2016 0940   CL 107 03/07/2016 1612   CO2 24 04/10/2016 0940   BUN 11.5 04/10/2016 0940   CREATININE 0.8 04/10/2016 0940      Component Value Date/Time  CALCIUM 9.5 04/10/2016 0940   ALKPHOS 61 04/10/2016 0940   AST 33 04/10/2016 0940   ALT 63 (H) 04/10/2016 0940   BILITOT 0.23 04/10/2016 0940       No results found for: LABCA2  No components found for: LABCA125  No results for input(s): INR in the last 168 hours.  Urinalysis    Component Value Date/Time   COLORURINE YELLOW 10/14/2012 1954   APPEARANCEUR CLEAR 10/14/2012 1954   LABSPEC >1.030 (H) 10/14/2012 1954   PHURINE 6.0 10/14/2012 1954   GLUCOSEU NEGATIVE 10/14/2012 1954   HGBUR TRACE (A) 10/14/2012 1954   BILIRUBINUR NEGATIVE 10/14/2012 1954   KETONESUR 15 (A) 10/14/2012 1954   PROTEINUR NEGATIVE 10/14/2012 1954   UROBILINOGEN 0.2 10/14/2012 1954   NITRITE NEGATIVE 10/14/2012 1954   LEUKOCYTESUR NEGATIVE 10/14/2012 1954     STUDIES: Dg Chest 1 View  Result Date: 04/10/2016 CLINICAL DATA:  Left breast cancer.  Port-A-Cath placement. EXAM: CHEST 1 VIEW COMPARISON:  07/16/2014 FINDINGS: Right subclavian Port-A-Cath has been placed with the tip in the SVC. No pneumothorax. Surgical clips in the left breast and axilla. Heart and mediastinal contours are within normal limits. No focal opacities or effusions. No acute bony abnormality. IMPRESSION: Right Port-A-Cath tip in the SVC.  No pneumothorax. Electronically Signed   By: Rolm Baptise M.D.   On: 04/10/2016 11:03   Dg Fluoro Guide Cv Line-no Report  Result Date: 03/13/2016 There is no Radiologist interpretation  for this exam.    ELIGIBLE FOR AVAILABLE RESEARCH PROTOCOL: no  ASSESSMENT: 36 y.o. [place] woman status post left breast upper inner quadrant biopsy 01/15/2016 for a clinical T1c  N0, stage IA invasive ductal carcinoma, E-cadherin positive, estrogen receptor 90% positive, progesterone receptor 40% positive, both with moderate staining intensity, with an MIB-1 of 5%, but HER-2 amplified, with a signals ratio of 2.41 and the number per cell 6.50.  (a) One of 2 areas of abnormal calcifications, the more posterior one measuring 0.2 cm, was also biopsied and showed only atypical ductal hyperplasia  (b) a 4.1 cm area of non-masslike enhancement in the left breast is scheduled for biopsy 02/19/2016  (1) tamoxifen started 01/23/2016 neoadjuvantly pending a definitive surgical decision  (2) genetics testing 02/22/2016 through the Breast/Ovarian gene panel offered by [facility] found no deleterious mutations in  ATM, BARD1, BRCA1, BRCA2, BRIP1, CDH1, CHEK2, EPCAM, FANCC, MLH1, MSH2, MSH6, NBN, PALB2, PMS2, PTEN, RAD51C, RAD51D, TP53, and XRCC2.     (3) status post left lumpectomy and sentinel lymph node sampling 03/13/2016 for an mpT1c pN0, stage IA invasive ductal carcinoma, grade 2, with negative margins.  (4) paclitaxel weekly 12 and trastuzumab every 21 days beginning on 04/10/2016.  (5) trastuzumab to be continued to complete a year  (a) echocardiogram 02/11/2016 shows ejection fraction in the 60-65% range.  (6) adjuvant radiation to follow chemotherapy  (7) anti-estrogens to resume at the completion of local treatment, to be continued for 5-10 years  PLAN: Charley is doing well today.  I reviewed her lab work with her which was normal and she will proceed with treatment.  I spoke with Barnetta Chapel RN in treatment room and we had to do a chest xray to verify the port tip was in the SVC.  I again reviewed in detail the treatment plan with Anaira and her husband Joe.  I reviewed possible side effects, particularly noting the peripheral neuropathy and things to be aware of if she does experience it.  I also reviewed her echo results with  her.  She needs to get in to see Dr. Haroldine Laws or  Dr. Aundra Dubin in the Cardio-oncology clinic.  I reviewed this with Rekita and their important role on her treatment team.  We will get this set up.  I recommended she try Melatonin for sleep, we will see how she does with this next week.    Catrena will return on 2/22 for lab, appointment with Dr. Jana Hakim, and her second weekly Paclitaxel infusion.    She knows to call for any problems that may develop before her next visit.  A total of (30) minutes of face-to-face time was spent with this patient with greater than 50% of that time in counseling and care-coordination.   Charlestine Massed, NP   04/10/2016 12:09 PM Medical Oncology and Hematology [facility] [address] [place], [place] [address] Tel. [phone]    Fax. [phone]

## 2016-04-10 ENCOUNTER — Encounter: Payer: Self-pay | Admitting: *Deleted

## 2016-04-10 ENCOUNTER — Encounter: Payer: Self-pay | Admitting: Adult Health

## 2016-04-10 ENCOUNTER — Other Ambulatory Visit (HOSPITAL_BASED_OUTPATIENT_CLINIC_OR_DEPARTMENT_OTHER): Payer: PRIVATE HEALTH INSURANCE

## 2016-04-10 ENCOUNTER — Other Ambulatory Visit: Payer: Self-pay | Admitting: *Deleted

## 2016-04-10 ENCOUNTER — Ambulatory Visit (HOSPITAL_COMMUNITY)
Admission: RE | Admit: 2016-04-10 | Discharge: 2016-04-10 | Disposition: A | Discharge status: Home / Self Care | Payer: PRIVATE HEALTH INSURANCE | Source: Ambulatory Visit | Attending: Adult Health | Admitting: Adult Health

## 2016-04-10 ENCOUNTER — Other Ambulatory Visit: Payer: Self-pay | Admitting: Oncology

## 2016-04-10 ENCOUNTER — Ambulatory Visit (HOSPITAL_BASED_OUTPATIENT_CLINIC_OR_DEPARTMENT_OTHER): Payer: PRIVATE HEALTH INSURANCE

## 2016-04-10 ENCOUNTER — Ambulatory Visit (HOSPITAL_BASED_OUTPATIENT_CLINIC_OR_DEPARTMENT_OTHER): Payer: PRIVATE HEALTH INSURANCE | Admitting: Adult Health

## 2016-04-10 VITALS — BP 133/63 | HR 83 | Temp 98.1°F | Resp 18 | Ht 67.0 in | Wt 188.2 lb

## 2016-04-10 VITALS — BP 114/77 | HR 82 | Temp 99.5°F | Resp 18

## 2016-04-10 DIAGNOSIS — C50212 Malignant neoplasm of upper-inner quadrant of left female breast: Secondary | ICD-10-CM

## 2016-04-10 DIAGNOSIS — Z5111 Encounter for antineoplastic chemotherapy: Secondary | ICD-10-CM | POA: Diagnosis not present

## 2016-04-10 DIAGNOSIS — Z170 Estrogen receptor positive status [ER+]: Secondary | ICD-10-CM | POA: Insufficient documentation

## 2016-04-10 DIAGNOSIS — Z5112 Encounter for antineoplastic immunotherapy: Secondary | ICD-10-CM | POA: Diagnosis not present

## 2016-04-10 LAB — CBC WITH DIFFERENTIAL/PLATELET
BASO%: 1 % (ref 0.0–2.0)
Basophils Absolute: 0 10*3/uL (ref 0.0–0.1)
EOS%: 1.1 % (ref 0.0–7.0)
Eosinophils Absolute: 0.1 10*3/uL (ref 0.0–0.5)
HCT: 37.8 % (ref 34.8–46.6)
HGB: 12.2 g/dL (ref 11.6–15.9)
LYMPH%: 34.2 % (ref 14.0–49.7)
MCH: 27.3 pg (ref 25.1–34.0)
MCHC: 32.3 g/dL (ref 31.5–36.0)
MCV: 84.6 fL (ref 79.5–101.0)
MONO#: 0.5 10*3/uL (ref 0.1–0.9)
MONO%: 9.8 % (ref 0.0–14.0)
NEUT#: 2.5 10*3/uL (ref 1.5–6.5)
NEUT%: 53.9 % (ref 38.4–76.8)
Platelets: 165 10*3/uL (ref 145–400)
RBC: 4.47 10*6/uL (ref 3.70–5.45)
RDW: 13.2 % (ref 11.2–14.5)
WBC: 4.7 10*3/uL (ref 3.9–10.3)
lymph#: 1.6 10*3/uL (ref 0.9–3.3)

## 2016-04-10 LAB — COMPREHENSIVE METABOLIC PANEL
ALT: 63 U/L — ABNORMAL HIGH (ref 0–55)
AST: 33 U/L (ref 5–34)
Albumin: 4 g/dL (ref 3.5–5.0)
Alkaline Phosphatase: 61 U/L (ref 40–150)
Anion Gap: 8 mEq/L (ref 3–11)
BUN: 11.5 mg/dL (ref 7.0–26.0)
CO2: 24 mEq/L (ref 22–29)
Calcium: 9.5 mg/dL (ref 8.4–10.4)
Chloride: 109 mEq/L (ref 98–109)
Creatinine: 0.8 mg/dL (ref 0.6–1.1)
EGFR: >90 mL/min/{1.73_m2} (ref >90)
Glucose: 83 mg/dl (ref 70–140)
Potassium: 4.2 mEq/L (ref 3.5–5.1)
Sodium: 141 mEq/L (ref 136–145)
Total Bilirubin: 0.23 mg/dL (ref 0.20–1.20)
Total Protein: 7.3 g/dL (ref 6.4–8.3)

## 2016-04-10 MED ORDER — DIPHENHYDRAMINE HCL 25 MG PO CAPS
ORAL_CAPSULE | ORAL | Status: AC
  Filled 2016-04-10: qty 2

## 2016-04-10 MED ORDER — SODIUM CHLORIDE 0.9 % IV SOLN
Freq: Once | INTRAVENOUS | Status: AC
  Administered 2016-04-10: 11:00:00 via INTRAVENOUS

## 2016-04-10 MED ORDER — DIPHENHYDRAMINE HCL 25 MG PO CAPS
25.0000 mg | ORAL_CAPSULE | Freq: Once | ORAL | Status: AC
  Administered 2016-04-10: 25 mg via ORAL

## 2016-04-10 MED ORDER — FAMOTIDINE IN NACL 20-0.9 MG/50ML-% IV SOLN
INTRAVENOUS | Status: AC
  Filled 2016-04-10: qty 50

## 2016-04-10 MED ORDER — ACETAMINOPHEN 325 MG PO TABS
ORAL_TABLET | ORAL | Status: AC
  Filled 2016-04-10: qty 2

## 2016-04-10 MED ORDER — SODIUM CHLORIDE 0.9% FLUSH
10.0000 mL | INTRAVENOUS | Status: DC | PRN
  Administered 2016-04-10: 10 mL
  Filled 2016-04-10: qty 10

## 2016-04-10 MED ORDER — PACLITAXEL CHEMO INJECTION 300 MG/50ML
80.0000 mg/m2 | Freq: Once | INTRAVENOUS | Status: AC
  Administered 2016-04-10: 162 mg via INTRAVENOUS
  Filled 2016-04-10: qty 27

## 2016-04-10 MED ORDER — SODIUM CHLORIDE 0.9 % IV SOLN
20.0000 mg | Freq: Once | INTRAVENOUS | Status: AC
  Administered 2016-04-10: 20 mg via INTRAVENOUS
  Filled 2016-04-10: qty 2

## 2016-04-10 MED ORDER — TRASTUZUMAB CHEMO 150 MG IV SOLR
8.0000 mg/kg | Freq: Once | INTRAVENOUS | Status: AC
  Administered 2016-04-10: 672 mg via INTRAVENOUS
  Filled 2016-04-10: qty 32

## 2016-04-10 MED ORDER — DIPHENHYDRAMINE HCL 25 MG PO CAPS
ORAL_CAPSULE | ORAL | Status: AC
  Filled 2016-04-10: qty 1

## 2016-04-10 MED ORDER — FAMOTIDINE IN NACL 20-0.9 MG/50ML-% IV SOLN
20.0000 mg | Freq: Once | INTRAVENOUS | Status: AC
  Administered 2016-04-10: 20 mg via INTRAVENOUS

## 2016-04-10 MED ORDER — HEPARIN SOD (PORK) LOCK FLUSH 100 UNIT/ML IV SOLN
500.0000 [IU] | Freq: Once | INTRAVENOUS | Status: AC | PRN
  Administered 2016-04-10: 500 [IU]
  Filled 2016-04-10: qty 5

## 2016-04-10 MED ORDER — ACETAMINOPHEN 325 MG PO TABS
650.0000 mg | ORAL_TABLET | Freq: Once | ORAL | Status: AC
  Administered 2016-04-10: 650 mg via ORAL

## 2016-04-10 NOTE — Patient Instructions (Signed)
[facility] Discharge Instructions for Patients Receiving Chemotherapy  Today you received the following chemotherapy agents Herceptin and Taxol   To help prevent nausea and vomiting after your treatment, we encourage you to take your nausea medication as directed.    If you develop nausea and vomiting that is not controlled by your nausea medication, call the clinic.   BELOW ARE SYMPTOMS THAT SHOULD BE REPORTED IMMEDIATELY:  *FEVER GREATER THAN 100.5 F  *CHILLS WITH OR WITHOUT FEVER  NAUSEA AND VOMITING THAT IS NOT CONTROLLED WITH YOUR NAUSEA MEDICATION  *UNUSUAL SHORTNESS OF BREATH  *UNUSUAL BRUISING OR BLEEDING  TENDERNESS IN MOUTH AND THROAT WITH OR WITHOUT PRESENCE OF ULCERS  *URINARY PROBLEMS  *BOWEL PROBLEMS  UNUSUAL RASH Items with * indicate a potential emergency and should be followed up as soon as possible.  Feel free to call the clinic you have any questions or concerns. The clinic phone number is [phone].  Please show the [facility] at check-in to the Emergency Department and triage nurse.  Trastuzumab injection for infusion What is this medicine? TRASTUZUMAB (tras TOO zoo mab) is a monoclonal antibody. It is used to treat breast cancer and stomach cancer. This medicine may be used for other purposes; ask your health care provider or pharmacist if you have questions. COMMON BRAND NAME(S): Herceptin What should I tell my health care provider before I take this medicine? They need to know if you have any of these conditions: -heart disease -heart failure -infection (especially a virus infection such as chickenpox, cold sores, or herpes) -lung or breathing disease, like asthma -recent or ongoing radiation therapy -an unusual or allergic reaction to trastuzumab, benzyl alcohol, or other medications, foods, dyes, or preservatives -pregnant or trying to get pregnant -breast-feeding How should I use this medicine? This drug is given  as an infusion into a vein. It is administered in a hospital or clinic by a specially trained health care professional. Talk to your pediatrician regarding the use of this medicine in children. This medicine is not approved for use in children. Overdosage: If you think you have taken too much of this medicine contact a poison control center or emergency room at once. NOTE: This medicine is only for you. Do not share this medicine with others. What if I miss a dose? It is important not to miss a dose. Call your doctor or health care professional if you are unable to keep an appointment. What may interact with this medicine? -doxorubicin -warfarin This list may not describe all possible interactions. Give your health care provider a list of all the medicines, herbs, non-prescription drugs, or dietary supplements you use. Also tell them if you smoke, drink alcohol, or use illegal drugs. Some items may interact with your medicine. What should I watch for while using this medicine? Visit your doctor for checks on your progress. Report any side effects. Continue your course of treatment even though you feel ill unless your doctor tells you to stop. Call your doctor or health care professional for advice if you get a fever, chills or sore throat, or other symptoms of a cold or flu. Do not treat yourself. Try to avoid being around people who are sick. You may experience fever, chills and shaking during your first infusion. These effects are usually mild and can be treated with other medicines. Report any side effects during the infusion to your health care professional. Fever and chills usually do not happen with later infusions. Do not  become pregnant while taking this medicine or for 7 months after stopping it. Women should inform their doctor if they wish to become pregnant or think they might be pregnant. Women of child-bearing potential will need to have a negative pregnancy test before starting this  medicine. There is a potential for serious side effects to an unborn child. Talk to your health care professional or pharmacist for more information. Do not breast-feed an infant while taking this medicine or for 7 months after stopping it. Women must use effective birth control with this medicine. What side effects may I notice from receiving this medicine? Side effects that you should report to your doctor or health care professional as soon as possible: -breathing difficulties -chest pain or palpitations -cough -dizziness or fainting -fever or chills, sore throat -skin rash, itching or hives -swelling of the legs or ankles -unusually weak or tired Side effects that usually do not require medical attention (report to your doctor or health care professional if they continue or are bothersome): -loss of appetite -headache -muscle aches -nausea This list may not describe all possible side effects. Call your doctor for medical advice about side effects. You may report side effects to FDA at 1-800-FDA-1088. Where should I keep my medicine? This drug is given in a hospital or clinic and will not be stored at home. NOTE: This sheet is a summary. It may not cover all possible information. If you have questions about this medicine, talk to your doctor, pharmacist, or health care provider.  2017 [employer]/Gold Standard (2015-03-14 17:16:44)  Paclitaxel injection What is this medicine? PACLITAXEL (PAK li TAX el) is a chemotherapy drug. It targets fast dividing cells, like cancer cells, and causes these cells to die. This medicine is used to treat ovarian cancer, breast cancer, and other cancers. This medicine may be used for other purposes; ask your health care provider or pharmacist if you have questions. COMMON BRAND NAME(S): Onxol, Taxol What should I tell my health care provider before I take this medicine? They need to know if you have any of these conditions: -blood disorders -irregular  heartbeat -infection (especially a virus infection such as chickenpox, cold sores, or herpes) -liver disease -previous or ongoing radiation therapy -an unusual or allergic reaction to paclitaxel, alcohol, polyoxyethylated castor oil, other chemotherapy agents, other medicines, foods, dyes, or preservatives -pregnant or trying to get pregnant -breast-feeding How should I use this medicine? This drug is given as an infusion into a vein. It is administered in a hospital or clinic by a specially trained health care professional. Talk to your pediatrician regarding the use of this medicine in children. Special care may be needed. Overdosage: If you think you have taken too much of this medicine contact a poison control center or emergency room at once. NOTE: This medicine is only for you. Do not share this medicine with others. What if I miss a dose? It is important not to miss your dose. Call your doctor or health care professional if you are unable to keep an appointment. What may interact with this medicine? Do not take this medicine with any of the following medications: -disulfiram -metronidazole This medicine may also interact with the following medications: -cyclosporine -diazepam -ketoconazole -medicines to increase blood counts like filgrastim, pegfilgrastim, sargramostim -other chemotherapy drugs like cisplatin, doxorubicin, epirubicin, etoposide, teniposide, vincristine -quinidine -testosterone -vaccines -verapamil Talk to your doctor or health care professional before taking any of these medicines: -acetaminophen -aspirin -ibuprofen -ketoprofen -naproxen This list may not describe all  possible interactions. Give your health care provider a list of all the medicines, herbs, non-prescription drugs, or dietary supplements you use. Also tell them if you smoke, drink alcohol, or use illegal drugs. Some items may interact with your medicine. What should I watch for while using  this medicine? Your condition will be monitored carefully while you are receiving this medicine. You will need important blood work done while you are taking this medicine. This medicine can cause serious allergic reactions. To reduce your risk you will need to take other medicine(s) before treatment with this medicine. If you experience allergic reactions like skin rash, itching or hives, swelling of the face, lips, or tongue, tell your doctor or health care professional right away. In some cases, you may be given additional medicines to help with side effects. Follow all directions for their use. This drug may make you feel generally unwell. This is not uncommon, as chemotherapy can affect healthy cells as well as cancer cells. Report any side effects. Continue your course of treatment even though you feel ill unless your doctor tells you to stop. Call your doctor or health care professional for advice if you get a fever, chills or sore throat, or other symptoms of a cold or flu. Do not treat yourself. This drug decreases your body's ability to fight infections. Try to avoid being around people who are sick. This medicine may increase your risk to bruise or bleed. Call your doctor or health care professional if you notice any unusual bleeding. Be careful brushing and flossing your teeth or using a toothpick because you may get an infection or bleed more easily. If you have any dental work done, tell your dentist you are receiving this medicine. Avoid taking products that contain aspirin, acetaminophen, ibuprofen, naproxen, or ketoprofen unless instructed by your doctor. These medicines may hide a fever. Do not become pregnant while taking this medicine. Women should inform their doctor if they wish to become pregnant or think they might be pregnant. There is a potential for serious side effects to an unborn child. Talk to your health care professional or pharmacist for more information. Do not breast-feed  an infant while taking this medicine. Men are advised not to father a child while receiving this medicine. This product may contain alcohol. Ask your pharmacist or healthcare provider if this medicine contains alcohol. Be sure to tell all healthcare providers you are taking this medicine. Certain medicines, like metronidazole and disulfiram, can cause an unpleasant reaction when taken with alcohol. The reaction includes flushing, headache, nausea, vomiting, sweating, and increased thirst. The reaction can last from 30 minutes to several hours. What side effects may I notice from receiving this medicine? Side effects that you should report to your doctor or health care professional as soon as possible: -allergic reactions like skin rash, itching or hives, swelling of the face, lips, or tongue -low blood counts - This drug may decrease the number of white blood cells, red blood cells and platelets. You may be at increased risk for infections and bleeding. -signs of infection - fever or chills, cough, sore throat, pain or difficulty passing urine -signs of decreased platelets or bleeding - bruising, pinpoint red spots on the skin, black, tarry stools, nosebleeds -signs of decreased red blood cells - unusually weak or tired, fainting spells, lightheadedness -breathing problems -chest pain -high or low blood pressure -mouth sores -nausea and vomiting -pain, swelling, redness or irritation at the injection site -pain, tingling, numbness in the hands or  feet -slow or irregular heartbeat -swelling of the ankle, feet, hands Side effects that usually do not require medical attention (report to your doctor or health care professional if they continue or are bothersome): -bone pain -complete hair loss including hair on your head, underarms, pubic hair, eyebrows, and eyelashes -changes in the color of fingernails -diarrhea -loosening of the fingernails -loss of appetite -muscle or joint pain -red flush  to skin -sweating This list may not describe all possible side effects. Call your doctor for medical advice about side effects. You may report side effects to FDA at 1-800-FDA-1088. Where should I keep my medicine? This drug is given in a hospital or clinic and will not be stored at home. NOTE: This sheet is a summary. It may not cover all possible information. If you have questions about this medicine, talk to your doctor, pharmacist, or health care provider.  2017 [employer]/Gold Standard (2014-12-12 19:58:00)

## 2016-04-11 ENCOUNTER — Telehealth: Payer: Self-pay | Admitting: *Deleted

## 2016-04-11 NOTE — Telephone Encounter (Addendum)
-----   Message from Ma Hillock, RN sent at 04/10/2016  4:42 PM EST ----- Regarding: 1st Chemo F/U: [facility] Please call patient to follow up on her first infusion of herceptin/taxol. She tolerated her treatment today without any complications.  Per follow up call today - pt states she overall feels well with only complaint is headache.  She has taken 1000mg  tylenol with minimal benefit.  She is able to hydrate well.  Per above discussion with advice for use of benadryl for possible headache associated with zofran per anti nausea regimen.  [facility] verbalized understanding as well as to call this office with any further concerns.

## 2016-04-13 ENCOUNTER — Other Ambulatory Visit: Payer: Self-pay | Admitting: Oncology

## 2016-04-17 ENCOUNTER — Ambulatory Visit (HOSPITAL_BASED_OUTPATIENT_CLINIC_OR_DEPARTMENT_OTHER): Payer: PRIVATE HEALTH INSURANCE | Admitting: Oncology

## 2016-04-17 ENCOUNTER — Telehealth: Payer: Self-pay | Admitting: Oncology

## 2016-04-17 ENCOUNTER — Other Ambulatory Visit (HOSPITAL_BASED_OUTPATIENT_CLINIC_OR_DEPARTMENT_OTHER): Payer: PRIVATE HEALTH INSURANCE

## 2016-04-17 ENCOUNTER — Ambulatory Visit (HOSPITAL_BASED_OUTPATIENT_CLINIC_OR_DEPARTMENT_OTHER): Payer: PRIVATE HEALTH INSURANCE

## 2016-04-17 ENCOUNTER — Encounter: Payer: Self-pay | Admitting: *Deleted

## 2016-04-17 VITALS — BP 113/74 | HR 65 | Temp 98.7°F | Resp 18 | Ht 67.0 in | Wt 191.2 lb

## 2016-04-17 DIAGNOSIS — Z5111 Encounter for antineoplastic chemotherapy: Secondary | ICD-10-CM

## 2016-04-17 DIAGNOSIS — Z170 Estrogen receptor positive status [ER+]: Secondary | ICD-10-CM

## 2016-04-17 DIAGNOSIS — C50212 Malignant neoplasm of upper-inner quadrant of left female breast: Secondary | ICD-10-CM

## 2016-04-17 LAB — CBC WITH DIFFERENTIAL/PLATELET
BASO%: 1.2 % (ref 0.0–2.0)
Basophils Absolute: 0.1 10*3/uL (ref 0.0–0.1)
EOS%: 1.6 % (ref 0.0–7.0)
Eosinophils Absolute: 0.1 10*3/uL (ref 0.0–0.5)
HCT: 32.3 % — ABNORMAL LOW (ref 34.8–46.6)
HGB: 10.6 g/dL — ABNORMAL LOW (ref 11.6–15.9)
LYMPH%: 34.5 % (ref 14.0–49.7)
MCH: 27.6 pg (ref 25.1–34.0)
MCHC: 32.7 g/dL (ref 31.5–36.0)
MCV: 84.4 fL (ref 79.5–101.0)
MONO#: 0.3 10*3/uL (ref 0.1–0.9)
MONO%: 5.2 % (ref 0.0–14.0)
NEUT#: 2.8 10*3/uL (ref 1.5–6.5)
NEUT%: 57.5 % (ref 38.4–76.8)
Platelets: 167 10*3/uL (ref 145–400)
RBC: 3.83 10*6/uL (ref 3.70–5.45)
RDW: 13.1 % (ref 11.2–14.5)
WBC: 4.9 10*3/uL (ref 3.9–10.3)
lymph#: 1.7 10*3/uL (ref 0.9–3.3)

## 2016-04-17 LAB — COMPREHENSIVE METABOLIC PANEL
ALT: 65 U/L — ABNORMAL HIGH (ref 0–55)
AST: 28 U/L (ref 5–34)
Albumin: 3.8 g/dL (ref 3.5–5.0)
Alkaline Phosphatase: 65 U/L (ref 40–150)
Anion Gap: 7 mEq/L (ref 3–11)
BUN: 9.9 mg/dL (ref 7.0–26.0)
CO2: 25 mEq/L (ref 22–29)
Calcium: 9.3 mg/dL (ref 8.4–10.4)
Chloride: 107 mEq/L (ref 98–109)
Creatinine: 0.8 mg/dL (ref 0.6–1.1)
EGFR: >90 mL/min/{1.73_m2} (ref >90)
Glucose: 94 mg/dl (ref 70–140)
Potassium: 4 mEq/L (ref 3.5–5.1)
Sodium: 140 mEq/L (ref 136–145)
Total Bilirubin: 0.22 mg/dL (ref 0.20–1.20)
Total Protein: 6.8 g/dL (ref 6.4–8.3)

## 2016-04-17 MED ORDER — DIPHENHYDRAMINE HCL 50 MG/ML IJ SOLN
25.0000 mg | Freq: Once | INTRAMUSCULAR | Status: AC
  Administered 2016-04-17: 25 mg via INTRAVENOUS

## 2016-04-17 MED ORDER — DEXAMETHASONE SODIUM PHOSPHATE 10 MG/ML IJ SOLN
10.0000 mg | Freq: Once | INTRAMUSCULAR | Status: AC
  Administered 2016-04-17: 10 mg via INTRAVENOUS

## 2016-04-17 MED ORDER — SODIUM CHLORIDE 0.9% FLUSH
10.0000 mL | INTRAVENOUS | Status: DC | PRN
  Administered 2016-04-17: 10 mL
  Filled 2016-04-17: qty 10

## 2016-04-17 MED ORDER — PACLITAXEL CHEMO INJECTION 300 MG/50ML
80.0000 mg/m2 | Freq: Once | INTRAVENOUS | Status: AC
  Administered 2016-04-17: 162 mg via INTRAVENOUS
  Filled 2016-04-17: qty 27

## 2016-04-17 MED ORDER — HEPARIN SOD (PORK) LOCK FLUSH 100 UNIT/ML IV SOLN
500.0000 [IU] | Freq: Once | INTRAVENOUS | Status: AC | PRN
  Administered 2016-04-17: 500 [IU]
  Filled 2016-04-17: qty 5

## 2016-04-17 MED ORDER — FAMOTIDINE IN NACL 20-0.9 MG/50ML-% IV SOLN
INTRAVENOUS | Status: AC
  Filled 2016-04-17: qty 50

## 2016-04-17 MED ORDER — DIPHENHYDRAMINE HCL 50 MG/ML IJ SOLN
INTRAMUSCULAR | Status: AC
  Filled 2016-04-17: qty 1

## 2016-04-17 MED ORDER — FAMOTIDINE IN NACL 20-0.9 MG/50ML-% IV SOLN
20.0000 mg | Freq: Once | INTRAVENOUS | Status: AC
  Administered 2016-04-17: 20 mg via INTRAVENOUS

## 2016-04-17 MED ORDER — SODIUM CHLORIDE 0.9 % IV SOLN
Freq: Once | INTRAVENOUS | Status: AC
  Administered 2016-04-17: 12:00:00 via INTRAVENOUS

## 2016-04-17 MED ORDER — DEXAMETHASONE SODIUM PHOSPHATE 10 MG/ML IJ SOLN
INTRAMUSCULAR | Status: AC
  Filled 2016-04-17: qty 1

## 2016-04-17 NOTE — Progress Notes (Signed)
[place]  [phone] [phone]     ID: Virginia [name] DOB: 1980/03/11  MR#: [id number]  WLS#:[id number]  Patient Care Team: [name], MD as PCP - General (Family Medicine) Stark Klein, MD as Consulting Physician (General Surgery) Chauncey Cruel, MD as Consulting Physician (Oncology) Eppie Gibson, MD as Attending Physician (Radiation Oncology) Minette Headland, NP as Nurse Practitioner (Hematology and Oncology) Chauncey Cruel, MD OTHER MD:  CHIEF COMPLAINT:  Estrogen receptor positive breast cancer  CURRENT TREATMENT:  Adjuvant chemotherapy and anti-HER-2 immunotherapy  BREAST CANCER HISTORY: From the original intake note:  Virginia [name] noted some itching on her breasts and since this was the symptom that preceded her grandmother's breast cancer she underwent baseline bilateral screening mammography at [facility] 01/10/2016. The breast density was category B. In the left breast central to the nipple there was a 1.7 cm irregular high density mass with pleomorphic calcifications. Accordingly the patient was brought back 01/14/2016 for left diagnostic mammography and ultrasonography. This confirmed a 0.8 cm irregular mass in the left breast at the 10:00 position. 0.5 cm from the nipple lateral and posterior to that there was a 0.2 cm cluster of amorphous calcifications with an associated density. Anterior to the index mass was a 0.5 cm cluster of amorphous calcifications. All 3 findings taken together 3 cm. Ultrasound of the left breast found a 1 cm irregular mass in the left breast at the 10:00 position retroareolarly. There were no other sonographic abnormalities and the left axilla was sonographically benign  On 01/15/2016 the patient underwent core needle biopsy of the left breast index lesion at 10:00, and this found invasive ductal carcinoma, E-cadherin positive, estrogen receptor 90% positive, progesterone receptor 40% positive, both with  moderate staining intensity, with an MIB-1 of 5%, but HER-2 amplified, with a signals ratio of 2.41 and the number per cell 6.50. One of the 2 areas of abnormal calcifications, the more posterior one measuring 0.2 cm, was also biopsied and showed only atypical ductal hyperplasia (S [id number]).  Her subsequent history is as detailed below  INTERVAL HISTORY: Virginia [name] returns today for follow-up of her triple positive breast cancer accompanied by her husband. She received her first cycle of weekly paclitaxel last week, with 12 doses planned. She also received her first dose of trastuzumab, which will be repeated every 3 weeks for a year.   REVIEW OF SYSTEMS: Virginia [name] did generally well with her first cycle of treatment but she did have some side effects. She said nausea was "bad". She talks Zofran for this and it was very effective but it gave her headache she took Tylenol and Benadryl for that and it cleared the headache. She also had a sore throat on days 3 and 4. She had some reflux symptoms. She was constipated which is a new problem for her. She is all this with Colace and prune juice. Her port worked fine. She had no mouth sores. She is continuing to work full-time. She is not exercising regularly. A detailed review of systems today was otherwise stable  PAST MEDICAL HISTORY: Past Medical History:  Diagnosis Date  . Anemia    as a child  . Anxiety   . Cholestasis   . Elevated liver enzymes    during pregnancy March 2015  . Family history of breast cancer   . Family history of prostate cancer   . GERD (gastroesophageal reflux disease)    one episode  . Headache   .  Hypothyroidism   . Malignant neoplasm of upper-inner quadrant of left female breast ([place]) 01/16/2016    PAST SURGICAL HISTORY: Past Surgical History:  Procedure Laterality Date  . BREAST LUMPECTOMY WITH RADIOACTIVE SEED AND SENTINEL LYMPH NODE BIOPSY Left 03/13/2016   Procedure: LEFT BREAST LUMPECTOMY WITH  RADIOACTIVE SEED X 2 AND SENTINEL LYMPH NODE BIOPSY;  Surgeon: Stark Klein, MD;  Location: [facility];  Service: General;  Laterality: Left;  . NO PAST SURGERIES    . PORTACATH PLACEMENT Right 03/13/2016   Procedure: INSERTION PORT-A-CATH;  Surgeon: Stark Klein, MD;  Location: [facility];  Service: General;  Laterality: Right;    FAMILY HISTORY Family History  Problem Relation Age of Onset  . Hypertension Mother   . Hypertension Father   . Prostate cancer Father   . Liver cancer Maternal Grandmother   . Breast cancer Paternal Grandmother     dx in her 55s  . Lung cancer Paternal Aunt     smoker  . Heart attack Paternal Grandfather   . Liver cancer Other   The patient's parents are alive, in their mid 62s as of November 2017. The patient father was diagnosed with prostate cancer at age 63. A paternal aunt was diagnosed with lung cancer in her late 67s. Also the paternal grandmother was diagnosed with breast cancer at the age of 41. On the mother's side there are 2 cases of liver cancer, age of diagnosis unknown.  GYNECOLOGIC HISTORY:  No LMP recorded.  Menarche age 65, first live birth age 20, she is Virginia [name] P2. She still having periods, most recently 01/21/2016, but they are no longer regular. She remotely used a number ring and oral contraceptives but stopped in 2016.Currently there use barrier methods for contraception.   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 Her husband Virginia [name] works as a Printmaker for [employer] (for example he worked on our (). Their children are Virginia [name] and Virginia [name],, aged 25 and 2 as of November 2017. Attention and is not a church attender    ADVANCED DIRECTIVES: Not in place   HEALTH MAINTENANCE: Social History  Substance Use Topics  . Smoking status: Never Smoker  . Smokeless tobacco: Never Used  . Alcohol use Yes     Comment: occasional     Colonoscopy:  PAP:2016  Bone density:   Allergies    Allergen Reactions  . Amoxicillin Diarrhea and Nausea Only    Has patient had a PCN reaction causing immediate rash, facial/tongue/throat swelling, SOB or lightheadedness with hypotension: No Has patient had a PCN reaction causing severe rash involving mucus membranes or skin necrosis: No Has patient had a PCN reaction that required hospitalization No Has patient had a PCN reaction occurring within the last 10 years: Yes If all of the above answers are "NO", then may proceed with Cephalosporin use.     Current Outpatient Prescriptions  Medication Sig Dispense Refill  . acetaminophen (TYLENOL) 500 MG tablet Take 500-1,000 mg by mouth every 6 (six) hours as needed (for headaches.).    Marland Kitchen fluticasone (FLONASE) 50 MCG/ACT nasal spray Place 1-2 sprays into both nostrils daily as needed for allergies or rhinitis.    Marland Kitchen ibuprofen (ADVIL,MOTRIN) 200 MG tablet Take 400 mg by mouth every 8 (eight) hours as needed (for pain.).    Marland Kitchen lidocaine-prilocaine (EMLA) cream Apply to affected area once 30 g 3  . Multiple Vitamins-Minerals (MULTIVITAMIN GUMMIES ADULT PO) Take 2 tablets by  mouth daily.    . ondansetron (ZOFRAN) 8 MG tablet Take 1 tablet (8 mg total) by mouth 2 (two) times daily as needed (Nausea or vomiting). 30 tablet 1  . prochlorperazine (COMPAZINE) 10 MG tablet Take 1 tablet (10 mg total) by mouth every 6 (six) hours as needed (Nausea or vomiting). 30 tablet 1   No current facility-administered medications for this visit.     OBJECTIVE: Young-appearing woman Who appears stated age 70:   04/17/16 1034  BP: 113/74  Pulse: 65  Resp: 18  Temp: 98.7 F (37.1 C)     Body mass index is 29.95 kg/m.    ECOG FS:1 - Symptomatic but completely ambulatory   Sclerae unicteric, pupils round and equal Oropharynx clear and moist-- no thrush or other lesions No cervical or supraclavicular adenopathy Lungs no rales or rhonchi Heart regular rate and rhythm Abd soft, nontender, positive bowel  sounds MSK no focal spinal tenderness, no upper extremity lymphedema Neuro: nonfocal, well oriented, appropriate affect Breasts: Deferred   LAB RESULTS:  CMP     Component Value Date/Time   NA 141 04/10/2016 0940   K 4.2 04/10/2016 0940   CL 107 03/07/2016 1612   CO2 24 04/10/2016 0940   GLUCOSE 83 04/10/2016 0940   BUN 11.5 04/10/2016 0940   CREATININE 0.8 04/10/2016 0940   CALCIUM 9.5 04/10/2016 0940   PROT 7.3 04/10/2016 0940   ALBUMIN 4.0 04/10/2016 0940   AST 33 04/10/2016 0940   ALT 63 (H) 04/10/2016 0940   ALKPHOS 61 04/10/2016 0940   BILITOT 0.23 04/10/2016 0940   GFRNONAA >60 03/07/2016 1612   GFRAA >60 03/07/2016 1612    INo results found for: SPEP, UPEP  Lab Results  Component Value Date   WBC 4.9 04/17/2016   NEUTROABS 2.8 04/17/2016   HGB 10.6 (L) 04/17/2016   HCT 32.3 (L) 04/17/2016   MCV 84.4 04/17/2016   PLT 167 04/17/2016      Chemistry      Component Value Date/Time   NA 141 04/10/2016 0940   K 4.2 04/10/2016 0940   CL 107 03/07/2016 1612   CO2 24 04/10/2016 0940   BUN 11.5 04/10/2016 0940   CREATININE 0.8 04/10/2016 0940      Component Value Date/Time   CALCIUM 9.5 04/10/2016 0940   ALKPHOS 61 04/10/2016 0940   AST 33 04/10/2016 0940   ALT 63 (H) 04/10/2016 0940   BILITOT 0.23 04/10/2016 0940       No results found for: LABCA2  No components found for: LABCA125  No results for input(s): INR in the last 168 hours.  Urinalysis    Component Value Date/Time   COLORURINE YELLOW 10/14/2012 1954   APPEARANCEUR CLEAR 10/14/2012 1954   LABSPEC >1.030 (H) 10/14/2012 1954   PHURINE 6.0 10/14/2012 1954   GLUCOSEU NEGATIVE 10/14/2012 1954   HGBUR TRACE (A) 10/14/2012 1954   BILIRUBINUR NEGATIVE 10/14/2012 1954   KETONESUR 15 (A) 10/14/2012 1954   PROTEINUR NEGATIVE 10/14/2012 1954   UROBILINOGEN 0.2 10/14/2012 1954   NITRITE NEGATIVE 10/14/2012 1954   LEUKOCYTESUR NEGATIVE 10/14/2012 1954     STUDIES: Dg Chest 1  View  Result Date: 04/10/2016 CLINICAL DATA:  Left breast cancer.  Port-A-Cath placement. EXAM: CHEST 1 VIEW COMPARISON:  07/16/2014 FINDINGS: Right subclavian Port-A-Cath has been placed with the tip in the SVC. No pneumothorax. Surgical clips in the left breast and axilla. Heart and mediastinal contours are within normal limits. No focal opacities or effusions. No acute  bony abnormality. IMPRESSION: Right Port-A-Cath tip in the SVC.  No pneumothorax. Electronically Signed   By: Rolm Baptise M.D.   On: 04/10/2016 11:03     ELIGIBLE FOR AVAILABLE RESEARCH PROTOCOL: no  ASSESSMENT: 36 y.o. [place] woman status post left breast upper inner quadrant biopsy 01/15/2016 for a clinical T1c N0, stage IA invasive ductal carcinoma, E-cadherin positive, estrogen receptor 90% positive, progesterone receptor 40% positive, both with moderate staining intensity, with an MIB-1 of 5%, but HER-2 amplified, with a signals ratio of 2.41 and the number per cell 6.50.  (a) One of 2 areas of abnormal calcifications, the more posterior one measuring 0.2 cm, was also biopsied and showed only atypical ductal hyperplasia  (b) a 4.1 cm area of non-masslike enhancement in the left breast is scheduled for biopsy 02/19/2016  (1) tamoxifen started 01/23/2016 neoadjuvantly, pending a definitive surgical decision  (2) genetics testing 02/22/2016 through the Breast/Ovarian gene panel offered by [facility] found no deleterious mutations in  ATM, BARD1, BRCA1, BRCA2, BRIP1, CDH1, CHEK2, EPCAM, FANCC, MLH1, MSH2, MSH6, NBN, PALB2, PMS2, PTEN, RAD51C, RAD51D, TP53, and XRCC2.     (3) status post left lumpectomy and sentinel lymph node sampling 03/13/2016 for an mpT1c pN0, stage IA invasive ductal carcinoma, grade 2, with negative margins.  (4) paclitaxel weekly 12 and trastuzumab every 21 days beginning on 04/10/2016.  (5) trastuzumab to be continued to complete a year  (a) echocardiogram 02/11/2016 shows ejection fraction in the  60-65% range.  (6) adjuvant radiation to follow chemotherapy  (7) anti-estrogens to resume at the completion of local treatment, to be continued for 5-10 years  PLAN: Kam[place]e tolerated her first cycle of chemotherapy generally well, and in particular she had no reaction at all to the antibodies.  She did have nausea and she did have a headache from the ondansetron. I suggested she take Compazine instead but she is intimidated by the side effect profile that medication. We decided she will try half a tablet this evening and tomorrow morning and if she tolerates it well she can take a full tablet the next time. If the Compazine does not control the nausea she cancer only use the ondansetron expecting to have a headache again which she can control with Tylenol and Benadryl.  She will continue constipated unless she does something about it so continuing the Colace and fiber and adding MiraLAX as needed should take care of that problem. She had many diet questions today which we addressed.  Otherwise we are proceeding with treatment. I am dropping her Decadron dose and will drop it again with dose #3. After that we will continue at 4 mg weekly with premeds.  I again reminded her to alert [place] if she develops any numbness or tingling in her finger pads or 2 pads  She knows to call for any problems that may develop before her next visit.  Chauncey Cruel, MD   04/17/2016 10:51 AM Medical Oncology and Hematology [facility] [address] [place], [place] [address] Tel. [phone]    Fax. [phone]

## 2016-04-17 NOTE — Patient Instructions (Signed)
[facility] Discharge Instructions for Patients Receiving Chemotherapy  Today you received the following chemotherapy agents Taxol   To help prevent nausea and vomiting after your treatment, we encourage you to take your nausea medication as directed.    If you develop nausea and vomiting that is not controlled by your nausea medication, call the clinic.   BELOW ARE SYMPTOMS THAT SHOULD BE REPORTED IMMEDIATELY:  *FEVER GREATER THAN 100.5 F  *CHILLS WITH OR WITHOUT FEVER  NAUSEA AND VOMITING THAT IS NOT CONTROLLED WITH YOUR NAUSEA MEDICATION  *UNUSUAL SHORTNESS OF BREATH  *UNUSUAL BRUISING OR BLEEDING  TENDERNESS IN MOUTH AND THROAT WITH OR WITHOUT PRESENCE OF ULCERS  *URINARY PROBLEMS  *BOWEL PROBLEMS  UNUSUAL RASH Items with * indicate a potential emergency and should be followed up as soon as possible.  Feel free to call the clinic you have any questions or concerns. The clinic phone number is [phone].  Please show the [facility] at check-in to the Emergency Department and triage nurse.  Trastuzumab injection for infusion What is this medicine? TRASTUZUMAB (tras TOO zoo mab) is a monoclonal antibody. It is used to treat breast cancer and stomach cancer. This medicine may be used for other purposes; ask your health care provider or pharmacist if you have questions. COMMON BRAND NAME(S): Herceptin What should I tell my health care provider before I take this medicine? They need to know if you have any of these conditions: -heart disease -heart failure -infection (especially a virus infection such as chickenpox, cold sores, or herpes) -lung or breathing disease, like asthma -recent or ongoing radiation therapy -an unusual or allergic reaction to trastuzumab, benzyl alcohol, or other medications, foods, dyes, or preservatives -pregnant or trying to get pregnant -breast-feeding How should I use this medicine? This drug is given as an infusion  into a vein. It is administered in a hospital or clinic by a specially trained health care professional. Talk to your pediatrician regarding the use of this medicine in children. This medicine is not approved for use in children. Overdosage: If you think you have taken too much of this medicine contact a poison control center or emergency room at once. NOTE: This medicine is only for you. Do not share this medicine with others. What if I miss a dose? It is important not to miss a dose. Call your doctor or health care professional if you are unable to keep an appointment. What may interact with this medicine? -doxorubicin -warfarin This list may not describe all possible interactions. Give your health care provider a list of all the medicines, herbs, non-prescription drugs, or dietary supplements you use. Also tell them if you smoke, drink alcohol, or use illegal drugs. Some items may interact with your medicine. What should I watch for while using this medicine? Visit your doctor for checks on your progress. Report any side effects. Continue your course of treatment even though you feel ill unless your doctor tells you to stop. Call your doctor or health care professional for advice if you get a fever, chills or sore throat, or other symptoms of a cold or flu. Do not treat yourself. Try to avoid being around people who are sick. You may experience fever, chills and shaking during your first infusion. These effects are usually mild and can be treated with other medicines. Report any side effects during the infusion to your health care professional. Fever and chills usually do not happen with later infusions. Do not become pregnant  while taking this medicine or for 7 months after stopping it. Women should inform their doctor if they wish to become pregnant or think they might be pregnant. Women of child-bearing potential will need to have a negative pregnancy test before starting this medicine. There is  a potential for serious side effects to an unborn child. Talk to your health care professional or pharmacist for more information. Do not breast-feed an infant while taking this medicine or for 7 months after stopping it. Women must use effective birth control with this medicine. What side effects may I notice from receiving this medicine? Side effects that you should report to your doctor or health care professional as soon as possible: -breathing difficulties -chest pain or palpitations -cough -dizziness or fainting -fever or chills, sore throat -skin rash, itching or hives -swelling of the legs or ankles -unusually weak or tired Side effects that usually do not require medical attention (report to your doctor or health care professional if they continue or are bothersome): -loss of appetite -headache -muscle aches -nausea This list may not describe all possible side effects. Call your doctor for medical advice about side effects. You may report side effects to FDA at 1-800-FDA-1088. Where should I keep my medicine? This drug is given in a hospital or clinic and will not be stored at home. NOTE: This sheet is a summary. It may not cover all possible information. If you have questions about this medicine, talk to your doctor, pharmacist, or health care provider.  2017 Elsevier/Gold Standard (2015-03-14 17:16:44)  Paclitaxel injection What is this medicine? PACLITAXEL (PAK li TAX el) is a chemotherapy drug. It targets fast dividing cells, like cancer cells, and causes these cells to die. This medicine is used to treat ovarian cancer, breast cancer, and other cancers. This medicine may be used for other purposes; ask your health care provider or pharmacist if you have questions. COMMON BRAND NAME(S): Onxol, Taxol What should I tell my health care provider before I take this medicine? They need to know if you have any of these conditions: -blood disorders -irregular  heartbeat -infection (especially a virus infection such as chickenpox, cold sores, or herpes) -liver disease -previous or ongoing radiation therapy -an unusual or allergic reaction to paclitaxel, alcohol, polyoxyethylated castor oil, other chemotherapy agents, other medicines, foods, dyes, or preservatives -pregnant or trying to get pregnant -breast-feeding How should I use this medicine? This drug is given as an infusion into a vein. It is administered in a hospital or clinic by a specially trained health care professional. Talk to your pediatrician regarding the use of this medicine in children. Special care may be needed. Overdosage: If you think you have taken too much of this medicine contact a poison control center or emergency room at once. NOTE: This medicine is only for you. Do not share this medicine with others. What if I miss a dose? It is important not to miss your dose. Call your doctor or health care professional if you are unable to keep an appointment. What may interact with this medicine? Do not take this medicine with any of the following medications: -disulfiram -metronidazole This medicine may also interact with the following medications: -cyclosporine -diazepam -ketoconazole -medicines to increase blood counts like filgrastim, pegfilgrastim, sargramostim -other chemotherapy drugs like cisplatin, doxorubicin, epirubicin, etoposide, teniposide, vincristine -quinidine -testosterone -vaccines -verapamil Talk to your doctor or health care professional before taking any of these medicines: -acetaminophen -aspirin -ibuprofen -ketoprofen -naproxen This list may not describe all possible interactions.  Give your health care provider a list of all the medicines, herbs, non-prescription drugs, or dietary supplements you use. Also tell them if you smoke, drink alcohol, or use illegal drugs. Some items may interact with your medicine. What should I watch for while using  this medicine? Your condition will be monitored carefully while you are receiving this medicine. You will need important blood work done while you are taking this medicine. This medicine can cause serious allergic reactions. To reduce your risk you will need to take other medicine(s) before treatment with this medicine. If you experience allergic reactions like skin rash, itching or hives, swelling of the face, lips, or tongue, tell your doctor or health care professional right away. In some cases, you may be given additional medicines to help with side effects. Follow all directions for their use. This drug may make you feel generally unwell. This is not uncommon, as chemotherapy can affect healthy cells as well as cancer cells. Report any side effects. Continue your course of treatment even though you feel ill unless your doctor tells you to stop. Call your doctor or health care professional for advice if you get a fever, chills or sore throat, or other symptoms of a cold or flu. Do not treat yourself. This drug decreases your body's ability to fight infections. Try to avoid being around people who are sick. This medicine may increase your risk to bruise or bleed. Call your doctor or health care professional if you notice any unusual bleeding. Be careful brushing and flossing your teeth or using a toothpick because you may get an infection or bleed more easily. If you have any dental work done, tell your dentist you are receiving this medicine. Avoid taking products that contain aspirin, acetaminophen, ibuprofen, naproxen, or ketoprofen unless instructed by your doctor. These medicines may hide a fever. Do not become pregnant while taking this medicine. Women should inform their doctor if they wish to become pregnant or think they might be pregnant. There is a potential for serious side effects to an unborn child. Talk to your health care professional or pharmacist for more information. Do not breast-feed  an infant while taking this medicine. Men are advised not to father a child while receiving this medicine. This product may contain alcohol. Ask your pharmacist or healthcare provider if this medicine contains alcohol. Be sure to tell all healthcare providers you are taking this medicine. Certain medicines, like metronidazole and disulfiram, can cause an unpleasant reaction when taken with alcohol. The reaction includes flushing, headache, nausea, vomiting, sweating, and increased thirst. The reaction can last from 30 minutes to several hours. What side effects may I notice from receiving this medicine? Side effects that you should report to your doctor or health care professional as soon as possible: -allergic reactions like skin rash, itching or hives, swelling of the face, lips, or tongue -low blood counts - This drug may decrease the number of white blood cells, red blood cells and platelets. You may be at increased risk for infections and bleeding. -signs of infection - fever or chills, cough, sore throat, pain or difficulty passing urine -signs of decreased platelets or bleeding - bruising, pinpoint red spots on the skin, black, tarry stools, nosebleeds -signs of decreased red blood cells - unusually weak or tired, fainting spells, lightheadedness -breathing problems -chest pain -high or low blood pressure -mouth sores -nausea and vomiting -pain, swelling, redness or irritation at the injection site -pain, tingling, numbness in the hands or feet -slow  or irregular heartbeat -swelling of the ankle, feet, hands Side effects that usually do not require medical attention (report to your doctor or health care professional if they continue or are bothersome): -bone pain -complete hair loss including hair on your head, underarms, pubic hair, eyebrows, and eyelashes -changes in the color of fingernails -diarrhea -loosening of the fingernails -loss of appetite -muscle or joint pain -red flush  to skin -sweating This list may not describe all possible side effects. Call your doctor for medical advice about side effects. You may report side effects to FDA at 1-800-FDA-1088. Where should I keep my medicine? This drug is given in a hospital or clinic and will not be stored at home. NOTE: This sheet is a summary. It may not cover all possible information. If you have questions about this medicine, talk to your doctor, pharmacist, or health care provider.  2017 Elsevier/Gold Standard (2014-12-12 19:58:00)

## 2016-04-17 NOTE — Telephone Encounter (Signed)
sch pt next MD appts per [facility] schedule msg. Pt want to keep chemo in afternoon due to work schedule

## 2016-04-24 ENCOUNTER — Ambulatory Visit (HOSPITAL_BASED_OUTPATIENT_CLINIC_OR_DEPARTMENT_OTHER): Payer: PRIVATE HEALTH INSURANCE | Admitting: Oncology

## 2016-04-24 ENCOUNTER — Other Ambulatory Visit (HOSPITAL_BASED_OUTPATIENT_CLINIC_OR_DEPARTMENT_OTHER): Payer: PRIVATE HEALTH INSURANCE

## 2016-04-24 ENCOUNTER — Ambulatory Visit (HOSPITAL_BASED_OUTPATIENT_CLINIC_OR_DEPARTMENT_OTHER): Payer: PRIVATE HEALTH INSURANCE

## 2016-04-24 VITALS — BP 119/75 | HR 70 | Temp 98.3°F | Resp 18 | Wt 192.5 lb

## 2016-04-24 VITALS — BP 131/73 | HR 77 | Temp 98.4°F | Resp 16

## 2016-04-24 DIAGNOSIS — Z170 Estrogen receptor positive status [ER+]: Secondary | ICD-10-CM

## 2016-04-24 DIAGNOSIS — C50212 Malignant neoplasm of upper-inner quadrant of left female breast: Secondary | ICD-10-CM

## 2016-04-24 DIAGNOSIS — Z5111 Encounter for antineoplastic chemotherapy: Secondary | ICD-10-CM

## 2016-04-24 LAB — COMPREHENSIVE METABOLIC PANEL
ALT: 49 U/L (ref 0–55)
AST: 25 U/L (ref 5–34)
Albumin: 3.7 g/dL (ref 3.5–5.0)
Alkaline Phosphatase: 71 U/L (ref 40–150)
Anion Gap: 8 mEq/L (ref 3–11)
BUN: 8.6 mg/dL (ref 7.0–26.0)
CO2: 25 mEq/L (ref 22–29)
Calcium: 9 mg/dL (ref 8.4–10.4)
Chloride: 107 mEq/L (ref 98–109)
Creatinine: 0.8 mg/dL (ref 0.6–1.1)
EGFR: >90 mL/min/{1.73_m2} (ref >90)
Glucose: 90 mg/dl (ref 70–140)
Potassium: 4.2 mEq/L (ref 3.5–5.1)
Sodium: 140 mEq/L (ref 136–145)
Total Bilirubin: <0.22 mg/dL (ref 0.20–1.20)
Total Protein: 6.8 g/dL (ref 6.4–8.3)

## 2016-04-24 LAB — CBC WITH DIFFERENTIAL/PLATELET
BASO%: 0.5 % (ref 0.0–2.0)
Basophils Absolute: 0 10*3/uL (ref 0.0–0.1)
EOS%: 3.3 % (ref 0.0–7.0)
Eosinophils Absolute: 0.1 10*3/uL (ref 0.0–0.5)
HCT: 33.3 % — ABNORMAL LOW (ref 34.8–46.6)
HGB: 10.6 g/dL — ABNORMAL LOW (ref 11.6–15.9)
LYMPH%: 42.4 % (ref 14.0–49.7)
MCH: 27.5 pg (ref 25.1–34.0)
MCHC: 31.8 g/dL (ref 31.5–36.0)
MCV: 86.5 fL (ref 79.5–101.0)
MONO#: 0.3 10*3/uL (ref 0.1–0.9)
MONO%: 6.5 % (ref 0.0–14.0)
NEUT#: 1.9 10*3/uL (ref 1.5–6.5)
NEUT%: 47.3 % (ref 38.4–76.8)
Platelets: 236 10*3/uL (ref 145–400)
RBC: 3.85 10*6/uL (ref 3.70–5.45)
RDW: 13.3 % (ref 11.2–14.5)
WBC: 4 10*3/uL (ref 3.9–10.3)
lymph#: 1.7 10*3/uL (ref 0.9–3.3)

## 2016-04-24 MED ORDER — SODIUM CHLORIDE 0.9% FLUSH
10.0000 mL | INTRAVENOUS | Status: DC | PRN
  Administered 2016-04-24: 10 mL
  Filled 2016-04-24: qty 10

## 2016-04-24 MED ORDER — DIPHENHYDRAMINE HCL 50 MG/ML IJ SOLN
25.0000 mg | Freq: Once | INTRAMUSCULAR | Status: AC
  Administered 2016-04-24: 25 mg via INTRAVENOUS

## 2016-04-24 MED ORDER — FAMOTIDINE IN NACL 20-0.9 MG/50ML-% IV SOLN
INTRAVENOUS | Status: AC
  Filled 2016-04-24: qty 50

## 2016-04-24 MED ORDER — PACLITAXEL CHEMO INJECTION 300 MG/50ML
80.0000 mg/m2 | Freq: Once | INTRAVENOUS | Status: AC
  Administered 2016-04-24: 162 mg via INTRAVENOUS
  Filled 2016-04-24: qty 27

## 2016-04-24 MED ORDER — DIPHENHYDRAMINE HCL 50 MG/ML IJ SOLN
INTRAMUSCULAR | Status: AC
  Filled 2016-04-24: qty 1

## 2016-04-24 MED ORDER — HEPARIN SOD (PORK) LOCK FLUSH 100 UNIT/ML IV SOLN
500.0000 [IU] | Freq: Once | INTRAVENOUS | Status: AC | PRN
Start: 2016-04-24 — End: 2016-04-24
  Administered 2016-04-24: 500 [IU]
  Filled 2016-04-24: qty 5

## 2016-04-24 MED ORDER — FAMOTIDINE IN NACL 20-0.9 MG/50ML-% IV SOLN
20.0000 mg | Freq: Once | INTRAVENOUS | Status: AC
  Administered 2016-04-24: 20 mg via INTRAVENOUS

## 2016-04-24 MED ORDER — DEXAMETHASONE SODIUM PHOSPHATE 10 MG/ML IJ SOLN
4.0000 mg | Freq: Once | INTRAMUSCULAR | Status: AC
  Administered 2016-04-24: 4 mg via INTRAVENOUS

## 2016-04-24 MED ORDER — DEXAMETHASONE SODIUM PHOSPHATE 10 MG/ML IJ SOLN
INTRAMUSCULAR | Status: AC
  Filled 2016-04-24: qty 1

## 2016-04-24 MED ORDER — SODIUM CHLORIDE 0.9 % IV SOLN
Freq: Once | INTRAVENOUS | Status: AC
  Administered 2016-04-24: 15:00:00 via INTRAVENOUS

## 2016-04-24 NOTE — Patient Instructions (Signed)
[facility] Discharge Instructions for Patients Receiving Chemotherapy  Today you received the following chemotherapy agents Taxol   To help prevent nausea and vomiting after your treatment, we encourage you to take your nausea medication as directed.   If you develop nausea and vomiting that is not controlled by your nausea medication, call the clinic.   BELOW ARE SYMPTOMS THAT SHOULD BE REPORTED IMMEDIATELY:  *FEVER GREATER THAN 100.5 F  *CHILLS WITH OR WITHOUT FEVER  NAUSEA AND VOMITING THAT IS NOT CONTROLLED WITH YOUR NAUSEA MEDICATION  *UNUSUAL SHORTNESS OF BREATH  *UNUSUAL BRUISING OR BLEEDING  TENDERNESS IN MOUTH AND THROAT WITH OR WITHOUT PRESENCE OF ULCERS  *URINARY PROBLEMS  *BOWEL PROBLEMS  UNUSUAL RASH Items with * indicate a potential emergency and should be followed up as soon as possible.  Feel free to call the clinic you have any questions or concerns. The clinic phone number is [phone].  Please show the CHEMO ALERT CARD at check-in to the Emergency Department and triage nurse.   

## 2016-04-24 NOTE — Progress Notes (Signed)
[facility]  [phone] [phone]     ID: ZLATA ALCAIDE DOB: [birth year]  MR#: [id number]  HYI#:[id number]  Patient Care Team: [name], MD as PCP - General (Family Medicine) Stark Klein, MD as Consulting Physician (General Surgery) Chauncey Cruel, MD as Consulting Physician (Oncology) Eppie Gibson, MD as Attending Physician (Radiation Oncology) Minette Headland, NP as Nurse Practitioner (Hematology and Oncology) Chauncey Cruel, MD OTHER MD:  CHIEF COMPLAINT:  Estrogen receptor positive breast cancer  CURRENT TREATMENT:  Adjuvant chemotherapy and anti-HER-2 immunotherapy  BREAST CANCER HISTORY: From the original intake note:  Aliene noted some itching on her breasts and since this was the symptom that preceded her grandmother's breast cancer she underwent baseline bilateral screening mammography at [facility] 01/10/2016. The breast density was category B. In the left breast central to the nipple there was a 1.7 cm irregular high density mass with pleomorphic calcifications. Accordingly the patient was brought back 01/14/2016 for left diagnostic mammography and ultrasonography. This confirmed a 0.8 cm irregular mass in the left breast at the 10:00 position. 0.5 cm from the nipple lateral and posterior to that there was a 0.2 cm cluster of amorphous calcifications with an associated density. Anterior to the index mass was a 0.5 cm cluster of amorphous calcifications. All 3 findings taken together 3 cm. Ultrasound of the left breast found a 1 cm irregular mass in the left breast at the 10:00 position retroareolarly. There were no other sonographic abnormalities and the left axilla was sonographically benign  On 01/15/2016 the patient underwent core needle biopsy of the left breast index lesion at 10:00, and this found invasive ductal carcinoma, E-cadherin positive, estrogen receptor 90% positive, progesterone receptor 40% positive, both with  moderate staining intensity, with an MIB-1 of 5%, but HER-2 amplified, with a signals ratio of 2.41 and the number per cell 6.50. One of the 2 areas of abnormal calcifications, the more posterior one measuring 0.2 cm, was also biopsied and showed only atypical ductal hyperplasia (S [id number] and [id number]).  Her subsequent history is as detailed below  INTERVAL HISTORY: Seona returns today for follow-up of her estrogen and HER-2 positive breast cancer. Today is day 1 cycle 3 of 12 weekly planned paclitaxel doses. It is also day 15 cycle 1 of trastuzumab which she receives every 21 days   REVIEW OF SYSTEMS: Raquel feels tired, and is only taking little walk's care and they are. Nevertheless she is still working pretty much full-time. She has managed to work through the constipation problem with stool softeners and also she is managing the reflux issue with Zantac once or twice daily as needed. Her port is working well. She has had no mouth sores. Importantly she reports no numbness or her finger patch her Topamax. A detailed review of systems today was otherwise stable.  PAST MEDICAL HISTORY: Past Medical History:  Diagnosis Date  . Anemia    as a child  . Anxiety   . Cholestasis   . Elevated liver enzymes    during pregnancy March 2015  . Family history of breast cancer   . Family history of prostate cancer   . GERD (gastroesophageal reflux disease)    one episode  . Headache   . Hypothyroidism   . Malignant neoplasm of upper-inner quadrant of left female breast ([place]) 01/16/2016    PAST SURGICAL HISTORY: Past Surgical History:  Procedure Laterality Date  . BREAST LUMPECTOMY WITH RADIOACTIVE SEED AND SENTINEL LYMPH NODE BIOPSY  Left 03/13/2016   Procedure: LEFT BREAST LUMPECTOMY WITH RADIOACTIVE SEED X 2 AND SENTINEL LYMPH NODE BIOPSY;  Surgeon: Stark Klein, MD;  Location: [facility];  Service: General;  Laterality: Left;  . NO PAST SURGERIES    . PORTACATH PLACEMENT Right 03/13/2016    Procedure: INSERTION PORT-A-CATH;  Surgeon: Stark Klein, MD;  Location: [facility];  Service: General;  Laterality: Right;    FAMILY HISTORY Family History  Problem Relation Age of Onset  . Hypertension Mother   . Hypertension Father   . Prostate cancer Father   . Liver cancer Maternal Grandmother   . Breast cancer Paternal Grandmother     dx in her 38s  . Lung cancer Paternal Aunt     smoker  . Heart attack Paternal Grandfather   . Liver cancer Other   The patient's parents are alive, in their mid 38s as of November 2017. The patient father was diagnosed with prostate cancer at age 38. A paternal aunt was diagnosed with lung cancer in her late 38s. Also the paternal grandmother was diagnosed with breast cancer at the age of 38. On the mother's side there are 2 cases of liver cancer, age of diagnosis unknown.  GYNECOLOGIC HISTORY:  No LMP recorded.  Menarche age 36, first live birth age 36, she is [place] P2. She still having periods, most recently 01/21/2016, but they are no longer regular. She remotely used a number ring and oral contraceptives but stopped in 2016.Currently there use barrier methods for contraception.   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 Her husband [name] works as a Printmaker for [employer] (for example he worked on our (). Their children are [name] and [name],, aged 38 and 2 as of November 2017. Attention and is not a church attender    ADVANCED DIRECTIVES: Not in place   HEALTH MAINTENANCE: Social History  Substance Use Topics  . Smoking status: Never Smoker  . Smokeless tobacco: Never Used  . Alcohol use Yes     Comment: occasional     Colonoscopy:  PAP:2016  Bone density:   Allergies  Allergen Reactions  . Amoxicillin Diarrhea and Nausea Only    Has patient had a PCN reaction causing immediate rash, facial/tongue/throat swelling, SOB or lightheadedness with hypotension: No Has  patient had a PCN reaction causing severe rash involving mucus membranes or skin necrosis: No Has patient had a PCN reaction that required hospitalization No Has patient had a PCN reaction occurring within the last 10 years: Yes If all of the above answers are "NO", then may proceed with Cephalosporin use.     Current Outpatient Prescriptions  Medication Sig Dispense Refill  . acetaminophen (TYLENOL) 500 MG tablet Take 500-1,000 mg by mouth every 6 (six) hours as needed (for headaches.).    Marland Kitchen fluticasone (FLONASE) 50 MCG/ACT nasal spray Place 1-2 sprays into both nostrils daily as needed for allergies or rhinitis.    Marland Kitchen ibuprofen (ADVIL,MOTRIN) 200 MG tablet Take 400 mg by mouth every 8 (eight) hours as needed (for pain.).    Marland Kitchen lidocaine-prilocaine (EMLA) cream Apply to affected area once 30 g 3  . Multiple Vitamins-Minerals (MULTIVITAMIN GUMMIES ADULT PO) Take 2 tablets by mouth daily.    . ondansetron (ZOFRAN) 8 MG tablet Take 1 tablet (8 mg total) by mouth 2 (two) times daily as needed (Nausea or vomiting). 30 tablet 1  . prochlorperazine (COMPAZINE) 10 MG tablet Take 1 tablet (  10 mg total) by mouth every 6 (six) hours as needed (Nausea or vomiting). 30 tablet 1   No current facility-administered medications for this visit.     OBJECTIVE: Young-appearing woman In no acute distress Vitals:   04/24/16 0819  BP: 119/75  Pulse: 70  Resp: 18  Temp: 98.3 F (36.8 C)     Body mass index is 30.15 kg/m.    ECOG FS:1 - Symptomatic but completely ambulatory   Sclerae unicteric, EOMs intact Oropharynx clear and moist No cervical or supraclavicular adenopathy Lungs no rales or rhonchi Heart regular rate and rhythm Abd soft, nontender, positive bowel sounds MSK no focal spinal tenderness, no upper extremity lymphedema Neuro: nonfocal, well oriented, appropriate affect Breasts: The right breast is unremarkable. There is a minimal area of thickening in the superior aspect of the left breast,  without a well-defined mass. There is no evidence of local recurrence. Both axillae are benign.   LAB RESULTS:  CMP     Component Value Date/Time   NA 140 04/17/2016 1024   K 4.0 04/17/2016 1024   CL 107 03/07/2016 1612   CO2 25 04/17/2016 1024   GLUCOSE 94 04/17/2016 1024   BUN 9.9 04/17/2016 1024   CREATININE 0.8 04/17/2016 1024   CALCIUM 9.3 04/17/2016 1024   PROT 6.8 04/17/2016 1024   ALBUMIN 3.8 04/17/2016 1024   AST 28 04/17/2016 1024   ALT 65 (H) 04/17/2016 1024   ALKPHOS 65 04/17/2016 1024   BILITOT 0.22 04/17/2016 1024   GFRNONAA >60 03/07/2016 1612   GFRAA >60 03/07/2016 1612    INo results found for: SPEP, UPEP  Lab Results  Component Value Date   WBC 4.0 04/24/2016   NEUTROABS 1.9 04/24/2016   HGB 10.6 (L) 04/24/2016   HCT 33.3 (L) 04/24/2016   MCV 86.5 04/24/2016   PLT 236 04/24/2016      Chemistry      Component Value Date/Time   NA 140 04/17/2016 1024   K 4.0 04/17/2016 1024   CL 107 03/07/2016 1612   CO2 25 04/17/2016 1024   BUN 9.9 04/17/2016 1024   CREATININE 0.8 04/17/2016 1024      Component Value Date/Time   CALCIUM 9.3 04/17/2016 1024   ALKPHOS 65 04/17/2016 1024   AST 28 04/17/2016 1024   ALT 65 (H) 04/17/2016 1024   BILITOT 0.22 04/17/2016 1024       No results found for: LABCA2  No components found for: [id number]  No results for input(s): INR in the last 168 hours.  Urinalysis    Component Value Date/Time   COLORURINE YELLOW 10/14/2012 1954   APPEARANCEUR CLEAR 10/14/2012 1954   LABSPEC >1.030 (H) 10/14/2012 1954   PHURINE 6.0 10/14/2012 1954   GLUCOSEU NEGATIVE 10/14/2012 1954   HGBUR TRACE (A) 10/14/2012 1954   BILIRUBINUR NEGATIVE 10/14/2012 1954   KETONESUR 15 (A) 10/14/2012 1954   PROTEINUR NEGATIVE 10/14/2012 1954   UROBILINOGEN 0.2 10/14/2012 1954   NITRITE NEGATIVE 10/14/2012 1954   LEUKOCYTESUR NEGATIVE 10/14/2012 1954     STUDIES: Dg Chest 1 View  Result Date: 04/10/2016 CLINICAL DATA:  Left breast  cancer.  Port-A-Cath placement. EXAM: CHEST 1 VIEW COMPARISON:  07/16/2014 FINDINGS: Right subclavian Port-A-Cath has been placed with the tip in the SVC. No pneumothorax. Surgical clips in the left breast and axilla. Heart and mediastinal contours are within normal limits. No focal opacities or effusions. No acute bony abnormality. IMPRESSION: Right Port-A-Cath tip in the SVC.  No pneumothorax. Electronically Signed  By: Rolm Baptise M.D.   On: 04/10/2016 11:03     ELIGIBLE FOR AVAILABLE RESEARCH PROTOCOL: no  ASSESSMENT: 36 y.o. [place] woman status post left breast upper inner quadrant biopsy 01/15/2016 for a clinical T1c N0, stage IA invasive ductal carcinoma, E-cadherin positive, estrogen receptor 90% positive, progesterone receptor 40% positive, both with moderate staining intensity, with an MIB-1 of 5%, but HER-2 amplified, with a signals ratio of 2.41 and the number per cell 6.50.  (a) One of 2 areas of abnormal calcifications, the more posterior one measuring 0.2 cm, was also biopsied and showed only atypical ductal hyperplasia  (b) a 4.1 cm area of non-masslike enhancement in the left breast is scheduled for biopsy 02/19/2016  (1) tamoxifen started 01/23/2016 neoadjuvantly, pending a definitive surgical decision  (2) genetics testing 02/22/2016 through the Breast/Ovarian gene panel offered by [facility] found no deleterious mutations in  ATM, BARD1, BRCA1, BRCA2, BRIP1, CDH1, CHEK2, EPCAM, FANCC, MLH1, MSH2, MSH6, NBN, PALB2, PMS2, PTEN, RAD51C, RAD51D, TP53, and XRCC2.     (3) status post left lumpectomy and sentinel lymph node sampling 03/13/2016 for an mpT1c pN0, stage IA invasive ductal carcinoma, grade 2, with negative margins.  (4) paclitaxel weekly 12 and trastuzumab every 21 days beginning on 04/10/2016.  (5) trastuzumab to be continued to complete a year  (a) echocardiogram 02/11/2016 shows ejection fraction in the 60-65% range.  (6) adjuvant radiation to follow  chemotherapy  (7) anti-estrogens to resume at the completion of local treatment, to be continued for 5-10 years  PLAN: Jenniefer is tolerating the paclitaxel remarkably well and so far there is no evidence of peripheral neuropathy. She is proceeding with the third dose today.  Next week she will receive in addition the trastuzumab. She generally tolerates that well. She does not have a scheduled visit that day so I have reminded her that if she develops any numbness or tingling in her finger pads or topaz she needs to alert [place] and either my physician's extender or my partner will evaluate her before treatment.  She is managing her reflux and constipation well and I encouraged her to continue to take the appropriate supportive medicines as before.  Otherwise she will return to see me on 05/08/2016 with her fifth dose of paclitaxel  She knows to call for any problems that may develop for the next visit here. Chauncey Cruel, MD   04/24/2016 8:23 AM Medical Oncology and Hematology [facility] [address] [place], [place] [address] Tel. [phone]    Fax. [phone]

## 2016-04-28 ENCOUNTER — Telehealth (HOSPITAL_COMMUNITY): Payer: Self-pay | Admitting: Vascular Surgery

## 2016-04-28 NOTE — Telephone Encounter (Signed)
Left pt message to make NP brst appt w/ echo 

## 2016-05-01 ENCOUNTER — Other Ambulatory Visit (HOSPITAL_BASED_OUTPATIENT_CLINIC_OR_DEPARTMENT_OTHER): Payer: PRIVATE HEALTH INSURANCE

## 2016-05-01 ENCOUNTER — Ambulatory Visit (HOSPITAL_BASED_OUTPATIENT_CLINIC_OR_DEPARTMENT_OTHER): Payer: PRIVATE HEALTH INSURANCE

## 2016-05-01 VITALS — BP 117/84 | HR 72 | Temp 98.0°F | Resp 16

## 2016-05-01 DIAGNOSIS — C50212 Malignant neoplasm of upper-inner quadrant of left female breast: Secondary | ICD-10-CM | POA: Diagnosis not present

## 2016-05-01 DIAGNOSIS — Z5112 Encounter for antineoplastic immunotherapy: Secondary | ICD-10-CM | POA: Diagnosis not present

## 2016-05-01 DIAGNOSIS — Z170 Estrogen receptor positive status [ER+]: Secondary | ICD-10-CM

## 2016-05-01 DIAGNOSIS — Z5111 Encounter for antineoplastic chemotherapy: Secondary | ICD-10-CM

## 2016-05-01 LAB — CBC WITH DIFFERENTIAL/PLATELET
BASO%: 0.8 % (ref 0.0–2.0)
Basophils Absolute: 0 10*3/uL (ref 0.0–0.1)
EOS%: 2.4 % (ref 0.0–7.0)
Eosinophils Absolute: 0.1 10*3/uL (ref 0.0–0.5)
HCT: 33.2 % — ABNORMAL LOW (ref 34.8–46.6)
HGB: 10.4 g/dL — ABNORMAL LOW (ref 11.6–15.9)
LYMPH%: 51.2 % — ABNORMAL HIGH (ref 14.0–49.7)
MCH: 27.2 pg (ref 25.1–34.0)
MCHC: 31.3 g/dL — ABNORMAL LOW (ref 31.5–36.0)
MCV: 86.9 fL (ref 79.5–101.0)
MONO#: 0.3 10*3/uL (ref 0.1–0.9)
MONO%: 7 % (ref 0.0–14.0)
NEUT#: 1.4 10*3/uL — ABNORMAL LOW (ref 1.5–6.5)
NEUT%: 38.6 % (ref 38.4–76.8)
Platelets: 235 10*3/uL (ref 145–400)
RBC: 3.82 10*6/uL (ref 3.70–5.45)
RDW: 13.7 % (ref 11.2–14.5)
WBC: 3.7 10*3/uL — ABNORMAL LOW (ref 3.9–10.3)
lymph#: 1.9 10*3/uL (ref 0.9–3.3)

## 2016-05-01 LAB — COMPREHENSIVE METABOLIC PANEL
ALT: 39 U/L (ref 0–55)
AST: 19 U/L (ref 5–34)
Albumin: 3.8 g/dL (ref 3.5–5.0)
Alkaline Phosphatase: 76 U/L (ref 40–150)
Anion Gap: 7 mEq/L (ref 3–11)
BUN: 8.2 mg/dL (ref 7.0–26.0)
CO2: 27 mEq/L (ref 22–29)
Calcium: 9.6 mg/dL (ref 8.4–10.4)
Chloride: 106 mEq/L (ref 98–109)
Creatinine: 0.8 mg/dL (ref 0.6–1.1)
EGFR: >90 mL/min/{1.73_m2} (ref >90)
Glucose: 120 mg/dl (ref 70–140)
Potassium: 4.3 mEq/L (ref 3.5–5.1)
Sodium: 140 mEq/L (ref 136–145)
Total Bilirubin: <0.22 mg/dL (ref 0.20–1.20)
Total Protein: 7 g/dL (ref 6.4–8.3)

## 2016-05-01 MED ORDER — SODIUM CHLORIDE 0.9 % IV SOLN
Freq: Once | INTRAVENOUS | Status: AC
  Administered 2016-05-01: 15:00:00 via INTRAVENOUS

## 2016-05-01 MED ORDER — AZITHROMYCIN 250 MG PO TABS: ORAL_TABLET | ORAL | 0 refills | Status: DC

## 2016-05-01 MED ORDER — SODIUM CHLORIDE 0.9% FLUSH
10.0000 mL | INTRAVENOUS | Status: DC | PRN
  Administered 2016-05-01: 10 mL
  Filled 2016-05-01: qty 10

## 2016-05-01 MED ORDER — PACLITAXEL CHEMO INJECTION 300 MG/50ML
80.0000 mg/m2 | Freq: Once | INTRAVENOUS | Status: AC
  Administered 2016-05-01: 162 mg via INTRAVENOUS
  Filled 2016-05-01: qty 27

## 2016-05-01 MED ORDER — HEPARIN SOD (PORK) LOCK FLUSH 100 UNIT/ML IV SOLN
500.0000 [IU] | Freq: Once | INTRAVENOUS | Status: AC | PRN
  Administered 2016-05-01: 500 [IU]
  Filled 2016-05-01: qty 5

## 2016-05-01 MED ORDER — DIPHENHYDRAMINE HCL 25 MG PO CAPS: 25.0000 mg | ORAL_CAPSULE | Freq: Once | ORAL | Status: DC

## 2016-05-01 MED ORDER — ACETAMINOPHEN 325 MG PO TABS
ORAL_TABLET | ORAL | Status: AC
  Filled 2016-05-01: qty 2

## 2016-05-01 MED ORDER — ACETAMINOPHEN 325 MG PO TABS
650.0000 mg | ORAL_TABLET | Freq: Once | ORAL | Status: AC
  Administered 2016-05-01: 650 mg via ORAL

## 2016-05-01 MED ORDER — FAMOTIDINE IN NACL 20-0.9 MG/50ML-% IV SOLN
20.0000 mg | Freq: Once | INTRAVENOUS | Status: AC
  Administered 2016-05-01: 20 mg via INTRAVENOUS

## 2016-05-01 MED ORDER — DIPHENHYDRAMINE HCL 50 MG/ML IJ SOLN
25.0000 mg | Freq: Once | INTRAMUSCULAR | Status: AC
  Administered 2016-05-01: 25 mg via INTRAVENOUS

## 2016-05-01 MED ORDER — TRASTUZUMAB CHEMO 150 MG IV SOLR
6.0000 mg/kg | Freq: Once | INTRAVENOUS | Status: AC
  Administered 2016-05-01: 504 mg via INTRAVENOUS
  Filled 2016-05-01: qty 24

## 2016-05-01 MED ORDER — FAMOTIDINE IN NACL 20-0.9 MG/50ML-% IV SOLN
INTRAVENOUS | Status: AC
  Filled 2016-05-01: qty 50

## 2016-05-01 MED ORDER — DIPHENHYDRAMINE HCL 50 MG/ML IJ SOLN
INTRAMUSCULAR | Status: AC
  Filled 2016-05-01: qty 1

## 2016-05-01 NOTE — Patient Instructions (Addendum)
[facility] Discharge Instructions for Patients Receiving Chemotherapy  Today you received the following chemotherapy agents:  Herceptin and Taxol.  To help prevent nausea and vomiting after your treatment, we encourage you to take your nausea medication as directed.   If you develop nausea and vomiting that is not controlled by your nausea medication, call the clinic.   BELOW ARE SYMPTOMS THAT SHOULD BE REPORTED IMMEDIATELY:  *FEVER GREATER THAN 100.5 F  *CHILLS WITH OR WITHOUT FEVER  NAUSEA AND VOMITING THAT IS NOT CONTROLLED WITH YOUR NAUSEA MEDICATION  *UNUSUAL SHORTNESS OF BREATH  *UNUSUAL BRUISING OR BLEEDING  TENDERNESS IN MOUTH AND THROAT WITH OR WITHOUT PRESENCE OF ULCERS  *URINARY PROBLEMS  *BOWEL PROBLEMS  UNUSUAL RASH Items with * indicate a potential emergency and should be followed up as soon as possible.  Feel free to call the clinic you have any questions or concerns. The clinic phone number is [phone].  Please show the [facility] at check-in to the Emergency Department and triage nurse.   Neutropenia Neutropenia is a condition that occurs when you have a lower-than-normal level of a type of white blood cell (neutrophil) in your body. Neutrophils are made in the spongy center of large bones (bone marrow) and they fight infections. Neutrophils are your body's main defense against bacterial and fungal infections. The fewer neutrophils you have and the longer your body remains without them, the greater your risk of getting a severe infection. What are the causes? This condition can occur if your body uses up or destroys neutrophils faster than your bone marrow can make them. This problem may happen because of:  Bacterial or fungal infection.  Allergic disorders.  Reactions to some medicines.  Autoimmune disease.  An enlarged spleen. This condition can also occur if your bone marrow does not produce enough neutrophils. This  problem may be caused by:  Cancer.  Cancer treatments, such as radiation or chemotherapy.  Viral infections.  Medicines, such as phenytoin.  Vitamin B12 deficiency.  Diseases of the bone marrow.  Environmental toxins, such as insecticides. What are the signs or symptoms? This condition does not usually cause symptoms. If symptoms are present, they are usually caused by an underlying infection. Symptoms of an infection may include:  Fever.  Chills.  Swollen glands.  Oral or anal ulcers.  Cough and shortness of breath.  Rash.  Skin infection.  Fatigue. How is this diagnosed? Your health care provider may suspect neutropenia if you have:  A condition that may cause neutropenia.  Symptoms of infection, especially fever.  Frequent and unusual infections. You will have a medical history and physical exam. Tests will also be done, such as:  A complete blood count (CBC).  A procedure to collect a sample of bone marrow for examination (bone marrow biopsy).  A chest X-ray.  A urine culture.  A blood culture. How is this treated? Treatment depends on the underlying cause and severity of your condition. Mild neutropenia may not require treatment. Treatment may include medicines, such as:  Antibiotic medicine given through an IV tube.  Antiviral medicines.  Antifungal medicines.  A medicine to increase neutrophil production (colony-stimulating factor). You may get this drug through an IV tube or by injection.  Steroids given through an IV tube. If an underlying condition is causing neutropenia, you may need treatment for that condition. If medicines you are taking are causing neutropenia, your health care provider may have you stop taking those medicines. Follow these instructions  at home: Medicines   Take over-the-counter and prescription medicines only as told by your health care provider.  Get a seasonal flu shot (influenza vaccine). Lifestyle   Do not  eat unpasteurized foods.Do not eat unwashed raw fruits or vegetables.  Avoid exposure to groups of people or children.  Avoid being around people who are sick.  Avoid being around dirt or dust, such as in construction areas or gardens.  Do not provide direct care for pets. Avoid animal droppings. Do not clean litter boxes and bird cages. Hygiene    Bathe daily.  Clean the area between the genitals and the anus (perineal area) after you urinate or have a bowel movement. If you are female, wipe from front to back.  Brush your teeth with a soft toothbrush before and after meals.  Do not use a razor that has a blade. Use an electric razor to remove hair.  Wash your hands often. Make sure others who come in contact with you also wash their hands. If soap and water are not available, use hand sanitizer. General instructions   Do not have sex unless your health care provider has approved.  Take actions to avoid cuts and burns. For example:  Be cautious when you use knives. Always cut away from yourself.  Keep knives in protective sheaths or guards when not in use.  Use oven mitts when you cook with a hot stove, oven, or grill.  Stand a safe distance away from open fires.  Avoid people who received a vaccine in the past 30 days if that vaccine contained a live version of the germ (live vaccine). You should not get a live vaccine. Common live vaccines are varicella, measles, mumps, and rubella.  Do not share food utensils.  Do not use tampons, enemas, or rectal suppositories unless your health care provider has approved.  Keep all appointments as told by your health care provider. This is important. Contact a health care provider if:  You have a fever.  You have chills or you start to shake.  You have:  A sore throat.  A warm, red, or tender area on your skin.  A cough.  Frequent or painful urination.  Vaginal discharge or itching.  You develop:  Sores in your  mouth or anus.  Swollen lymph nodes.  Red streaks on the skin.  A rash.  You feel:  Nauseous or you vomit.  Very fatigued.  Short of breath. This information is not intended to replace advice given to you by your health care provider. Make sure you discuss any questions you have with your health care provider. Document Released: 08/02/2001 Document Revised: 07/19/2015 Document Reviewed: 08/23/2014 Elsevier Interactive Patient Education  2017 [employer].

## 2016-05-01 NOTE — Progress Notes (Signed)
Per Dr. Lindi Adie, ok to treat with current [place]. Will call-in Z-pack per Dr. Lindi Adie. Patient aware and agrees with plan.

## 2016-05-08 ENCOUNTER — Other Ambulatory Visit (HOSPITAL_BASED_OUTPATIENT_CLINIC_OR_DEPARTMENT_OTHER): Payer: PRIVATE HEALTH INSURANCE

## 2016-05-08 ENCOUNTER — Ambulatory Visit (HOSPITAL_BASED_OUTPATIENT_CLINIC_OR_DEPARTMENT_OTHER): Payer: PRIVATE HEALTH INSURANCE

## 2016-05-08 ENCOUNTER — Ambulatory Visit (HOSPITAL_BASED_OUTPATIENT_CLINIC_OR_DEPARTMENT_OTHER): Payer: PRIVATE HEALTH INSURANCE | Admitting: Oncology

## 2016-05-08 VITALS — BP 129/79 | HR 68 | Temp 97.8°F | Resp 18 | Ht 67.0 in | Wt 196.6 lb

## 2016-05-08 DIAGNOSIS — Z5111 Encounter for antineoplastic chemotherapy: Secondary | ICD-10-CM | POA: Diagnosis not present

## 2016-05-08 DIAGNOSIS — Z170 Estrogen receptor positive status [ER+]: Secondary | ICD-10-CM

## 2016-05-08 DIAGNOSIS — C50212 Malignant neoplasm of upper-inner quadrant of left female breast: Secondary | ICD-10-CM

## 2016-05-08 LAB — CBC WITH DIFFERENTIAL/PLATELET
BASO%: 0.7 % (ref 0.0–2.0)
Basophils Absolute: 0 10*3/uL (ref 0.0–0.1)
EOS%: 2.2 % (ref 0.0–7.0)
Eosinophils Absolute: 0.1 10*3/uL (ref 0.0–0.5)
HCT: 32.9 % — ABNORMAL LOW (ref 34.8–46.6)
HGB: 10.6 g/dL — ABNORMAL LOW (ref 11.6–15.9)
LYMPH%: 46.6 % (ref 14.0–49.7)
MCH: 27.7 pg (ref 25.1–34.0)
MCHC: 32.2 g/dL (ref 31.5–36.0)
MCV: 86.1 fL (ref 79.5–101.0)
MONO#: 0.4 10*3/uL (ref 0.1–0.9)
MONO%: 9.1 % (ref 0.0–14.0)
NEUT#: 1.7 10*3/uL (ref 1.5–6.5)
NEUT%: 41.4 % (ref 38.4–76.8)
Platelets: 233 10*3/uL (ref 145–400)
RBC: 3.82 10*6/uL (ref 3.70–5.45)
RDW: 13.6 % (ref 11.2–14.5)
WBC: 4.1 10*3/uL (ref 3.9–10.3)
lymph#: 1.9 10*3/uL (ref 0.9–3.3)

## 2016-05-08 LAB — COMPREHENSIVE METABOLIC PANEL
ALT: 34 U/L (ref 0–55)
AST: 22 U/L (ref 5–34)
Albumin: 3.9 g/dL (ref 3.5–5.0)
Alkaline Phosphatase: 67 U/L (ref 40–150)
Anion Gap: 8 mEq/L (ref 3–11)
BUN: 7.7 mg/dL (ref 7.0–26.0)
CO2: 25 mEq/L (ref 22–29)
Calcium: 9.5 mg/dL (ref 8.4–10.4)
Chloride: 107 mEq/L (ref 98–109)
Creatinine: 0.8 mg/dL (ref 0.6–1.1)
EGFR: >90 mL/min/{1.73_m2} (ref >90)
Glucose: 85 mg/dl (ref 70–140)
Potassium: 4.1 mEq/L (ref 3.5–5.1)
Sodium: 141 mEq/L (ref 136–145)
Total Bilirubin: <0.22 mg/dL (ref 0.20–1.20)
Total Protein: 7.1 g/dL (ref 6.4–8.3)

## 2016-05-08 MED ORDER — HEPARIN SOD (PORK) LOCK FLUSH 100 UNIT/ML IV SOLN
500.0000 [IU] | Freq: Once | INTRAVENOUS | Status: AC | PRN
  Administered 2016-05-08: 500 [IU]
  Filled 2016-05-08: qty 5

## 2016-05-08 MED ORDER — FAMOTIDINE IN NACL 20-0.9 MG/50ML-% IV SOLN
INTRAVENOUS | Status: AC
  Filled 2016-05-08: qty 50

## 2016-05-08 MED ORDER — LORAZEPAM 0.5 MG PO TABS: 0.5000 mg | ORAL_TABLET | Freq: Every evening | ORAL | 1 refills | Status: DC | PRN

## 2016-05-08 MED ORDER — DIPHENHYDRAMINE HCL 50 MG/ML IJ SOLN
25.0000 mg | Freq: Once | INTRAMUSCULAR | Status: AC
  Administered 2016-05-08: 25 mg via INTRAVENOUS

## 2016-05-08 MED ORDER — DEXAMETHASONE SODIUM PHOSPHATE 10 MG/ML IJ SOLN
4.0000 mg | Freq: Once | INTRAMUSCULAR | Status: AC
  Administered 2016-05-08: 4 mg via INTRAVENOUS

## 2016-05-08 MED ORDER — DEXAMETHASONE SODIUM PHOSPHATE 10 MG/ML IJ SOLN
INTRAMUSCULAR | Status: AC
  Filled 2016-05-08: qty 1

## 2016-05-08 MED ORDER — SODIUM CHLORIDE 0.9% FLUSH
10.0000 mL | INTRAVENOUS | Status: DC | PRN
  Administered 2016-05-08: 10 mL
  Filled 2016-05-08: qty 10

## 2016-05-08 MED ORDER — SODIUM CHLORIDE 0.9 % IV SOLN
Freq: Once | INTRAVENOUS | Status: AC
  Administered 2016-05-08: 15:00:00 via INTRAVENOUS

## 2016-05-08 MED ORDER — FAMOTIDINE IN NACL 20-0.9 MG/50ML-% IV SOLN
20.0000 mg | Freq: Once | INTRAVENOUS | Status: AC
  Administered 2016-05-08: 20 mg via INTRAVENOUS

## 2016-05-08 MED ORDER — PACLITAXEL CHEMO INJECTION 300 MG/50ML
80.0000 mg/m2 | Freq: Once | INTRAVENOUS | Status: AC
  Administered 2016-05-08: 162 mg via INTRAVENOUS
  Filled 2016-05-08: qty 27

## 2016-05-08 MED ORDER — DIPHENHYDRAMINE HCL 50 MG/ML IJ SOLN
INTRAMUSCULAR | Status: AC
  Filled 2016-05-08: qty 1

## 2016-05-08 NOTE — Patient Instructions (Signed)
[facility] Discharge Instructions for Patients Receiving Chemotherapy  Today you received the following chemotherapy agents Taxol   To help prevent nausea and vomiting after your treatment, we encourage you to take your nausea medication as directed.   If you develop nausea and vomiting that is not controlled by your nausea medication, call the clinic.   BELOW ARE SYMPTOMS THAT SHOULD BE REPORTED IMMEDIATELY:  *FEVER GREATER THAN 100.5 F  *CHILLS WITH OR WITHOUT FEVER  NAUSEA AND VOMITING THAT IS NOT CONTROLLED WITH YOUR NAUSEA MEDICATION  *UNUSUAL SHORTNESS OF BREATH  *UNUSUAL BRUISING OR BLEEDING  TENDERNESS IN MOUTH AND THROAT WITH OR WITHOUT PRESENCE OF ULCERS  *URINARY PROBLEMS  *BOWEL PROBLEMS  UNUSUAL RASH Items with * indicate a potential emergency and should be followed up as soon as possible.  Feel free to call the clinic you have any questions or concerns. The clinic phone number is [phone].  Please show the CHEMO ALERT CARD at check-in to the Emergency Department and triage nurse.   

## 2016-05-08 NOTE — Progress Notes (Signed)
Virginia [name]  [phone]) [phone] [phone]     ID: Virginia [name] DOB: [birth year]  MR#: [id number]  DJM#:[id number]  Patient Care Team: [name], MD as PCP - General (Family Medicine) Stark Klein, MD as Consulting Physician (General Surgery) Chauncey Cruel, MD as Consulting Physician (Oncology) Eppie Gibson, MD as Attending Physician (Radiation Oncology) Gardenia Phlegm, NP as Nurse Practitioner (Hematology and Oncology) Chauncey Cruel, MD OTHER MD:  CHIEF COMPLAINT:  Estrogen receptor positive breast cancer  CURRENT TREATMENT:  Adjuvant chemotherapy and anti-HER-2 immunotherapy  BREAST CANCER HISTORY: From the original intake note:  Virginia [name] noted some itching on her breasts and since this was the symptom that preceded her grandmother's breast cancer she underwent baseline bilateral screening mammography at [facility] 01/10/2016. The breast density was category B. In the left breast central to the nipple there was a 1.7 cm irregular high density mass with pleomorphic calcifications. Accordingly the patient was brought back 01/14/2016 for left diagnostic mammography and ultrasonography. This confirmed a 0.8 cm irregular mass in the left breast at the 10:00 position. 0.5 cm from the nipple lateral and posterior to that there was a 0.2 cm cluster of amorphous calcifications with an associated density. Anterior to the index mass was a 0.5 cm cluster of amorphous calcifications. All 3 findings taken together 3 cm. Ultrasound of the left breast found a 1 cm irregular mass in the left breast at the 10:00 position retroareolarly. There were no other sonographic abnormalities and the left axilla was sonographically benign  On 01/15/2016 the patient underwent core needle biopsy of the left breast index lesion at 10:00, and this found invasive ductal carcinoma, E-cadherin positive, estrogen receptor 90% positive, progesterone receptor 40% positive, both with  moderate staining intensity, with an MIB-1 of 5%, but HER-2 amplified, with a signals ratio of 2.41 and the number per cell 6.50. One of the 2 areas of abnormal calcifications, the more posterior one measuring 0.2 cm, was also biopsied and showed only atypical ductal hyperplasia (S [id number]).  Her subsequent history is as detailed below  INTERVAL HISTORY: Virginia [name] returns today for follow-up and treatment of her estrogen receptor positive breast cancer. She is due for her fifth dose of paclitaxel today, off 12 doses planned. She also receives trastuzumab with her next dose due with the eighth paclitaxel dose   REVIEW OF SYSTEMS: Virginia [name] has started taking walks, up to 30 minutes at a time, and feels good doing this, but then feels very fatigued. She is not sleeping. She gets to bed about 1 AM and gets out of bed about 6 AM, with interruptions. She has tried Benadryl but it has not worked. Her hair was coming out and so she had her head shaved. This was somewhat depressing but she is dealing with it. She has a way that she consider as effective, but is itchy. She has mild epistaxis particularly in the morning when she blows her nose. She had fixed or constipation by taking 2 cholestasis daily but then she back off on that and the constipation is back. She has absolutely no peripheral neuropathy symptoms. A detailed review of systems today was otherwise noncontributory  PAST MEDICAL HISTORY: Past Medical History:  Diagnosis Date  . Anemia    as a child  . Anxiety   . Cholestasis   . Elevated liver enzymes    during pregnancy March 2015  . Family history of breast cancer   . Family history of prostate  cancer   . GERD (gastroesophageal reflux disease)    one episode  . Headache   . Hypothyroidism   . Malignant neoplasm of upper-inner quadrant of left female breast ([place]) 01/16/2016    PAST SURGICAL HISTORY: Past Surgical History:  Procedure Laterality Date  . BREAST LUMPECTOMY  WITH RADIOACTIVE SEED AND SENTINEL LYMPH NODE BIOPSY Left 03/13/2016   Procedure: LEFT BREAST LUMPECTOMY WITH RADIOACTIVE SEED X 2 AND SENTINEL LYMPH NODE BIOPSY;  Surgeon: Stark Klein, MD;  Location: [facility];  Service: General;  Laterality: Left;  . NO PAST SURGERIES    . PORTACATH PLACEMENT Right 03/13/2016   Procedure: INSERTION PORT-A-CATH;  Surgeon: Stark Klein, MD;  Location: [facility];  Service: General;  Laterality: Right;    FAMILY HISTORY Family History  Problem Relation Age of Onset  . Hypertension Mother   . Hypertension Father   . Prostate cancer Father   . Liver cancer Maternal Grandmother   . Breast cancer Paternal Grandmother     dx in her 44s  . Lung cancer Paternal Aunt     smoker  . Heart attack Paternal Grandfather   . Liver cancer Other   The patient's parents are alive, in their mid 36s as of November 2017. The patient father was diagnosed with prostate cancer at age 76. A paternal aunt was diagnosed with lung cancer in her late 67s. Also the paternal grandmother was diagnosed with breast cancer at the age of 52. On the mother's side there are 2 cases of liver cancer, age of diagnosis unknown.  GYNECOLOGIC HISTORY:  No LMP recorded.  Menarche age 6, first live birth age 58, she is [place] P2. She still having periods, most recently 01/21/2016, but they are no longer regular. She remotely used a number ring and oral contraceptives but stopped in 2016.Currently there use barrier methods for contraception.   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 Her husband Virginia [name] works as a Printmaker for [employer] (for example he worked on our (). Their children are Virginia [name] and Virginia [name],, aged 60 and 2 as of November 2017. Attention and is not a church attender    ADVANCED DIRECTIVES: Not in place   HEALTH MAINTENANCE: Social History  Substance Use Topics  . Smoking status: Never Smoker  . Smokeless tobacco: Never  Used  . Alcohol use Yes     Comment: occasional     Colonoscopy:  PAP:2016  Bone density:   Allergies  Allergen Reactions  . Amoxicillin Diarrhea and Nausea Only    Has patient had a PCN reaction causing immediate rash, facial/tongue/throat swelling, SOB or lightheadedness with hypotension: No Has patient had a PCN reaction causing severe rash involving mucus membranes or skin necrosis: No Has patient had a PCN reaction that required hospitalization No Has patient had a PCN reaction occurring within the last 10 years: Yes If all of the above answers are "NO", then may proceed with Cephalosporin use.     Current Outpatient Prescriptions  Medication Sig Dispense Refill  . acetaminophen (TYLENOL) 500 MG tablet Take 500-1,000 mg by mouth every 6 (six) hours as needed (for headaches.).    Marland Kitchen azithromycin (ZITHROMAX Z-PAK) 250 MG tablet Take as directed 6 each 0  . fluticasone (FLONASE) 50 MCG/ACT nasal spray Place 1-2 sprays into both nostrils daily as needed for allergies or rhinitis.    Marland Kitchen ibuprofen (ADVIL,MOTRIN) 200 MG tablet Take 400 mg by mouth every 8 (eight)  hours as needed (for pain.).    Marland Kitchen lidocaine-prilocaine (EMLA) cream Apply to affected area once 30 g 3  . Multiple Vitamins-Minerals (MULTIVITAMIN GUMMIES ADULT PO) Take 2 tablets by mouth daily.    . ondansetron (ZOFRAN) 8 MG tablet Take 1 tablet (8 mg total) by mouth 2 (two) times daily as needed (Nausea or vomiting). 30 tablet 1  . prochlorperazine (COMPAZINE) 10 MG tablet Take 1 tablet (10 mg total) by mouth every 6 (six) hours as needed (Nausea or vomiting). 30 tablet 1   No current facility-administered medications for this visit.     OBJECTIVE: Young-appearing woman Who appears stated age  36:   05/08/16 0807  BP: 129/79  Pulse: 68  Resp: 18  Temp: 97.8 F (36.6 C)     Body mass index is 30.79 kg/m.    ECOG FS:1 - Symptomatic but completely ambulatory   Sclerae unicteric, pupils round and  equal Oropharynx clear and moist-- no thrush or other lesions No cervical or supraclavicular adenopathy Lungs no rales or rhonchi Heart regular rate and rhythm Abd soft, nontender, positive bowel sounds MSK no focal spinal tenderness, no upper extremity lymphedema Neuro: nonfocal, well oriented, appropriate affect Breasts: The right breast is benign. There is no palpable mass in the left breast. Both axillae are benign.  LAB RESULTS:  CMP     Component Value Date/Time   NA 140 05/01/2016 1411   K 4.3 05/01/2016 1411   CL 107 03/07/2016 1612   CO2 27 05/01/2016 1411   GLUCOSE 120 05/01/2016 1411   BUN 8.2 05/01/2016 1411   CREATININE 0.8 05/01/2016 1411   CALCIUM 9.6 05/01/2016 1411   PROT 7.0 05/01/2016 1411   ALBUMIN 3.8 05/01/2016 1411   AST 19 05/01/2016 1411   ALT 39 05/01/2016 1411   ALKPHOS 76 05/01/2016 1411   BILITOT <0.22 05/01/2016 1411   GFRNONAA >60 03/07/2016 1612   GFRAA >60 03/07/2016 1612    INo results found for: SPEP, UPEP  Lab Results  Component Value Date   WBC 3.7 (L) 05/01/2016   NEUTROABS 1.4 (L) 05/01/2016   HGB 10.4 (L) 05/01/2016   HCT 33.2 (L) 05/01/2016   MCV 86.9 05/01/2016   PLT 235 05/01/2016      Chemistry      Component Value Date/Time   NA 140 05/01/2016 1411   K 4.3 05/01/2016 1411   CL 107 03/07/2016 1612   CO2 27 05/01/2016 1411   BUN 8.2 05/01/2016 1411   CREATININE 0.8 05/01/2016 1411      Component Value Date/Time   CALCIUM 9.6 05/01/2016 1411   ALKPHOS 76 05/01/2016 1411   AST 19 05/01/2016 1411   ALT 39 05/01/2016 1411   BILITOT <0.22 05/01/2016 1411       No results found for: LABCA2  No components found for: LABCA125  No results for input(s): INR in the last 168 hours.  Urinalysis    Component Value Date/Time   COLORURINE YELLOW 10/14/2012 1954   APPEARANCEUR CLEAR 10/14/2012 1954   LABSPEC >1.030 (H) 10/14/2012 1954   PHURINE 6.0 10/14/2012 1954   GLUCOSEU NEGATIVE 10/14/2012 1954   HGBUR TRACE  (A) 10/14/2012 1954   BILIRUBINUR NEGATIVE 10/14/2012 1954   KETONESUR 15 (A) 10/14/2012 1954   PROTEINUR NEGATIVE 10/14/2012 1954   UROBILINOGEN 0.2 10/14/2012 1954   NITRITE NEGATIVE 10/14/2012 1954   LEUKOCYTESUR NEGATIVE 10/14/2012 1954     STUDIES: Dg Chest 1 View  Result Date: 04/10/2016 CLINICAL DATA:  Left breast  cancer.  Port-A-Cath placement. EXAM: CHEST 1 VIEW COMPARISON:  07/16/2014 FINDINGS: Right subclavian Port-A-Cath has been placed with the tip in the SVC. No pneumothorax. Surgical clips in the left breast and axilla. Heart and mediastinal contours are within normal limits. No focal opacities or effusions. No acute bony abnormality. IMPRESSION: Right Port-A-Cath tip in the SVC.  No pneumothorax. Electronically Signed   By: Rolm Baptise M.D.   On: 04/10/2016 11:03     ELIGIBLE FOR AVAILABLE RESEARCH PROTOCOL: no  ASSESSMENT: 36 y.o. [place] woman status post left breast upper inner quadrant biopsy 01/15/2016 for a clinical T1c N0, stage IA invasive ductal carcinoma, E-cadherin positive, estrogen receptor 90% positive, progesterone receptor 40% positive, both with moderate staining intensity, with an MIB-1 of 5%, but HER-2 amplified, with a signals ratio of 2.41 and the number per cell 6.50.  (a) One of 2 areas of abnormal calcifications, the more posterior one measuring 0.2 cm, was also biopsied and showed only atypical ductal hyperplasia  (b) a 4.1 cm area of non-masslike enhancement in the left breast is scheduled for biopsy 02/19/2016  (1) tamoxifen started 01/23/2016 neoadjuvantly, pending a definitive surgical decision  (2) genetics testing 02/22/2016 through the Breast/Ovarian gene panel offered by [facility] found no deleterious mutations in  ATM, BARD1, BRCA1, BRCA2, BRIP1, CDH1, CHEK2, EPCAM, FANCC, MLH1, MSH2, MSH6, NBN, PALB2, PMS2, PTEN, RAD51C, RAD51D, TP53, and XRCC2.     (3) status post left lumpectomy and sentinel lymph node sampling 03/13/2016 for an mpT1c  pN0, stage IA invasive ductal carcinoma, grade 2, with negative margins.  (4) paclitaxel weekly 12 and trastuzumab every 21 days beginning on 04/10/2016.  (5) trastuzumab to be continued to complete a year  (a) echocardiogram 02/11/2016 shows ejection fraction in the 60-65% range.  (6) adjuvant radiation to follow chemotherapy  (7) anti-estrogens to resume at the completion of local treatment, to be continued for 5-10 years  PLAN: [place] and 10 years to tolerate her treatments moderately well, and she will proceed to her fifth of 12 planned paclitaxel doses today.  I think the main reason for the fatigue, aside from mild depression, is a lack of sleep. We are adding lorazepam to her sleep toilet and hopefully this will be helpful.  The mild epistaxis of course is partly due to allergies but also to the Taxol. We discussed various maneuvers to be nice to her nose including nasal saline, but she will avoid blowing her nose in the morning  She needs to stay on 2 stool softeners daily if she wishes to avoid constipation.  Otherwise we are proceeding with treatment as planned. She will be treated next week without a visit so she will let us know if any peripheral neuropathy develops. Otherwise I will see her again in 2 weeks with cycle #7. She knows to call for any other problems that may develop before then.Marland Kitchen Chauncey Cruel, MD   05/08/2016 8:18 AM Medical Oncology and Hematology [facility] [address] [place], [place] [address] Tel. [phone]    Fax. [phone]

## 2016-05-12 ENCOUNTER — Telehealth: Payer: PRIVATE HEALTH INSURANCE | Admitting: Family

## 2016-05-12 DIAGNOSIS — T7840XA Allergy, unspecified, initial encounter: Secondary | ICD-10-CM

## 2016-05-12 NOTE — Progress Notes (Signed)
E visit for Allergic Rhinitis We are sorry that you are not feeling well.  Her is how we plan to help!  Based on what you have shared with me it looks like you have Allergic Rhinitis.  Rhinitis is when a reaction occurs that causes nasal congestion, runny nose, sneezing, and itching.  Most types of rhinitis are caused by an inflammation and are associated with symptoms in the eyes ears or throat. There are several types of rhinitis.  The most common are acute rhinitis, which is usually caused by a viral illness, allergic or seasonal rhinitis, and nonallergic or year-round rhinitis.  Nasal allergies occur certain times of the year.  Allergic rhinitis is caused when allergens in the air trigger the release of histamine in the body.  Histamine causes itching, swelling, and fluid to build up in the fragile linings of the nasal passages, sinuses and eyelids.  An itchy nose and clear discharge are common.  I recommend the following over the counter treatments: Allegra 60 mg twice daily  As discussed over the phone, you can take Benadryl as needed. You need to figure out the allergen. If this continues you will need to follow up with your PCP.  HOME CARE:   You can use an over-the-counter saline nasal spray as needed  Avoid areas where there is heavy dust, mites, or molds  Stay indoors on windy days during the pollen season  Keep windows closed in home, at least in bedroom; use air conditioner.  Use high-efficiency house air filter  Keep windows closed in car, turn AC on re-circulate  Avoid playing out with dog during pollen season  GET HELP RIGHT AWAY IF:   If your symptoms do not improve within 10 days  You become short of breath  You develop yellow or green discharge from your nose for over 3 days  You have coughing fits  MAKE SURE YOU:   Understand these instructions  Will watch your condition  Will get help right away if you are not doing well or get worse  Thank you for  choosing an e-visit. Your e-visit answers were reviewed by a board certified advanced clinical practitioner to complete your personal care plan. Depending upon the condition, your plan could have included both over the counter or prescription medications. Please review your pharmacy choice. Be sure that the pharmacy you have chosen is open so that you can pick up your prescription now.  If there is a problem you may message your provider in [place] to have the prescription routed to another pharmacy. Your safety is important to [place]. If you have drug allergies check your prescription carefully.  For the next 24 hours, you can use MyChart to ask questions about today's visit, request a non-urgent call back, or ask for a work or school excuse from your e-visit provider. You will get an email in the next two days asking about your experience. I hope that your e-visit has been valuable and will speed your recovery.

## 2016-05-13 ENCOUNTER — Other Ambulatory Visit: Payer: Self-pay | Admitting: Emergency Medicine

## 2016-05-13 ENCOUNTER — Telehealth: Payer: Self-pay | Admitting: Emergency Medicine

## 2016-05-13 MED ORDER — GABAPENTIN 300 MG PO CAPS: 300.0000 mg | ORAL_CAPSULE | Freq: Every day | ORAL | 2 refills | Status: DC

## 2016-05-13 NOTE — Telephone Encounter (Signed)
Patient called to report that she is having neuropathy in her fingers; states "feels like needles pricking her fingertips and burning to fingers. Patient complains that the pain is 10/10.  States that these symptoms are constant at this time. She has tried taking benadryl and allerga with some to minimal relief.   Returned patient's call; left message advising patient that this nurse will let Dr Jana Hakim know about her concerns. Advised her that we may be able to prescribe her something for this discomfort and also this may result in Dr Jana Hakim changing her treatment plan.   Spoke with Dr Jana Hakim; cancel chemo for this week and send patient gabapentin 300 @ hs. Left patient a message to inform her of this plan and to call for any concerns. Canceled appointments for 3/22 per Dr Jana Hakim.

## 2016-05-15 ENCOUNTER — Other Ambulatory Visit: Payer: PRIVATE HEALTH INSURANCE

## 2016-05-15 ENCOUNTER — Ambulatory Visit: Payer: PRIVATE HEALTH INSURANCE

## 2016-05-22 ENCOUNTER — Other Ambulatory Visit (HOSPITAL_BASED_OUTPATIENT_CLINIC_OR_DEPARTMENT_OTHER): Payer: PRIVATE HEALTH INSURANCE

## 2016-05-22 ENCOUNTER — Other Ambulatory Visit: Payer: PRIVATE HEALTH INSURANCE

## 2016-05-22 ENCOUNTER — Ambulatory Visit (HOSPITAL_BASED_OUTPATIENT_CLINIC_OR_DEPARTMENT_OTHER): Payer: PRIVATE HEALTH INSURANCE

## 2016-05-22 ENCOUNTER — Ambulatory Visit (HOSPITAL_BASED_OUTPATIENT_CLINIC_OR_DEPARTMENT_OTHER): Payer: PRIVATE HEALTH INSURANCE | Admitting: Oncology

## 2016-05-22 ENCOUNTER — Encounter: Payer: Self-pay | Admitting: *Deleted

## 2016-05-22 ENCOUNTER — Encounter: Payer: Self-pay | Admitting: Radiation Oncology

## 2016-05-22 VITALS — BP 133/87 | HR 72 | Temp 98.0°F | Resp 20 | Ht 67.0 in | Wt 197.5 lb

## 2016-05-22 DIAGNOSIS — Z170 Estrogen receptor positive status [ER+]: Secondary | ICD-10-CM | POA: Diagnosis not present

## 2016-05-22 DIAGNOSIS — Z5112 Encounter for antineoplastic immunotherapy: Secondary | ICD-10-CM | POA: Diagnosis not present

## 2016-05-22 DIAGNOSIS — C50212 Malignant neoplasm of upper-inner quadrant of left female breast: Secondary | ICD-10-CM

## 2016-05-22 LAB — CBC WITH DIFFERENTIAL/PLATELET
BASO%: 0.8 % (ref 0.0–2.0)
Basophils Absolute: 0 10*3/uL (ref 0.0–0.1)
EOS%: 2.8 % (ref 0.0–7.0)
Eosinophils Absolute: 0.2 10*3/uL (ref 0.0–0.5)
HCT: 35.5 % (ref 34.8–46.6)
HGB: 11.1 g/dL — ABNORMAL LOW (ref 11.6–15.9)
LYMPH%: 41.9 % (ref 14.0–49.7)
MCH: 27 pg (ref 25.1–34.0)
MCHC: 31.3 g/dL — ABNORMAL LOW (ref 31.5–36.0)
MCV: 86.4 fL (ref 79.5–101.0)
MONO#: 0.9 10*3/uL (ref 0.1–0.9)
MONO%: 16.5 % — ABNORMAL HIGH (ref 0.0–14.0)
NEUT#: 2 10*3/uL (ref 1.5–6.5)
NEUT%: 38 % — ABNORMAL LOW (ref 38.4–76.8)
Platelets: 235 10*3/uL (ref 145–400)
RBC: 4.11 10*6/uL (ref 3.70–5.45)
RDW: 13.8 % (ref 11.2–14.5)
WBC: 5.3 10*3/uL (ref 3.9–10.3)
lymph#: 2.2 10*3/uL (ref 0.9–3.3)

## 2016-05-22 LAB — COMPREHENSIVE METABOLIC PANEL
ALT: 21 U/L (ref 0–55)
AST: 18 U/L (ref 5–34)
Albumin: 4 g/dL (ref 3.5–5.0)
Alkaline Phosphatase: 66 U/L (ref 40–150)
Anion Gap: 9 mEq/L (ref 3–11)
BUN: 8.2 mg/dL (ref 7.0–26.0)
CO2: 25 mEq/L (ref 22–29)
Calcium: 9.3 mg/dL (ref 8.4–10.4)
Chloride: 107 mEq/L (ref 98–109)
Creatinine: 0.8 mg/dL (ref 0.6–1.1)
EGFR: >90 mL/min/{1.73_m2} (ref >90)
Glucose: 91 mg/dl (ref 70–140)
Potassium: 4 mEq/L (ref 3.5–5.1)
Sodium: 141 mEq/L (ref 136–145)
Total Bilirubin: 0.29 mg/dL (ref 0.20–1.20)
Total Protein: 7.2 g/dL (ref 6.4–8.3)

## 2016-05-22 MED ORDER — DIPHENHYDRAMINE HCL 25 MG PO CAPS
ORAL_CAPSULE | ORAL | Status: AC
  Filled 2016-05-22: qty 1

## 2016-05-22 MED ORDER — ACETAMINOPHEN 325 MG PO TABS
650.0000 mg | ORAL_TABLET | Freq: Once | ORAL | Status: AC
  Administered 2016-05-22: 650 mg via ORAL

## 2016-05-22 MED ORDER — SODIUM CHLORIDE 0.9 % IV SOLN
Freq: Once | INTRAVENOUS | Status: AC
  Administered 2016-05-22: 15:00:00 via INTRAVENOUS

## 2016-05-22 MED ORDER — TRASTUZUMAB CHEMO 150 MG IV SOLR
6.0000 mg/kg | Freq: Once | INTRAVENOUS | Status: AC
  Administered 2016-05-22: 504 mg via INTRAVENOUS
  Filled 2016-05-22: qty 24

## 2016-05-22 MED ORDER — DIPHENHYDRAMINE HCL 25 MG PO CAPS
25.0000 mg | ORAL_CAPSULE | Freq: Once | ORAL | Status: AC
  Administered 2016-05-22: 25 mg via ORAL

## 2016-05-22 MED ORDER — SODIUM CHLORIDE 0.9% FLUSH
10.0000 mL | INTRAVENOUS | Status: DC | PRN
  Administered 2016-05-22: 10 mL
  Filled 2016-05-22: qty 10

## 2016-05-22 MED ORDER — ACETAMINOPHEN 325 MG PO TABS
ORAL_TABLET | ORAL | Status: AC
  Filled 2016-05-22: qty 2

## 2016-05-22 MED ORDER — HEPARIN SOD (PORK) LOCK FLUSH 100 UNIT/ML IV SOLN
500.0000 [IU] | Freq: Once | INTRAVENOUS | Status: AC | PRN
  Administered 2016-05-22: 500 [IU]
  Filled 2016-05-22: qty 5

## 2016-05-22 NOTE — Patient Instructions (Signed)
[facility] Discharge Instructions for Patients Receiving Chemotherapy  Today you received the following: Herceptin   To help prevent nausea and vomiting after your treatment, we encourage you to take your nausea medication as directed.    If you develop nausea and vomiting that is not controlled by your nausea medication, call the clinic.   BELOW ARE SYMPTOMS THAT SHOULD BE REPORTED IMMEDIATELY:  *FEVER GREATER THAN 100.5 F  *CHILLS WITH OR WITHOUT FEVER  NAUSEA AND VOMITING THAT IS NOT CONTROLLED WITH YOUR NAUSEA MEDICATION  *UNUSUAL SHORTNESS OF BREATH  *UNUSUAL BRUISING OR BLEEDING  TENDERNESS IN MOUTH AND THROAT WITH OR WITHOUT PRESENCE OF ULCERS  *URINARY PROBLEMS  *BOWEL PROBLEMS  UNUSUAL RASH Items with * indicate a potential emergency and should be followed up as soon as possible.  Feel free to call the clinic you have any questions or concerns. The clinic phone number is [phone].  Please show the CHEMO ALERT CARD at check-in to the Emergency Department and triage nurse.   

## 2016-05-22 NOTE — Progress Notes (Signed)
Location of Breast Cancer: Left Breast  Histology per Pathology Report: 01/14/17  Diagnosis Breast, left, needle core biopsy, mass 10 o'clock ant depth - INVASIVE MAMMARY CARCINOMA. - MAMMARY CARCINOMA IN SITU.  Receptor Status: ER(90%), PR (40%), Her2-neu (POS), Ki-(5%)  01/14/17 Diagnosis Breast, left, needle core biopsy, calcifications 10 o'clock- central middle depth - FOCAL ATYPICAL DUCTAL HYPERPLASIA. - FIBROCYSTIC CHANGES.  02/19/16 Diagnosis Breast, left, needle core biopsy, 6 o'clock retroareolar - FIBROCYSTIC CHANGES. - THERE IS NO EVIDENCE OF MALIGNANCY.  03/13/16 Diagnosis 1. Breast, lumpectomy, left with seed x2 - INVASIVE DUCTAL CARCINOMA GRADE II/III, MULTIPLE FOCI, SPANNING 1.3 CM, 0.8 CM, AND 0.5 CM. - DUCTAL CARCINOMA IN SITU WITH CALCIFICATIONS, INTERMEDIATE GRADE. - LYMPHOVASCULAR INVASION IS IDENTIFIED. - THE SURGICAL RESECTIONS MARGINS ARE NEGATIVE FOR CARCINOMA. - SEE ONCOLOGY TABLE BELOW. 2. Lymph node, sentinel, biopsy, left axillary #1 - THERE IS NO EVIDENCE OF CARCINOMA IN 1 OF 1 LYMPH NODE (0/1). 3. Lymph node, sentinel, biopsy, left axillary #2 - THERE IS NO EVIDENCE OF CARCINOMA IN 1 OF 1 LYMPH NODE (0/1).  Did patient present with symptoms or was this found on screening mammography?: She noted some itching on her breasts and since this was the symptom that preceded her grandmother's breast cancer she underwent baseline bilateral screening mammography at [facility] 01/10/2016  Past/Anticipated interventions by surgeon, if any: Dr. Barry Dienes 03/13/16 Left Breast Radioactive seed localized lumpectomy (2 seeds), sentinel lymph node biopsy, port placement  Past/Anticipated interventions by medical oncology, if any:  Dr. Jana Hakim 05/22/16  Paclitaxel weekly 12 and trastuzumab every 21 days beginning on 04/10/2016.             (a) paclitaxel discontinued after 5 doses because of atypical neuropathy symptoms. Last dose 04/28/2016.    Trastuzumab to be  continued to complete a year--through February 2019             (a) echocardiogram 02/11/2016 shows ejection fraction in the 60-65% range.   Adjuvant radiation to follow chemotherapy   Anti-estrogens to resume at the completion of local treatment, to be continued for 5-10 years  Lymphedema issues, if any:  She denies. She has good arm mobility.   Pain issues, if any: She denies   SAFETY ISSUES:  Prior radiation? No  Pacemaker/ICD? no  Possible current pregnancy? No, she is not currently sexually active. She would use a condom for birth control if needed.   Is the patient on methotrexate? No  Current Complaints / other details:    BP (!) 126/91   Pulse 74   Temp 98.4 F (36.9 C)   Ht _0  (1.702 m)   Wt 198 lb 12.8 oz (90.2 kg)   SpO2 98% Comment: room air  BMI 31.14 kg/m    Wt Readings from Last 3 Encounters:  06/03/16 198 lb 12.8 oz (90.2 kg)  05/28/16 197 lb 8 oz (89.6 kg)  05/22/16 197 lb 8 oz (89.6 kg)      Avyonna Wagoner, Stephani Police, RN 05/22/2016,9:26 AM

## 2016-05-22 NOTE — Progress Notes (Signed)
[place]  [phone] [phone]     ID: VARNELL DONATE DOB: 06-Jan-1981  MR#: [id number]  XMI#:[id number]  Patient Care Team: [name], MD as PCP - General (Family Medicine) Stark Klein, MD as Consulting Physician (General Surgery) Chauncey Cruel, MD as Consulting Physician (Oncology) Eppie Gibson, MD as Attending Physician (Radiation Oncology) Gardenia Phlegm, NP as Nurse Practitioner (Hematology and Oncology) Chauncey Cruel, MD OTHER MD:  CHIEF COMPLAINT:  Estrogen receptor positive breast cancer  CURRENT TREATMENT:  Adjuvant radiation pending; continuing anti-HER-2 immunotherapy  BREAST CANCER HISTORY: From the original intake note:  Virginia [name] noted some itching on her breasts and since this was the symptom that preceded her grandmother's breast cancer she underwent baseline bilateral screening mammography at [facility] 01/10/2016. The breast density was category B. In the left breast central to the nipple there was a 1.7 cm irregular high density mass with pleomorphic calcifications. Accordingly the patient was brought back 01/14/2016 for left diagnostic mammography and ultrasonography. This confirmed a 0.8 cm irregular mass in the left breast at the 10:00 position. 0.5 cm from the nipple lateral and posterior to that there was a 0.2 cm cluster of amorphous calcifications with an associated density. Anterior to the index mass was a 0.5 cm cluster of amorphous calcifications. All 3 findings taken together 3 cm. Ultrasound of the left breast found a 1 cm irregular mass in the left breast at the 10:00 position retroareolarly. There were no other sonographic abnormalities and the left axilla was sonographically benign  On 01/15/2016 the patient underwent core needle biopsy of the left breast index lesion at 10:00, and this found invasive ductal carcinoma, E-cadherin positive, estrogen receptor 90% positive, progesterone receptor 40%  positive, both with moderate staining intensity, with an MIB-1 of 5%, but HER-2 amplified, with a signals ratio of 2.41 and the number per cell 6.50. One of the 2 areas of abnormal calcifications, the more posterior one measuring 0.2 cm, was also biopsied and showed only atypical ductal hyperplasia (S AAA [id number] and [id number]).  Her subsequent history is as detailed below  INTERVAL HISTORY: Virginia [name] returns today for 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 She has been receiving Taxol weekly together with trastuzumab every 3 weeks. However she has developed some unusual symptoms and while they are atypical for neuropathy, which usually begins in the finger pads, the R associated with her chemotherapy in the sense that they were not present prior to those treatments.  More specifically she feels like she has bees stinging her hands. This is the dorsum of her hand as well as the palms and sometimes the lower forearms. It is very inconstant. She takes Neurontin for this with mild relief. The problem is worse in the evening. Sometimes she wears I's pads to deal with it. She also developed "a hot flash in her left foot" which is also unusual. Because in someway the symptoms appear to be related to her Taxol treatments it raises the question of whether we should continue chemotherapy with that agent, switch to a different agent, or discontinue the chemotherapy while of course continuing the anti-HER-2 treatment.  REVIEW OF SYSTEMS: Virginia [name] is very concerned about losing function in her hands but also very worried about ultimate development of metastatic disease. This is understandable. Aside from the problems just discussed a detailed review of systems today was stable  PAST MEDICAL HISTORY: Past Medical History:  Diagnosis Date  . Anemia    as a child  .  Anxiety   . Cholestasis   . Elevated liver enzymes    during pregnancy March 2015  . Family history of breast cancer   . Family history of  prostate cancer   . GERD (gastroesophageal reflux disease)    one episode  . Headache   . Hypothyroidism   . Malignant neoplasm of upper-inner quadrant of left female breast ([place]) 01/16/2016    PAST SURGICAL HISTORY: Past Surgical History:  Procedure Laterality Date  . BREAST LUMPECTOMY WITH RADIOACTIVE SEED AND SENTINEL LYMPH NODE BIOPSY Left 03/13/2016   Procedure: LEFT BREAST LUMPECTOMY WITH RADIOACTIVE SEED X 2 AND SENTINEL LYMPH NODE BIOPSY;  Surgeon: Stark Klein, MD;  Location: [place];  Service: General;  Laterality: Left;  . NO PAST SURGERIES    . PORTACATH PLACEMENT Right 03/13/2016   Procedure: INSERTION PORT-A-CATH;  Surgeon: Stark Klein, MD;  Location: [facility];  Service: General;  Laterality: Right;    FAMILY HISTORY Family History  Problem Relation Age of Onset  . Hypertension Mother   . Hypertension Father   . Prostate cancer Father   . Liver cancer Maternal Grandmother   . Breast cancer Paternal Grandmother     dx in her 67s  . Lung cancer Paternal Aunt     smoker  . Heart attack Paternal Grandfather   . Liver cancer Other   The patient's parents are alive, in their mid 4s as of November 2017. The patient father was diagnosed with prostate cancer at age 38. A paternal aunt was diagnosed with lung cancer in her late 71s. Also the paternal grandmother was diagnosed with breast cancer at the age of 52. On the mother's side there are 2 cases of liver cancer, age of diagnosis unknown.  GYNECOLOGIC HISTORY:  No LMP recorded.  Menarche age 50, first live birth age 12, she is Virginia [name] P2. She still having periods, most recently 01/21/2016, but they are no longer regular. She remotely used a number ring and oral contraceptives but stopped in 2016.Currently there use barrier methods for contraception.   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 Her husband [name] works as a Printmaker for [employer] (for example he worked on  our (). Their children are Virginia [name] and Virginia [name],, aged 41 and 2 as of November 2017. Attention and is not a church attender    ADVANCED DIRECTIVES: Not in place   HEALTH MAINTENANCE: Social History  Substance Use Topics  . Smoking status: Never Smoker  . Smokeless tobacco: Never Used  . Alcohol use Yes     Comment: occasional     Colonoscopy:  PAP:2016  Bone density:   Allergies  Allergen Reactions  . Amoxicillin Diarrhea and Nausea Only    Has patient had a PCN reaction causing immediate rash, facial/tongue/throat swelling, SOB or lightheadedness with hypotension: No Has patient had a PCN reaction causing severe rash involving mucus membranes or skin necrosis: No Has patient had a PCN reaction that required hospitalization No Has patient had a PCN reaction occurring within the last 10 years: Yes If all of the above answers are "NO", then may proceed with Cephalosporin use.     Current Outpatient Prescriptions  Medication Sig Dispense Refill  . acetaminophen (TYLENOL) 500 MG tablet Take 500-1,000 mg by mouth every 6 (six) hours as needed (for headaches.).    Marland Kitchen fluticasone (FLONASE) 50 MCG/ACT nasal spray Place 1-2 sprays into both nostrils daily as needed for allergies or  rhinitis.    Marland Kitchen gabapentin (NEURONTIN) 300 MG capsule Take 1 capsule (300 mg total) by mouth at bedtime. 30 capsule 2  . ibuprofen (ADVIL,MOTRIN) 200 MG tablet Take 400 mg by mouth every 8 (eight) hours as needed (for pain.).    Marland Kitchen lidocaine-prilocaine (EMLA) cream Apply to affected area once 30 g 3  . LORazepam (ATIVAN) 0.5 MG tablet Take 1 tablet (0.5 mg total) by mouth at bedtime as needed for anxiety. 30 tablet 1  . Multiple Vitamins-Minerals (MULTIVITAMIN GUMMIES ADULT PO) Take 2 tablets by mouth daily.    . ondansetron (ZOFRAN) 8 MG tablet Take 1 tablet (8 mg total) by mouth 2 (two) times daily as needed (Nausea or vomiting). 30 tablet 1  . prochlorperazine (COMPAZINE) 10 MG tablet Take 1 tablet  (10 mg total) by mouth every 6 (six) hours as needed (Nausea or vomiting). 30 tablet 1   No current facility-administered medications for this visit.     OBJECTIVE: Young-appearing woman In no acute distress  Vitals:   05/22/16 0846  BP: 133/87  Pulse: 72  Resp: 20  Temp: 98 F (36.7 C)     Body mass index is 30.93 kg/m.    ECOG FS:1 - Symptomatic but completely ambulatory   Sclerae unicteric, EOMs intact Oropharynx clear and moist No cervical or supraclavicular adenopathy Lungs no rales or rhonchi Heart regular rate and rhythm Abd soft, nontender, positive bowel sounds MSK no focal spinal tenderness, no upper extremity lymphedema Neuro: nonfocal, well oriented, appropriate affect Breasts: Deferred  LAB RESULTS:  CMP     Component Value Date/Time   NA 141 05/08/2016 0908   K 4.1 05/08/2016 0908   CL 107 03/07/2016 1612   CO2 25 05/08/2016 0908   GLUCOSE 85 05/08/2016 0908   BUN 7.7 05/08/2016 0908   CREATININE 0.8 05/08/2016 0908   CALCIUM 9.5 05/08/2016 0908   PROT 7.1 05/08/2016 0908   ALBUMIN 3.9 05/08/2016 0908   AST 22 05/08/2016 0908   ALT 34 05/08/2016 0908   ALKPHOS 67 05/08/2016 0908   BILITOT <0.22 05/08/2016 0908   GFRNONAA >60 03/07/2016 1612   GFRAA >60 03/07/2016 1612    INo results found for: SPEP, UPEP  Lab Results  Component Value Date   WBC 5.3 05/22/2016   NEUTROABS 2.0 05/22/2016   HGB 11.1 (L) 05/22/2016   HCT 35.5 05/22/2016   MCV 86.4 05/22/2016   PLT 235 05/22/2016      Chemistry      Component Value Date/Time   NA 141 05/08/2016 0908   K 4.1 05/08/2016 0908   CL 107 03/07/2016 1612   CO2 25 05/08/2016 0908   BUN 7.7 05/08/2016 0908   CREATININE 0.8 05/08/2016 0908      Component Value Date/Time   CALCIUM 9.5 05/08/2016 0908   ALKPHOS 67 05/08/2016 0908   AST 22 05/08/2016 0908   ALT 34 05/08/2016 0908   BILITOT <0.22 05/08/2016 0908       No results found for: LABCA2  No components found for: LABCA125  No  results for input(s): INR in the last 168 hours.  Urinalysis    Component Value Date/Time   COLORURINE YELLOW 10/14/2012 1954   APPEARANCEUR CLEAR 10/14/2012 1954   LABSPEC >1.030 (H) 10/14/2012 1954   PHURINE 6.0 10/14/2012 1954   GLUCOSEU NEGATIVE 10/14/2012 1954   HGBUR TRACE (A) 10/14/2012 [place] NEGATIVE 10/14/2012 1954   KETONESUR 15 (A) 10/14/2012 [place] NEGATIVE 10/14/2012 1954  UROBILINOGEN 0.2 10/14/2012 1954   NITRITE NEGATIVE 10/14/2012 1954   LEUKOCYTESUR NEGATIVE 10/14/2012 1954     STUDIES: No results found.   ELIGIBLE FOR AVAILABLE RESEARCH PROTOCOL: no  ASSESSMENT: 36 y.o. [place] woman status post left breast upper inner quadrant biopsy 01/15/2016 for a clinical T1c N0, stage IA invasive ductal carcinoma, E-cadherin positive, estrogen receptor 90% positive, progesterone receptor 40% positive, both with moderate staining intensity, with an MIB-1 of 5%, but HER-2 amplified, with a signals ratio of 2.41 and the number per cell 6.50.  (a) One of 2 areas of abnormal calcifications, the more posterior one measuring 0.2 cm, was also biopsied and showed only atypical ductal hyperplasia  (b) a 4.1 cm area of non-masslike enhancement in the left breast underwent biopsy 02/19/2016 showing only fibrocystic changes  (1) tamoxifen started 01/23/2016 neoadjuvantly  (2) genetics testing 02/22/2016 through the Breast/Ovarian gene panel offered by [facility] found no deleterious mutations in  ATM, BARD1, BRCA1, BRCA2, BRIP1, CDH1, CHEK2, EPCAM, FANCC, MLH1, MSH2, MSH6, NBN, PALB2, PMS2, PTEN, RAD51C, RAD51D, TP53, and XRCC2.     (3) status post left lumpectomy and sentinel lymph node sampling 03/13/2016 for an mpT1c pN0, stage IA invasive ductal carcinoma, grade 2, with negative margins.  (4) paclitaxel weekly 12 and trastuzumab every 21 days beginning on 04/10/2016.  (a) paclitaxel discontinued after 5 doses because of atypical neuropathy symptoms. Last  dose 04/28/2016  (5) trastuzumab to be continued to complete a year--through February 2019  (a) echocardiogram 02/11/2016 shows ejection fraction in the 60-65% range.  (6) adjuvant radiation to follow chemotherapy  (7) anti-estrogens to resume at the completion of local treatment, to be continued for 5-10 years  PLAN: Virginia [name]'s symptoms are very atypical for neuropathy, and may well be due to carpal tunnel. However they are disabling, poorly controlled on Neurontin, and interfering with her activities of daily living and to some extent her ability to work. In some way the symptoms are related to her treatment.  Accordingly we are going to stop Taxol at this point. We're going to continue the Herceptin,  the more important portion of her treatment and she is already scheduled for repeat echocardiogram 05/28/2016 under Dr. Haroldine Laws. I have scheduled her to see me again in 06/12/2016 and also she will see Dr. Isidore Moos in the interval to start planning her radiation treatments.  At the same time that Virginia [name] is relieved that she will not develop permanent peripheral neuropathy she now worries that she will die from metastatic breast cancer. I think her prognosis is good, and as soon as she completes her radiation treatments she will start antiestrogen's. I expect her chance of being alive and disease-free in 5 years will be well over 90% even though she only received 5 of the 12 planned Taxol treatments  I suggested that she obtain a splint for her wrist and hopefully that will begin to relieve the symptoms she is experiencing. She is going to continue the Neurontin for now. She will call with any problems that may develop before her next visit here.   Chauncey Cruel, MD   05/22/2016 9:19 AM Medical Oncology and Hematology [facility] [address] [place], [place] [address] Tel. [phone]    Fax. [phone]

## 2016-05-28 ENCOUNTER — Ambulatory Visit (HOSPITAL_COMMUNITY)
Admission: RE | Admit: 2016-05-28 | Discharge: 2016-05-28 | Disposition: A | Discharge status: Home / Self Care | Payer: PRIVATE HEALTH INSURANCE | Source: Ambulatory Visit | Attending: Family Medicine | Admitting: Family Medicine

## 2016-05-28 ENCOUNTER — Encounter (HOSPITAL_COMMUNITY): Payer: Self-pay | Admitting: Internal Medicine

## 2016-05-28 ENCOUNTER — Ambulatory Visit (HOSPITAL_BASED_OUTPATIENT_CLINIC_OR_DEPARTMENT_OTHER)
Admission: RE | Admit: 2016-05-28 | Discharge: 2016-05-28 | Disposition: A | Discharge status: Home / Self Care | Payer: PRIVATE HEALTH INSURANCE | Source: Ambulatory Visit | Attending: Internal Medicine | Admitting: Internal Medicine

## 2016-05-28 VITALS — BP 130/78 | HR 79 | Wt 197.5 lb

## 2016-05-28 DIAGNOSIS — Z8249 Family history of ischemic heart disease and other diseases of the circulatory system: Secondary | ICD-10-CM | POA: Insufficient documentation

## 2016-05-28 DIAGNOSIS — Z170 Estrogen receptor positive status [ER+]: Secondary | ICD-10-CM

## 2016-05-28 DIAGNOSIS — Z880 Allergy status to penicillin: Secondary | ICD-10-CM | POA: Insufficient documentation

## 2016-05-28 DIAGNOSIS — Z801 Family history of malignant neoplasm of trachea, bronchus and lung: Secondary | ICD-10-CM | POA: Diagnosis not present

## 2016-05-28 DIAGNOSIS — Z803 Family history of malignant neoplasm of breast: Secondary | ICD-10-CM | POA: Insufficient documentation

## 2016-05-28 DIAGNOSIS — Z800 Family history of malignant neoplasm of digestive organs: Secondary | ICD-10-CM | POA: Insufficient documentation

## 2016-05-28 DIAGNOSIS — Z8042 Family history of malignant neoplasm of prostate: Secondary | ICD-10-CM | POA: Insufficient documentation

## 2016-05-28 DIAGNOSIS — Z9889 Other specified postprocedural states: Secondary | ICD-10-CM | POA: Diagnosis not present

## 2016-05-28 DIAGNOSIS — C50212 Malignant neoplasm of upper-inner quadrant of left female breast: Secondary | ICD-10-CM | POA: Insufficient documentation

## 2016-05-28 DIAGNOSIS — I714 Abdominal aortic aneurysm, without rupture: Secondary | ICD-10-CM | POA: Diagnosis not present

## 2016-05-28 NOTE — Progress Notes (Signed)
  Echocardiogram 2D Echocardiogram has been performed.  Jennette Dubin 05/28/2016, 2:49 PM

## 2016-05-28 NOTE — Progress Notes (Signed)
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 Jana Hakim for enrollment into the Cardio-Oncology program.    On 01/15/2016 the patient underwent core needle biopsy of the left breast index lesion at 10:00, and this found invasive ductal carcinoma, E-cadherin positive, estrogen receptor 90% positive, progesterone receptor 40% positive, both with moderate staining intensity, with an MIB-1 of 5%, but HER-2 amplified, with a signals ratio of 2.41 and the number per cell 6.50. One of the 2 areas of abnormal calcifications, the more posterior one measuring 0.2 cm, was also biopsied and showed only atypical ductal hyperplasia (S AAA [id number] and [id number]).   Cancer history as follow:   (1) tamoxifen started 01/23/2016 neoadjuvantly (2) status post left lumpectomy and sentinel lymph node sampling 03/13/2016 for an mpT1c pN0, stage IA invasive ductal carcinoma, grade 2, with negative margins. (3) paclitaxel weekly 12 and trastuzumab every 21 days beginning on 04/10/2016.  (a) paclitaxel discontinued after 5 doses because of atypical neuropathy symptoms. Last dose 04/28/2016 (4) trastuzumab to be continued to complete a year--through February 2019  (a) echocardiogram 02/11/2016 shows ejection fraction in the 60-65% range. (5) adjuvant radiation to follow chemotherapy (6) anti-estrogens to resume at the completion of local treatment, to be continued for 5-10 years   Started initial chemo  in February 2018. Tolerated well except for neuropath from Taxol. Denies any h/o known heart disease or cardiac RFs. Denies SOB, orthopnea, PND or edema.   Echo 05/28/16: EF 65-70%.  Lateral s' 11.2 cm/s. GLS -22.4%   PAST MEDICAL HISTORY: Past Medical History:  Diagnosis Date  . Anemia    as a child  . Anxiety   . Cholestasis   . Elevated liver enzymes    during pregnancy March 2015  . Family history of breast cancer   . Family history of prostate cancer   . GERD (gastroesophageal reflux disease)    one episode  . Headache   . Hypothyroidism   . Malignant neoplasm of upper-inner quadrant of left female breast ([place]) 01/16/2016    PAST SURGICAL HISTORY: Past Surgical History:  Procedure Laterality Date  . BREAST LUMPECTOMY WITH RADIOACTIVE SEED AND SENTINEL LYMPH NODE BIOPSY Left 03/13/2016   Procedure: LEFT BREAST LUMPECTOMY WITH RADIOACTIVE SEED X 2 AND SENTINEL LYMPH NODE BIOPSY;  Surgeon: Stark Klein, MD;  Location: [place];  Service: General;  Laterality: Left;  . NO PAST SURGERIES    . PORTACATH PLACEMENT Right 03/13/2016   Procedure: INSERTION PORT-A-CATH;  Surgeon: Stark Klein, MD;  Location[facility];  Service: General;  Laterality: Right;    FAMILY HISTORY Family History  Problem Relation Age of Onset  . Hypertension Mother   . Hypertension Father   . Prostate cancer Father   . Liver cancer Maternal Grandmother   . Breast cancer Paternal Grandmother     dx in her 47s  . Lung cancer Paternal Aunt     smoker  . Heart attack Paternal Grandfather   . Liver cancer Other   The patient's parents are  alive, in their mid 68s as of November 2017. The patient father was diagnosed with prostate cancer at age 102. A paternal aunt was diagnosed with lung cancer in her late 65s. Also the paternal grandmother was diagnosed with breast cancer at the age of 73. On the mother's side there are 2 cases of liver cancer, age of diagnosis unknown.  GYNECOLOGIC HISTORY:  No LMP recorded.    Menarche age 63, first live birth age 59, she is [place] P2. She still having periods, most recently 01/21/2016, but they are no longer regular. She remotely used a number ring and oral contraceptives but stopped in 2016.Currently there use barrier methods for contraception.   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 Her husband [name] works as a Printmaker for a [employer]. Their children are [name] and [name], aged 62 and 2 as of November 2017.     HEALTH MAINTENANCE: Social History  Substance Use Topics  . Smoking status: Never Smoker  . Smokeless tobacco: Never Used  . Alcohol use Yes     Comment: occasional     Colonoscopy:  PAP:2016  Bone density:   Allergies  Allergen Reactions  . Amoxicillin Diarrhea and Nausea Only    Has patient had a PCN reaction causing immediate rash, facial/tongue/throat swelling, SOB or lightheadedness with hypotension: No Has patient had a PCN reaction causing severe rash involving mucus membranes or skin necrosis: No Has patient had a PCN reaction that required hospitalization No Has patient had a PCN reaction occurring within the last 10 years: Yes If all of the above answers are "NO", then may proceed with Cephalosporin use.     Current Outpatient Prescriptions  Medication Sig Dispense Refill  . acetaminophen (TYLENOL) 500 MG tablet Take 500-1,000 mg by mouth every 6 (six) hours as needed (for headaches.).    Marland Kitchen gabapentin (NEURONTIN) 300 MG capsule Take 1 capsule (300 mg total) by mouth at bedtime. 30 capsule 2  . ibuprofen (ADVIL,MOTRIN) 200 MG tablet Take 400 mg by mouth every 8 (eight) hours as needed (for pain.).    Marland Kitchen lidocaine-prilocaine (EMLA) cream Apply to affected area once 30 g 3  . LORazepam (ATIVAN) 0.5 MG tablet Take 1 tablet (0.5 mg total) by mouth at bedtime as needed for anxiety. 30 tablet 1  . Multiple Vitamins-Minerals (MULTIVITAMIN GUMMIES ADULT PO) Take 2  tablets by mouth daily.    . ondansetron (ZOFRAN) 8 MG tablet Take 1 tablet (8 mg total) by mouth 2 (two) times daily as needed (Nausea or vomiting). 30 tablet 1  . prochlorperazine (COMPAZINE) 10 MG tablet Take 1 tablet (10 mg total) by mouth every 6 (six) hours as needed (Nausea or vomiting). 30 tablet 1  . fluticasone (FLONASE) 50 MCG/ACT nasal spray Place 1-2 sprays into both nostrils daily as needed for allergies or rhinitis.     No current facility-administered medications for this encounter.     Vitals:   05/28/16 1440  BP: 130/78  Pulse: 79     Body mass index is 30.93 kg/m.    ECOG FS:1 - Symptomatic but completely ambulatory   Exam: General:  Well appearing. No resp difficulty HEENT: normal Neck: supple. no JVD. Left-sided port Carotids 2+ bilat; no bruits. No lymphadenopathy or thryomegaly appreciated. Cor: PMI nondisplaced. Regular rate & rhythm. No rubs, gallops or murmurs. Lungs: clear Abdomen: obese soft, nontender, nondistended. No hepatosplenomegaly. No bruits or masses. Good bowel sounds. Extremities: no cyanosis,  clubbing, rash, edema Neuro: alert & orientedx3, cranial nerves grossly intact. moves all 4 extremities w/o difficulty. Affect pleasant  LAB RESULTS:  CMP     Component Value Date/Time   NA 141 05/22/2016 0835   K 4.0 05/22/2016 0835   CL 107 03/07/2016 1612   CO2 25 05/22/2016 0835   GLUCOSE 91 05/22/2016 0835   BUN 8.2 05/22/2016 0835   CREATININE 0.8 05/22/2016 0835   CALCIUM 9.3 05/22/2016 0835   PROT 7.2 05/22/2016 0835   ALBUMIN 4.0 05/22/2016 0835   AST 18 05/22/2016 0835   ALT 21 05/22/2016 0835   ALKPHOS 66 05/22/2016 0835   BILITOT 0.29 05/22/2016 0835   GFRNONAA >60 03/07/2016 1612   GFRAA >60 03/07/2016 1612    INo results found for: SPEP, UPEP  Lab Results  Component Value Date   WBC 5.3 05/22/2016   NEUTROABS 2.0 05/22/2016   HGB 11.1 (L) 05/22/2016   HCT 35.5 05/22/2016   MCV 86.4 05/22/2016   PLT 235 05/22/2016       Chemistry      Component Value Date/Time   NA 141 05/22/2016 0835   K 4.0 05/22/2016 0835   CL 107 03/07/2016 1612   CO2 25 05/22/2016 0835   BUN 8.2 05/22/2016 0835   CREATININE 0.8 05/22/2016 0835      Component Value Date/Time   CALCIUM 9.3 05/22/2016 0835   ALKPHOS 66 05/22/2016 0835   AST 18 05/22/2016 0835   ALT 21 05/22/2016 0835   BILITOT 0.29 05/22/2016 0835       No results found for: LABCA2  No components found for: LABCA125  No results for input(s): INR in the last 168 hours.  Urinalysis    Component Value Date/Time   COLORURINE YELLOW 10/14/2012 1954   APPEARANCEUR CLEAR 10/14/2012 1954   LABSPEC >1.030 (H) 10/14/2012 1954   PHURINE 6.0 10/14/2012 1954   GLUCOSEU NEGATIVE 10/14/2012 1954   HGBUR TRACE (A) 10/14/2012 1954   BILIRUBINUR NEGATIVE 10/14/2012 1954   KETONESUR 15 (A) 10/14/2012 1954   PROTEINUR NEGATIVE 10/14/2012 1954   UROBILINOGEN 0.2 10/14/2012 1954   NITRITE NEGATIVE 10/14/2012 1954   LEUKOCYTESUR NEGATIVE 10/14/2012 1954    Assessment/Plan:  1. Left breast CA --triple positive. s/p lumpectomy --Herceptin started 2/18  --Explained incidence of Herceptin cardiotoxicity and role of Cardio-oncology clinic at length. Echo images reviewed personally. All parameters stable. Reviewed signs and symptoms of HF to look for. Continue Herceptin. Follow-up with echo in 3 months.   Glori Bickers, MD  9:56 PM  .

## 2016-05-28 NOTE — Patient Instructions (Signed)
We will contact you in 3 months to schedule your next appointment and echocardiogram  

## 2016-05-29 ENCOUNTER — Ambulatory Visit: Payer: PRIVATE HEALTH INSURANCE

## 2016-05-29 ENCOUNTER — Other Ambulatory Visit: Payer: PRIVATE HEALTH INSURANCE

## 2016-06-03 ENCOUNTER — Encounter: Payer: Self-pay | Admitting: Radiation Oncology

## 2016-06-03 ENCOUNTER — Ambulatory Visit
Admission: RE | Admit: 2016-06-03 | Discharge: 2016-06-03 | Disposition: A | Discharge status: Home / Self Care | Payer: PRIVATE HEALTH INSURANCE | Source: Ambulatory Visit | Attending: Radiation Oncology | Admitting: Radiation Oncology

## 2016-06-03 DIAGNOSIS — Z9221 Personal history of antineoplastic chemotherapy: Secondary | ICD-10-CM | POA: Diagnosis not present

## 2016-06-03 DIAGNOSIS — C50212 Malignant neoplasm of upper-inner quadrant of left female breast: Secondary | ICD-10-CM

## 2016-06-03 DIAGNOSIS — Z170 Estrogen receptor positive status [ER+]: Secondary | ICD-10-CM | POA: Diagnosis not present

## 2016-06-03 DIAGNOSIS — Z510 Encounter for antineoplastic radiation therapy: Secondary | ICD-10-CM | POA: Diagnosis not present

## 2016-06-03 DIAGNOSIS — Z880 Allergy status to penicillin: Secondary | ICD-10-CM | POA: Diagnosis not present

## 2016-06-03 DIAGNOSIS — Y842 Radiological procedure and radiotherapy as the cause of abnormal reaction of the patient, or of later complication, without mention of misadventure at the time of the procedure: Secondary | ICD-10-CM | POA: Diagnosis not present

## 2016-06-03 DIAGNOSIS — Z79899 Other long term (current) drug therapy: Secondary | ICD-10-CM | POA: Diagnosis not present

## 2016-06-03 NOTE — Progress Notes (Signed)
Radiation Oncology         [phone] ________________________________  Name: Virginia [name] MRN: [id number]  Date: 06/03/2016  DOB: 10-12-1980  Follow-Up Visit Note  Outpatient  CC: Virginia Harada, MD  Magrinat, Virgie Dad, MD  Diagnosis:      ICD-9-CM ICD-10-CM   1. Malignant neoplasm of upper-inner quadrant of left breast in female, estrogen receptor positive ([place]) 174.2 C50.212    V86.0 Z17.0   Clinical stage IA, pathologic stage mpT1c pN0 (stage IB) multifocal invasive ductal carcinom, grade II, ER 90%, PR 40%, HER2 positive.   CHIEF COMPLAINT: Here to discuss management of left breast cancer  Narrative:  The patient returns today for follow-up.     Since consultation, she underwent lumpectomy and sentinel lymph node biopsy on 03/13/16. This revealed multifocal left breast cancer, spanning 1.3, 0.8, 0.5 cm. There was LVSI. The margins were negative by at least 2 mm. The two sentinel lymph nodes were negative. She then proceeded with adjuvant chemotherapy and herceptin. Her last dose of paclitaxel was on 04/28/16. She will receive antiestrogens after completion of RT. She denies recent sexual activity for several months or chance of pregnancy.  Pregnancy test in [facility] was negative in January. She denies any pain.             ALLERGIES:  is allergic to amoxicillin.  Meds: Current Outpatient Prescriptions  Medication Sig Dispense Refill  . acetaminophen (TYLENOL) 500 MG tablet Take 500-1,000 mg by mouth every 6 (six) hours as needed (for headaches.).    Marland Kitchen fluticasone (FLONASE) 50 MCG/ACT nasal spray Place 1-2 sprays into both nostrils daily as needed for allergies or rhinitis.    Marland Kitchen gabapentin (NEURONTIN) 300 MG capsule Take 1 capsule (300 mg total) by mouth at bedtime. 30 capsule 2  . lidocaine-prilocaine (EMLA) cream Apply to affected area once 30 g 3  . LORazepam (ATIVAN) 0.5 MG tablet Take 1 tablet (0.5 mg total) by mouth at bedtime as needed for anxiety. 30 tablet 1  .  Multiple Vitamins-Minerals (MULTIVITAMIN GUMMIES ADULT PO) Take 2 tablets by mouth daily.    . ondansetron (ZOFRAN) 8 MG tablet Take 1 tablet (8 mg total) by mouth 2 (two) times daily as needed (Nausea or vomiting). 30 tablet 1  . ibuprofen (ADVIL,MOTRIN) 200 MG tablet Take 400 mg by mouth every 8 (eight) hours as needed (for pain.).    Marland Kitchen prochlorperazine (COMPAZINE) 10 MG tablet Take 1 tablet (10 mg total) by mouth every 6 (six) hours as needed (Nausea or vomiting). (Patient not taking: Reported on 06/03/2016) 30 tablet 1   No current facility-administered medications for this encounter.     Physical Findings:  height is '5\' 7"'  (1.702 m) and weight is 198 lb 12.8 oz (90.2 kg). Her temperature is 98.4 F (36.9 C). Her blood pressure is 126/91 (abnormal) and her pulse is 74. Her oxygen saturation is 98%. .     General: Alert and oriented, in no acute distress  Neck: Neck is supple, no palpable cervical or supraclavicular lymphadenopathy. Heart: Regular in rate and rhythm with no murmurs, rubs, or gallops. Chest: Clear to auscultation bilaterally, with no rhonchi, wheezes, or rales. Abdomen: Soft, nontender, nondistended, with no rigidity or guarding. Extremities: No cyanosis or edema. Lymphatics: see Neck Exam Psychiatric: Judgment and insight are intact. Affect is appropriate. Breast exam reveals no palpable masses bilaterally in the breasts or axillae. Surgical scars have healed well.  Lab Findings: Lab Results  Component Value Date   WBC  5.3 05/22/2016   HGB 11.1 (L) 05/22/2016   HCT 35.5 05/22/2016   MCV 86.4 05/22/2016   PLT 235 05/22/2016     Radiographic Findings: No results found.  Impression/Plan: Left breast cancer. Stage IB.  We discussed adjuvant radiotherapy today.  I recommend radiotherapy to the left breast in order to reduce risk of locoregional recurrence by two thirds..  The risks, benefits and side effects of this treatment were discussed in detail.  She  understands that radiotherapy is associated with skin irritation and fatigue in the acute setting. Late effects can include cosmetic changes and rare injury to internal organs.   She is enthusiastic about proceeding with treatment. A consent form has been signed and placed in her chart.  A total of 3 medically necessary complex treatment devices will be fabricated and supervised by me: 2 fields with MLCs for custom blocks to protect heart, and lungs;  and, a Vac-lok. MORE COMPLEX DEVICES MAY BE MADE IN DOSIMETRY FOR FIELD IN FIELD BEAMS FOR DOSE HOMOGENEITY.  I have requested : 3D Simulation which is medically necessary to give adequate dose to at risk tissues while sparing lungs and heart.  I have requested a DVH of the following structures: lungs, heart, and lumpectomy cavity.    The patient will receive 50 Gy in 25 fractions to the left breast with 2 opposed fields.  This will be followed by a boost. Simulation will occur today with DIBH technique.  I spent 25  minutes face to face with the patient and more than 50% of that time was spent in counseling and/or coordination of care. _____________________________________   Eppie Gibson, MD  This document serves as a record of services personally performed by Eppie Gibson, MD. It was created on his behalf by Linward Natal, a trained medical scribe. The creation of this record is based on the scribe's personal observations and the provider's statements to them. This document has been checked and approved by the attending provider.

## 2016-06-03 NOTE — Progress Notes (Signed)
Radiation Oncology         [phone] ________________________________  Name: Virginia [name] MRN: [id number]  Date: 06/03/2016  DOB: 1981-01-03  SIMULATION AND TREATMENT PLANNING NOTE / Special treatment procedure  Outpatient  DIAGNOSIS:     ICD-9-CM ICD-10-CM   1. Malignant neoplasm of upper-inner quadrant of left breast in female, estrogen receptor positive ([place]) 174.2 C50.212    V86.0 Z17.0     NARRATIVE:  The patient was brought to the [facility].  Identity was confirmed.  All relevant records and images related to the planned course of therapy were reviewed.  The patient freely provided informed written consent to proceed with treatment after reviewing the details related to the planned course of therapy. The consent form was witnessed and verified by the simulation staff.    Then, the patient was set-up in a stable reproducible supine position for radiation therapy with her ipsilateral arm over her head, and her upper body secured in a custom-made Vac-lok device.  CT images were obtained.  Surface markings were placed.  The CT images were loaded into the planning software.    Special treatment procedure was performed today due to the extra time and effort required by myself to plan and prepare this patient for deep inspiration breath hold technique.  I have determined cardiac sparing to be of benefit to this patient to prevent long term cardiac damage due to radiation of the heart.  Bellows were placed on the patient's abdomen. To facilitate cardiac sparing, the patient was coached by the radiation therapists on breath hold techniques and breathing practice was performed. Practice waveforms were obtained. The patient was then scanned while maintaining breath hold in the treatment position.  This image was then transferred over to the imaging specialist. The imaging specialist then created a fusion of the free breathing and breath hold scans using the chest wall as  the stable structure. I personally reviewed the fusion in axial, coronal and sagittal image planes.  Excellent cardiac sparing was obtained.  I felt the patient is an appropriate candidate for breath hold and the patient will be treated as such.  The image fusion was then reviewed with the patient to reinforce the necessity of reproducible breath hold.   TREATMENT PLANNING NOTE: Treatment planning then occurred.  The radiation prescription was entered and confirmed.     A total of 3 medically necessary complex treatment devices were fabricated and supervised by me: 2 fields with MLCs for custom blocks to protect heart, and lungs;  and, a Vac-lok. MORE COMPLEX DEVICES MAY BE MADE IN DOSIMETRY FOR FIELD IN FIELD BEAMS FOR DOSE HOMOGENEITY.  I have requested : 3D Simulation which is medically necessary to give adequate dose to at risk tissues while sparing lungs and heart.  I have requested a DVH of the following structures: lungs, heart, lumpectomy cavity.    The patient will receive 50 Gy in 25 fractions to the left breast with 2 tangential fields.  This will be followed by a boost.  Optical Surface Tracking Plan:  Since intensity modulated radiotherapy (IMRT) and 3D conformal radiation treatment methods are predicated on accurate and precise positioning for treatment, intrafraction motion monitoring is medically necessary to ensure accurate and safe treatment delivery. The ability to quantify intrafraction motion without excessive ionizing radiation dose can only be performed with optical surface tracking. Accordingly, surface imaging offers the opportunity to obtain 3D measurements of patient position throughout IMRT and 3D treatments without excessive radiation exposure.  I am ordering optical surface tracking for this patient's upcoming course of radiotherapy.  ________________________________   Reference:  Ursula Alert, J, et al. Surface imaging-based analysis of intrafraction  motion for breast radiotherapy patients.Journal of [place], n. 6, nov. 2014. ISSN [id number].  Available at: <http://www.jacmp.org/index.php/jacmp/article/view/4957>.    -----------------------------------  Eppie Gibson, MD

## 2016-06-04 DIAGNOSIS — Z510 Encounter for antineoplastic radiation therapy: Secondary | ICD-10-CM | POA: Diagnosis not present

## 2016-06-05 ENCOUNTER — Other Ambulatory Visit: Payer: PRIVATE HEALTH INSURANCE

## 2016-06-05 ENCOUNTER — Ambulatory Visit: Payer: PRIVATE HEALTH INSURANCE

## 2016-06-10 ENCOUNTER — Inpatient Hospital Stay: Admission: RE | Admit: 2016-06-10 | Payer: PRIVATE HEALTH INSURANCE | Source: Ambulatory Visit

## 2016-06-10 ENCOUNTER — Ambulatory Visit
Admission: RE | Admit: 2016-06-10 | Discharge: 2016-06-10 | Disposition: A | Discharge status: Home / Self Care | Payer: PRIVATE HEALTH INSURANCE | Source: Ambulatory Visit | Attending: Radiation Oncology | Admitting: Radiation Oncology

## 2016-06-10 DIAGNOSIS — Z510 Encounter for antineoplastic radiation therapy: Secondary | ICD-10-CM | POA: Diagnosis not present

## 2016-06-11 ENCOUNTER — Ambulatory Visit
Admission: RE | Admit: 2016-06-11 | Discharge: 2016-06-11 | Disposition: A | Discharge status: Home / Self Care | Payer: PRIVATE HEALTH INSURANCE | Source: Ambulatory Visit | Attending: Radiation Oncology | Admitting: Radiation Oncology

## 2016-06-11 DIAGNOSIS — C50212 Malignant neoplasm of upper-inner quadrant of left female breast: Secondary | ICD-10-CM

## 2016-06-11 DIAGNOSIS — Z510 Encounter for antineoplastic radiation therapy: Secondary | ICD-10-CM | POA: Diagnosis not present

## 2016-06-11 DIAGNOSIS — Z170 Estrogen receptor positive status [ER+]: Secondary | ICD-10-CM

## 2016-06-11 MED ORDER — RADIAPLEXRX EX GEL
Freq: Once | CUTANEOUS | Status: AC
  Administered 2016-06-11: 15:00:00 via TOPICAL

## 2016-06-11 MED ORDER — ALRA NON-METALLIC DEODORANT (RAD-ONC)
1.0000 "application " | Freq: Once | TOPICAL | Status: AC
  Administered 2016-06-11: 1 via TOPICAL

## 2016-06-11 NOTE — Progress Notes (Signed)

## 2016-06-12 ENCOUNTER — Ambulatory Visit
Admission: RE | Admit: 2016-06-12 | Discharge: 2016-06-12 | Disposition: A | Discharge status: Home / Self Care | Payer: PRIVATE HEALTH INSURANCE | Source: Ambulatory Visit | Attending: Radiation Oncology | Admitting: Radiation Oncology

## 2016-06-12 DIAGNOSIS — Z510 Encounter for antineoplastic radiation therapy: Secondary | ICD-10-CM | POA: Diagnosis not present

## 2016-06-13 ENCOUNTER — Ambulatory Visit (HOSPITAL_BASED_OUTPATIENT_CLINIC_OR_DEPARTMENT_OTHER): Payer: PRIVATE HEALTH INSURANCE

## 2016-06-13 ENCOUNTER — Ambulatory Visit
Admission: RE | Admit: 2016-06-13 | Discharge: 2016-06-13 | Disposition: A | Discharge status: Home / Self Care | Payer: PRIVATE HEALTH INSURANCE | Source: Ambulatory Visit | Attending: Radiation Oncology | Admitting: Radiation Oncology

## 2016-06-13 ENCOUNTER — Ambulatory Visit (HOSPITAL_BASED_OUTPATIENT_CLINIC_OR_DEPARTMENT_OTHER): Payer: PRIVATE HEALTH INSURANCE | Admitting: Oncology

## 2016-06-13 ENCOUNTER — Encounter: Payer: Self-pay | Admitting: Pharmacist

## 2016-06-13 VITALS — BP 118/56 | HR 68 | Temp 98.4°F | Resp 20 | Ht 67.0 in | Wt 197.0 lb

## 2016-06-13 DIAGNOSIS — Z170 Estrogen receptor positive status [ER+]: Secondary | ICD-10-CM

## 2016-06-13 DIAGNOSIS — Z510 Encounter for antineoplastic radiation therapy: Secondary | ICD-10-CM | POA: Diagnosis not present

## 2016-06-13 DIAGNOSIS — Z5112 Encounter for antineoplastic immunotherapy: Secondary | ICD-10-CM | POA: Diagnosis not present

## 2016-06-13 DIAGNOSIS — C50212 Malignant neoplasm of upper-inner quadrant of left female breast: Secondary | ICD-10-CM | POA: Diagnosis not present

## 2016-06-13 MED ORDER — DIPHENHYDRAMINE HCL 25 MG PO CAPS
25.0000 mg | ORAL_CAPSULE | Freq: Once | ORAL | Status: AC
  Administered 2016-06-13: 25 mg via ORAL

## 2016-06-13 MED ORDER — DIPHENHYDRAMINE HCL 25 MG PO CAPS
ORAL_CAPSULE | ORAL | Status: AC
  Filled 2016-06-13: qty 1

## 2016-06-13 MED ORDER — SODIUM CHLORIDE 0.9 % IV SOLN
Freq: Once | INTRAVENOUS | Status: AC
  Administered 2016-06-13: 16:00:00 via INTRAVENOUS

## 2016-06-13 MED ORDER — ACETAMINOPHEN 325 MG PO TABS
ORAL_TABLET | ORAL | Status: AC
  Filled 2016-06-13: qty 2

## 2016-06-13 MED ORDER — TRASTUZUMAB CHEMO 150 MG IV SOLR
6.0000 mg/kg | Freq: Once | INTRAVENOUS | Status: AC
  Administered 2016-06-13: 504 mg via INTRAVENOUS
  Filled 2016-06-13: qty 24

## 2016-06-13 MED ORDER — ACETAMINOPHEN 325 MG PO TABS
650.0000 mg | ORAL_TABLET | Freq: Once | ORAL | Status: AC
  Administered 2016-06-13: 650 mg via ORAL

## 2016-06-13 NOTE — Progress Notes (Signed)
[facility]  [phone] [phone]) [phone]     ID: Virginia [name] DOB: Aug 15, 1980  MR#: [id number]  OOI#:[id number]  Patient Care Team: Virginia Shanks, MD as PCP - General (Family Medicine) Virginia Klein, MD as Consulting Physician (General Surgery) Virginia Cruel, MD as Consulting Physician (Oncology) Virginia Gibson, MD as Attending Physician (Radiation Oncology) Virginia Phlegm, NP as Nurse Practitioner (Hematology and Oncology) Virginia Cruel, MD OTHER MD:  CHIEF COMPLAINT:  Estrogen receptor positive breast cancer  CURRENT TREATMENT:  Adjuvant radiation; continuing anti-HER-2 immunotherapy, goserelin  BREAST CANCER HISTORY: From the original intake note:  Virginia [name] noted some itching on her breasts and since this was the symptom that preceded her grandmother's breast cancer she underwent baseline bilateral screening mammography at [facility] 01/10/2016. The breast density was category B. In the left breast central to the nipple there was a 1.7 cm irregular high density mass with pleomorphic calcifications. Accordingly the patient was brought back 01/14/2016 for left diagnostic mammography and ultrasonography. This confirmed a 0.8 cm irregular mass in the left breast at the 10:00 position. 0.5 cm from the nipple lateral and posterior to that there was a 0.2 cm cluster of amorphous calcifications with an associated density. Anterior to the index mass was a 0.5 cm cluster of amorphous calcifications. All 3 findings taken together 3 cm. Ultrasound of the left breast found a 1 cm irregular mass in the left breast at the 10:00 position retroareolarly. There were no other sonographic abnormalities and the left axilla was sonographically benign  On 01/15/2016 the patient underwent core needle biopsy of the left breast index lesion at 10:00, and this found invasive ductal carcinoma, E-cadherin positive, estrogen receptor 90% positive, progesterone receptor 40%  positive, both with moderate staining intensity, with an MIB-1 of 5%, but HER-2 amplified, with a signals ratio of 2.41 and the number per cell 6.50. One of the 2 areas of abnormal calcifications, the more posterior one measuring 0.2 cm, was also biopsied and showed only atypical ductal hyperplasia (S [id number]).  Her subsequent history is as detailed below  INTERVAL HISTORY: Virginia [name] returns today for follow up of her triple positive breast cancer. She completed her chemotherapy and started radiation 3 days ago. So far she is tolerating that well. She continues on trasntuzumab every 3 weeks, with excellent tolerance. She had a repeat echo 04/04 and it showed a well preserved ejection fraction.  Since her last visit here she also had a period. This was accompanied by migraines, as is usually the case with her.  REVIEW OF SYSTEMS: Virginia [name] has no neuropathy in her feet. The numbness and tinglng in her hands is pretty much gone, except towards the ned of the day, after she's been typing for hours, she has some numbness at the tips of her fingers. This fades overnight. Her hair is barely beginning to come in. She continues to work full time and is exhausted by the time Saturday comes in. She is trying to do some walking. A detailed ROS today was otherwise unremarkable.  PAST MEDICAL HISTORY: Past Medical History:  Diagnosis Date  . Anemia    as a child  . Anxiety   . Cholestasis   . Elevated liver enzymes    during pregnancy March 2015  . Family history of breast cancer   . Family history of prostate cancer   . GERD (gastroesophageal reflux disease)    one episode  . Headache   . Hypothyroidism   .  Malignant neoplasm of upper-inner quadrant of left female breast (Virginia [name]) 01/16/2016    PAST SURGICAL HISTORY: Past Surgical History:  Procedure Laterality Date  . BREAST LUMPECTOMY WITH RADIOACTIVE SEED AND SENTINEL LYMPH NODE BIOPSY Left 03/13/2016   Procedure: LEFT BREAST LUMPECTOMY  WITH RADIOACTIVE SEED X 2 AND SENTINEL LYMPH NODE BIOPSY;  Surgeon: Virginia Klein, MD;  Location: [facility];  Service: General;  Laterality: Left;  . NO PAST SURGERIES    . PORTACATH PLACEMENT Right 03/13/2016   Procedure: INSERTION PORT-A-CATH;  Surgeon: Virginia Klein, MD;  Location: [facility];  Service: General;  Laterality: Right;    FAMILY HISTORY Family History  Problem Relation Age of Onset  . Hypertension Mother   . Hypertension Father   . Prostate cancer Father   . Liver cancer Maternal Grandmother   . Breast cancer Paternal Grandmother     dx in her 24s  . Lung cancer Paternal Aunt     smoker  . Heart attack Paternal Grandfather   . Liver cancer Other   The patient's parents are alive, in their mid 61s as of November 2017. The patient father was diagnosed with prostate cancer at age 24. A paternal aunt was diagnosed with lung cancer in her late 15s. Also the paternal grandmother was diagnosed with breast cancer at the age of 58. On the mother's side there are 2 cases of liver cancer, age of diagnosis unknown.  GYNECOLOGIC HISTORY:  No LMP recorded.  Menarche age 86, first live birth age 69, she is [place] P2. She still having periods, most recently 01/21/2016, but they are no longer regular. She remotely used a number ring and oral contraceptives but stopped in 2016.Currently there use barrier methods for contraception.   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 Her husband Virginia [name] works as a Printmaker for [employer] (for example he worked on our (). Their children are Virginia [name] and Virginia [name],, aged 48 and 2 as of November 2017. Attention and is not a church attender    ADVANCED DIRECTIVES: Not in place   HEALTH MAINTENANCE: Social History  Substance Use Topics  . Smoking status: Never Smoker  . Smokeless tobacco: Never Used  . Alcohol use No     Colonoscopy:  PAP:2016  Bone density:   Allergies  Allergen Reactions    . Amoxicillin Diarrhea and Nausea Only    Has patient had a PCN reaction causing immediate rash, facial/tongue/throat swelling, SOB or lightheadedness with hypotension: No Has patient had a PCN reaction causing severe rash involving mucus membranes or skin necrosis: No Has patient had a PCN reaction that required hospitalization No Has patient had a PCN reaction occurring within the last 10 years: Yes If all of the above answers are "NO", then may proceed with Cephalosporin use.     Current Outpatient Prescriptions  Medication Sig Dispense Refill  . acetaminophen (TYLENOL) 500 MG tablet Take 500-1,000 mg by mouth every 6 (six) hours as needed (for headaches.).    Marland Kitchen fluticasone (FLONASE) 50 MCG/ACT nasal spray Place 1-2 sprays into both nostrils daily as needed for allergies or rhinitis.    Marland Kitchen gabapentin (NEURONTIN) 300 MG capsule Take 1 capsule (300 mg total) by mouth at bedtime. 30 capsule 2  . ibuprofen (ADVIL,MOTRIN) 200 MG tablet Take 400 mg by mouth every 8 (eight) hours as needed (for pain.).    Marland Kitchen lidocaine-prilocaine (EMLA) cream Apply to affected area once 30 g 3  .  LORazepam (ATIVAN) 0.5 MG tablet Take 1 tablet (0.5 mg total) by mouth at bedtime as needed for anxiety. 30 tablet 1  . Multiple Vitamins-Minerals (MULTIVITAMIN GUMMIES ADULT PO) Take 2 tablets by mouth daily.    . ondansetron (ZOFRAN) 8 MG tablet Take 1 tablet (8 mg total) by mouth 2 (two) times daily as needed (Nausea or vomiting). 30 tablet 1  . prochlorperazine (COMPAZINE) 10 MG tablet Take 1 tablet (10 mg total) by mouth every 6 (six) hours as needed (Nausea or vomiting). (Patient not taking: Reported on 06/03/2016) 30 tablet 1   No current facility-administered medications for this visit.     OBJECTIVE: Young African American woman who appears stated age  18:   06/13/16 1404  BP: (!) 118/56  Pulse: 68  Resp: 20  Temp: 98.4 F (36.9 C)     Body mass index is 30.85 kg/m.    ECOG FS:1 - Symptomatic but  completely ambulatory   Sclerae unicteric, pupils round and equal Oropharynx clear and moist No cervical or supraclavicular adenopathy Lungs no rales or rhonchi Heart regular rate and rhythm Abd soft, nontender, positive bowel sounds MSK no focal spinal tenderness, no upper extremity lymphedema Neuro: nonfocal, well oriented, appropriate affect Breasts: The right brest is unremarkable. The left breast is s/p lumpectomy and is currently receving radiaiton. There is no evidnece of residual or recurrent disease. Both axillae are bening  Breasts: Deferred  LAB RESULTS:  CMP     Component Value Date/Time   NA 141 05/22/2016 0835   K 4.0 05/22/2016 0835   CL 107 03/07/2016 1612   CO2 25 05/22/2016 0835   GLUCOSE 91 05/22/2016 0835   BUN 8.2 05/22/2016 0835   CREATININE 0.8 05/22/2016 0835   CALCIUM 9.3 05/22/2016 0835   PROT 7.2 05/22/2016 0835   ALBUMIN 4.0 05/22/2016 0835   AST 18 05/22/2016 0835   ALT 21 05/22/2016 0835   ALKPHOS 66 05/22/2016 0835   BILITOT 0.29 05/22/2016 0835   GFRNONAA >60 03/07/2016 1612   GFRAA >60 03/07/2016 1612    INo results found for: SPEP, UPEP  Lab Results  Component Value Date   WBC 5.3 05/22/2016   NEUTROABS 2.0 05/22/2016   HGB 11.1 (L) 05/22/2016   HCT 35.5 05/22/2016   MCV 86.4 05/22/2016   PLT 235 05/22/2016      Chemistry      Component Value Date/Time   NA 141 05/22/2016 0835   K 4.0 05/22/2016 0835   CL 107 03/07/2016 1612   CO2 25 05/22/2016 0835   BUN 8.2 05/22/2016 0835   CREATININE 0.8 05/22/2016 0835      Component Value Date/Time   CALCIUM 9.3 05/22/2016 0835   ALKPHOS 66 05/22/2016 0835   AST 18 05/22/2016 0835   ALT 21 05/22/2016 0835   BILITOT 0.29 05/22/2016 0835       No results found for: LABCA2  No components found for: LABCA125  No results for input(s): INR in the last 168 hours.  Urinalysis    Component Value Date/Time   COLORURINE YELLOW 10/14/2012 1954   APPEARANCEUR CLEAR 10/14/2012  1954   LABSPEC >1.030 (H) 10/14/2012 1954   PHURINE 6.0 10/14/2012 1954   GLUCOSEU NEGATIVE 10/14/2012 1954   HGBUR TRACE (A) 10/14/2012 1954   BILIRUBINUR NEGATIVE 10/14/2012 1954   KETONESUR 15 (A) 10/14/2012 1954   PROTEINUR NEGATIVE 10/14/2012 1954   UROBILINOGEN 0.2 10/14/2012 1954   NITRITE NEGATIVE 10/14/2012 [place] NEGATIVE 10/14/2012 1954  STUDIES: No results found.   ELIGIBLE FOR AVAILABLE RESEARCH PROTOCOL: no  ASSESSMENT: 36 y.o. [place] woman status post left breast upper inner quadrant biopsy 01/15/2016 for a clinical T1c N0, stage IA invasive ductal carcinoma, E-cadherin positive, estrogen receptor 90% positive, progesterone receptor 40% positive, both with moderate staining intensity, with an MIB-1 of 5%, but HER-2 amplified, with a signals ratio of 2.41 and the number per cell 6.50.  (a) One of 2 areas of abnormal calcifications, the more posterior one measuring 0.2 cm, was also biopsied and showed only atypical ductal hyperplasia  (b) a 4.1 cm area of non-masslike enhancement in the left breast underwent biopsy 02/19/2016 showing only fibrocystic changes  (1) tamoxifen started 01/23/2016 neoadjuvantly  (2) genetics testing 02/22/2016 through the Breast/Ovarian gene panel offered by [facility] found no deleterious mutations in  ATM, BARD1, BRCA1, BRCA2, BRIP1, CDH1, CHEK2, EPCAM, FANCC, MLH1, MSH2, MSH6, NBN, PALB2, PMS2, PTEN, RAD51C, RAD51D, TP53, and XRCC2.     (3) status post left lumpectomy and sentinel lymph node sampling 03/13/2016 for an mpT1c pN0, stage IA invasive ductal carcinoma, grade 2, with negative margins.  (4) paclitaxel weekly 12 and trastuzumab every 21 days beginning on 04/10/2016.  (a) paclitaxel discontinued after 5 doses because of atypical neuropathy symptoms. Last dose 04/28/2016  (5) trastuzumab to be continued to complete a year--through February 2019  (a) echocardiogram 02/11/2016 shows ejection fraction in the 60-65%  range.  (b) echo 05/28/2916 shoe\ws EF in the 65-70% range  (6) adjuvant radiation to be completed 07/23/2016  (7) tamoxifen to resume at the completion of local treatment, to be continued for 5-10 years  (8) patient resumed menses post chemo, April 2018  (a) goserelin started 06/17/2016  PLAN: Virginia [name] has largely recoevered from her cheotherapy though she still has minimal neuropathy symptoms (possible work related) and some fatigue. She is tolerating radiaiton well so far and understands she will have hyperpigmentation, possibly peeling, and fatigue as the treatments proceed.  We are continuing with trastuzumasvb, which she is tolerating well. We reviewed her recent echo which shows an excellent ejection fraction.  As her periods have resumed, we discussed goserelin. We reviewed results of the study showing patients under 26 y/o like her who do not become menopausal with chemotherapy do better with ovarian suppression in additon to anti estrogens. We discussed the possible toxicities, side effects and complications of goserelin. She is agreeabkle to this and we are starting those shots next week.  She will see me again June 1, after she completes her chemotherapy, and we will consider tamoxifen vs anastrozole at that time.  She knows to cll for any problems that may develop before then  Virginia Cruel, MD   06/13/2016 2:13 PM Medical Oncology and Hematology [facility] [place], [place] [address] Tel. [phone]    Fax. [phone]

## 2016-06-13 NOTE — Addendum Note (Signed)
Addended by: Chauncey Cruel on: 06/13/2016 04:16 PM   Modules accepted: Orders

## 2016-06-13 NOTE — Progress Notes (Signed)
Consent documentation  Study code: [id number]  Met with patient during her infusion appointment on 06/13/16 at 3:00. Patient was alone for this visit. Provided an overview of the "Pharmacogenetic analysis of toxicities related to administration of taxanes in breast cancer patients" study.  Consent form was reviewed with the patient (reviewed the study purpose, patient's role, possible side effects, cost, risk, information protection) All of the patient's questions were answered and she agreed to participate in the study. A signed copy of the consent form was given to the patient. All eligibility criteria have been met and patient has been enrolled in the study.  Study sample was collected via buccal swab after consent was obtained. Patient was informed that the sample would be sent to the lab for processing after samples were collected from 25 patients and that it would take approximately 2 weeks for the lab to process that sample.   Met with the patient for approximately 15 minutes.  Darl Pikes, PharmD, [place] Clinical Pharmacist- Oncology Pharmacy Resident

## 2016-06-16 ENCOUNTER — Ambulatory Visit
Admission: RE | Admit: 2016-06-16 | Discharge: 2016-06-16 | Disposition: A | Discharge status: Home / Self Care | Payer: PRIVATE HEALTH INSURANCE | Source: Ambulatory Visit | Attending: Radiation Oncology | Admitting: Radiation Oncology

## 2016-06-16 DIAGNOSIS — Z510 Encounter for antineoplastic radiation therapy: Secondary | ICD-10-CM | POA: Diagnosis not present

## 2016-06-17 ENCOUNTER — Ambulatory Visit (HOSPITAL_BASED_OUTPATIENT_CLINIC_OR_DEPARTMENT_OTHER): Payer: PRIVATE HEALTH INSURANCE

## 2016-06-17 ENCOUNTER — Ambulatory Visit
Admission: RE | Admit: 2016-06-17 | Discharge: 2016-06-17 | Disposition: A | Discharge status: Home / Self Care | Payer: PRIVATE HEALTH INSURANCE | Source: Ambulatory Visit | Attending: Radiation Oncology | Admitting: Radiation Oncology

## 2016-06-17 VITALS — BP 141/81 | HR 73 | Temp 98.6°F | Resp 18

## 2016-06-17 DIAGNOSIS — Z5111 Encounter for antineoplastic chemotherapy: Secondary | ICD-10-CM | POA: Diagnosis not present

## 2016-06-17 DIAGNOSIS — Z510 Encounter for antineoplastic radiation therapy: Secondary | ICD-10-CM | POA: Diagnosis not present

## 2016-06-17 DIAGNOSIS — C50212 Malignant neoplasm of upper-inner quadrant of left female breast: Secondary | ICD-10-CM | POA: Diagnosis not present

## 2016-06-17 DIAGNOSIS — Z170 Estrogen receptor positive status [ER+]: Secondary | ICD-10-CM

## 2016-06-17 MED ORDER — GOSERELIN ACETATE 3.6 MG ~~LOC~~ IMPL
3.6000 mg | DRUG_IMPLANT | Freq: Once | SUBCUTANEOUS | Status: AC
  Administered 2016-06-17: 3.6 mg via SUBCUTANEOUS
  Filled 2016-06-17: qty 3.6

## 2016-06-17 NOTE — Patient Instructions (Signed)
Goserelin injection What is this medicine? GOSERELIN (GOE se rel in) is similar to a hormone found in the body. It lowers the amount of sex hormones that the body makes. Men will have lower testosterone levels and women will have lower estrogen levels while taking this medicine. In men, this medicine is used to treat prostate cancer; the injection is either given once per month or once every 12 weeks. A once per month injection (only) is used to treat women with endometriosis, dysfunctional uterine bleeding, or advanced breast cancer. This medicine may be used for other purposes; ask your health care provider or pharmacist if you have questions. COMMON BRAND NAME(S): Zoladex What should I tell my health care provider before I take this medicine? They need to know if you have any of these conditions (some only apply to women): -diabetes -heart disease or previous heart attack -high blood pressure -high cholesterol -kidney disease -osteoporosis or low bone density -problems passing urine -spinal cord injury -stroke -tobacco smoker -an unusual or allergic reaction to goserelin, hormone therapy, other medicines, foods, dyes, or preservatives -pregnant or trying to get pregnant -breast-feeding How should I use this medicine? This medicine is for injection under the skin. It is given by a health care professional in a hospital or clinic setting. Men receive this injection once every 4 weeks or once every 12 weeks. Women will only receive the once every 4 weeks injection. Talk to your pediatrician regarding the use of this medicine in children. Special care may be needed. Overdosage: If you think you have taken too much of this medicine contact a poison control center or emergency room at once. NOTE: This medicine is only for you. Do not share this medicine with others. What if I miss a dose? It is important not to miss your dose. Call your doctor or health care professional if you are unable to  keep an appointment. What may interact with this medicine? -female hormones like estrogen -herbal or dietary supplements like black cohosh, chasteberry, or DHEA -female hormones like testosterone -prasterone This list may not describe all possible interactions. Give your health care provider a list of all the medicines, herbs, non-prescription drugs, or dietary supplements you use. Also tell them if you smoke, drink alcohol, or use illegal drugs. Some items may interact with your medicine. What should I watch for while using this medicine? Visit your doctor or health care professional for regular checks on your progress. Your symptoms may appear to get worse during the first weeks of this therapy. Tell your doctor or healthcare professional if your symptoms do not start to get better or if they get worse after this time. Your bones may get weaker if you take this medicine for a long time. If you smoke or frequently drink alcohol you may increase your risk of bone loss. A family history of osteoporosis, chronic use of drugs for seizures (convulsions), or corticosteroids can also increase your risk of bone loss. Talk to your doctor about how to keep your bones strong. This medicine should stop regular monthly menstration in women. Tell your doctor if you continue to menstrate. Women should not become pregnant while taking this medicine or for 12 weeks after stopping this medicine. Women should inform their doctor if they wish to become pregnant or think they might be pregnant. There is a potential for serious side effects to an unborn child. Talk to your health care professional or pharmacist for more information. Do not breast-feed an infant while taking   this medicine. Men should inform their doctors if they wish to father a child. This medicine may lower sperm counts. Talk to your health care professional or pharmacist for more information. What side effects may I notice from receiving this  medicine? Side effects that you should report to your doctor or health care professional as soon as possible: -allergic reactions like skin rash, itching or hives, swelling of the face, lips, or tongue -bone pain -breathing problems -changes in vision -chest pain -feeling faint or lightheaded, falls -fever, chills -pain, swelling, warmth in the leg -pain, tingling, numbness in the hands or feet -signs and symptoms of low blood pressure like dizziness; feeling faint or lightheaded, falls; unusually weak or tired -stomach pain -swelling of the ankles, feet, hands -trouble passing urine or change in the amount of urine -unusually high or low blood pressure -unusually weak or tired Side effects that usually do not require medical attention (report to your doctor or health care professional if they continue or are bothersome): -change in sex drive or performance -changes in breast size in both males and females -changes in emotions or moods -headache -hot flashes -irritation at site where injected -loss of appetite -skin problems like acne, dry skin -vaginal dryness This list may not describe all possible side effects. Call your doctor for medical advice about side effects. You may report side effects to FDA at 1-800-FDA-1088. Where should I keep my medicine? This drug is given in a hospital or clinic and will not be stored at home. NOTE: This sheet is a summary. It may not cover all possible information. If you have questions about this medicine, talk to your doctor, pharmacist, or health care provider.  2018 [employer] Standard (2013-04-19 11:10:35)  

## 2016-06-18 ENCOUNTER — Ambulatory Visit
Admission: RE | Admit: 2016-06-18 | Discharge: 2016-06-18 | Disposition: A | Discharge status: Home / Self Care | Payer: PRIVATE HEALTH INSURANCE | Source: Ambulatory Visit | Attending: Radiation Oncology | Admitting: Radiation Oncology

## 2016-06-18 DIAGNOSIS — Z510 Encounter for antineoplastic radiation therapy: Secondary | ICD-10-CM | POA: Diagnosis not present

## 2016-06-19 ENCOUNTER — Ambulatory Visit
Admission: RE | Admit: 2016-06-19 | Discharge: 2016-06-19 | Disposition: A | Discharge status: Home / Self Care | Payer: PRIVATE HEALTH INSURANCE | Source: Ambulatory Visit | Attending: Radiation Oncology | Admitting: Radiation Oncology

## 2016-06-19 DIAGNOSIS — Z510 Encounter for antineoplastic radiation therapy: Secondary | ICD-10-CM | POA: Diagnosis not present

## 2016-06-20 ENCOUNTER — Ambulatory Visit
Admission: RE | Admit: 2016-06-20 | Discharge: 2016-06-20 | Disposition: A | Discharge status: Home / Self Care | Payer: PRIVATE HEALTH INSURANCE | Source: Ambulatory Visit | Attending: Radiation Oncology | Admitting: Radiation Oncology

## 2016-06-20 DIAGNOSIS — Z510 Encounter for antineoplastic radiation therapy: Secondary | ICD-10-CM | POA: Diagnosis not present

## 2016-06-23 ENCOUNTER — Ambulatory Visit
Admission: RE | Admit: 2016-06-23 | Discharge: 2016-06-23 | Disposition: A | Discharge status: Home / Self Care | Payer: PRIVATE HEALTH INSURANCE | Source: Ambulatory Visit | Attending: Radiation Oncology | Admitting: Radiation Oncology

## 2016-06-23 DIAGNOSIS — Z510 Encounter for antineoplastic radiation therapy: Secondary | ICD-10-CM | POA: Diagnosis not present

## 2016-06-24 ENCOUNTER — Ambulatory Visit
Admission: RE | Admit: 2016-06-24 | Discharge: 2016-06-24 | Disposition: A | Discharge status: Home / Self Care | Payer: PRIVATE HEALTH INSURANCE | Source: Ambulatory Visit | Attending: Radiation Oncology | Admitting: Radiation Oncology

## 2016-06-24 DIAGNOSIS — Z510 Encounter for antineoplastic radiation therapy: Secondary | ICD-10-CM | POA: Diagnosis not present

## 2016-06-25 ENCOUNTER — Ambulatory Visit
Admission: RE | Admit: 2016-06-25 | Discharge: 2016-06-25 | Disposition: A | Discharge status: Home / Self Care | Payer: PRIVATE HEALTH INSURANCE | Source: Ambulatory Visit | Attending: Radiation Oncology | Admitting: Radiation Oncology

## 2016-06-25 DIAGNOSIS — Z510 Encounter for antineoplastic radiation therapy: Secondary | ICD-10-CM | POA: Diagnosis not present

## 2016-06-26 ENCOUNTER — Ambulatory Visit
Admission: RE | Admit: 2016-06-26 | Discharge: 2016-06-26 | Disposition: A | Discharge status: Home / Self Care | Payer: PRIVATE HEALTH INSURANCE | Source: Ambulatory Visit | Attending: Radiation Oncology | Admitting: Radiation Oncology

## 2016-06-26 DIAGNOSIS — Z510 Encounter for antineoplastic radiation therapy: Secondary | ICD-10-CM | POA: Diagnosis not present

## 2016-06-27 ENCOUNTER — Ambulatory Visit
Admission: RE | Admit: 2016-06-27 | Discharge: 2016-06-27 | Disposition: A | Discharge status: Home / Self Care | Payer: PRIVATE HEALTH INSURANCE | Source: Ambulatory Visit | Attending: Radiation Oncology | Admitting: Radiation Oncology

## 2016-06-27 DIAGNOSIS — Z510 Encounter for antineoplastic radiation therapy: Secondary | ICD-10-CM | POA: Diagnosis not present

## 2016-06-30 ENCOUNTER — Ambulatory Visit
Admission: RE | Admit: 2016-06-30 | Discharge: 2016-06-30 | Disposition: A | Discharge status: Home / Self Care | Payer: PRIVATE HEALTH INSURANCE | Source: Ambulatory Visit | Attending: Radiation Oncology | Admitting: Radiation Oncology

## 2016-06-30 DIAGNOSIS — Z510 Encounter for antineoplastic radiation therapy: Secondary | ICD-10-CM | POA: Diagnosis not present

## 2016-07-01 ENCOUNTER — Ambulatory Visit
Admission: RE | Admit: 2016-07-01 | Discharge: 2016-07-01 | Disposition: A | Discharge status: Home / Self Care | Payer: PRIVATE HEALTH INSURANCE | Source: Ambulatory Visit | Attending: Radiation Oncology | Admitting: Radiation Oncology

## 2016-07-01 DIAGNOSIS — Z510 Encounter for antineoplastic radiation therapy: Secondary | ICD-10-CM | POA: Diagnosis not present

## 2016-07-02 ENCOUNTER — Ambulatory Visit
Admission: RE | Admit: 2016-07-02 | Discharge: 2016-07-02 | Disposition: A | Discharge status: Home / Self Care | Payer: PRIVATE HEALTH INSURANCE | Source: Ambulatory Visit | Attending: Radiation Oncology | Admitting: Radiation Oncology

## 2016-07-02 DIAGNOSIS — Z510 Encounter for antineoplastic radiation therapy: Secondary | ICD-10-CM | POA: Diagnosis not present

## 2016-07-03 ENCOUNTER — Ambulatory Visit
Admission: RE | Admit: 2016-07-03 | Discharge: 2016-07-03 | Disposition: A | Discharge status: Home / Self Care | Payer: PRIVATE HEALTH INSURANCE | Source: Ambulatory Visit | Attending: Radiation Oncology | Admitting: Radiation Oncology

## 2016-07-03 DIAGNOSIS — Z510 Encounter for antineoplastic radiation therapy: Secondary | ICD-10-CM | POA: Diagnosis not present

## 2016-07-04 ENCOUNTER — Other Ambulatory Visit (HOSPITAL_BASED_OUTPATIENT_CLINIC_OR_DEPARTMENT_OTHER): Payer: PRIVATE HEALTH INSURANCE

## 2016-07-04 ENCOUNTER — Ambulatory Visit (HOSPITAL_BASED_OUTPATIENT_CLINIC_OR_DEPARTMENT_OTHER): Payer: PRIVATE HEALTH INSURANCE

## 2016-07-04 ENCOUNTER — Other Ambulatory Visit: Payer: Self-pay | Admitting: Hematology

## 2016-07-04 ENCOUNTER — Ambulatory Visit
Admission: RE | Admit: 2016-07-04 | Discharge: 2016-07-04 | Disposition: A | Discharge status: Home / Self Care | Payer: PRIVATE HEALTH INSURANCE | Source: Ambulatory Visit | Attending: Radiation Oncology | Admitting: Radiation Oncology

## 2016-07-04 VITALS — BP 115/83 | HR 57 | Temp 98.3°F | Resp 18

## 2016-07-04 DIAGNOSIS — Z170 Estrogen receptor positive status [ER+]: Secondary | ICD-10-CM

## 2016-07-04 DIAGNOSIS — Z5112 Encounter for antineoplastic immunotherapy: Secondary | ICD-10-CM | POA: Diagnosis not present

## 2016-07-04 DIAGNOSIS — C50212 Malignant neoplasm of upper-inner quadrant of left female breast: Secondary | ICD-10-CM

## 2016-07-04 DIAGNOSIS — Z510 Encounter for antineoplastic radiation therapy: Secondary | ICD-10-CM | POA: Diagnosis not present

## 2016-07-04 LAB — CBC WITH DIFFERENTIAL/PLATELET
BASO%: 0.9 % (ref 0.0–2.0)
Basophils Absolute: 0 10*3/uL (ref 0.0–0.1)
EOS%: 1.9 % (ref 0.0–7.0)
Eosinophils Absolute: 0.1 10*3/uL (ref 0.0–0.5)
HCT: 33.8 % — ABNORMAL LOW (ref 34.8–46.6)
HGB: 10.6 g/dL — ABNORMAL LOW (ref 11.6–15.9)
LYMPH%: 24.2 % (ref 14.0–49.7)
MCH: 26.5 pg (ref 25.1–34.0)
MCHC: 31.5 g/dL (ref 31.5–36.0)
MCV: 84.2 fL (ref 79.5–101.0)
MONO#: 0.5 10*3/uL (ref 0.1–0.9)
MONO%: 13.7 % (ref 0.0–14.0)
NEUT#: 2.3 10*3/uL (ref 1.5–6.5)
NEUT%: 59.3 % (ref 38.4–76.8)
Platelets: 195 10*3/uL (ref 145–400)
RBC: 4.01 10*6/uL (ref 3.70–5.45)
RDW: 13.6 % (ref 11.2–14.5)
WBC: 3.8 10*3/uL — ABNORMAL LOW (ref 3.9–10.3)
lymph#: 0.9 10*3/uL (ref 0.9–3.3)

## 2016-07-04 LAB — COMPREHENSIVE METABOLIC PANEL
ALT: 18 U/L (ref 0–55)
AST: 19 U/L (ref 5–34)
Albumin: 4.1 g/dL (ref 3.5–5.0)
Alkaline Phosphatase: 63 U/L (ref 40–150)
Anion Gap: 9 mEq/L (ref 3–11)
BUN: 7.3 mg/dL (ref 7.0–26.0)
CO2: 26 mEq/L (ref 22–29)
Calcium: 9.4 mg/dL (ref 8.4–10.4)
Chloride: 107 mEq/L (ref 98–109)
Creatinine: 0.8 mg/dL (ref 0.6–1.1)
EGFR: >90 mL/min/{1.73_m2} (ref >90)
Glucose: 91 mg/dl (ref 70–140)
Potassium: 3.8 mEq/L (ref 3.5–5.1)
Sodium: 142 mEq/L (ref 136–145)
Total Bilirubin: <0.22 mg/dL (ref 0.20–1.20)
Total Protein: 7.3 g/dL (ref 6.4–8.3)

## 2016-07-04 MED ORDER — TRASTUZUMAB CHEMO 150 MG IV SOLR
6.0000 mg/kg | Freq: Once | INTRAVENOUS | Status: AC
  Administered 2016-07-04: 504 mg via INTRAVENOUS
  Filled 2016-07-04: qty 24

## 2016-07-04 MED ORDER — DIPHENHYDRAMINE HCL 25 MG PO CAPS
25.0000 mg | ORAL_CAPSULE | Freq: Once | ORAL | Status: AC
  Administered 2016-07-04: 25 mg via ORAL

## 2016-07-04 MED ORDER — SODIUM CHLORIDE 0.9% FLUSH
10.0000 mL | INTRAVENOUS | Status: DC | PRN
  Administered 2016-07-04: 10 mL
  Filled 2016-07-04: qty 10

## 2016-07-04 MED ORDER — ACETAMINOPHEN 325 MG PO TABS
ORAL_TABLET | ORAL | Status: AC
  Filled 2016-07-04: qty 2

## 2016-07-04 MED ORDER — HEPARIN SOD (PORK) LOCK FLUSH 100 UNIT/ML IV SOLN
500.0000 [IU] | Freq: Once | INTRAVENOUS | Status: AC | PRN
  Administered 2016-07-04: 500 [IU]
  Filled 2016-07-04: qty 5

## 2016-07-04 MED ORDER — SODIUM CHLORIDE 0.9 % IV SOLN
Freq: Once | INTRAVENOUS | Status: AC
  Administered 2016-07-04: 16:00:00 via INTRAVENOUS

## 2016-07-04 MED ORDER — DIPHENHYDRAMINE HCL 25 MG PO CAPS
ORAL_CAPSULE | ORAL | Status: AC
  Filled 2016-07-04: qty 1

## 2016-07-04 MED ORDER — ACETAMINOPHEN 325 MG PO TABS
650.0000 mg | ORAL_TABLET | Freq: Once | ORAL | Status: AC
  Administered 2016-07-04: 650 mg via ORAL

## 2016-07-07 ENCOUNTER — Ambulatory Visit
Admission: RE | Admit: 2016-07-07 | Discharge: 2016-07-07 | Disposition: A | Discharge status: Home / Self Care | Payer: PRIVATE HEALTH INSURANCE | Source: Ambulatory Visit | Attending: Radiation Oncology | Admitting: Radiation Oncology

## 2016-07-07 DIAGNOSIS — Z510 Encounter for antineoplastic radiation therapy: Secondary | ICD-10-CM | POA: Diagnosis not present

## 2016-07-08 ENCOUNTER — Ambulatory Visit
Admission: RE | Admit: 2016-07-08 | Discharge: 2016-07-08 | Disposition: A | Discharge status: Home / Self Care | Payer: PRIVATE HEALTH INSURANCE | Source: Ambulatory Visit | Attending: Radiation Oncology | Admitting: Radiation Oncology

## 2016-07-08 DIAGNOSIS — Z510 Encounter for antineoplastic radiation therapy: Secondary | ICD-10-CM | POA: Diagnosis not present

## 2016-07-09 ENCOUNTER — Ambulatory Visit
Admission: RE | Admit: 2016-07-09 | Discharge: 2016-07-09 | Disposition: A | Discharge status: Home / Self Care | Payer: PRIVATE HEALTH INSURANCE | Source: Ambulatory Visit | Attending: Radiation Oncology | Admitting: Radiation Oncology

## 2016-07-09 DIAGNOSIS — Z510 Encounter for antineoplastic radiation therapy: Secondary | ICD-10-CM | POA: Diagnosis not present

## 2016-07-10 ENCOUNTER — Ambulatory Visit
Admission: RE | Admit: 2016-07-10 | Discharge: 2016-07-10 | Disposition: A | Discharge status: Home / Self Care | Payer: PRIVATE HEALTH INSURANCE | Source: Ambulatory Visit | Attending: Radiation Oncology | Admitting: Radiation Oncology

## 2016-07-10 DIAGNOSIS — Z510 Encounter for antineoplastic radiation therapy: Secondary | ICD-10-CM | POA: Diagnosis not present

## 2016-07-11 ENCOUNTER — Ambulatory Visit: Payer: PRIVATE HEALTH INSURANCE | Admitting: Radiation Oncology

## 2016-07-11 ENCOUNTER — Ambulatory Visit
Admission: RE | Admit: 2016-07-11 | Discharge: 2016-07-11 | Disposition: A | Discharge status: Home / Self Care | Payer: PRIVATE HEALTH INSURANCE | Source: Ambulatory Visit | Attending: Radiation Oncology | Admitting: Radiation Oncology

## 2016-07-11 DIAGNOSIS — Z510 Encounter for antineoplastic radiation therapy: Secondary | ICD-10-CM | POA: Diagnosis not present

## 2016-07-14 ENCOUNTER — Encounter: Payer: Self-pay | Admitting: Radiation Oncology

## 2016-07-14 ENCOUNTER — Ambulatory Visit
Admission: RE | Admit: 2016-07-14 | Discharge: 2016-07-14 | Disposition: A | Discharge status: Home / Self Care | Payer: PRIVATE HEALTH INSURANCE | Source: Ambulatory Visit | Attending: Radiation Oncology | Admitting: Radiation Oncology

## 2016-07-14 VITALS — BP 128/76 | HR 71 | Temp 98.4°F | Ht 67.0 in | Wt 194.8 lb

## 2016-07-14 DIAGNOSIS — Z170 Estrogen receptor positive status [ER+]: Secondary | ICD-10-CM

## 2016-07-14 DIAGNOSIS — C50212 Malignant neoplasm of upper-inner quadrant of left female breast: Secondary | ICD-10-CM

## 2016-07-14 DIAGNOSIS — Z510 Encounter for antineoplastic radiation therapy: Secondary | ICD-10-CM | POA: Diagnosis not present

## 2016-07-14 NOTE — Progress Notes (Signed)
   Weekly Management Note:  Outpatient    ICD-9-CM ICD-10-CM   1. Malignant neoplasm of upper-inner quadrant of left breast in female, estrogen receptor positive (HCC) 174.2 C50.212    V86.0 Z17.0     Current Dose:  48 Gy  Projected Dose: 60 Gy   Narrative:  The patient presents for routine under treatment assessment.  CBCT/MVCT images/Port film x-rays were reviewed.  The chart was checked. Doing well, tired  Physical Findings:  height is 5\' 7"  (1.702 m) and weight is 194 lb 12.8 oz (88.4 kg). Her temperature is 98.4 F (36.9 C). Her blood pressure is 128/76 and her pulse is 71. Her oxygen saturation is 99%.   Wt Readings from Last 3 Encounters:  07/14/16 194 lb 12.8 oz (88.4 kg)  06/13/16 197 lb (89.4 kg)  06/03/16 198 lb 12.8 oz (90.2 kg)   Hyperpigmentation over left breast, skin intact.  Impression:  The patient is tolerating radiotherapy.  Plan:  Continue radiotherapy as planned. Patient instructed to apply Radiplex to intact skin in treatment fields.      ________________________________   Eppie Gibson, M.D.

## 2016-07-14 NOTE — Progress Notes (Signed)
Ms. Swilling presents for her 24th fraction of radiation to her Left Breast. She reports pain a 4/10 to her Left Breast. She is taking Tylenol 500 mg tablets with some relief. She does have fatigue, especially by mid week. Her Left Breast is hyperpigmented with soreness noted below her nipple area. She is applying radiaplex twice daily.   BP 128/76   Pulse 71   Temp 98.4 F (36.9 C)   Ht 5\' 7"  (1.702 m)   Wt 194 lb 12.8 oz (88.4 kg)   SpO2 99% Comment: room air  BMI 30.51 kg/m    07/08/16 197.4 lb 07/14/16 194.8 lb

## 2016-07-15 ENCOUNTER — Ambulatory Visit (HOSPITAL_BASED_OUTPATIENT_CLINIC_OR_DEPARTMENT_OTHER): Payer: PRIVATE HEALTH INSURANCE

## 2016-07-15 ENCOUNTER — Ambulatory Visit
Admission: RE | Admit: 2016-07-15 | Discharge: 2016-07-15 | Disposition: A | Discharge status: Home / Self Care | Payer: PRIVATE HEALTH INSURANCE | Source: Ambulatory Visit | Attending: Radiation Oncology | Admitting: Radiation Oncology

## 2016-07-15 VITALS — BP 133/78 | HR 75 | Temp 98.1°F | Resp 20

## 2016-07-15 DIAGNOSIS — C50212 Malignant neoplasm of upper-inner quadrant of left female breast: Secondary | ICD-10-CM

## 2016-07-15 DIAGNOSIS — Z5111 Encounter for antineoplastic chemotherapy: Secondary | ICD-10-CM

## 2016-07-15 DIAGNOSIS — Z170 Estrogen receptor positive status [ER+]: Secondary | ICD-10-CM

## 2016-07-15 DIAGNOSIS — Z510 Encounter for antineoplastic radiation therapy: Secondary | ICD-10-CM | POA: Diagnosis not present

## 2016-07-15 MED ORDER — GOSERELIN ACETATE 3.6 MG ~~LOC~~ IMPL
3.6000 mg | DRUG_IMPLANT | Freq: Once | SUBCUTANEOUS | Status: AC
  Administered 2016-07-15: 3.6 mg via SUBCUTANEOUS
  Filled 2016-07-15: qty 3.6

## 2016-07-15 NOTE — Patient Instructions (Signed)
Goserelin injection What is this medicine? GOSERELIN (GOE se rel in) is similar to a hormone found in the body. It lowers the amount of sex hormones that the body makes. Men will have lower testosterone levels and women will have lower estrogen levels while taking this medicine. In men, this medicine is used to treat prostate cancer; the injection is either given once per month or once every 12 weeks. A once per month injection (only) is used to treat women with endometriosis, dysfunctional uterine bleeding, or advanced breast cancer. This medicine may be used for other purposes; ask your health care provider or pharmacist if you have questions. COMMON BRAND NAME(S): Zoladex What should I tell my health care provider before I take this medicine? They need to know if you have any of these conditions (some only apply to women): -diabetes -heart disease or previous heart attack -high blood pressure -high cholesterol -kidney disease -osteoporosis or low bone density -problems passing urine -spinal cord injury -stroke -tobacco smoker -an unusual or allergic reaction to goserelin, hormone therapy, other medicines, foods, dyes, or preservatives -pregnant or trying to get pregnant -breast-feeding How should I use this medicine? This medicine is for injection under the skin. It is given by a health care professional in a hospital or clinic setting. Men receive this injection once every 4 weeks or once every 12 weeks. Women will only receive the once every 4 weeks injection. Talk to your pediatrician regarding the use of this medicine in children. Special care may be needed. Overdosage: If you think you have taken too much of this medicine contact a poison control center or emergency room at once. NOTE: This medicine is only for you. Do not share this medicine with others. What if I miss a dose? It is important not to miss your dose. Call your doctor or health care professional if you are unable to  keep an appointment. What may interact with this medicine? -female hormones like estrogen -herbal or dietary supplements like black cohosh, chasteberry, or DHEA -female hormones like testosterone -prasterone This list may not describe all possible interactions. Give your health care provider a list of all the medicines, herbs, non-prescription drugs, or dietary supplements you use. Also tell them if you smoke, drink alcohol, or use illegal drugs. Some items may interact with your medicine. What should I watch for while using this medicine? Visit your doctor or health care professional for regular checks on your progress. Your symptoms may appear to get worse during the first weeks of this therapy. Tell your doctor or healthcare professional if your symptoms do not start to get better or if they get worse after this time. Your bones may get weaker if you take this medicine for a long time. If you smoke or frequently drink alcohol you may increase your risk of bone loss. A family history of osteoporosis, chronic use of drugs for seizures (convulsions), or corticosteroids can also increase your risk of bone loss. Talk to your doctor about how to keep your bones strong. This medicine should stop regular monthly menstration in women. Tell your doctor if you continue to menstrate. Women should not become pregnant while taking this medicine or for 12 weeks after stopping this medicine. Women should inform their doctor if they wish to become pregnant or think they might be pregnant. There is a potential for serious side effects to an unborn child. Talk to your health care professional or pharmacist for more information. Do not breast-feed an infant while taking   this medicine. Men should inform their doctors if they wish to father a child. This medicine may lower sperm counts. Talk to your health care professional or pharmacist for more information. What side effects may I notice from receiving this  medicine? Side effects that you should report to your doctor or health care professional as soon as possible: -allergic reactions like skin rash, itching or hives, swelling of the face, lips, or tongue -bone pain -breathing problems -changes in vision -chest pain -feeling faint or lightheaded, falls -fever, chills -pain, swelling, warmth in the leg -pain, tingling, numbness in the hands or feet -signs and symptoms of low blood pressure like dizziness; feeling faint or lightheaded, falls; unusually weak or tired -stomach pain -swelling of the ankles, feet, hands -trouble passing urine or change in the amount of urine -unusually high or low blood pressure -unusually weak or tired Side effects that usually do not require medical attention (report to your doctor or health care professional if they continue or are bothersome): -change in sex drive or performance -changes in breast size in both males and females -changes in emotions or moods -headache -hot flashes -irritation at site where injected -loss of appetite -skin problems like acne, dry skin -vaginal dryness This list may not describe all possible side effects. Call your doctor for medical advice about side effects. You may report side effects to FDA at 1-800-FDA-1088. Where should I keep my medicine? This drug is given in a hospital or clinic and will not be stored at home. NOTE: This sheet is a summary. It may not cover all possible information. If you have questions about this medicine, talk to your doctor, pharmacist, or health care provider.  2018 [employer] Standard (2013-04-19 11:10:35)  

## 2016-07-16 ENCOUNTER — Ambulatory Visit
Admission: RE | Admit: 2016-07-16 | Discharge: 2016-07-16 | Disposition: A | Discharge status: Home / Self Care | Payer: PRIVATE HEALTH INSURANCE | Source: Ambulatory Visit | Attending: Radiation Oncology | Admitting: Radiation Oncology

## 2016-07-16 DIAGNOSIS — Z510 Encounter for antineoplastic radiation therapy: Secondary | ICD-10-CM | POA: Diagnosis not present

## 2016-07-17 ENCOUNTER — Ambulatory Visit
Admission: RE | Admit: 2016-07-17 | Discharge: 2016-07-17 | Disposition: A | Discharge status: Home / Self Care | Payer: PRIVATE HEALTH INSURANCE | Source: Ambulatory Visit | Attending: Radiation Oncology | Admitting: Radiation Oncology

## 2016-07-17 DIAGNOSIS — Z510 Encounter for antineoplastic radiation therapy: Secondary | ICD-10-CM | POA: Diagnosis not present

## 2016-07-18 ENCOUNTER — Ambulatory Visit
Admission: RE | Admit: 2016-07-18 | Discharge: 2016-07-18 | Disposition: A | Discharge status: Home / Self Care | Payer: PRIVATE HEALTH INSURANCE | Source: Ambulatory Visit | Attending: Radiation Oncology | Admitting: Radiation Oncology

## 2016-07-18 DIAGNOSIS — Z510 Encounter for antineoplastic radiation therapy: Secondary | ICD-10-CM | POA: Diagnosis not present

## 2016-07-22 ENCOUNTER — Ambulatory Visit
Admission: RE | Admit: 2016-07-22 | Discharge: 2016-07-22 | Disposition: A | Discharge status: Home / Self Care | Payer: PRIVATE HEALTH INSURANCE | Source: Ambulatory Visit | Attending: Radiation Oncology | Admitting: Radiation Oncology

## 2016-07-22 ENCOUNTER — Encounter: Payer: Self-pay | Admitting: Radiation Oncology

## 2016-07-22 VITALS — BP 125/86 | HR 68 | Temp 98.2°F | Ht 67.0 in | Wt 194.8 lb

## 2016-07-22 DIAGNOSIS — C50212 Malignant neoplasm of upper-inner quadrant of left female breast: Secondary | ICD-10-CM

## 2016-07-22 DIAGNOSIS — Z170 Estrogen receptor positive status [ER+]: Secondary | ICD-10-CM

## 2016-07-22 DIAGNOSIS — Z510 Encounter for antineoplastic radiation therapy: Secondary | ICD-10-CM | POA: Diagnosis not present

## 2016-07-22 NOTE — Progress Notes (Signed)
   Weekly Management Note:  Outpatient    ICD-9-CM ICD-10-CM   1. Malignant neoplasm of upper-inner quadrant of left breast in female, estrogen receptor positive (HCC) 174.2 C50.212    V86.0 Z17.0     Current Dose:  58 Gy  Projected Dose: 60 Gy   Narrative:  The patient presents for routine under treatment assessment.  CBCT/MVCT images/Port film x-rays were reviewed.  The chart was checked.   The patient presents for her 29th fraction of radiation to the left breast. She denies pain or fatigue. She reports using Radiaplex twice daily as directed.  She questions when she can return to regular activities, such as swimming.  Physical Findings:  height is 5\' 7"  (1.702 m) and weight is 194 lb 12.8 oz (88.4 kg). Her temperature is 98.2 F (36.8 C). Her blood pressure is 125/86 and her pulse is 68. Her oxygen saturation is 98%.   Wt Readings from Last 3 Encounters:  07/22/16 194 lb 12.8 oz (88.4 kg)  07/14/16 194 lb 12.8 oz (88.4 kg)  06/13/16 197 lb (89.4 kg)   Dryness and hyper throughout the breast. Punctate area of peeling at the inframammary fold.  Impression:  The patient is tolerating radiotherapy.  Plan:  Continue radiotherapy as planned. Patient will follow up in 1 month.   Continue to use Neosporin to area of peeling. Patient should also use Radiaplex to intact skin within the treatment fields for the next month. She may use Vitamin E oil 2-3 times a day when Radiaplex runs out.  I advised the patient to wait 3-4 weeks before swimming. She should not get in the water if her skin is not intact.   ________________________________   Eppie Gibson, M.D.   This document serves as a record of services personally performed by Eppie Gibson, MD. It was created on her behalf by Maryla Morrow, a trained medical scribe. The creation of this record is based on the scribe's personal observations and the provider's statements to them. This document has been checked and approved by the  attending provider.

## 2016-07-22 NOTE — Progress Notes (Signed)
Ms. Carreno presents for her 29th fraction of radiation to her Left Breast. She denies pain or fatigue. Her Left Breast is hyperpigmented and she is continuing to use Radiaplex twice daily. She is aware that we will call her for a follow up appointment in one month.   BP 125/86   Pulse 68   Temp 98.2 F (36.8 C)   Ht 5\' 7"  (1.702 m)   Wt 194 lb 12.8 oz (88.4 kg)   SpO2 98% Comment: room air  BMI 30.51 kg/m    Wt Readings from Last 3 Encounters:  07/22/16 194 lb 12.8 oz (88.4 kg)  07/14/16 194 lb 12.8 oz (88.4 kg)  06/13/16 197 lb (89.4 kg)

## 2016-07-23 ENCOUNTER — Encounter: Payer: Self-pay | Admitting: Radiation Oncology

## 2016-07-23 ENCOUNTER — Ambulatory Visit
Admission: RE | Admit: 2016-07-23 | Discharge: 2016-07-23 | Disposition: A | Discharge status: Home / Self Care | Payer: PRIVATE HEALTH INSURANCE | Source: Ambulatory Visit | Attending: Radiation Oncology | Admitting: Radiation Oncology

## 2016-07-23 DIAGNOSIS — Z510 Encounter for antineoplastic radiation therapy: Secondary | ICD-10-CM | POA: Diagnosis not present

## 2016-07-24 ENCOUNTER — Ambulatory Visit: Payer: PRIVATE HEALTH INSURANCE

## 2016-07-25 ENCOUNTER — Other Ambulatory Visit: Payer: PRIVATE HEALTH INSURANCE

## 2016-07-25 ENCOUNTER — Telehealth: Payer: Self-pay

## 2016-07-25 ENCOUNTER — Ambulatory Visit: Payer: PRIVATE HEALTH INSURANCE

## 2016-07-25 ENCOUNTER — Other Ambulatory Visit (HOSPITAL_BASED_OUTPATIENT_CLINIC_OR_DEPARTMENT_OTHER): Payer: PRIVATE HEALTH INSURANCE

## 2016-07-25 ENCOUNTER — Ambulatory Visit (HOSPITAL_BASED_OUTPATIENT_CLINIC_OR_DEPARTMENT_OTHER): Payer: PRIVATE HEALTH INSURANCE

## 2016-07-25 ENCOUNTER — Ambulatory Visit (HOSPITAL_BASED_OUTPATIENT_CLINIC_OR_DEPARTMENT_OTHER): Payer: PRIVATE HEALTH INSURANCE | Admitting: Oncology

## 2016-07-25 ENCOUNTER — Encounter: Payer: Self-pay | Admitting: *Deleted

## 2016-07-25 VITALS — BP 132/77 | HR 70 | Temp 98.2°F | Resp 18 | Ht 67.0 in | Wt 194.1 lb

## 2016-07-25 DIAGNOSIS — C50212 Malignant neoplasm of upper-inner quadrant of left female breast: Secondary | ICD-10-CM

## 2016-07-25 DIAGNOSIS — Z5112 Encounter for antineoplastic immunotherapy: Secondary | ICD-10-CM | POA: Diagnosis not present

## 2016-07-25 DIAGNOSIS — Z79810 Long term (current) use of selective estrogen receptor modulators (SERMs): Secondary | ICD-10-CM

## 2016-07-25 DIAGNOSIS — Z170 Estrogen receptor positive status [ER+]: Secondary | ICD-10-CM

## 2016-07-25 LAB — CBC WITH DIFFERENTIAL/PLATELET
BASO%: 0.8 % (ref 0.0–2.0)
Basophils Absolute: 0 10*3/uL (ref 0.0–0.1)
EOS%: 3.6 % (ref 0.0–7.0)
Eosinophils Absolute: 0.1 10*3/uL (ref 0.0–0.5)
HCT: 35.8 % (ref 34.8–46.6)
HGB: 11.5 g/dL — ABNORMAL LOW (ref 11.6–15.9)
LYMPH%: 22.6 % (ref 14.0–49.7)
MCH: 26.3 pg (ref 25.1–34.0)
MCHC: 32 g/dL (ref 31.5–36.0)
MCV: 82.3 fL (ref 79.5–101.0)
MONO#: 0.7 10*3/uL (ref 0.1–0.9)
MONO%: 18.3 % — ABNORMAL HIGH (ref 0.0–14.0)
NEUT#: 2 10*3/uL (ref 1.5–6.5)
NEUT%: 54.7 % (ref 38.4–76.8)
Platelets: 210 10*3/uL (ref 145–400)
RBC: 4.35 10*6/uL (ref 3.70–5.45)
RDW: 13.8 % (ref 11.2–14.5)
WBC: 3.6 10*3/uL — ABNORMAL LOW (ref 3.9–10.3)
lymph#: 0.8 10*3/uL — ABNORMAL LOW (ref 0.9–3.3)

## 2016-07-25 LAB — COMPREHENSIVE METABOLIC PANEL
ALT: 30 U/L (ref 0–55)
AST: 22 U/L (ref 5–34)
Albumin: 4.1 g/dL (ref 3.5–5.0)
Alkaline Phosphatase: 81 U/L (ref 40–150)
Anion Gap: 9 mEq/L (ref 3–11)
BUN: 10 mg/dL (ref 7.0–26.0)
CO2: 27 mEq/L (ref 22–29)
Calcium: 9.8 mg/dL (ref 8.4–10.4)
Chloride: 106 mEq/L (ref 98–109)
Creatinine: 0.8 mg/dL (ref 0.6–1.1)
EGFR: >90 mL/min/{1.73_m2} (ref >90)
Glucose: 84 mg/dl (ref 70–140)
Potassium: 4.3 mEq/L (ref 3.5–5.1)
Sodium: 142 mEq/L (ref 136–145)
Total Bilirubin: <0.22 mg/dL (ref 0.20–1.20)
Total Protein: 7.5 g/dL (ref 6.4–8.3)

## 2016-07-25 MED ORDER — HEPARIN SOD (PORK) LOCK FLUSH 100 UNIT/ML IV SOLN
500.0000 [IU] | Freq: Once | INTRAVENOUS | Status: AC | PRN
  Administered 2016-07-25: 500 [IU]
  Filled 2016-07-25: qty 5

## 2016-07-25 MED ORDER — DIPHENHYDRAMINE HCL 25 MG PO CAPS
ORAL_CAPSULE | ORAL | Status: AC
Start: 2016-07-25 — End: 2016-07-25
  Filled 2016-07-25: qty 1

## 2016-07-25 MED ORDER — SODIUM CHLORIDE 0.9 % IV SOLN
Freq: Once | INTRAVENOUS | Status: AC
  Administered 2016-07-25: 11:00:00 via INTRAVENOUS

## 2016-07-25 MED ORDER — DIPHENHYDRAMINE HCL 25 MG PO CAPS
25.0000 mg | ORAL_CAPSULE | Freq: Once | ORAL | Status: AC
  Administered 2016-07-25: 25 mg via ORAL

## 2016-07-25 MED ORDER — ACETAMINOPHEN 325 MG PO TABS
650.0000 mg | ORAL_TABLET | Freq: Once | ORAL | Status: AC
Start: 2016-07-25 — End: 2016-07-25
  Administered 2016-07-25: 650 mg via ORAL

## 2016-07-25 MED ORDER — SODIUM CHLORIDE 0.9% FLUSH
10.0000 mL | INTRAVENOUS | Status: DC | PRN
  Administered 2016-07-25: 10 mL
  Filled 2016-07-25: qty 10

## 2016-07-25 MED ORDER — ACETAMINOPHEN 325 MG PO TABS
ORAL_TABLET | ORAL | Status: AC
Start: 2016-07-25 — End: 2016-07-25
  Filled 2016-07-25: qty 2

## 2016-07-25 MED ORDER — TRASTUZUMAB CHEMO 150 MG IV SOLR
6.0000 mg/kg | Freq: Once | INTRAVENOUS | Status: AC
  Administered 2016-07-25: 504 mg via INTRAVENOUS
  Filled 2016-07-25: qty 24

## 2016-07-25 NOTE — Patient Instructions (Signed)
[facility] Discharge Instructions for Patients Receiving Chemotherapy  Today you received the following chemotherapy agents: Herceptin   To help prevent nausea and vomiting after your treatment, we encourage you to take your nausea medication as directed.    If you develop nausea and vomiting that is not controlled by your nausea medication, call the clinic.   BELOW ARE SYMPTOMS THAT SHOULD BE REPORTED IMMEDIATELY:  *FEVER GREATER THAN 100.5 F  *CHILLS WITH OR WITHOUT FEVER  NAUSEA AND VOMITING THAT IS NOT CONTROLLED WITH YOUR NAUSEA MEDICATION  *UNUSUAL SHORTNESS OF BREATH  *UNUSUAL BRUISING OR BLEEDING  TENDERNESS IN MOUTH AND THROAT WITH OR WITHOUT PRESENCE OF ULCERS  *URINARY PROBLEMS  *BOWEL PROBLEMS  UNUSUAL RASH Items with * indicate a potential emergency and should be followed up as soon as possible.  Feel free to call the clinic you have any questions or concerns. The clinic phone number is [phone].  Please show the CHEMO ALERT CARD at check-in to the Emergency Department and triage nurse.   

## 2016-07-25 NOTE — Telephone Encounter (Signed)
Pt saw Dr Jana Hakim today and forgot to write down. She is asking if she is to start her tamoxifen today or is she still waiting?

## 2016-07-25 NOTE — Progress Notes (Signed)
Virginia [name]  [phone]) [phone] Fax:(336) [phone]     ID: NIAJA STICKLEY DOB: [birth year]  MR#: [id number]  EHO#:[id number]  Patient Care Team: [name], MD as PCP - General (Family Medicine) Stark Klein, MD as Consulting Physician (General Surgery) Jarae Nemmers, Virgie Dad, MD as Consulting Physician (Oncology) Eppie Gibson, MD as Attending Physician (Radiation Oncology) Delice Bison Charlestine Massed, NP as Nurse Practitioner (Hematology and Oncology) Chauncey Cruel, MD OTHER MD:  CHIEF COMPLAINT:  Estrogen receptor positive breast cancer  CURRENT TREATMENT: anti-HER-2 immunotherapy, goserelin, tamoxifen  BREAST CANCER HISTORY: From the original intake note:  [name] noted some itching on her breasts and since this was the symptom that preceded her grandmother's breast cancer she underwent baseline bilateral screening mammography at [facility] 01/10/2016. The breast density was category B. In the left breast central to the nipple there was a 1.7 cm irregular high density mass with pleomorphic calcifications. Accordingly the patient was brought back 01/14/2016 for left diagnostic mammography and ultrasonography. This confirmed a 0.8 cm irregular mass in the left breast at the 10:00 position. 0.5 cm from the nipple lateral and posterior to that there was a 0.2 cm cluster of amorphous calcifications with an associated density. Anterior to the index mass was a 0.5 cm cluster of amorphous calcifications. All 3 findings taken together 3 cm. Ultrasound of the left breast found a 1 cm irregular mass in the left breast at the 10:00 position retroareolarly. There were no other sonographic abnormalities and the left axilla was sonographically benign  On 01/15/2016 the patient underwent core needle biopsy of the left breast index lesion at 10:00, and this found invasive ductal carcinoma, E-cadherin positive, estrogen receptor 90% positive, progesterone receptor 40% positive, both with  moderate staining intensity, with an MIB-1 of 5%, but HER-2 amplified, with a signals ratio of 2.41 and the number per cell 6.50. One of the 2 areas of abnormal calcifications, the more posterior one measuring 0.2 cm, was also biopsied and showed only atypical ductal hyperplasia (S [id number] and [id number]).  Her subsequent history is as detailed below  INTERVAL HISTORY: Virginia [name] returns today for 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 She completed her radiation treatments 2 days ago. Overall she did "good" with the radiation. Her skin was very sensitive and of course it still hyperpigmented. Her energy was up and down. She was able to work right through all her treatments, missing only 2 days of work in all this time.  She continues on trastuzumab every 3 weeks, with a dose due today.  She was started on monthly goserelin 06/17/2016. She had a period after the first dose but not since. She tolerates the injections well.  REVIEW OF SYSTEMS: Atiya tells me her neuropathy has resolved. She has no permanent discomfort either in her hands or feet. Some mornings she wakes up with a little bit of numbness in her hands which may be related to carpal tunnel issues. She is not having hot flashes, significant insomnia, or vaginal dryness problems to date. She is beginning to take walks for exercise. A detailed review of systems today was otherwise stable  PAST MEDICAL HISTORY: Past Medical History:  Diagnosis Date  . Anemia    as a child  . Anxiety   . Cholestasis   . Elevated liver enzymes    during pregnancy March 2015  . Family history of breast cancer   . Family history of prostate cancer   . GERD (gastroesophageal reflux disease)  one episode  . Headache   . Hypothyroidism   . Malignant neoplasm of upper-inner quadrant of left female breast ([place]) 01/16/2016    PAST SURGICAL HISTORY: Past Surgical History:  Procedure Laterality Date  . BREAST LUMPECTOMY WITH RADIOACTIVE SEED AND  SENTINEL LYMPH NODE BIOPSY Left 03/13/2016   Procedure: LEFT BREAST LUMPECTOMY WITH RADIOACTIVE SEED X 2 AND SENTINEL LYMPH NODE BIOPSY;  Surgeon: Stark Klein, MD;  Location: [facility];  Service: General;  Laterality: Left;  . NO PAST SURGERIES    . PORTACATH PLACEMENT Right 03/13/2016   Procedure: INSERTION PORT-A-CATH;  Surgeon: Stark Klein, MD;  Location: [facility];  Service: General;  Laterality: Right;    FAMILY HISTORY Family History  Problem Relation Age of Onset  . Hypertension Mother   . Hypertension Father   . Prostate cancer Father   . Liver cancer Maternal Grandmother   . Breast cancer Paternal Grandmother        dx in her 14s  . Lung cancer Paternal Aunt        smoker  . Heart attack Paternal Grandfather   . Liver cancer Other   The patient's parents are alive, in their mid 53s as of November 2017. The patient father was diagnosed with prostate cancer at age 70. A paternal aunt was diagnosed with lung cancer in her late 68s. Also the paternal grandmother was diagnosed with breast cancer at the age of 60. On the mother's side there are 2 cases of liver cancer, age of diagnosis unknown.  GYNECOLOGIC HISTORY:  No LMP recorded.  Menarche age 36, first live birth age 46, she is [place] P2. She still having periods, most recently 01/21/2016, but they are no longer regular. She remotely used a number ring and oral contraceptives but stopped in 2016.Currently there use barrier methods for contraception.   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 Her husband [name] works as a Printmaker for [employer] (for example he worked on our (). Their children are [name] and [name],, aged 41 and 2 as of November 2017. Attention and is not a church attender    ADVANCED DIRECTIVES: Not in place   HEALTH MAINTENANCE: Social History  Substance Use Topics  . Smoking status: Never Smoker  . Smokeless tobacco: Never Used  . Alcohol use  No     Colonoscopy:  PAP:2016  Bone density:   Allergies  Allergen Reactions  . Amoxicillin Diarrhea and Nausea Only    Has patient had a PCN reaction causing immediate rash, facial/tongue/throat swelling, SOB or lightheadedness with hypotension: No Has patient had a PCN reaction causing severe rash involving mucus membranes or skin necrosis: No Has patient had a PCN reaction that required hospitalization No Has patient had a PCN reaction occurring within the last 10 years: Yes If all of the above answers are "NO", then may proceed with Cephalosporin use.     Current Outpatient Prescriptions  Medication Sig Dispense Refill  . acetaminophen (TYLENOL) 500 MG tablet Take 500-1,000 mg by mouth every 6 (six) hours as needed (for headaches.).    Marland Kitchen fluticasone (FLONASE) 50 MCG/ACT nasal spray Place 1-2 sprays into both nostrils daily as needed for allergies or rhinitis.    Marland Kitchen gabapentin (NEURONTIN) 300 MG capsule Take 1 capsule (300 mg total) by mouth at bedtime. 30 capsule 2  . goserelin (ZOLADEX) 10.8 MG injection Inject 10.8 mg into the skin every 30 (thirty) days. She is not  sure of the exact dose    . ibuprofen (ADVIL,MOTRIN) 200 MG tablet Take 400 mg by mouth every 8 (eight) hours as needed (for pain.).    Marland Kitchen LORazepam (ATIVAN) 0.5 MG tablet Take 1 tablet (0.5 mg total) by mouth at bedtime as needed for anxiety. 30 tablet 1  . Multiple Vitamins-Minerals (MULTIVITAMIN GUMMIES ADULT PO) Take 2 tablets by mouth daily.     No current facility-administered medications for this visit.     OBJECTIVE: Young African American woman In no acute distress  Vitals:   07/25/16 0935  BP: 132/77  Pulse: 70  Resp: 18  Temp: 98.2 F (36.8 C)     Body mass index is 30.4 kg/m.    ECOG FS:1 - Symptomatic but completely ambulatory   Sclerae unicteric, EOMs intact Oropharynx clear and moist No cervical or supraclavicular adenopathy Lungs no rales or rhonchi Heart regular rate and rhythm Abd  soft, nontender, positive bowel sounds MSK no focal spinal tenderness, no upper extremity lymphedema Neuro: nonfocal, well oriented, appropriate affect Breasts: The right breast is benign. The left breast is status post lumpectomy and radiation. The cosmetic result is good. There is hyperpigmentation but it is already beginning to fade. There is no evidence of residual or recurrent disease. Both axillae are benign.  LAB RESULTS:  CMP     Component Value Date/Time   NA 142 07/04/2016 1458   K 3.8 07/04/2016 1458   CL 107 03/07/2016 1612   CO2 26 07/04/2016 1458   GLUCOSE 91 07/04/2016 1458   BUN 7.3 07/04/2016 1458   CREATININE 0.8 07/04/2016 1458   CALCIUM 9.4 07/04/2016 1458   PROT 7.3 07/04/2016 1458   ALBUMIN 4.1 07/04/2016 1458   AST 19 07/04/2016 1458   ALT 18 07/04/2016 1458   ALKPHOS 63 07/04/2016 1458   BILITOT <0.22 07/04/2016 1458   GFRNONAA >60 03/07/2016 1612   GFRAA >60 03/07/2016 1612    INo results found for: SPEP, UPEP  Lab Results  Component Value Date   WBC 3.6 (L) 07/25/2016   NEUTROABS 2.0 07/25/2016   HGB 11.5 (L) 07/25/2016   HCT 35.8 07/25/2016   MCV 82.3 07/25/2016   PLT 210 07/25/2016      Chemistry      Component Value Date/Time   NA 142 07/04/2016 1458   K 3.8 07/04/2016 1458   CL 107 03/07/2016 1612   CO2 26 07/04/2016 1458   BUN 7.3 07/04/2016 1458   CREATININE 0.8 07/04/2016 1458      Component Value Date/Time   CALCIUM 9.4 07/04/2016 1458   ALKPHOS 63 07/04/2016 1458   AST 19 07/04/2016 1458   ALT 18 07/04/2016 1458   BILITOT <0.22 07/04/2016 1458       No results found for: LABCA2  No components found for: LABCA125  No results for input(s): INR in the last 168 hours.  Urinalysis    Component Value Date/Time   COLORURINE YELLOW 10/14/2012 1954   APPEARANCEUR CLEAR 10/14/2012 1954   LABSPEC >1.030 (H) 10/14/2012 1954   PHURINE 6.0 10/14/2012 1954   GLUCOSEU NEGATIVE 10/14/2012 1954   HGBUR TRACE (A) 10/14/2012  1954   BILIRUBINUR NEGATIVE 10/14/2012 1954   KETONESUR 15 (A) 10/14/2012 1954   PROTEINUR NEGATIVE 10/14/2012 1954   UROBILINOGEN 0.2 10/14/2012 1954   NITRITE NEGATIVE 10/14/2012 1954   LEUKOCYTESUR NEGATIVE 10/14/2012 1954     STUDIES: No results found.   ELIGIBLE FOR AVAILABLE RESEARCH PROTOCOL: no  ASSESSMENT: 36 y.o. [place] woman  status post left breast upper inner quadrant biopsy 01/15/2016 for a clinical T1c N0, stage IA invasive ductal carcinoma, E-cadherin positive, estrogen receptor 90% positive, progesterone receptor 40% positive, both with moderate staining intensity, with an MIB-1 of 5%, but HER-2 amplified, with a signals ratio of 2.41 and the number per cell 6.50.  (a) One of 2 areas of abnormal calcifications, the more posterior one measuring 0.2 cm, was also biopsied and showed only atypical ductal hyperplasia  (b) a 4.1 cm area of non-masslike enhancement in the left breast underwent biopsy 02/19/2016 showing only fibrocystic changes  (1) tamoxifen started 01/23/2016 neoadjuvantly  (2) genetics testing 02/22/2016 through the Breast/Ovarian gene panel offered by [facility] found no deleterious mutations in  ATM, BARD1, BRCA1, BRCA2, BRIP1, CDH1, CHEK2, EPCAM, FANCC, MLH1, MSH2, MSH6, NBN, PALB2, PMS2, PTEN, RAD51C, RAD51D, TP53, and XRCC2.     (3) status post left lumpectomy and sentinel lymph node sampling 03/13/2016 for an mpT1c pN0, stage IA invasive ductal carcinoma, grade 2, with negative margins.  (4) paclitaxel weekly 12 and trastuzumab every 21 days beginning on 04/10/2016.  (a) paclitaxel discontinued after 5 doses because of atypical neuropathy symptoms. Last dose 04/28/2016  (5) trastuzumab to be continued to complete a year--through February 2019  (a) echocardiogram 02/11/2016 shows ejection fraction in the 60-65% range.  (b) echo 05/28/2916 shoe\ws EF in the 65-70% range  (6) adjuvant radiation 06/11/16 - 07/23/16 Site/dose:    1) Left Breast / 50 Gy  in 25 fractions 2) Left Breast Boost / 10 Gy in 5 fractions  (7) tamoxifen resumed to 07/25/2016  (8) patient resumed menses post chemo, April 2018  (a) goserelin started 06/17/2016  PLAN: Halia has completed her local treatments and is ready to resume anti-estrogens. She was on tamoxifen previously with good tolerance. She understands the difference between tamoxifen and anastrozole and today we discussed the possible toxicities, side effects and complications of these agents.  While there is some data suggesting there may be an improvement in disease-free survival with anastrozole in cases like these, for now at least she prefers to continue on tamoxifen and the plan will be to switch to an aromatase inhibitor after 2 or 5 years which she will at some point receive a total of 2 years of an aromatase inhibitor and I think that will make up for the difference  Her next echocardiogram will be due early July. Her next goserelin dose will be due June 19. Her next trastuzumab dose will be due June 22. Note that if the study just presented at [facility] seems valid, and 6 months of trastuzumab is equivalent to 12, we may discontinue trastuzumab after the August treatments.  She knows to call for any problems that may develop before her next visit here.  Chauncey Cruel, MD   07/25/2016 9:52 AM Medical Oncology and Hematology [facility] [address] [place], [place] [address] Tel. [phone]    Fax. [phone]

## 2016-07-25 NOTE — Progress Notes (Signed)
  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 The patient developed increasing fatigue. She had soreness, hyperpigmenation, and some swelling of the left breast. The skin of the left breast was intact.  Plan: The patient has completed radiation treatment. The patient will return to radiation oncology clinic for routine followup in one month. I advised her to call or return sooner if she has any questions or concerns related to her recovery or treatment.  -----------------------------------  Eppie Gibson, MD  This document serves as a record of services personally performed by Eppie Gibson, MD. It was created on her behalf by Darcus Austin, a trained medical scribe. The creation of this record is based on the scribe's personal observations and the provider's statements to them. This document has been checked and approved by the attending provider.

## 2016-07-28 ENCOUNTER — Ambulatory Visit: Payer: PRIVATE HEALTH INSURANCE

## 2016-07-29 ENCOUNTER — Telehealth: Payer: Self-pay | Admitting: *Deleted

## 2016-07-29 ENCOUNTER — Ambulatory Visit: Payer: PRIVATE HEALTH INSURANCE

## 2016-07-29 NOTE — Telephone Encounter (Signed)
Called patient to inform of fu with Dr. Isidore Moos on 08/22/16 @ 3 pm, spoke with patient and she is aware of this appt.

## 2016-07-30 ENCOUNTER — Other Ambulatory Visit: Payer: Self-pay

## 2016-07-30 ENCOUNTER — Telehealth: Payer: Self-pay

## 2016-07-30 ENCOUNTER — Ambulatory Visit: Payer: PRIVATE HEALTH INSURANCE

## 2016-07-30 MED ORDER — TAMOXIFEN CITRATE 20 MG PO TABS: 20.0000 mg | ORAL_TABLET | Freq: Every day | ORAL | 3 refills | Status: DC

## 2016-07-30 NOTE — Telephone Encounter (Signed)
Per Dr Magrinat's note on 07/25/16 pt can resume Tamoxifen.  Prescription for Tamoxifen 20mg  daily sent to her pharmacy by this RN and pt informed to resume now.

## 2016-07-30 NOTE — Telephone Encounter (Signed)
Per phone msg from pt on 07/25/16: Spoke with pt by phone to inform her per Dr Magrinat's office note from her visit on 07/25/16 she may start taking Tamoxifen on 07/25/16.  Pt states she needs a new prescription.  Prescription will be sent to her pharmacy of preference.

## 2016-07-31 ENCOUNTER — Ambulatory Visit: Payer: PRIVATE HEALTH INSURANCE

## 2016-08-01 ENCOUNTER — Ambulatory Visit: Payer: PRIVATE HEALTH INSURANCE

## 2016-08-04 ENCOUNTER — Ambulatory Visit: Payer: PRIVATE HEALTH INSURANCE

## 2016-08-05 ENCOUNTER — Ambulatory Visit: Payer: PRIVATE HEALTH INSURANCE

## 2016-08-06 ENCOUNTER — Ambulatory Visit: Payer: PRIVATE HEALTH INSURANCE

## 2016-08-12 ENCOUNTER — Ambulatory Visit (HOSPITAL_BASED_OUTPATIENT_CLINIC_OR_DEPARTMENT_OTHER): Payer: PRIVATE HEALTH INSURANCE

## 2016-08-12 VITALS — BP 134/88 | HR 70 | Temp 98.0°F | Resp 20

## 2016-08-12 DIAGNOSIS — Z5111 Encounter for antineoplastic chemotherapy: Secondary | ICD-10-CM

## 2016-08-12 DIAGNOSIS — C50212 Malignant neoplasm of upper-inner quadrant of left female breast: Secondary | ICD-10-CM | POA: Diagnosis not present

## 2016-08-12 DIAGNOSIS — Z170 Estrogen receptor positive status [ER+]: Secondary | ICD-10-CM

## 2016-08-12 MED ORDER — GOSERELIN ACETATE 3.6 MG ~~LOC~~ IMPL
3.6000 mg | DRUG_IMPLANT | Freq: Once | SUBCUTANEOUS | Status: AC
  Administered 2016-08-12: 3.6 mg via SUBCUTANEOUS
  Filled 2016-08-12: qty 3.6

## 2016-08-12 NOTE — Patient Instructions (Signed)
Goserelin injection What is this medicine? GOSERELIN (GOE se rel in) is similar to a hormone found in the body. It lowers the amount of sex hormones that the body makes. Men will have lower testosterone levels and women will have lower estrogen levels while taking this medicine. In men, this medicine is used to treat prostate cancer; the injection is either given once per month or once every 12 weeks. A once per month injection (only) is used to treat women with endometriosis, dysfunctional uterine bleeding, or advanced breast cancer. This medicine may be used for other purposes; ask your health care provider or pharmacist if you have questions. COMMON BRAND NAME(S): Zoladex What should I tell my health care provider before I take this medicine? They need to know if you have any of these conditions (some only apply to women): -diabetes -heart disease or previous heart attack -high blood pressure -high cholesterol -kidney disease -osteoporosis or low bone density -problems passing urine -spinal cord injury -stroke -tobacco smoker -an unusual or allergic reaction to goserelin, hormone therapy, other medicines, foods, dyes, or preservatives -pregnant or trying to get pregnant -breast-feeding How should I use this medicine? This medicine is for injection under the skin. It is given by a health care professional in a hospital or clinic setting. Men receive this injection once every 4 weeks or once every 12 weeks. Women will only receive the once every 4 weeks injection. Talk to your pediatrician regarding the use of this medicine in children. Special care may be needed. Overdosage: If you think you have taken too much of this medicine contact a poison control center or emergency room at once. NOTE: This medicine is only for you. Do not share this medicine with others. What if I miss a dose? It is important not to miss your dose. Call your doctor or health care professional if you are unable to  keep an appointment. What may interact with this medicine? -female hormones like estrogen -herbal or dietary supplements like black cohosh, chasteberry, or DHEA -female hormones like testosterone -prasterone This list may not describe all possible interactions. Give your health care provider a list of all the medicines, herbs, non-prescription drugs, or dietary supplements you use. Also tell them if you smoke, drink alcohol, or use illegal drugs. Some items may interact with your medicine. What should I watch for while using this medicine? Visit your doctor or health care professional for regular checks on your progress. Your symptoms may appear to get worse during the first weeks of this therapy. Tell your doctor or healthcare professional if your symptoms do not start to get better or if they get worse after this time. Your bones may get weaker if you take this medicine for a long time. If you smoke or frequently drink alcohol you may increase your risk of bone loss. A family history of osteoporosis, chronic use of drugs for seizures (convulsions), or corticosteroids can also increase your risk of bone loss. Talk to your doctor about how to keep your bones strong. This medicine should stop regular monthly menstration in women. Tell your doctor if you continue to menstrate. Women should not become pregnant while taking this medicine or for 12 weeks after stopping this medicine. Women should inform their doctor if they wish to become pregnant or think they might be pregnant. There is a potential for serious side effects to an unborn child. Talk to your health care professional or pharmacist for more information. Do not breast-feed an infant while taking   this medicine. Men should inform their doctors if they wish to father a child. This medicine may lower sperm counts. Talk to your health care professional or pharmacist for more information. What side effects may I notice from receiving this  medicine? Side effects that you should report to your doctor or health care professional as soon as possible: -allergic reactions like skin rash, itching or hives, swelling of the face, lips, or tongue -bone pain -breathing problems -changes in vision -chest pain -feeling faint or lightheaded, falls -fever, chills -pain, swelling, warmth in the leg -pain, tingling, numbness in the hands or feet -signs and symptoms of low blood pressure like dizziness; feeling faint or lightheaded, falls; unusually weak or tired -stomach pain -swelling of the ankles, feet, hands -trouble passing urine or change in the amount of urine -unusually high or low blood pressure -unusually weak or tired Side effects that usually do not require medical attention (report to your doctor or health care professional if they continue or are bothersome): -change in sex drive or performance -changes in breast size in both males and females -changes in emotions or moods -headache -hot flashes -irritation at site where injected -loss of appetite -skin problems like acne, dry skin -vaginal dryness This list may not describe all possible side effects. Call your doctor for medical advice about side effects. You may report side effects to FDA at 1-800-FDA-1088. Where should I keep my medicine? This drug is given in a hospital or clinic and will not be stored at home. NOTE: This sheet is a summary. It may not cover all possible information. If you have questions about this medicine, talk to your doctor, pharmacist, or health care provider.  2018 [employer] Standard (2013-04-19 11:10:35)  

## 2016-08-13 ENCOUNTER — Other Ambulatory Visit: Payer: Self-pay | Admitting: Oncology

## 2016-08-15 ENCOUNTER — Other Ambulatory Visit (HOSPITAL_BASED_OUTPATIENT_CLINIC_OR_DEPARTMENT_OTHER): Payer: PRIVATE HEALTH INSURANCE

## 2016-08-15 ENCOUNTER — Ambulatory Visit (HOSPITAL_BASED_OUTPATIENT_CLINIC_OR_DEPARTMENT_OTHER): Payer: PRIVATE HEALTH INSURANCE

## 2016-08-15 DIAGNOSIS — Z170 Estrogen receptor positive status [ER+]: Secondary | ICD-10-CM

## 2016-08-15 DIAGNOSIS — Z5112 Encounter for antineoplastic immunotherapy: Secondary | ICD-10-CM

## 2016-08-15 DIAGNOSIS — C50212 Malignant neoplasm of upper-inner quadrant of left female breast: Secondary | ICD-10-CM | POA: Diagnosis not present

## 2016-08-15 LAB — CBC WITH DIFFERENTIAL/PLATELET
BASO%: 0.4 % (ref 0.0–2.0)
Basophils Absolute: 0 10*3/uL (ref 0.0–0.1)
EOS%: 3.2 % (ref 0.0–7.0)
Eosinophils Absolute: 0.2 10*3/uL (ref 0.0–0.5)
HCT: 35.2 % (ref 34.8–46.6)
HGB: 10.9 g/dL — ABNORMAL LOW (ref 11.6–15.9)
LYMPH%: 25.8 % (ref 14.0–49.7)
MCH: 25.9 pg (ref 25.1–34.0)
MCHC: 31 g/dL — ABNORMAL LOW (ref 31.5–36.0)
MCV: 83.6 fL (ref 79.5–101.0)
MONO#: 0.5 10*3/uL (ref 0.1–0.9)
MONO%: 11 % (ref 0.0–14.0)
NEUT#: 2.9 10*3/uL (ref 1.5–6.5)
NEUT%: 59.6 % (ref 38.4–76.8)
Platelets: 148 10*3/uL (ref 145–400)
RBC: 4.21 10*6/uL (ref 3.70–5.45)
RDW: 14.3 % (ref 11.2–14.5)
WBC: 4.9 10*3/uL (ref 3.9–10.3)
lymph#: 1.3 10*3/uL (ref 0.9–3.3)

## 2016-08-15 LAB — COMPREHENSIVE METABOLIC PANEL
ALT: 74 U/L — ABNORMAL HIGH (ref 0–55)
AST: 43 U/L — ABNORMAL HIGH (ref 5–34)
Albumin: 3.9 g/dL (ref 3.5–5.0)
Alkaline Phosphatase: 94 U/L (ref 40–150)
Anion Gap: 13 mEq/L — ABNORMAL HIGH (ref 3–11)
BUN: 9.5 mg/dL (ref 7.0–26.0)
CO2: 25 mEq/L (ref 22–29)
Calcium: 9.6 mg/dL (ref 8.4–10.4)
Chloride: 105 mEq/L (ref 98–109)
Creatinine: 0.8 mg/dL (ref 0.6–1.1)
EGFR: >90 mL/min/{1.73_m2} (ref >90)
Glucose: 105 mg/dl (ref 70–140)
Potassium: 3.9 mEq/L (ref 3.5–5.1)
Sodium: 144 mEq/L (ref 136–145)
Total Bilirubin: <0.22 mg/dL (ref 0.20–1.20)
Total Protein: 7.5 g/dL (ref 6.4–8.3)

## 2016-08-15 MED ORDER — ACETAMINOPHEN 325 MG PO TABS
ORAL_TABLET | ORAL | Status: AC
  Filled 2016-08-15: qty 2

## 2016-08-15 MED ORDER — ACETAMINOPHEN 325 MG PO TABS
650.0000 mg | ORAL_TABLET | Freq: Once | ORAL | Status: AC
Start: 2016-08-15 — End: 2016-08-15
  Administered 2016-08-15: 650 mg via ORAL

## 2016-08-15 MED ORDER — SODIUM CHLORIDE 0.9 % IV SOLN
6.0000 mg/kg | Freq: Once | INTRAVENOUS | Status: AC
  Administered 2016-08-15: 504 mg via INTRAVENOUS
  Filled 2016-08-15: qty 24

## 2016-08-15 MED ORDER — HEPARIN SOD (PORK) LOCK FLUSH 100 UNIT/ML IV SOLN
500.0000 [IU] | Freq: Once | INTRAVENOUS | Status: AC | PRN
  Administered 2016-08-15: 500 [IU]
  Filled 2016-08-15: qty 5

## 2016-08-15 MED ORDER — DIPHENHYDRAMINE HCL 25 MG PO CAPS
25.0000 mg | ORAL_CAPSULE | Freq: Once | ORAL | Status: AC
  Administered 2016-08-15: 25 mg via ORAL

## 2016-08-15 MED ORDER — DIPHENHYDRAMINE HCL 25 MG PO CAPS
ORAL_CAPSULE | ORAL | Status: AC
  Filled 2016-08-15: qty 1

## 2016-08-15 MED ORDER — SODIUM CHLORIDE 0.9 % IV SOLN
Freq: Once | INTRAVENOUS | Status: AC
  Administered 2016-08-15: 16:00:00 via INTRAVENOUS

## 2016-08-15 MED ORDER — SODIUM CHLORIDE 0.9% FLUSH
10.0000 mL | INTRAVENOUS | Status: DC | PRN
  Administered 2016-08-15: 10 mL
  Filled 2016-08-15: qty 10

## 2016-08-15 NOTE — Patient Instructions (Signed)
[facility] Discharge Instructions for Patients Receiving Chemotherapy  Today you received the following chemotherapy agents Herceptin  To help prevent nausea and vomiting after your treatment, we encourage you to take your nausea medication    If you develop nausea and vomiting that is not controlled by your nausea medication, call the clinic.   BELOW ARE SYMPTOMS THAT SHOULD BE REPORTED IMMEDIATELY:  *FEVER GREATER THAN 100.5 F  *CHILLS WITH OR WITHOUT FEVER  NAUSEA AND VOMITING THAT IS NOT CONTROLLED WITH YOUR NAUSEA MEDICATION  *UNUSUAL SHORTNESS OF BREATH  *UNUSUAL BRUISING OR BLEEDING  TENDERNESS IN MOUTH AND THROAT WITH OR WITHOUT PRESENCE OF ULCERS  *URINARY PROBLEMS  *BOWEL PROBLEMS  UNUSUAL RASH Items with * indicate a potential emergency and should be followed up as soon as possible.  Feel free to call the clinic you have any questions or concerns. The clinic phone number is [phone].  Please show the CHEMO ALERT CARD at check-in to the Emergency Department and triage nurse.   

## 2016-08-18 ENCOUNTER — Encounter: Payer: Self-pay | Admitting: Radiation Oncology

## 2016-08-22 ENCOUNTER — Encounter: Payer: Self-pay | Admitting: Radiation Oncology

## 2016-08-22 ENCOUNTER — Ambulatory Visit
Admission: RE | Admit: 2016-08-22 | Discharge: 2016-08-22 | Disposition: A | Discharge status: Home / Self Care | Payer: PRIVATE HEALTH INSURANCE | Source: Ambulatory Visit | Attending: Radiation Oncology | Admitting: Radiation Oncology

## 2016-08-22 VITALS — BP 131/88 | HR 69 | Temp 98.3°F | Resp 18 | Ht 67.0 in | Wt 196.8 lb

## 2016-08-22 DIAGNOSIS — C50212 Malignant neoplasm of upper-inner quadrant of left female breast: Secondary | ICD-10-CM

## 2016-08-22 DIAGNOSIS — Z170 Estrogen receptor positive status [ER+]: Secondary | ICD-10-CM

## 2016-08-22 HISTORY — DX: Personal history of irradiation: Z92.3

## 2016-08-22 NOTE — Progress Notes (Signed)
Virginia [name] 36 y.o. woman with multifocal invasive ductal carcinoma, grade II, triple positive radiation completed 07-23-16, one month FU.  Skin status:Left breast skin in tact with tanning sensitive to touch. What lotion are you using? Using a lotion with vitamin E. Have you seen med onc? If not, when is appointment: 07-25-16 Dr. Vinie Sill If they are ER+, have they started Al or Tamoxifen? If not, why? Tamoxifen Discuss survivorship appointment:Not scheduled Have you had a mammogram scheduled? Not scheduled yet Offer referral to [facility]. Will receive at the survivorship appointment: Appetite:Good Pain:No Fatigue:Having fatigue in the afternoons Arm mobility:Able to raise left arm without problems. Lymphedema:No06-01-18 Saw Dt Magrinat restarted Tamoxifen. Wt Readings from Last 3 Encounters:  08/22/16 196 lb 12.8 oz (89.3 kg)  07/25/16 194 lb 1.6 oz (88 kg)  07/22/16 194 lb 12.8 oz (88.4 kg)  BP 131/88   Pulse 69   Temp 98.3 F (36.8 C) (Oral)   Resp 18   Ht 5\' 7"  (1.702 m)   Wt 196 lb 12.8 oz (89.3 kg)   SpO2 99%   BMI 30.82 kg/m

## 2016-08-22 NOTE — Progress Notes (Signed)
Radiation Oncology         [phone] ________________________________  Name: Virginia [name] MRN: [id number]  Date: 08/22/2016  DOB: 1980/07/16  Follow-Up Visit Note  Outpatient   CC: [name], MD  Magrinat, Virgie Dad, MD  Diagnosis and Prior Radiotherapy:    ICD-10-CM   1. 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  On review of systems, the patient denies pain. She reports good left arm mobility, and denies lymphedema concerns. She reports some fatigue in the afternoon. She reports her skin is somewhat sensitive to touch, but otherwise is healing well. She is using a lotion with vitamin E added. She is currently taking Tamoxifen without complaint.    The patient is scheduled to follow up with Dr. Jana Hakim on 10/24/16.  ALLERGIES:  is allergic to amoxicillin.  Meds: Current Outpatient Prescriptions  Medication Sig Dispense Refill  . acetaminophen (TYLENOL) 500 MG tablet Take 500-1,000 mg by mouth every 6 (six) hours as needed (for headaches.).    Marland Kitchen goserelin (ZOLADEX) 10.8 MG injection Inject 10.8 mg into the skin every 30 (thirty) days. She is not sure of the exact dose    . Multiple Vitamins-Minerals (MULTIVITAMIN GUMMIES ADULT PO) Take 2 tablets by mouth daily.    . tamoxifen (NOLVADEX) 20 MG tablet Take 1 tablet (20 mg total) by mouth daily. 30 tablet 3  . fluticasone (FLONASE) 50 MCG/ACT nasal spray Place 1-2 sprays into both nostrils daily as needed for allergies or rhinitis.    Marland Kitchen gabapentin (NEURONTIN) 300 MG capsule Take 1 capsule  (300 mg total) by mouth at bedtime. (Patient not taking: Reported on 08/22/2016) 30 capsule 2  . ibuprofen (ADVIL,MOTRIN) 200 MG tablet Take 400 mg by mouth every 8 (eight) hours as needed (for pain.).    Marland Kitchen LORazepam (ATIVAN) 0.5 MG tablet Take 1 tablet (0.5 mg total) by mouth at bedtime as needed for anxiety. (Patient not taking: Reported on 08/22/2016) 30 tablet 1   No current facility-administered medications for this encounter.    Physical Findings: The patient is in no acute distress. Patient is alert and oriented.  height is 5\' 7"  (1.702 m) and weight is 196 lb 12.8 oz (89.3 kg). Her oral temperature is 98.3 F (36.8 C). Her blood pressure is 131/88 and her pulse is 69. Her respiration is 18 and oxygen saturation is 99%.   Some resolving hyperpigmentation over the left breast and hypopigmentation at the inframammary fold. Skin is not dry and is totally intact.   Lab Findings: Lab Results  Component Value Date   WBC 4.9 08/15/2016   HGB 10.9 (L) 08/15/2016   HCT 35.2 08/15/2016   MCV 83.6 08/15/2016   PLT 148 08/15/2016    Radiographic Findings: No results found.  Impression/Plan:  The patient is recovering well from the effects of radiation therapy.  The patient is already using Vitamin E cream to the skin within the treatment fields, and I advised her to continue this for at least 2-3 months.   She will continue to follow with  Dr. Jana Hakim as indicated. She should continue to receive routine yearly mammograms and perform frequent self breast exams. The patient will follow up with me on an as needed basis. I encouraged her to call or return to the clinic with any questions or concerns that may arise.  . _____________________________________   Eppie Gibson, MD  This document serves as a record of services personally performed by Eppie Gibson, MD. It was created on her behalf by Maryla Morrow, a trained medical scribe. The creation of this record is based on the scribe's  personal observations and the provider's statements to them. This document has been checked and approved by the attending provider.

## 2016-09-05 ENCOUNTER — Ambulatory Visit (HOSPITAL_BASED_OUTPATIENT_CLINIC_OR_DEPARTMENT_OTHER): Payer: PRIVATE HEALTH INSURANCE

## 2016-09-05 ENCOUNTER — Other Ambulatory Visit: Payer: Self-pay | Admitting: Oncology

## 2016-09-05 ENCOUNTER — Other Ambulatory Visit (HOSPITAL_BASED_OUTPATIENT_CLINIC_OR_DEPARTMENT_OTHER): Payer: PRIVATE HEALTH INSURANCE

## 2016-09-05 VITALS — BP 145/82 | HR 65 | Temp 99.0°F | Resp 18

## 2016-09-05 DIAGNOSIS — Z170 Estrogen receptor positive status [ER+]: Secondary | ICD-10-CM

## 2016-09-05 DIAGNOSIS — C50212 Malignant neoplasm of upper-inner quadrant of left female breast: Secondary | ICD-10-CM | POA: Diagnosis not present

## 2016-09-05 DIAGNOSIS — Z5112 Encounter for antineoplastic immunotherapy: Secondary | ICD-10-CM

## 2016-09-05 LAB — CBC WITH DIFFERENTIAL/PLATELET
BASO%: 0.5 % (ref 0.0–2.0)
Basophils Absolute: 0 10*3/uL (ref 0.0–0.1)
EOS%: 1.9 % (ref 0.0–7.0)
Eosinophils Absolute: 0.1 10*3/uL (ref 0.0–0.5)
HCT: 38.5 % (ref 34.8–46.6)
HGB: 11.9 g/dL (ref 11.6–15.9)
LYMPH%: 38.2 % (ref 14.0–49.7)
MCH: 25.7 pg (ref 25.1–34.0)
MCHC: 30.9 g/dL — ABNORMAL LOW (ref 31.5–36.0)
MCV: 83.2 fL (ref 79.5–101.0)
MONO#: 0.5 10*3/uL (ref 0.1–0.9)
MONO%: 13.8 % (ref 0.0–14.0)
NEUT#: 1.7 10*3/uL (ref 1.5–6.5)
NEUT%: 45.6 % (ref 38.4–76.8)
Platelets: 168 10*3/uL (ref 145–400)
RBC: 4.63 10*6/uL (ref 3.70–5.45)
RDW: 14.4 % (ref 11.2–14.5)
WBC: 3.7 10*3/uL — ABNORMAL LOW (ref 3.9–10.3)
lymph#: 1.4 10*3/uL (ref 0.9–3.3)

## 2016-09-05 LAB — COMPREHENSIVE METABOLIC PANEL
ALT: 28 U/L (ref 0–55)
AST: 20 U/L (ref 5–34)
Albumin: 4 g/dL (ref 3.5–5.0)
Alkaline Phosphatase: 78 U/L (ref 40–150)
Anion Gap: 10 mEq/L (ref 3–11)
BUN: 9.4 mg/dL (ref 7.0–26.0)
CO2: 29 mEq/L (ref 22–29)
Calcium: 9.8 mg/dL (ref 8.4–10.4)
Chloride: 104 mEq/L (ref 98–109)
Creatinine: 0.8 mg/dL (ref 0.6–1.1)
EGFR: >90 mL/min/{1.73_m2} (ref >90)
Glucose: 103 mg/dl (ref 70–140)
Potassium: 4.3 mEq/L (ref 3.5–5.1)
Sodium: 143 mEq/L (ref 136–145)
Total Bilirubin: <0.22 mg/dL (ref 0.20–1.20)
Total Protein: 7.6 g/dL (ref 6.4–8.3)

## 2016-09-05 MED ORDER — ACETAMINOPHEN 325 MG PO TABS
650.0000 mg | ORAL_TABLET | Freq: Once | ORAL | Status: AC
  Administered 2016-09-05: 650 mg via ORAL

## 2016-09-05 MED ORDER — HEPARIN SOD (PORK) LOCK FLUSH 100 UNIT/ML IV SOLN
500.0000 [IU] | Freq: Once | INTRAVENOUS | Status: AC | PRN
Start: 2016-09-05 — End: 2016-09-05
  Administered 2016-09-05: 500 [IU]
  Filled 2016-09-05: qty 5

## 2016-09-05 MED ORDER — ACETAMINOPHEN 325 MG PO TABS
ORAL_TABLET | ORAL | Status: AC
  Filled 2016-09-05: qty 2

## 2016-09-05 MED ORDER — DIPHENHYDRAMINE HCL 25 MG PO CAPS
ORAL_CAPSULE | ORAL | Status: AC
  Filled 2016-09-05: qty 1

## 2016-09-05 MED ORDER — SODIUM CHLORIDE 0.9% FLUSH
10.0000 mL | INTRAVENOUS | Status: DC | PRN
  Administered 2016-09-05: 10 mL
  Filled 2016-09-05: qty 10

## 2016-09-05 MED ORDER — SODIUM CHLORIDE 0.9 % IV SOLN
6.0000 mg/kg | Freq: Once | INTRAVENOUS | Status: AC
  Administered 2016-09-05: 504 mg via INTRAVENOUS
  Filled 2016-09-05: qty 24

## 2016-09-05 MED ORDER — DIPHENHYDRAMINE HCL 25 MG PO CAPS
25.0000 mg | ORAL_CAPSULE | Freq: Once | ORAL | Status: AC
Start: 2016-09-05 — End: 2016-09-05
  Administered 2016-09-05: 25 mg via ORAL

## 2016-09-05 MED ORDER — SODIUM CHLORIDE 0.9 % IV SOLN
Freq: Once | INTRAVENOUS | Status: AC
  Administered 2016-09-05: 16:00:00 via INTRAVENOUS

## 2016-09-05 NOTE — Patient Instructions (Signed)
[facility] Discharge Instructions for Patients Receiving Chemotherapy  Today you received the following chemotherapy agents Herceptin  To help prevent nausea and vomiting after your treatment, we encourage you to take your nausea medication    If you develop nausea and vomiting that is not controlled by your nausea medication, call the clinic.   BELOW ARE SYMPTOMS THAT SHOULD BE REPORTED IMMEDIATELY:  *FEVER GREATER THAN 100.5 F  *CHILLS WITH OR WITHOUT FEVER  NAUSEA AND VOMITING THAT IS NOT CONTROLLED WITH YOUR NAUSEA MEDICATION  *UNUSUAL SHORTNESS OF BREATH  *UNUSUAL BRUISING OR BLEEDING  TENDERNESS IN MOUTH AND THROAT WITH OR WITHOUT PRESENCE OF ULCERS  *URINARY PROBLEMS  *BOWEL PROBLEMS  UNUSUAL RASH Items with * indicate a potential emergency and should be followed up as soon as possible.  Feel free to call the clinic you have any questions or concerns. The clinic phone number is [phone].  Please show the CHEMO ALERT CARD at check-in to the Emergency Department and triage nurse.   

## 2016-09-16 ENCOUNTER — Ambulatory Visit (HOSPITAL_BASED_OUTPATIENT_CLINIC_OR_DEPARTMENT_OTHER): Payer: PRIVATE HEALTH INSURANCE

## 2016-09-16 VITALS — BP 144/84 | HR 68 | Temp 97.9°F | Resp 20

## 2016-09-16 DIAGNOSIS — C50212 Malignant neoplasm of upper-inner quadrant of left female breast: Secondary | ICD-10-CM

## 2016-09-16 DIAGNOSIS — Z5111 Encounter for antineoplastic chemotherapy: Secondary | ICD-10-CM

## 2016-09-16 DIAGNOSIS — Z170 Estrogen receptor positive status [ER+]: Secondary | ICD-10-CM

## 2016-09-16 MED ORDER — GOSERELIN ACETATE 3.6 MG ~~LOC~~ IMPL
3.6000 mg | DRUG_IMPLANT | Freq: Once | SUBCUTANEOUS | Status: AC
Start: 2016-09-16 — End: 2016-09-16
  Administered 2016-09-16: 3.6 mg via SUBCUTANEOUS
  Filled 2016-09-16: qty 3.6

## 2016-09-16 NOTE — Patient Instructions (Signed)
Goserelin injection What is this medicine? GOSERELIN (GOE se rel in) is similar to a hormone found in the body. It lowers the amount of sex hormones that the body makes. Men will have lower testosterone levels and women will have lower estrogen levels while taking this medicine. In men, this medicine is used to treat prostate cancer; the injection is either given once per month or once every 12 weeks. A once per month injection (only) is used to treat women with endometriosis, dysfunctional uterine bleeding, or advanced breast cancer. This medicine may be used for other purposes; ask your health care provider or pharmacist if you have questions. COMMON BRAND NAME(S): Zoladex What should I tell my health care provider before I take this medicine? They need to know if you have any of these conditions (some only apply to women): -diabetes -heart disease or previous heart attack -high blood pressure -high cholesterol -kidney disease -osteoporosis or low bone density -problems passing urine -spinal cord injury -stroke -tobacco smoker -an unusual or allergic reaction to goserelin, hormone therapy, other medicines, foods, dyes, or preservatives -pregnant or trying to get pregnant -breast-feeding How should I use this medicine? This medicine is for injection under the skin. It is given by a health care professional in a hospital or clinic setting. Men receive this injection once every 4 weeks or once every 12 weeks. Women will only receive the once every 4 weeks injection. Talk to your pediatrician regarding the use of this medicine in children. Special care may be needed. Overdosage: If you think you have taken too much of this medicine contact a poison control center or emergency room at once. NOTE: This medicine is only for you. Do not share this medicine with others. What if I miss a dose? It is important not to miss your dose. Call your doctor or health care professional if you are unable to  keep an appointment. What may interact with this medicine? -female hormones like estrogen -herbal or dietary supplements like black cohosh, chasteberry, or DHEA -female hormones like testosterone -prasterone This list may not describe all possible interactions. Give your health care provider a list of all the medicines, herbs, non-prescription drugs, or dietary supplements you use. Also tell them if you smoke, drink alcohol, or use illegal drugs. Some items may interact with your medicine. What should I watch for while using this medicine? Visit your doctor or health care professional for regular checks on your progress. Your symptoms may appear to get worse during the first weeks of this therapy. Tell your doctor or healthcare professional if your symptoms do not start to get better or if they get worse after this time. Your bones may get weaker if you take this medicine for a long time. If you smoke or frequently drink alcohol you may increase your risk of bone loss. A family history of osteoporosis, chronic use of drugs for seizures (convulsions), or corticosteroids can also increase your risk of bone loss. Talk to your doctor about how to keep your bones strong. This medicine should stop regular monthly menstration in women. Tell your doctor if you continue to menstrate. Women should not become pregnant while taking this medicine or for 12 weeks after stopping this medicine. Women should inform their doctor if they wish to become pregnant or think they might be pregnant. There is a potential for serious side effects to an unborn child. Talk to your health care professional or pharmacist for more information. Do not breast-feed an infant while taking   this medicine. Men should inform their doctors if they wish to father a child. This medicine may lower sperm counts. Talk to your health care professional or pharmacist for more information. What side effects may I notice from receiving this  medicine? Side effects that you should report to your doctor or health care professional as soon as possible: -allergic reactions like skin rash, itching or hives, swelling of the face, lips, or tongue -bone pain -breathing problems -changes in vision -chest pain -feeling faint or lightheaded, falls -fever, chills -pain, swelling, warmth in the leg -pain, tingling, numbness in the hands or feet -signs and symptoms of low blood pressure like dizziness; feeling faint or lightheaded, falls; unusually weak or tired -stomach pain -swelling of the ankles, feet, hands -trouble passing urine or change in the amount of urine -unusually high or low blood pressure -unusually weak or tired Side effects that usually do not require medical attention (report to your doctor or health care professional if they continue or are bothersome): -change in sex drive or performance -changes in breast size in both males and females -changes in emotions or moods -headache -hot flashes -irritation at site where injected -loss of appetite -skin problems like acne, dry skin -vaginal dryness This list may not describe all possible side effects. Call your doctor for medical advice about side effects. You may report side effects to FDA at 1-800-FDA-1088. Where should I keep my medicine? This drug is given in a hospital or clinic and will not be stored at home. NOTE: This sheet is a summary. It may not cover all possible information. If you have questions about this medicine, talk to your doctor, pharmacist, or health care provider.  2018 [employer] Standard (2013-04-19 11:10:35)  

## 2016-09-26 ENCOUNTER — Other Ambulatory Visit: Payer: Self-pay | Admitting: Hematology and Oncology

## 2016-09-26 ENCOUNTER — Ambulatory Visit (HOSPITAL_COMMUNITY): Payer: PRIVATE HEALTH INSURANCE | Attending: Cardiology

## 2016-09-26 ENCOUNTER — Other Ambulatory Visit (HOSPITAL_BASED_OUTPATIENT_CLINIC_OR_DEPARTMENT_OTHER): Payer: PRIVATE HEALTH INSURANCE

## 2016-09-26 ENCOUNTER — Other Ambulatory Visit: Payer: Self-pay

## 2016-09-26 ENCOUNTER — Ambulatory Visit (HOSPITAL_BASED_OUTPATIENT_CLINIC_OR_DEPARTMENT_OTHER): Payer: PRIVATE HEALTH INSURANCE

## 2016-09-26 VITALS — BP 146/88 | HR 68 | Temp 98.1°F | Resp 18

## 2016-09-26 DIAGNOSIS — C50212 Malignant neoplasm of upper-inner quadrant of left female breast: Secondary | ICD-10-CM | POA: Diagnosis present

## 2016-09-26 DIAGNOSIS — Z170 Estrogen receptor positive status [ER+]: Secondary | ICD-10-CM

## 2016-09-26 DIAGNOSIS — R519 Headache, unspecified: Secondary | ICD-10-CM

## 2016-09-26 DIAGNOSIS — I340 Nonrheumatic mitral (valve) insufficiency: Secondary | ICD-10-CM | POA: Insufficient documentation

## 2016-09-26 DIAGNOSIS — Z5112 Encounter for antineoplastic immunotherapy: Secondary | ICD-10-CM | POA: Diagnosis not present

## 2016-09-26 DIAGNOSIS — R51 Headache: Principal | ICD-10-CM

## 2016-09-26 LAB — COMPREHENSIVE METABOLIC PANEL
ALT: 40 U/L (ref 0–55)
AST: 31 U/L (ref 5–34)
Albumin: 3.9 g/dL (ref 3.5–5.0)
Alkaline Phosphatase: 77 U/L (ref 40–150)
Anion Gap: 8 mEq/L (ref 3–11)
BUN: 9.8 mg/dL (ref 7.0–26.0)
CO2: 28 mEq/L (ref 22–29)
Calcium: 9.7 mg/dL (ref 8.4–10.4)
Chloride: 105 mEq/L (ref 98–109)
Creatinine: 0.8 mg/dL (ref 0.6–1.1)
EGFR: >90 mL/min/{1.73_m2} (ref >90)
Glucose: 104 mg/dl (ref 70–140)
Potassium: 4.4 mEq/L (ref 3.5–5.1)
Sodium: 141 mEq/L (ref 136–145)
Total Bilirubin: <0.22 mg/dL (ref 0.20–1.20)
Total Protein: 7.4 g/dL (ref 6.4–8.3)

## 2016-09-26 LAB — CBC WITH DIFFERENTIAL/PLATELET
BASO%: 1.1 % (ref 0.0–2.0)
Basophils Absolute: 0 10*3/uL (ref 0.0–0.1)
EOS%: 2.9 % (ref 0.0–7.0)
Eosinophils Absolute: 0.1 10*3/uL (ref 0.0–0.5)
HCT: 39.8 % (ref 34.8–46.6)
HGB: 12.6 g/dL (ref 11.6–15.9)
LYMPH%: 35.5 % (ref 14.0–49.7)
MCH: 25.7 pg (ref 25.1–34.0)
MCHC: 31.5 g/dL (ref 31.5–36.0)
MCV: 81.5 fL (ref 79.5–101.0)
MONO#: 0.5 10*3/uL (ref 0.1–0.9)
MONO%: 11.4 % (ref 0.0–14.0)
NEUT#: 2.2 10*3/uL (ref 1.5–6.5)
NEUT%: 49.1 % (ref 38.4–76.8)
Platelets: 179 10*3/uL (ref 145–400)
RBC: 4.89 10*6/uL (ref 3.70–5.45)
RDW: 15.3 % — ABNORMAL HIGH (ref 11.2–14.5)
WBC: 4.4 10*3/uL (ref 3.9–10.3)
lymph#: 1.6 10*3/uL (ref 0.9–3.3)

## 2016-09-26 MED ORDER — ACETAMINOPHEN 325 MG PO TABS
650.0000 mg | ORAL_TABLET | Freq: Once | ORAL | Status: AC
  Administered 2016-09-26: 650 mg via ORAL

## 2016-09-26 MED ORDER — DIPHENHYDRAMINE HCL 25 MG PO CAPS
25.0000 mg | ORAL_CAPSULE | Freq: Once | ORAL | Status: AC
  Administered 2016-09-26: 25 mg via ORAL

## 2016-09-26 MED ORDER — TRASTUZUMAB CHEMO 150 MG IV SOLR
6.0000 mg/kg | Freq: Once | INTRAVENOUS | Status: AC
  Administered 2016-09-26: 504 mg via INTRAVENOUS
  Filled 2016-09-26: qty 24

## 2016-09-26 MED ORDER — DIPHENHYDRAMINE HCL 25 MG PO CAPS
ORAL_CAPSULE | ORAL | Status: AC
  Filled 2016-09-26: qty 1

## 2016-09-26 MED ORDER — SODIUM CHLORIDE 0.9 % IV SOLN
Freq: Once | INTRAVENOUS | Status: AC
  Administered 2016-09-26: 15:00:00 via INTRAVENOUS

## 2016-09-26 MED ORDER — ACETAMINOPHEN 325 MG PO TABS
ORAL_TABLET | ORAL | Status: AC
  Filled 2016-09-26: qty 2

## 2016-09-26 MED ORDER — SODIUM CHLORIDE 0.9% FLUSH
10.0000 mL | INTRAVENOUS | Status: DC | PRN
  Administered 2016-09-26: 10 mL
  Filled 2016-09-26: qty 10

## 2016-09-26 MED ORDER — HEPARIN SOD (PORK) LOCK FLUSH 100 UNIT/ML IV SOLN
500.0000 [IU] | Freq: Once | INTRAVENOUS | Status: AC | PRN
  Administered 2016-09-26: 500 [IU]
  Filled 2016-09-26: qty 5

## 2016-09-26 MED ORDER — BUTALBITAL-APAP-CAFFEINE 50-325-40 MG PO TABS: 1.0000 | ORAL_TABLET | Freq: Four times a day (QID) | ORAL | 0 refills | Status: DC | PRN

## 2016-09-26 NOTE — Progress Notes (Signed)
Patient complains of "migraines" every time she receives Herceptin or Zoladex. Patient states Dr. Jana Hakim aware at her last visit; however, the length of time of the headaches are lasting up to 1 week. Patient reports taking tylenol, ibuprofen and sometimes benadryl for the headaches at home. Dr. Alvy Bimler notified. Prescription of Fioricet provided. Ok to proceed with treatment per Dr. Alvy Bimler. Patient and spouse verbalized understanding.

## 2016-09-26 NOTE — Patient Instructions (Signed)
[facility] Discharge Instructions for Patients Receiving Chemotherapy  Today you received the following chemotherapy agents:  Herceptin  To help prevent nausea and vomiting after your treatment, we encourage you to take your nausea medication as prescribed.   If you develop nausea and vomiting that is not controlled by your nausea medication, call the clinic.   BELOW ARE SYMPTOMS THAT SHOULD BE REPORTED IMMEDIATELY:  *FEVER GREATER THAN 100.5 F  *CHILLS WITH OR WITHOUT FEVER  NAUSEA AND VOMITING THAT IS NOT CONTROLLED WITH YOUR NAUSEA MEDICATION  *UNUSUAL SHORTNESS OF BREATH  *UNUSUAL BRUISING OR BLEEDING  TENDERNESS IN MOUTH AND THROAT WITH OR WITHOUT PRESENCE OF ULCERS  *URINARY PROBLEMS  *BOWEL PROBLEMS  UNUSUAL RASH Items with * indicate a potential emergency and should be followed up as soon as possible.  Feel free to call the clinic you have any questions or concerns. The clinic phone number is [phone].  Please show the CHEMO ALERT CARD at check-in to the Emergency Department and triage nurse.   

## 2016-09-28 ENCOUNTER — Other Ambulatory Visit: Payer: Self-pay | Admitting: Oncology

## 2016-09-28 DIAGNOSIS — C50212 Malignant neoplasm of upper-inner quadrant of left female breast: Secondary | ICD-10-CM

## 2016-09-28 DIAGNOSIS — G8929 Other chronic pain: Secondary | ICD-10-CM

## 2016-09-28 DIAGNOSIS — R51 Headache: Principal | ICD-10-CM

## 2016-09-28 DIAGNOSIS — R519 Headache, unspecified: Secondary | ICD-10-CM

## 2016-09-28 DIAGNOSIS — Z170 Estrogen receptor positive status [ER+]: Secondary | ICD-10-CM

## 2016-09-28 NOTE — Progress Notes (Unsigned)
Stacy's headaches have been more prolonged and intense than usual. We are going to get a brain MRI to clarify this. I have entered the order and called and left her a voicemail letting her know.

## 2016-10-01 ENCOUNTER — Telehealth: Payer: Self-pay | Admitting: Oncology

## 2016-10-01 NOTE — Telephone Encounter (Signed)
Patient called to move her appointment for an injection on 8/23 to 8/24 due to work

## 2016-10-06 ENCOUNTER — Ambulatory Visit (HOSPITAL_COMMUNITY)
Admission: RE | Admit: 2016-10-06 | Discharge: 2016-10-06 | Disposition: A | Discharge status: Home / Self Care | Payer: PRIVATE HEALTH INSURANCE | Source: Ambulatory Visit | Attending: Oncology | Admitting: Oncology

## 2016-10-06 DIAGNOSIS — Z170 Estrogen receptor positive status [ER+]: Secondary | ICD-10-CM | POA: Diagnosis not present

## 2016-10-06 DIAGNOSIS — R51 Headache: Secondary | ICD-10-CM | POA: Diagnosis present

## 2016-10-06 DIAGNOSIS — C50212 Malignant neoplasm of upper-inner quadrant of left female breast: Secondary | ICD-10-CM | POA: Diagnosis not present

## 2016-10-06 DIAGNOSIS — R519 Headache, unspecified: Secondary | ICD-10-CM

## 2016-10-06 DIAGNOSIS — G8929 Other chronic pain: Secondary | ICD-10-CM

## 2016-10-06 MED ORDER — GADOBENATE DIMEGLUMINE 529 MG/ML IV SOLN
20.0000 mL | Freq: Once | INTRAVENOUS | Status: AC | PRN
  Administered 2016-10-06: 18 mL via INTRAVENOUS

## 2016-10-08 ENCOUNTER — Other Ambulatory Visit: Payer: Self-pay | Admitting: Oncology

## 2016-10-08 NOTE — Progress Notes (Unsigned)
Called her w good MRI results

## 2016-10-14 ENCOUNTER — Ambulatory Visit: Payer: PRIVATE HEALTH INSURANCE

## 2016-10-16 ENCOUNTER — Ambulatory Visit: Payer: PRIVATE HEALTH INSURANCE

## 2016-10-17 ENCOUNTER — Ambulatory Visit (HOSPITAL_BASED_OUTPATIENT_CLINIC_OR_DEPARTMENT_OTHER): Payer: PRIVATE HEALTH INSURANCE

## 2016-10-17 VITALS — BP 125/88 | HR 69 | Temp 97.1°F | Resp 16

## 2016-10-17 DIAGNOSIS — C50212 Malignant neoplasm of upper-inner quadrant of left female breast: Secondary | ICD-10-CM

## 2016-10-17 DIAGNOSIS — Z170 Estrogen receptor positive status [ER+]: Secondary | ICD-10-CM

## 2016-10-17 DIAGNOSIS — Z5111 Encounter for antineoplastic chemotherapy: Secondary | ICD-10-CM | POA: Diagnosis not present

## 2016-10-17 MED ORDER — GOSERELIN ACETATE 3.6 MG ~~LOC~~ IMPL
3.6000 mg | DRUG_IMPLANT | Freq: Once | SUBCUTANEOUS | Status: AC
  Administered 2016-10-17: 3.6 mg via SUBCUTANEOUS
  Filled 2016-10-17: qty 3.6

## 2016-10-17 NOTE — Patient Instructions (Signed)
Goserelin injection What is this medicine? GOSERELIN (GOE se rel in) is similar to a hormone found in the body. It lowers the amount of sex hormones that the body makes. Men will have lower testosterone levels and women will have lower estrogen levels while taking this medicine. In men, this medicine is used to treat prostate cancer; the injection is either given once per month or once every 12 weeks. A once per month injection (only) is used to treat women with endometriosis, dysfunctional uterine bleeding, or advanced breast cancer. This medicine may be used for other purposes; ask your health care provider or pharmacist if you have questions. COMMON BRAND NAME(S): Zoladex What should I tell my health care provider before I take this medicine? They need to know if you have any of these conditions (some only apply to women): -diabetes -heart disease or previous heart attack -high blood pressure -high cholesterol -kidney disease -osteoporosis or low bone density -problems passing urine -spinal cord injury -stroke -tobacco smoker -an unusual or allergic reaction to goserelin, hormone therapy, other medicines, foods, dyes, or preservatives -pregnant or trying to get pregnant -breast-feeding How should I use this medicine? This medicine is for injection under the skin. It is given by a health care professional in a hospital or clinic setting. Men receive this injection once every 4 weeks or once every 12 weeks. Women will only receive the once every 4 weeks injection. Talk to your pediatrician regarding the use of this medicine in children. Special care may be needed. Overdosage: If you think you have taken too much of this medicine contact a poison control center or emergency room at once. NOTE: This medicine is only for you. Do not share this medicine with others. What if I miss a dose? It is important not to miss your dose. Call your doctor or health care professional if you are unable to  keep an appointment. What may interact with this medicine? -female hormones like estrogen -herbal or dietary supplements like black cohosh, chasteberry, or DHEA -female hormones like testosterone -prasterone This list may not describe all possible interactions. Give your health care provider a list of all the medicines, herbs, non-prescription drugs, or dietary supplements you use. Also tell them if you smoke, drink alcohol, or use illegal drugs. Some items may interact with your medicine. What should I watch for while using this medicine? Visit your doctor or health care professional for regular checks on your progress. Your symptoms may appear to get worse during the first weeks of this therapy. Tell your doctor or healthcare professional if your symptoms do not start to get better or if they get worse after this time. Your bones may get weaker if you take this medicine for a long time. If you smoke or frequently drink alcohol you may increase your risk of bone loss. A family history of osteoporosis, chronic use of drugs for seizures (convulsions), or corticosteroids can also increase your risk of bone loss. Talk to your doctor about how to keep your bones strong. This medicine should stop regular monthly menstration in women. Tell your doctor if you continue to menstrate. Women should not become pregnant while taking this medicine or for 12 weeks after stopping this medicine. Women should inform their doctor if they wish to become pregnant or think they might be pregnant. There is a potential for serious side effects to an unborn child. Talk to your health care professional or pharmacist for more information. Do not breast-feed an infant while taking   this medicine. Men should inform their doctors if they wish to father a child. This medicine may lower sperm counts. Talk to your health care professional or pharmacist for more information. What side effects may I notice from receiving this  medicine? Side effects that you should report to your doctor or health care professional as soon as possible: -allergic reactions like skin rash, itching or hives, swelling of the face, lips, or tongue -bone pain -breathing problems -changes in vision -chest pain -feeling faint or lightheaded, falls -fever, chills -pain, swelling, warmth in the leg -pain, tingling, numbness in the hands or feet -signs and symptoms of low blood pressure like dizziness; feeling faint or lightheaded, falls; unusually weak or tired -stomach pain -swelling of the ankles, feet, hands -trouble passing urine or change in the amount of urine -unusually high or low blood pressure -unusually weak or tired Side effects that usually do not require medical attention (report to your doctor or health care professional if they continue or are bothersome): -change in sex drive or performance -changes in breast size in both males and females -changes in emotions or moods -headache -hot flashes -irritation at site where injected -loss of appetite -skin problems like acne, dry skin -vaginal dryness This list may not describe all possible side effects. Call your doctor for medical advice about side effects. You may report side effects to FDA at 1-800-FDA-1088. Where should I keep my medicine? This drug is given in a hospital or clinic and will not be stored at home. NOTE: This sheet is a summary. It may not cover all possible information. If you have questions about this medicine, talk to your doctor, pharmacist, or health care provider.  2018 [employer] Standard (2013-04-19 11:10:35)  

## 2016-10-21 ENCOUNTER — Encounter: Payer: Self-pay | Admitting: Pharmacist

## 2016-10-21 DIAGNOSIS — Z170 Estrogen receptor positive status [ER+]: Secondary | ICD-10-CM

## 2016-10-21 DIAGNOSIS — C50212 Malignant neoplasm of upper-inner quadrant of left female breast: Secondary | ICD-10-CM

## 2016-10-21 MED ORDER — GADOBENATE DIMEGLUMINE 529 MG/ML IV SOLN
20.0000 mL | Freq: Once | INTRAVENOUS | Status: AC | PRN
  Administered 2016-10-21: 18 mL via INTRAVENOUS

## 2016-10-21 NOTE — Progress Notes (Signed)
Telephone documentation  Study code: [id number]  Spoke with patient over the phone today and reviewed with her the pharmacogenetic information listed below for the four genes of interest of the "Pharmacogenetic analysis of toxicities related to administration of taxanes in breast cancer patients" study. The information below was reviewed with the patient. Offered the patient the option of speaking with a [facility] genetic counselor and she was not interested in making an appointment. Provide my office number to the patient if she had additional questions.  **The buccal swab testing was NOT conducted at a CLIA validated lab. The patient was reminded that the information given was for informational proposes only and should NOT be used to make clinical decisions.   Gene Phenotype  CYP3A4 Normal metabolizer   CYP3A5 Poor metabolizer   SLCO1B1 Normal function  ABCB1 Normal function    Darl Pikes, PharmD, BCPS Hematology/Oncology Clinical Pharmacist [facility] [phone]

## 2016-10-21 NOTE — Progress Notes (Signed)
Telephone documentation  Study code: [id number]  Attempted to call patient and review with her the pharmacogenetic information that was obtained for the "Pharmacogenetic analysis of toxicities related to administration of taxanes in breast cancer patients" study. Unable to reach patient. Provided my office number for the patient to call back.  Darl Pikes, PharmD, BCPS Hematology/Oncology Clinical Pharmacist [facility] [phone]

## 2016-10-24 ENCOUNTER — Ambulatory Visit (HOSPITAL_BASED_OUTPATIENT_CLINIC_OR_DEPARTMENT_OTHER): Payer: PRIVATE HEALTH INSURANCE

## 2016-10-24 ENCOUNTER — Ambulatory Visit (HOSPITAL_BASED_OUTPATIENT_CLINIC_OR_DEPARTMENT_OTHER): Payer: PRIVATE HEALTH INSURANCE | Admitting: Oncology

## 2016-10-24 ENCOUNTER — Other Ambulatory Visit (HOSPITAL_BASED_OUTPATIENT_CLINIC_OR_DEPARTMENT_OTHER): Payer: PRIVATE HEALTH INSURANCE

## 2016-10-24 VITALS — BP 144/85 | HR 68 | Temp 98.3°F | Resp 18 | Ht 67.0 in | Wt 200.6 lb

## 2016-10-24 DIAGNOSIS — Z170 Estrogen receptor positive status [ER+]: Secondary | ICD-10-CM | POA: Diagnosis not present

## 2016-10-24 DIAGNOSIS — C50212 Malignant neoplasm of upper-inner quadrant of left female breast: Secondary | ICD-10-CM

## 2016-10-24 DIAGNOSIS — Z79810 Long term (current) use of selective estrogen receptor modulators (SERMs): Secondary | ICD-10-CM | POA: Diagnosis not present

## 2016-10-24 DIAGNOSIS — Z5112 Encounter for antineoplastic immunotherapy: Secondary | ICD-10-CM | POA: Diagnosis not present

## 2016-10-24 LAB — COMPREHENSIVE METABOLIC PANEL
ALT: 37 U/L (ref 0–55)
AST: 29 U/L (ref 5–34)
Albumin: 3.8 g/dL (ref 3.5–5.0)
Alkaline Phosphatase: 72 U/L (ref 40–150)
Anion Gap: 6 mEq/L (ref 3–11)
BUN: 9.5 mg/dL (ref 7.0–26.0)
CO2: 28 mEq/L (ref 22–29)
Calcium: 9.8 mg/dL (ref 8.4–10.4)
Chloride: 107 mEq/L (ref 98–109)
Creatinine: 0.8 mg/dL (ref 0.6–1.1)
EGFR: >90 mL/min/{1.73_m2} (ref >90)
Glucose: 91 mg/dl (ref 70–140)
Potassium: 4.3 mEq/L (ref 3.5–5.1)
Sodium: 141 mEq/L (ref 136–145)
Total Bilirubin: 0.33 mg/dL (ref 0.20–1.20)
Total Protein: 7.4 g/dL (ref 6.4–8.3)

## 2016-10-24 LAB — CBC WITH DIFFERENTIAL/PLATELET
BASO%: 0.5 % (ref 0.0–2.0)
Basophils Absolute: 0 10*3/uL (ref 0.0–0.1)
EOS%: 2.1 % (ref 0.0–7.0)
Eosinophils Absolute: 0.1 10*3/uL (ref 0.0–0.5)
HCT: 38.5 % (ref 34.8–46.6)
HGB: 12 g/dL (ref 11.6–15.9)
LYMPH%: 32 % (ref 14.0–49.7)
MCH: 26 pg (ref 25.1–34.0)
MCHC: 31.2 g/dL — ABNORMAL LOW (ref 31.5–36.0)
MCV: 83.5 fL (ref 79.5–101.0)
MONO#: 0.6 10*3/uL (ref 0.1–0.9)
MONO%: 16.2 % — ABNORMAL HIGH (ref 0.0–14.0)
NEUT#: 1.9 10*3/uL (ref 1.5–6.5)
NEUT%: 49.2 % (ref 38.4–76.8)
Platelets: 162 10*3/uL (ref 145–400)
RBC: 4.61 10*6/uL (ref 3.70–5.45)
RDW: 15.5 % — ABNORMAL HIGH (ref 11.2–14.5)
WBC: 3.9 10*3/uL (ref 3.9–10.3)
lymph#: 1.2 10*3/uL (ref 0.9–3.3)

## 2016-10-24 MED ORDER — SODIUM CHLORIDE 0.9% FLUSH
10.0000 mL | INTRAVENOUS | Status: DC | PRN
  Administered 2016-10-24: 10 mL
  Filled 2016-10-24: qty 10

## 2016-10-24 MED ORDER — HEPARIN SOD (PORK) LOCK FLUSH 100 UNIT/ML IV SOLN
500.0000 [IU] | Freq: Once | INTRAVENOUS | Status: AC | PRN
  Administered 2016-10-24: 500 [IU]
  Filled 2016-10-24: qty 5

## 2016-10-24 MED ORDER — DIPHENHYDRAMINE HCL 25 MG PO CAPS
ORAL_CAPSULE | ORAL | Status: AC
  Filled 2016-10-24: qty 1

## 2016-10-24 MED ORDER — TRASTUZUMAB CHEMO 150 MG IV SOLR
6.0000 mg/kg | Freq: Once | INTRAVENOUS | Status: AC
  Administered 2016-10-24: 504 mg via INTRAVENOUS
  Filled 2016-10-24: qty 24

## 2016-10-24 MED ORDER — LIDOCAINE-PRILOCAINE 2.5-2.5 % EX CREA: 1.0000 "application " | TOPICAL_CREAM | CUTANEOUS | 0 refills | Status: DC | PRN

## 2016-10-24 MED ORDER — ACETAMINOPHEN 325 MG PO TABS
ORAL_TABLET | ORAL | Status: AC
  Filled 2016-10-24: qty 2

## 2016-10-24 MED ORDER — DIPHENHYDRAMINE HCL 25 MG PO CAPS
25.0000 mg | ORAL_CAPSULE | Freq: Once | ORAL | Status: AC
  Administered 2016-10-24: 25 mg via ORAL

## 2016-10-24 MED ORDER — ACETAMINOPHEN 325 MG PO TABS
650.0000 mg | ORAL_TABLET | Freq: Once | ORAL | Status: AC
  Administered 2016-10-24: 650 mg via ORAL

## 2016-10-24 MED ORDER — SODIUM CHLORIDE 0.9 % IV SOLN
Freq: Once | INTRAVENOUS | Status: AC
  Administered 2016-10-24: 10:00:00 via INTRAVENOUS

## 2016-10-24 NOTE — Progress Notes (Signed)
[facility]  [phone] [phone]) [phone]     ID: KINZEY SHERIFF DOB: 28-Mar-1980  MR#: [id number]  DGL#:[id number]  Patient Care Team: [name], MD as PCP - General (Family Medicine) Stark Klein, MD as Consulting Physician (General Surgery) Magrinat, Virgie Dad, MD as Consulting Physician (Oncology) Eppie Gibson, MD as Attending Physician (Radiation Oncology) Delice Bison Charlestine Massed, NP as Nurse Practitioner (Hematology and Oncology) Chauncey Cruel, MD OTHER MD:  CHIEF COMPLAINT:  Estrogen receptor positive breast cancer  CURRENT TREATMENT: anti-HER-2 immunotherapy, goserelin, tamoxifen  BREAST CANCER HISTORY: From the original intake note:  Alexia noted some itching on her breasts and since this was the symptom that preceded her grandmother's breast cancer she underwent baseline bilateral screening mammography at [facility] 01/10/2016. The breast density was category B. In the left breast central to the nipple there was a 1.7 cm irregular high density mass with pleomorphic calcifications. Accordingly the patient was brought back 01/14/2016 for left diagnostic mammography and ultrasonography. This confirmed a 0.8 cm irregular mass in the left breast at the 10:00 position. 0.5 cm from the nipple lateral and posterior to that there was a 0.2 cm cluster of amorphous calcifications with an associated density. Anterior to the index mass was a 0.5 cm cluster of amorphous calcifications. All 3 findings taken together 3 cm. Ultrasound of the left breast found a 1 cm irregular mass in the left breast at the 10:00 position retroareolarly. There were no other sonographic abnormalities and the left axilla was sonographically benign  On 01/15/2016 the patient underwent core needle biopsy of the left breast index lesion at 10:00, and this found invasive ductal carcinoma, E-cadherin positive, estrogen receptor 90% positive, progesterone receptor 40% positive, both with  moderate staining intensity, with an MIB-1 of 5%, but HER-2 amplified, with a signals ratio of 2.41 and the number per cell 6.50. One of the 2 areas of abnormal calcifications, the more posterior one measuring 0.2 cm, was also biopsied and showed only atypical ductal hyperplasia ([id number]).  Her subsequent history is as detailed below  INTERVAL HISTORY: Tariya returns today for follow-up and treatment of her triple positive breast cancer. She is currently on tamoxifen, which she tolerates well. She still has hot flashes particularly at night. She does not like taking the gabapentin and has stopped that. Luckily she does not have significant problems with hot flashes during the day. Vaginal wetness is not an issue.   She is also on goserelin. This was started 06/17/2016. She of course is having no periods. She would like to switch it to every 3 months if possible.  Finally she continues on trastuzumab every 3 weeks. Her most recent echocardiogram was 09/26/2016. It showed an ejection fraction in the 55-60%. She tolerates that well   REVIEW OF SYSTEMS: Douglas was having some persistent headaches. We decided to evaluate that with a brain MRI which was performed 10/06/2016. This was unremarkable. She still has some headaches, they tend to localize above 1 or the other, and she uses Tylenol and Aleve for this. They recently went on vacation and she really enjoyed herself. She started exercising, hit a wall, had to take some days off, and is now going back to the gym almost every day. She continues to work full-time, mostly outside of her home. A detailed review of systems today was otherwise benign.  PAST MEDICAL HISTORY: Past Medical History:  Diagnosis Date  . Anemia    as a child  .  Anxiety   . Cholestasis   . Elevated liver enzymes    during pregnancy March 2015  . Family history of breast cancer   . Family history of prostate cancer   . GERD (gastroesophageal reflux disease)     one episode  . Headache   . History of radiation therapy 06/11/16- 07/23/16   Left Breast 50 Gy in 25 fractions, Left Breast boost 10 Gy in 5 fractions.   . Hypothyroidism   . Malignant neoplasm of upper-inner quadrant of left female breast ([place]) 01/16/2016    PAST SURGICAL HISTORY: Past Surgical History:  Procedure Laterality Date  . BREAST LUMPECTOMY WITH RADIOACTIVE SEED AND SENTINEL LYMPH NODE BIOPSY Left 03/13/2016   Procedure: LEFT BREAST LUMPECTOMY WITH RADIOACTIVE SEED X 2 AND SENTINEL LYMPH NODE BIOPSY;  Surgeon: Stark Klein, MD;  Location: [facility];  Service: General;  Laterality: Left;  . NO PAST SURGERIES    . PORTACATH PLACEMENT Right 03/13/2016   Procedure: INSERTION PORT-A-CATH;  Surgeon: Stark Klein, MD;  Location: [facility];  Service: General;  Laterality: Right;    FAMILY HISTORY Family History  Problem Relation Age of Onset  . Hypertension Mother   . Hypertension Father   . Prostate cancer Father   . Liver cancer Maternal Grandmother   . Breast cancer Paternal Grandmother        dx in her 67s  . Lung cancer Paternal Aunt        smoker  . Heart attack Paternal Grandfather   . Liver cancer Other   The patient's parents are alive, in their mid 39s as of November 2017. The patient father was diagnosed with prostate cancer at age 23. A paternal aunt was diagnosed with lung cancer in her late 62s. Also the paternal grandmother was diagnosed with breast cancer at the age of 51. On the mother's side there are 2 cases of liver cancer, age of diagnosis unknown.  GYNECOLOGIC HISTORY:  No LMP recorded.  Menarche age 57, first live birth age 5, she is [place] P2. She still having periods, most recently 01/21/2016, but they are no longer regular. She remotely used a number ring and oral contraceptives but stopped in 2016.Currently there use barrier methods for contraception.   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 Her  husband [name] works as a Printmaker for [employer] (for example he worked on our (). Their children are [name] and [name],, aged 63 and 2 as of November 2017. Attention and is not a church attender    ADVANCED DIRECTIVES: Not in place   HEALTH MAINTENANCE: Social History  Substance Use Topics  . Smoking status: Never Smoker  . Smokeless tobacco: Never Used  . Alcohol use No     Colonoscopy:  PAP:2016  Bone density:   Allergies  Allergen Reactions  . Amoxicillin Diarrhea and Nausea Only    Has patient had a PCN reaction causing immediate rash, facial/tongue/throat swelling, SOB or lightheadedness with hypotension: No Has patient had a PCN reaction causing severe rash involving mucus membranes or skin necrosis: No Has patient had a PCN reaction that required hospitalization No Has patient had a PCN reaction occurring within the last 10 years: Yes If all of the above answers are "NO", then may proceed with Cephalosporin use.     Current Outpatient Prescriptions  Medication Sig Dispense Refill  . acetaminophen (TYLENOL) 500 MG tablet Take 500-1,000 mg by mouth every 6 (six) hours  as needed (for headaches.).    Marland Kitchen butalbital-acetaminophen-caffeine (FIORICET, ESGIC) 50-325-40 MG tablet Take 1 tablet by mouth every 6 (six) hours as needed for headache. 30 tablet 0  . fluticasone (FLONASE) 50 MCG/ACT nasal spray Place 1-2 sprays into both nostrils daily as needed for allergies or rhinitis.    Marland Kitchen gabapentin (NEURONTIN) 300 MG capsule Take 1 capsule (300 mg total) by mouth at bedtime. (Patient not taking: Reported on 08/22/2016) 30 capsule 2  . goserelin (ZOLADEX) 10.8 MG injection Inject 10.8 mg into the skin every 30 (thirty) days. She is not sure of the exact dose    . ibuprofen (ADVIL,MOTRIN) 200 MG tablet Take 400 mg by mouth every 8 (eight) hours as needed (for pain.).    Marland Kitchen LORazepam (ATIVAN) 0.5 MG tablet Take 1 tablet (0.5 mg total) by mouth at bedtime as needed for  anxiety. (Patient not taking: Reported on 08/22/2016) 30 tablet 1  . Multiple Vitamins-Minerals (MULTIVITAMIN GUMMIES ADULT PO) Take 2 tablets by mouth daily.    . tamoxifen (NOLVADEX) 20 MG tablet Take 1 tablet (20 mg total) by mouth daily. 30 tablet 3   No current facility-administered medications for this visit.     OBJECTIVE: Young African American woman Who appears well  Vitals:   10/24/16 0914  BP: (!) 144/85  Pulse: 68  Resp: 18  Temp: 98.3 F (36.8 C)  SpO2: 100%     Body mass index is 31.42 kg/m.    ECOG FS:1 - Symptomatic but completely ambulatory   Sclerae unicteric, pupils round and equal Oropharynx clear and moist No cervical or supraclavicular adenopathy Lungs no rales or rhonchi Heart regular rate and rhythm Abd soft, nontender, positive bowel sounds MSK no focal spinal tenderness, no upper extremity lymphedema Neuro: nonfocal, well oriented, appropriate affect Breasts: The right breast is unremarkable. The left breast is status post lumpectomy followed by radiation with no evidence of local recurrence. There is still some hyperpigmentation. The cosmetic result is very good. Both axillae are benign.  LAB RESULTS:  CMP     Component Value Date/Time   NA 141 09/26/2016 1453   K 4.4 09/26/2016 1453   CL 107 03/07/2016 1612   CO2 28 09/26/2016 1453   GLUCOSE 104 09/26/2016 1453   BUN 9.8 09/26/2016 1453   CREATININE 0.8 09/26/2016 1453   CALCIUM 9.7 09/26/2016 1453   PROT 7.4 09/26/2016 1453   ALBUMIN 3.9 09/26/2016 1453   AST 31 09/26/2016 1453   ALT 40 09/26/2016 1453   ALKPHOS 77 09/26/2016 1453   BILITOT <0.22 09/26/2016 1453   GFRNONAA >60 03/07/2016 1612   GFRAA >60 03/07/2016 1612    INo results found for: SPEP, UPEP  Lab Results  Component Value Date   WBC 3.9 10/24/2016   NEUTROABS 1.9 10/24/2016   HGB 12.0 10/24/2016   HCT 38.5 10/24/2016   MCV 83.5 10/24/2016   PLT 162 10/24/2016      Chemistry      Component Value Date/Time    NA 141 09/26/2016 1453   K 4.4 09/26/2016 1453   CL 107 03/07/2016 1612   CO2 28 09/26/2016 1453   BUN 9.8 09/26/2016 1453   CREATININE 0.8 09/26/2016 1453      Component Value Date/Time   CALCIUM 9.7 09/26/2016 1453   ALKPHOS 77 09/26/2016 1453   AST 31 09/26/2016 1453   ALT 40 09/26/2016 1453   BILITOT <0.22 09/26/2016 1453       No results found for: LABCA2  No components found for: LABCA125  No results for input(s): INR in the last 168 hours.  Urinalysis    Component Value Date/Time   COLORURINE YELLOW 10/14/2012 1954   APPEARANCEUR CLEAR 10/14/2012 1954   LABSPEC >1.030 (H) 10/14/2012 1954   PHURINE 6.0 10/14/2012 1954   GLUCOSEU NEGATIVE 10/14/2012 1954   HGBUR TRACE (A) 10/14/2012 1954   BILIRUBINUR NEGATIVE 10/14/2012 1954   KETONESUR 15 (A) 10/14/2012 1954   PROTEINUR NEGATIVE 10/14/2012 1954   UROBILINOGEN 0.2 10/14/2012 1954   NITRITE NEGATIVE 10/14/2012 1954   LEUKOCYTESUR NEGATIVE 10/14/2012 1954     STUDIES: Mr Jeri Cos KP Contrast  Result Date: 10/07/2016 CLINICAL DATA:  36 y/o F; breast cancer diagnosed 12/2015. Patient complains of headaches since February located behind the eyes. EXAM: MRI HEAD WITHOUT AND WITH CONTRAST TECHNIQUE: Multiplanar, multiecho pulse sequences of the brain and surrounding structures were obtained without and with intravenous contrast. CONTRAST:  18 cc MultiHance COMPARISON:  None. FINDINGS: Brain: No acute infarction, hemorrhage, hydrocephalus, extra-axial collection or mass lesion. No abnormal enhancement. Incidental 4 mm pineal cyst. No white matter lesion or structural abnormality. Vascular: Normal flow voids. Skull and upper cervical spine: Normal marrow signal. Sinuses/Orbits: Mild ethmoid sinus mucosal thickening. No abnormal signal of mastoid air cells. Normal orbits. Other: None. IMPRESSION: Unremarkable MRI of the brain for age. No acute intracranial abnormality or metastatic disease identified. Electronically Signed    By: Kristine Garbe M.D.   On: 10/07/2016 03:46     ELIGIBLE FOR AVAILABLE RESEARCH PROTOCOL: no  ASSESSMENT: 36 y.o. [place] woman status post left breast upper inner quadrant biopsy 01/15/2016 for a clinical T1c N0, stage IA invasive ductal carcinoma, E-cadherin positive, estrogen receptor 90% positive, progesterone receptor 40% positive, both with moderate staining intensity, with an MIB-1 of 5%, but HER-2 amplified, with a signals ratio of 2.41 and the number per cell 6.50.  (a) One of 2 areas of abnormal calcifications, the more posterior one measuring 0.2 cm, was also biopsied and showed only atypical ductal hyperplasia  (b) a 4.1 cm area of non-masslike enhancement in the left breast underwent biopsy 02/19/2016 showing only fibrocystic changes  (1) tamoxifen started 01/23/2016 neoadjuvantly  (2) genetics testing 02/22/2016 through the Breast/Ovarian gene panel offered by [facility] found no deleterious mutations in  ATM, BARD1, BRCA1, BRCA2, BRIP1, CDH1, CHEK2, EPCAM, FANCC, MLH1, MSH2, MSH6, NBN, PALB2, PMS2, PTEN, RAD51C, RAD51D, TP53, and XRCC2.     (3) status post left lumpectomy and sentinel lymph node sampling 03/13/2016 for an mpT1c pN0, stage IA invasive ductal carcinoma, grade 2, with negative margins.  (4) paclitaxel weekly 12 and trastuzumab every 21 days beginning on 04/10/2016.  (a) paclitaxel discontinued after 5 doses because of atypical neuropathy symptoms. Last dose 04/28/2016  (5) trastuzumab to be continued to complete a year--through February 2019  (a) echocardiogram 02/11/2016 shows ejection fraction in the 60-65% range.  (b) echo 05/28/2916 shoe\ws EF in the 65-70% range  (6) adjuvant radiation 06/11/16 - 07/23/16 Site/dose:    1) Left Breast / 50 Gy in 25 fractions 2) Left Breast Boost / 10 Gy in 5 fractions  (7) tamoxifen resumed to 07/25/2016  (8) patient resumed menses post chemo, April 2018  (a) goserelin started 06/17/2016  PLAN: Carel is  now 8 months out from definitive surgery for her breast cancer. She has completed her local treatment and continues on anti-estrogens and anti-HER-2 therapy, with good tolerance.  She is also on goserelin. Her periods had resumed after chemotherapy. She has  had no periods since her second dose.  She would like to switch to goserelin to every 3 months. I written the appropriate orders and I have added and estradiol level to be checked about every 3 months to make sure we are having a successful ovarian suppression.  She will need a repeat echocardiogram sometime in November. She will also have mammography that month.  She receives trastuzumab every 21 days. She will see [place] every third trastuzumab dose.  She knows to call for any problems that may develop before the next visit here.  Chauncey Cruel, MD   10/24/2016 9:16 AM Medical Oncology and Hematology [facility] [address] [place], [place] [address] Tel. [phone]    Fax. [phone]

## 2016-10-24 NOTE — Patient Instructions (Signed)
[facility] Discharge Instructions for Patients Receiving Chemotherapy  Today you received the following chemotherapy agents:  Herceptin  To help prevent nausea and vomiting after your treatment, we encourage you to take your nausea medication as prescribed.   If you develop nausea and vomiting that is not controlled by your nausea medication, call the clinic.   BELOW ARE SYMPTOMS THAT SHOULD BE REPORTED IMMEDIATELY:  *FEVER GREATER THAN 100.5 F  *CHILLS WITH OR WITHOUT FEVER  NAUSEA AND VOMITING THAT IS NOT CONTROLLED WITH YOUR NAUSEA MEDICATION  *UNUSUAL SHORTNESS OF BREATH  *UNUSUAL BRUISING OR BLEEDING  TENDERNESS IN MOUTH AND THROAT WITH OR WITHOUT PRESENCE OF ULCERS  *URINARY PROBLEMS  *BOWEL PROBLEMS  UNUSUAL RASH Items with * indicate a potential emergency and should be followed up as soon as possible.  Feel free to call the clinic you have any questions or concerns. The clinic phone number is [phone].  Please show the CHEMO ALERT CARD at check-in to the Emergency Department and triage nurse.   

## 2016-11-11 ENCOUNTER — Ambulatory Visit: Payer: PRIVATE HEALTH INSURANCE

## 2016-11-12 ENCOUNTER — Ambulatory Visit (HOSPITAL_BASED_OUTPATIENT_CLINIC_OR_DEPARTMENT_OTHER): Payer: PRIVATE HEALTH INSURANCE

## 2016-11-12 VITALS — BP 141/85 | HR 67 | Temp 97.5°F | Resp 18

## 2016-11-12 DIAGNOSIS — Z5111 Encounter for antineoplastic chemotherapy: Secondary | ICD-10-CM | POA: Diagnosis not present

## 2016-11-12 DIAGNOSIS — Z170 Estrogen receptor positive status [ER+]: Secondary | ICD-10-CM

## 2016-11-12 DIAGNOSIS — C50212 Malignant neoplasm of upper-inner quadrant of left female breast: Secondary | ICD-10-CM

## 2016-11-12 MED ORDER — GOSERELIN ACETATE 3.6 MG ~~LOC~~ IMPL
10.8000 mg | DRUG_IMPLANT | Freq: Once | SUBCUTANEOUS | Status: AC
  Administered 2016-11-12: 10.8 mg via SUBCUTANEOUS
  Filled 2016-11-12: qty 10.8

## 2016-11-14 ENCOUNTER — Ambulatory Visit (HOSPITAL_BASED_OUTPATIENT_CLINIC_OR_DEPARTMENT_OTHER): Payer: PRIVATE HEALTH INSURANCE

## 2016-11-14 ENCOUNTER — Other Ambulatory Visit (HOSPITAL_BASED_OUTPATIENT_CLINIC_OR_DEPARTMENT_OTHER): Payer: PRIVATE HEALTH INSURANCE

## 2016-11-14 VITALS — BP 133/83 | HR 71 | Temp 98.9°F | Resp 16

## 2016-11-14 DIAGNOSIS — C50212 Malignant neoplasm of upper-inner quadrant of left female breast: Secondary | ICD-10-CM

## 2016-11-14 DIAGNOSIS — Z170 Estrogen receptor positive status [ER+]: Secondary | ICD-10-CM

## 2016-11-14 DIAGNOSIS — Z5112 Encounter for antineoplastic immunotherapy: Secondary | ICD-10-CM

## 2016-11-14 LAB — COMPREHENSIVE METABOLIC PANEL
ALT: 64 U/L — ABNORMAL HIGH (ref 0–55)
AST: 37 U/L — ABNORMAL HIGH (ref 5–34)
Albumin: 3.8 g/dL (ref 3.5–5.0)
Alkaline Phosphatase: 81 U/L (ref 40–150)
Anion Gap: 9 mEq/L (ref 3–11)
BUN: 8.7 mg/dL (ref 7.0–26.0)
CO2: 29 mEq/L (ref 22–29)
Calcium: 9.7 mg/dL (ref 8.4–10.4)
Chloride: 105 mEq/L (ref 98–109)
Creatinine: 0.8 mg/dL (ref 0.6–1.1)
EGFR: >90 mL/min/{1.73_m2} (ref >90)
Glucose: 111 mg/dl (ref 70–140)
Potassium: 4 mEq/L (ref 3.5–5.1)
Sodium: 142 mEq/L (ref 136–145)
Total Bilirubin: 0.23 mg/dL (ref 0.20–1.20)
Total Protein: 7.4 g/dL (ref 6.4–8.3)

## 2016-11-14 LAB — CBC WITH DIFFERENTIAL/PLATELET
BASO%: 1 % (ref 0.0–2.0)
Basophils Absolute: 0 10*3/uL (ref 0.0–0.1)
EOS%: 2.3 % (ref 0.0–7.0)
Eosinophils Absolute: 0.1 10*3/uL (ref 0.0–0.5)
HCT: 37.2 % (ref 34.8–46.6)
HGB: 11.9 g/dL (ref 11.6–15.9)
LYMPH%: 32.6 % (ref 14.0–49.7)
MCH: 26.2 pg (ref 25.1–34.0)
MCHC: 31.9 g/dL (ref 31.5–36.0)
MCV: 82.1 fL (ref 79.5–101.0)
MONO#: 0.5 10*3/uL (ref 0.1–0.9)
MONO%: 14.9 % — ABNORMAL HIGH (ref 0.0–14.0)
NEUT#: 1.8 10*3/uL (ref 1.5–6.5)
NEUT%: 49.2 % (ref 38.4–76.8)
Platelets: 169 10*3/uL (ref 145–400)
RBC: 4.53 10*6/uL (ref 3.70–5.45)
RDW: 15.4 % — ABNORMAL HIGH (ref 11.2–14.5)
WBC: 3.6 10*3/uL — ABNORMAL LOW (ref 3.9–10.3)
lymph#: 1.2 10*3/uL (ref 0.9–3.3)

## 2016-11-14 MED ORDER — HEPARIN SOD (PORK) LOCK FLUSH 100 UNIT/ML IV SOLN
500.0000 [IU] | Freq: Once | INTRAVENOUS | Status: AC | PRN
  Administered 2016-11-14: 500 [IU]
  Filled 2016-11-14: qty 5

## 2016-11-14 MED ORDER — ACETAMINOPHEN 325 MG PO TABS
650.0000 mg | ORAL_TABLET | Freq: Once | ORAL | Status: AC
  Administered 2016-11-14: 650 mg via ORAL

## 2016-11-14 MED ORDER — ACETAMINOPHEN 325 MG PO TABS
ORAL_TABLET | ORAL | Status: AC
  Filled 2016-11-14: qty 2

## 2016-11-14 MED ORDER — DIPHENHYDRAMINE HCL 25 MG PO CAPS
25.0000 mg | ORAL_CAPSULE | Freq: Once | ORAL | Status: AC
  Administered 2016-11-14: 25 mg via ORAL

## 2016-11-14 MED ORDER — SODIUM CHLORIDE 0.9 % IV SOLN
Freq: Once | INTRAVENOUS | Status: AC
  Administered 2016-11-14: 16:00:00 via INTRAVENOUS

## 2016-11-14 MED ORDER — TRASTUZUMAB CHEMO 150 MG IV SOLR
6.0000 mg/kg | Freq: Once | INTRAVENOUS | Status: AC
  Administered 2016-11-14: 504 mg via INTRAVENOUS
  Filled 2016-11-14: qty 24

## 2016-11-14 MED ORDER — DIPHENHYDRAMINE HCL 25 MG PO CAPS
ORAL_CAPSULE | ORAL | Status: AC
  Filled 2016-11-14: qty 1

## 2016-11-14 MED ORDER — SODIUM CHLORIDE 0.9% FLUSH
10.0000 mL | INTRAVENOUS | Status: DC | PRN
  Administered 2016-11-14: 10 mL
  Filled 2016-11-14: qty 10

## 2016-11-14 NOTE — Patient Instructions (Signed)
[facility] Discharge Instructions for Patients Receiving Chemotherapy  Today you received the following chemotherapy agents trastuzumab (Herceptin) To help prevent nausea and vomiting after your treatment, we encourage you to take your nausea medication as prescribed.  If you develop nausea and vomiting that is not controlled by your nausea medication, call the clinic.   BELOW ARE SYMPTOMS THAT SHOULD BE REPORTED IMMEDIATELY:  *FEVER GREATER THAN 100.5 F  *CHILLS WITH OR WITHOUT FEVER  NAUSEA AND VOMITING THAT IS NOT CONTROLLED WITH YOUR NAUSEA MEDICATION  *UNUSUAL SHORTNESS OF BREATH  *UNUSUAL BRUISING OR BLEEDING  TENDERNESS IN MOUTH AND THROAT WITH OR WITHOUT PRESENCE OF ULCERS  *URINARY PROBLEMS  *BOWEL PROBLEMS  UNUSUAL RASH Items with * indicate a potential emergency and should be followed up as soon as possible.  Feel free to call the clinic you have any questions or concerns. The clinic phone number is [phone].  Please show the [place] at check-in to the Emergency Department and triage nurse.

## 2016-11-15 LAB — FOLLICLE STIMULATING HORMONE: FSH: 1.4 m[IU]/mL

## 2016-11-18 ENCOUNTER — Telehealth (HOSPITAL_COMMUNITY): Payer: Self-pay | Admitting: Vascular Surgery

## 2016-11-18 NOTE — Telephone Encounter (Signed)
Left pt message to make f/u appt w/ db w/ echo in Nov

## 2016-11-19 NOTE — Telephone Encounter (Signed)
Patient called back and was scheduled for 01/14/17 for Echo and f/u with Dr. Haroldine Laws.

## 2016-12-02 LAB — ESTRADIOL, ULTRA SENS: Estradiol, Sensitive: 14 pg/mL

## 2016-12-05 ENCOUNTER — Other Ambulatory Visit (HOSPITAL_BASED_OUTPATIENT_CLINIC_OR_DEPARTMENT_OTHER): Payer: PRIVATE HEALTH INSURANCE

## 2016-12-05 ENCOUNTER — Ambulatory Visit (HOSPITAL_BASED_OUTPATIENT_CLINIC_OR_DEPARTMENT_OTHER): Payer: PRIVATE HEALTH INSURANCE

## 2016-12-05 VITALS — BP 129/81 | HR 69 | Temp 98.6°F | Resp 16

## 2016-12-05 DIAGNOSIS — C50212 Malignant neoplasm of upper-inner quadrant of left female breast: Secondary | ICD-10-CM | POA: Diagnosis not present

## 2016-12-05 DIAGNOSIS — Z5112 Encounter for antineoplastic immunotherapy: Secondary | ICD-10-CM | POA: Diagnosis not present

## 2016-12-05 DIAGNOSIS — Z170 Estrogen receptor positive status [ER+]: Secondary | ICD-10-CM

## 2016-12-05 LAB — COMPREHENSIVE METABOLIC PANEL
ALT: 25 U/L (ref 0–55)
AST: 21 U/L (ref 5–34)
Albumin: 3.8 g/dL (ref 3.5–5.0)
Alkaline Phosphatase: 87 U/L (ref 40–150)
Anion Gap: 9 mEq/L (ref 3–11)
BUN: 10.7 mg/dL (ref 7.0–26.0)
CO2: 28 mEq/L (ref 22–29)
Calcium: 9.7 mg/dL (ref 8.4–10.4)
Chloride: 106 mEq/L (ref 98–109)
Creatinine: 0.8 mg/dL (ref 0.6–1.1)
EGFR: >60 mL/min/{1.73_m2} (ref >60)
Glucose: 118 mg/dl (ref 70–140)
Potassium: 4 mEq/L (ref 3.5–5.1)
Sodium: 143 mEq/L (ref 136–145)
Total Bilirubin: <0.22 mg/dL (ref 0.20–1.20)
Total Protein: 7.4 g/dL (ref 6.4–8.3)

## 2016-12-05 LAB — CBC WITH DIFFERENTIAL/PLATELET
BASO%: 0.9 % (ref 0.0–2.0)
Basophils Absolute: 0 10*3/uL (ref 0.0–0.1)
EOS%: 2.1 % (ref 0.0–7.0)
Eosinophils Absolute: 0.1 10*3/uL (ref 0.0–0.5)
HCT: 37.2 % (ref 34.8–46.6)
HGB: 12.1 g/dL (ref 11.6–15.9)
LYMPH%: 32.5 % (ref 14.0–49.7)
MCH: 26.9 pg (ref 25.1–34.0)
MCHC: 32.5 g/dL (ref 31.5–36.0)
MCV: 82.8 fL (ref 79.5–101.0)
MONO#: 0.4 10*3/uL (ref 0.1–0.9)
MONO%: 10.5 % (ref 0.0–14.0)
NEUT#: 2.2 10*3/uL (ref 1.5–6.5)
NEUT%: 54 % (ref 38.4–76.8)
Platelets: 160 10*3/uL (ref 145–400)
RBC: 4.49 10*6/uL (ref 3.70–5.45)
RDW: 14.4 % (ref 11.2–14.5)
WBC: 4.2 10*3/uL (ref 3.9–10.3)
lymph#: 1.4 10*3/uL (ref 0.9–3.3)

## 2016-12-05 MED ORDER — SODIUM CHLORIDE 0.9% FLUSH
10.0000 mL | INTRAVENOUS | Status: DC | PRN
  Administered 2016-12-05: 10 mL
  Filled 2016-12-05: qty 10

## 2016-12-05 MED ORDER — DIPHENHYDRAMINE HCL 25 MG PO CAPS
25.0000 mg | ORAL_CAPSULE | Freq: Once | ORAL | Status: AC
  Administered 2016-12-05: 25 mg via ORAL

## 2016-12-05 MED ORDER — HEPARIN SOD (PORK) LOCK FLUSH 100 UNIT/ML IV SOLN
500.0000 [IU] | Freq: Once | INTRAVENOUS | Status: AC | PRN
  Administered 2016-12-05: 500 [IU]
  Filled 2016-12-05: qty 5

## 2016-12-05 MED ORDER — SODIUM CHLORIDE 0.9 % IV SOLN
Freq: Once | INTRAVENOUS | Status: AC
  Administered 2016-12-05: 16:00:00 via INTRAVENOUS

## 2016-12-05 MED ORDER — ACETAMINOPHEN 325 MG PO TABS
ORAL_TABLET | ORAL | Status: AC
  Filled 2016-12-05: qty 2

## 2016-12-05 MED ORDER — ACETAMINOPHEN 325 MG PO TABS
650.0000 mg | ORAL_TABLET | Freq: Once | ORAL | Status: AC
  Administered 2016-12-05: 650 mg via ORAL

## 2016-12-05 MED ORDER — SODIUM CHLORIDE 0.9 % IV SOLN
6.0000 mg/kg | Freq: Once | INTRAVENOUS | Status: AC
  Administered 2016-12-05: 504 mg via INTRAVENOUS
  Filled 2016-12-05: qty 24

## 2016-12-05 MED ORDER — DIPHENHYDRAMINE HCL 25 MG PO CAPS
ORAL_CAPSULE | ORAL | Status: AC
  Filled 2016-12-05: qty 1

## 2016-12-05 NOTE — Patient Instructions (Signed)
[facility] Discharge Instructions for Patients Receiving Chemotherapy  Today you received the following chemotherapy agents trastuzumab (Herceptin) To help prevent nausea and vomiting after your treatment, we encourage you to take your nausea medication as prescribed.  If you develop nausea and vomiting that is not controlled by your nausea medication, call the clinic.   BELOW ARE SYMPTOMS THAT SHOULD BE REPORTED IMMEDIATELY:  *FEVER GREATER THAN 100.5 F  *CHILLS WITH OR WITHOUT FEVER  NAUSEA AND VOMITING THAT IS NOT CONTROLLED WITH YOUR NAUSEA MEDICATION  *UNUSUAL SHORTNESS OF BREATH  *UNUSUAL BRUISING OR BLEEDING  TENDERNESS IN MOUTH AND THROAT WITH OR WITHOUT PRESENCE OF ULCERS  *URINARY PROBLEMS  *BOWEL PROBLEMS  UNUSUAL RASH Items with * indicate a potential emergency and should be followed up as soon as possible.  Feel free to call the clinic you have any questions or concerns. The clinic phone number is [phone].  Please show the [facility] at check-in to the Emergency Department and triage nurse.

## 2016-12-10 ENCOUNTER — Encounter: Payer: Self-pay | Admitting: Adult Health

## 2016-12-26 ENCOUNTER — Ambulatory Visit (HOSPITAL_BASED_OUTPATIENT_CLINIC_OR_DEPARTMENT_OTHER): Payer: PRIVATE HEALTH INSURANCE

## 2016-12-26 ENCOUNTER — Other Ambulatory Visit (HOSPITAL_BASED_OUTPATIENT_CLINIC_OR_DEPARTMENT_OTHER): Payer: PRIVATE HEALTH INSURANCE

## 2016-12-26 ENCOUNTER — Ambulatory Visit (HOSPITAL_BASED_OUTPATIENT_CLINIC_OR_DEPARTMENT_OTHER): Payer: PRIVATE HEALTH INSURANCE | Admitting: Adult Health

## 2016-12-26 ENCOUNTER — Telehealth: Payer: Self-pay | Admitting: Adult Health

## 2016-12-26 VITALS — BP 135/79 | HR 68 | Temp 98.3°F | Resp 20 | Ht 67.0 in | Wt 201.4 lb

## 2016-12-26 DIAGNOSIS — C50212 Malignant neoplasm of upper-inner quadrant of left female breast: Secondary | ICD-10-CM

## 2016-12-26 DIAGNOSIS — Z5112 Encounter for antineoplastic immunotherapy: Secondary | ICD-10-CM | POA: Diagnosis not present

## 2016-12-26 DIAGNOSIS — Z170 Estrogen receptor positive status [ER+]: Secondary | ICD-10-CM

## 2016-12-26 LAB — CBC WITH DIFFERENTIAL/PLATELET
BASO%: 1 % (ref 0.0–2.0)
Basophils Absolute: 0 10*3/uL (ref 0.0–0.1)
EOS%: 2.6 % (ref 0.0–7.0)
Eosinophils Absolute: 0.1 10*3/uL (ref 0.0–0.5)
HCT: 37.5 % (ref 34.8–46.6)
HGB: 12.1 g/dL (ref 11.6–15.9)
LYMPH%: 39.8 % (ref 14.0–49.7)
MCH: 26.9 pg (ref 25.1–34.0)
MCHC: 32.2 g/dL (ref 31.5–36.0)
MCV: 83.7 fL (ref 79.5–101.0)
MONO#: 0.7 10*3/uL (ref 0.1–0.9)
MONO%: 16.4 % — ABNORMAL HIGH (ref 0.0–14.0)
NEUT#: 1.6 10*3/uL (ref 1.5–6.5)
NEUT%: 40.2 % (ref 38.4–76.8)
Platelets: 161 10*3/uL (ref 145–400)
RBC: 4.48 10*6/uL (ref 3.70–5.45)
RDW: 14 % (ref 11.2–14.5)
WBC: 4 10*3/uL (ref 3.9–10.3)
lymph#: 1.6 10*3/uL (ref 0.9–3.3)

## 2016-12-26 LAB — COMPREHENSIVE METABOLIC PANEL
ALT: 72 U/L — ABNORMAL HIGH (ref 0–55)
AST: 39 U/L — ABNORMAL HIGH (ref 5–34)
Albumin: 3.9 g/dL (ref 3.5–5.0)
Alkaline Phosphatase: 90 U/L (ref 40–150)
Anion Gap: 8 mEq/L (ref 3–11)
BUN: 10.5 mg/dL (ref 7.0–26.0)
CO2: 27 mEq/L (ref 22–29)
Calcium: 9.4 mg/dL (ref 8.4–10.4)
Chloride: 105 mEq/L (ref 98–109)
Creatinine: 0.9 mg/dL (ref 0.6–1.1)
EGFR: >60 mL/min/{1.73_m2} (ref >60)
Glucose: 104 mg/dl (ref 70–140)
Potassium: 4 mEq/L (ref 3.5–5.1)
Sodium: 140 mEq/L (ref 136–145)
Total Bilirubin: <0.22 mg/dL (ref 0.20–1.20)
Total Protein: 7.5 g/dL (ref 6.4–8.3)

## 2016-12-26 MED ORDER — DIPHENHYDRAMINE HCL 25 MG PO CAPS
25.0000 mg | ORAL_CAPSULE | Freq: Once | ORAL | Status: AC
  Administered 2016-12-26: 25 mg via ORAL

## 2016-12-26 MED ORDER — TRASTUZUMAB CHEMO 150 MG IV SOLR
6.0000 mg/kg | Freq: Once | INTRAVENOUS | Status: AC
  Administered 2016-12-26: 504 mg via INTRAVENOUS
  Filled 2016-12-26: qty 24

## 2016-12-26 MED ORDER — ACETAMINOPHEN 325 MG PO TABS
650.0000 mg | ORAL_TABLET | Freq: Once | ORAL | Status: AC
  Administered 2016-12-26: 650 mg via ORAL

## 2016-12-26 MED ORDER — SODIUM CHLORIDE 0.9% FLUSH
10.0000 mL | INTRAVENOUS | Status: DC | PRN
  Administered 2016-12-26: 10 mL
  Filled 2016-12-26: qty 10

## 2016-12-26 MED ORDER — DIPHENHYDRAMINE HCL 25 MG PO CAPS
ORAL_CAPSULE | ORAL | Status: AC
  Filled 2016-12-26: qty 1

## 2016-12-26 MED ORDER — HEPARIN SOD (PORK) LOCK FLUSH 100 UNIT/ML IV SOLN
500.0000 [IU] | Freq: Once | INTRAVENOUS | Status: AC | PRN
  Administered 2016-12-26: 500 [IU]
  Filled 2016-12-26: qty 5

## 2016-12-26 MED ORDER — ACETAMINOPHEN 325 MG PO TABS
ORAL_TABLET | ORAL | Status: AC
  Filled 2016-12-26: qty 2

## 2016-12-26 MED ORDER — SODIUM CHLORIDE 0.9 % IV SOLN
Freq: Once | INTRAVENOUS | Status: AC
  Administered 2016-12-26: 16:00:00 via INTRAVENOUS

## 2016-12-26 NOTE — Progress Notes (Signed)
[place]  [phone] [phone]     ID: EZELL MELIKIAN DOB: 1980-12-01  MR#: [id number]  DDU#:[id number]  Patient Care Team: [name], MD as PCP - General (Family Medicine) Stark Klein, MD as Consulting Physician (General Surgery) Magrinat, Virgie Dad, MD as Consulting Physician (Oncology) Eppie Gibson, MD as Attending Physician (Radiation Oncology) Delice Bison Charlestine Massed, NP as Nurse Practitioner (Hematology and Oncology) Scot Dock, NP OTHER MD:  CHIEF COMPLAINT:  Estrogen receptor positive breast cancer  CURRENT TREATMENT: anti-HER-2 immunotherapy, goserelin, tamoxifen  BREAST CANCER HISTORY: From the original intake note:  Jerlean noted some itching on her breasts and since this was the symptom that preceded her grandmother's breast cancer she underwent baseline bilateral screening mammography at [facility] 01/10/2016. The breast density was category B. In the left breast central to the nipple there was a 1.7 cm irregular high density mass with pleomorphic calcifications. Accordingly the patient was brought back 01/14/2016 for left diagnostic mammography and ultrasonography. This confirmed a 0.8 cm irregular mass in the left breast at the 10:00 position. 0.5 cm from the nipple lateral and posterior to that there was a 0.2 cm cluster of amorphous calcifications with an associated density. Anterior to the index mass was a 0.5 cm cluster of amorphous calcifications. All 3 findings taken together 3 cm. Ultrasound of the left breast found a 1 cm irregular mass in the left breast at the 10:00 position retroareolarly. There were no other sonographic abnormalities and the left axilla was sonographically benign  On 01/15/2016 the patient underwent core needle biopsy of the left breast index lesion at 10:00, and this found invasive ductal carcinoma, E-cadherin positive, estrogen receptor 90% positive, progesterone receptor 40% positive, both with  moderate staining intensity, with an MIB-1 of 5%, but HER-2 amplified, with a signals ratio of 2.41 and the number per cell 6.50. One of the 2 areas of abnormal calcifications, the more posterior one measuring 0.2 cm, was also biopsied and showed only atypical ductal hyperplasia (S AAA [id number] and [id number]).  Her subsequent history is as detailed below  INTERVAL HISTORY: Jourdyn returns today for follow-up and treatment of her triple positive breast cancer. She is receiving Trastuzumab and is tolerating it well.  She is also taking Tamoxifen daily without any issues.    REVIEW OF SYSTEMS: Tenelle is doing well today.  She is fatigued and wants to know when she will get back to normal.  She denies any other issues and a detailed ROS is non contributory.      PAST MEDICAL HISTORY: Past Medical History:  Diagnosis Date  . Anemia    as a child  . Anxiety   . Cholestasis   . Elevated liver enzymes    during pregnancy March 2015  . Family history of breast cancer   . Family history of prostate cancer   . GERD (gastroesophageal reflux disease)    one episode  . Headache   . History of radiation therapy 06/11/16- 07/23/16   Left Breast 50 Gy in 25 fractions, Left Breast boost 10 Gy in 5 fractions.   . Hypothyroidism   . Malignant neoplasm of upper-inner quadrant of left female breast ([place]) 01/16/2016    PAST SURGICAL HISTORY: Past Surgical History:  Procedure Laterality Date  . BREAST LUMPECTOMY WITH RADIOACTIVE SEED AND SENTINEL LYMPH NODE BIOPSY Left 03/13/2016   Procedure: LEFT BREAST LUMPECTOMY WITH RADIOACTIVE SEED X 2 AND SENTINEL LYMPH NODE BIOPSY;  Surgeon: Stark Klein, MD;  Location:  [facility];  Service: General;  Laterality: Left;  . NO PAST SURGERIES    . PORTACATH PLACEMENT Right 03/13/2016   Procedure: INSERTION PORT-A-CATH;  Surgeon: Stark Klein, MD;  Location: [facility] OR;  Service: General;  Laterality: Right;    FAMILY HISTORY Family History  Problem Relation Age of Onset  .  Hypertension Mother   . Hypertension Father   . Prostate cancer Father   . Liver cancer Maternal Grandmother   . Breast cancer Paternal Grandmother        dx in her 11s  . Lung cancer Paternal Aunt        smoker  . Heart attack Paternal Grandfather   . Liver cancer Other   The patient's parents are alive, in their mid 62s as of November 2017. The patient father was diagnosed with prostate cancer at age 44. A paternal aunt was diagnosed with lung cancer in her late 17s. Also the paternal grandmother was diagnosed with breast cancer at the age of 38. On the mother's side there are 2 cases of liver cancer, age of diagnosis unknown.  GYNECOLOGIC HISTORY:  No LMP recorded.  Menarche age 30, first live birth age 22, she is [place] P2. She still having periods, most recently 01/21/2016, but they are no longer regular. She remotely used a number ring and oral contraceptives but stopped in 2016.Currently there use barrier methods for contraception.   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 Her husband [name] works as a Printmaker for [employer] (for example he worked on our (). Their children are [name] and [name],, aged 60 and 2 as of November 2017. Attention and is not a church attender    ADVANCED DIRECTIVES: Not in place   HEALTH MAINTENANCE: Social History  Substance Use Topics  . Smoking status: Never Smoker  . Smokeless tobacco: Never Used  . Alcohol use No     Colonoscopy:  PAP:2016  Bone density:   Allergies  Allergen Reactions  . Amoxicillin Diarrhea and Nausea Only    Has patient had a PCN reaction causing immediate rash, facial/tongue/throat swelling, SOB or lightheadedness with hypotension: No Has patient had a PCN reaction causing severe rash involving mucus membranes or skin necrosis: No Has patient had a PCN reaction that required hospitalization No Has patient had a PCN reaction occurring within the  last 10 years: Yes If all of the above answers are "NO", then may proceed with Cephalosporin use.     Current Outpatient Prescriptions  Medication Sig Dispense Refill  . acetaminophen (TYLENOL) 500 MG tablet Take 500-1,000 mg by mouth every 6 (six) hours as needed (for headaches.).    Marland Kitchen fluticasone (FLONASE) 50 MCG/ACT nasal spray Place 1-2 sprays into both nostrils daily as needed for allergies or rhinitis.    Marland Kitchen goserelin (ZOLADEX) 10.8 MG injection Inject 10.8 mg into the skin every 30 (thirty) days. She is not sure of the exact dose    . ibuprofen (ADVIL,MOTRIN) 200 MG tablet Take 400 mg by mouth every 8 (eight) hours as needed (for pain.).    Marland Kitchen lidocaine-prilocaine (EMLA) cream Apply 1 application topically as needed. 30 g 0  . Multiple Vitamins-Minerals (MULTIVITAMIN GUMMIES ADULT PO) Take 2 tablets by mouth daily.    . tamoxifen (NOLVADEX) 20 MG tablet Take 1 tablet (20 mg total) by mouth daily. 30 tablet 3   No current facility-administered medications for this visit.     OBJECTIVE:  Vitals:   12/26/16 1413  BP: 135/79  Pulse: 68  Resp: 20  Temp: 98.3 F (36.8 C)  SpO2: 98%     Body mass index is 31.54 kg/m.    ECOG FS:1 - Symptomatic but completely ambulatory  GENERAL: Patient is a well appearing female in no acute distress HEENT:  Sclerae anicteric.  Oropharynx clear and moist. No ulcerations or evidence of oropharyngeal candidiasis. Neck is supple.  NODES:  No cervical, supraclavicular, or axillary lymphadenopathy palpated.  BREAST EXAM:  Deferred. LUNGS:  Clear to auscultation bilaterally.  No wheezes or rhonchi. HEART:  Regular rate and rhythm. No murmur appreciated. ABDOMEN:  Soft, nontender.  Positive, normoactive bowel sounds. No organomegaly palpated. MSK:  No focal spinal tenderness to palpation. Full range of motion bilaterally in the upper extremities. EXTREMITIES:  No peripheral edema.   SKIN:  Clear with no obvious rashes or skin changes. No nail  dyscrasia. NEURO:  Nonfocal. Well oriented.  Appropriate affect.    LAB RESULTS:  CMP     Component Value Date/Time   NA 143 12/05/2016 1511   K 4.0 12/05/2016 1511   CL 107 03/07/2016 1612   CO2 28 12/05/2016 1511   GLUCOSE 118 12/05/2016 1511   BUN 10.7 12/05/2016 1511   CREATININE 0.8 12/05/2016 1511   CALCIUM 9.7 12/05/2016 1511   PROT 7.4 12/05/2016 1511   ALBUMIN 3.8 12/05/2016 1511   AST 21 12/05/2016 1511   ALT 25 12/05/2016 1511   ALKPHOS 87 12/05/2016 1511   BILITOT <0.22 12/05/2016 1511   GFRNONAA >60 03/07/2016 1612   GFRAA >60 03/07/2016 1612    INo results found for: SPEP, UPEP  Lab Results  Component Value Date   WBC 4.0 12/26/2016   NEUTROABS 1.6 12/26/2016   HGB 12.1 12/26/2016   HCT 37.5 12/26/2016   MCV 83.7 12/26/2016   PLT 161 12/26/2016      Chemistry      Component Value Date/Time   NA 143 12/05/2016 1511   K 4.0 12/05/2016 1511   CL 107 03/07/2016 1612   CO2 28 12/05/2016 1511   BUN 10.7 12/05/2016 1511   CREATININE 0.8 12/05/2016 1511      Component Value Date/Time   CALCIUM 9.7 12/05/2016 1511   ALKPHOS 87 12/05/2016 1511   AST 21 12/05/2016 1511   ALT 25 12/05/2016 1511   BILITOT <0.22 12/05/2016 1511       No results found for: LABCA2  No components found for: LJQGB201  No results for input(s): INR in the last 168 hours.  Urinalysis    Component Value Date/Time   COLORURINE YELLOW 10/14/2012 1954   APPEARANCEUR CLEAR 10/14/2012 1954   LABSPEC >1.030 (H) 10/14/2012 1954   PHURINE 6.0 10/14/2012 1954   GLUCOSEU NEGATIVE 10/14/2012 1954   HGBUR TRACE (A) 10/14/2012 1954   BILIRUBINUR NEGATIVE 10/14/2012 1954   KETONESUR 15 (A) 10/14/2012 1954   PROTEINUR NEGATIVE 10/14/2012 1954   UROBILINOGEN 0.2 10/14/2012 1954   NITRITE NEGATIVE 10/14/2012 1954   LEUKOCYTESUR NEGATIVE 10/14/2012 1954     STUDIES: No results found.   ELIGIBLE FOR AVAILABLE RESEARCH PROTOCOL: no  ASSESSMENT: 36 y.o. [place] woman  status post left breast upper inner quadrant biopsy 01/15/2016 for a clinical T1c N0, stage IA invasive ductal carcinoma, E-cadherin positive, estrogen receptor 90% positive, progesterone receptor 40% positive, both with moderate staining intensity, with an MIB-1 of 5%, but HER-2 amplified, with a signals ratio of 2.41 and the number per cell 6.50.  (  a) One of 2 areas of abnormal calcifications, the more posterior one measuring 0.2 cm, was also biopsied and showed only atypical ductal hyperplasia  (b) a 4.1 cm area of non-masslike enhancement in the left breast underwent biopsy 02/19/2016 showing only fibrocystic changes  (1) tamoxifen started 01/23/2016 neoadjuvantly  (2) genetics testing 02/22/2016 through the Breast/Ovarian gene panel offered by [facility] found no deleterious mutations in  ATM, BARD1, BRCA1, BRCA2, BRIP1, CDH1, CHEK2, EPCAM, FANCC, MLH1, MSH2, MSH6, NBN, PALB2, PMS2, PTEN, RAD51C, RAD51D, TP53, and XRCC2.     (3) status post left lumpectomy and sentinel lymph node sampling 03/13/2016 for an mpT1c pN0, stage IA invasive ductal carcinoma, grade 2, with negative margins.  (4) paclitaxel weekly 12 and trastuzumab every 21 days beginning on 04/10/2016.  (a) paclitaxel discontinued after 5 doses because of atypical neuropathy symptoms. Last dose 04/28/2016  (5) trastuzumab to be continued to complete a year--through February 2019  (a) echocardiogram 02/11/2016 shows ejection fraction in the 60-65% range.  (b) echo 05/28/2916 shows EF in the 65-70% range  (c) echo on 09/26/2016 shows LVEF of 55-60%  (6) adjuvant radiation 06/11/16 - 07/23/16 Site/dose:    1) Left Breast / 50 Gy in 25 fractions 2) Left Breast Boost / 10 Gy in 5 fractions  (7) tamoxifen resumed to 07/25/2016  (8) patient resumed menses post chemo, April 2018  (a) goserelin started 06/17/2016, initially given every 28 days, however changed to every 12 weeks on 11/12/16 with estradiol monitoring.  PLAN: Chandel is doing  well today.  She will proceed with Trastuzumab.  She is tolerating it well.  Her next echo is scheduled on 01/14/17.  She will continue to receive Goserelin every 12 weeks.  She has a standing lab order to check her estradiol level every 80 days to ensure Goserelin is providing adequate ovarian suppression.  She and I reviewed healthy diet, exercise today.    She receives trastuzumab every 21 days. She will continue to see [place] every third trastuzumab dose.  She knows to call for any problems that may develop before the next visit here.  A total of (30) minutes of face-to-face time was spent with this patient with greater than 50% of that time in counseling and care-coordination.   Scot Dock, NP   12/26/2016 2:29 PM Medical Oncology and Hematology [facility] [address] [place], [place] [address] Tel. [phone]    Fax. [phone]

## 2016-12-26 NOTE — Patient Instructions (Signed)
[facility] Discharge Instructions for Patients Receiving Chemotherapy  Today you received the following chemotherapy agents trastuzumab (Herceptin) To help prevent nausea and vomiting after your treatment, we encourage you to take your nausea medication as prescribed.  If you develop nausea and vomiting that is not controlled by your nausea medication, call the clinic.   BELOW ARE SYMPTOMS THAT SHOULD BE REPORTED IMMEDIATELY:  *FEVER GREATER THAN 100.5 F  *CHILLS WITH OR WITHOUT FEVER  NAUSEA AND VOMITING THAT IS NOT CONTROLLED WITH YOUR NAUSEA MEDICATION  *UNUSUAL SHORTNESS OF BREATH  *UNUSUAL BRUISING OR BLEEDING  TENDERNESS IN MOUTH AND THROAT WITH OR WITHOUT PRESENCE OF ULCERS  *URINARY PROBLEMS  *BOWEL PROBLEMS  UNUSUAL RASH Items with * indicate a potential emergency and should be followed up as soon as possible.  Feel free to call the clinic you have any questions or concerns. The clinic phone number is [phone].  Please show the [facility] at check-in to the Emergency Department and triage nurse.

## 2016-12-26 NOTE — Telephone Encounter (Signed)
Scheduled appt per 11/2 los - put with [facility] because GM is off

## 2016-12-27 ENCOUNTER — Encounter: Payer: Self-pay | Admitting: Adult Health

## 2016-12-29 ENCOUNTER — Encounter: Payer: Self-pay | Admitting: Oncology

## 2017-01-01 ENCOUNTER — Other Ambulatory Visit: Payer: Self-pay | Admitting: *Deleted

## 2017-01-01 MED ORDER — TAMOXIFEN CITRATE 20 MG PO TABS
20.0000 mg | ORAL_TABLET | Freq: Every day | ORAL | 3 refills | Status: DC
Start: 2017-01-01 — End: 2017-04-24

## 2017-01-01 MED ORDER — TAMOXIFEN CITRATE 20 MG PO TABS: 20.0000 mg | ORAL_TABLET | Freq: Every day | ORAL | 3 refills | Status: DC

## 2017-01-09 ENCOUNTER — Other Ambulatory Visit (HOSPITAL_COMMUNITY): Payer: Self-pay | Admitting: Cardiology

## 2017-01-09 DIAGNOSIS — C50212 Malignant neoplasm of upper-inner quadrant of left female breast: Secondary | ICD-10-CM

## 2017-01-09 DIAGNOSIS — Z170 Estrogen receptor positive status [ER+]: Secondary | ICD-10-CM

## 2017-01-14 ENCOUNTER — Encounter (HOSPITAL_COMMUNITY): Payer: Self-pay | Admitting: Internal Medicine

## 2017-01-14 ENCOUNTER — Other Ambulatory Visit: Payer: Self-pay

## 2017-01-14 ENCOUNTER — Ambulatory Visit (HOSPITAL_COMMUNITY)
Admission: RE | Admit: 2017-01-14 | Discharge: 2017-01-14 | Disposition: A | Discharge status: Home / Self Care | Payer: PRIVATE HEALTH INSURANCE | Source: Ambulatory Visit | Attending: Internal Medicine | Admitting: Internal Medicine

## 2017-01-14 ENCOUNTER — Ambulatory Visit (HOSPITAL_BASED_OUTPATIENT_CLINIC_OR_DEPARTMENT_OTHER)
Admission: RE | Admit: 2017-01-14 | Discharge: 2017-01-14 | Disposition: A | Discharge status: Home / Self Care | Payer: PRIVATE HEALTH INSURANCE | Source: Ambulatory Visit | Attending: Internal Medicine | Admitting: Internal Medicine

## 2017-01-14 VITALS — BP 139/83 | HR 64 | Wt 197.5 lb

## 2017-01-14 DIAGNOSIS — Z880 Allergy status to penicillin: Secondary | ICD-10-CM | POA: Insufficient documentation

## 2017-01-14 DIAGNOSIS — Z170 Estrogen receptor positive status [ER+]: Secondary | ICD-10-CM

## 2017-01-14 DIAGNOSIS — Z923 Personal history of irradiation: Secondary | ICD-10-CM | POA: Insufficient documentation

## 2017-01-14 DIAGNOSIS — Z9221 Personal history of antineoplastic chemotherapy: Secondary | ICD-10-CM | POA: Insufficient documentation

## 2017-01-14 DIAGNOSIS — C50212 Malignant neoplasm of upper-inner quadrant of left female breast: Secondary | ICD-10-CM

## 2017-01-14 NOTE — Addendum Note (Signed)
Encounter addended by: Scarlette Calico, RN on: 01/14/2017 12:10 PM  Actions taken: Diagnosis association updated, Order list changed

## 2017-01-14 NOTE — Patient Instructions (Signed)
Your physician recommends that you schedule a follow-up appointment in: 3 months with echocardiogram  

## 2017-01-14 NOTE — Progress Notes (Signed)
  Echocardiogram 2D Echocardiogram has been performed.  Tresa Res 01/14/2017, 11:34 AM

## 2017-01-14 NOTE — Progress Notes (Signed)
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 Jana Hakim for enrollment into the Cardio-Oncology program.   On 01/15/2016 the patient underwent core needle biopsy of the left breast index lesion at 10:00, and this found invasive ductal carcinoma, E-cadherin positive, estrogen receptor 90% positive, progesterone receptor 40% positive, both with moderate staining intensity, with an MIB-1 of 5%, but HER-2 amplified, with a signals ratio of 2.41 and the number per cell 6.50. One of the 2 areas of abnormal calcifications, the more posterior one measuring 0.2 cm, was also biopsied and showed only atypical ductal hyperplasia (S AAA [id number] and [id number]).  Cancer history as follow:   (1) tamoxifen started 01/23/2016 neoadjuvantly (2) status post left lumpectomy and sentinel lymph node sampling 03/13/2016 for an mpT1c pN0, stage IA invasive ductal carcinoma, grade 2, with negative margins. (3) paclitaxel weekly 12 and trastuzumab every 21 days beginning on 04/10/2016.  (a) paclitaxel discontinued after 5 doses because of atypical neuropathy symptoms. Last dose 04/28/2016 (4) trastuzumab to be continued to complete a year--through February 2019 (5) adjuvant radiation completed in summer 2018 (6) anti-estrogens to resume at the completion of local treatment, to be continued for 5-10 years  Started initial chemo in February 2018. 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 Remains on  herceptin now. Tolerating well. Denies any HF symptoms. No orthopnea, PND or DOE. Mild edema after Herceptin. Goes to gym every day.   Echo 01/14/17: EF 60-65%.  Lateral s' 10.1 cm/s. GLS -16.8% (underestimated due to poor endocardial tracking) Echo 05/28/16: EF 65-70%.  Lateral s' 11.2 cm/s. GLS -22.4%  PAST MEDICAL HISTORY: Past Medical History:  Diagnosis Date  . Anemia    as a child  . Anxiety   . Cholestasis   . Elevated liver enzymes    during pregnancy March 2015  . Family history of breast cancer   . Family history of prostate cancer   . GERD (gastroesophageal reflux disease)    one episode  . Headache   . History of radiation therapy 06/11/16- 07/23/16   Left Breast 50 Gy in 25 fractions, Left Breast boost 10 Gy in 5 fractions.   . Hypothyroidism   . Malignant neoplasm of upper-inner quadrant of left female breast ([place]) 01/16/2016    PAST SURGICAL HISTORY: Past Surgical History:  Procedure Laterality Date  . BREAST LUMPECTOMY WITH RADIOACTIVE SEED AND SENTINEL LYMPH NODE BIOPSY Left 03/13/2016   Procedure: LEFT BREAST LUMPECTOMY WITH RADIOACTIVE SEED X 2 AND SENTINEL LYMPH NODE BIOPSY;  Surgeon: Stark Klein, MD;  Location: [facility];  Service: General;  Laterality: Left;  . NO PAST SURGERIES    . PORTACATH PLACEMENT Right 03/13/2016   Procedure: INSERTION PORT-A-CATH;  Surgeon: Stark Klein, MD;  Location: [facility];  Service: General;  Laterality: Right;    FAMILY HISTORY Family History  Problem Relation Age of Onset  . Hypertension Mother   . Hypertension Father   . Prostate cancer Father   . Liver cancer Maternal Grandmother   . Breast cancer Paternal Grandmother  dx in her 69s  . Lung cancer Paternal Aunt        smoker  . Heart attack Paternal Grandfather   . Liver cancer Other   The patient's parents are alive, in their mid 29s as of November 2017. The patient father was diagnosed with prostate cancer at age 67. A paternal aunt was diagnosed with lung cancer in  her late 60s. Also the paternal grandmother was diagnosed with breast cancer at the age of 56. On the mother's side there are 2 cases of liver cancer, age of diagnosis unknown.  GYNECOLOGIC HISTORY:  No LMP recorded.  Menarche age 39, first live birth age 75, she is Virginia [name]. She still having periods, most recently 01/21/2016, but they are no longer regular. She remotely used a number ring and oral contraceptives but stopped in 2016.Currently there use barrier methods for contraception.   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 Her husband [name] works as a Printmaker for a Forensic [employer]. Their children are [name] and [name], aged 21 and 2 as of November 2017.     HEALTH MAINTENANCE: Social History   Tobacco Use  . Smoking status: Never Smoker  . Smokeless tobacco: Never Used  Substance Use Topics  . Alcohol use: No  . Drug use: No     Colonoscopy:  PAP:2016  Bone density:   Allergies  Allergen Reactions  . Amoxicillin Diarrhea and Nausea Only    Has patient had a PCN reaction causing immediate rash, facial/tongue/throat swelling, SOB or lightheadedness with hypotension: No Has patient had a PCN reaction causing severe rash involving mucus membranes or skin necrosis: No Has patient had a PCN reaction that required hospitalization No Has patient had a PCN reaction occurring within the last 10 years: Yes If all of the above answers are "NO", then may proceed with Cephalosporin use.     Current Outpatient Medications  Medication Sig Dispense Refill  . acetaminophen (TYLENOL) 500 MG tablet Take 500-1,000 mg by mouth every 6 (six) hours as needed (for headaches.).    Marland Kitchen fluticasone (FLONASE) 50 MCG/ACT nasal spray Place 1-2 sprays into both nostrils daily as needed for allergies or rhinitis.    Marland Kitchen goserelin (ZOLADEX) 10.8 MG injection Inject 10.8 mg into the skin every 30 (thirty) days. She is not sure of the exact dose     . ibuprofen (ADVIL,MOTRIN) 200 MG tablet Take 400 mg by mouth every 8 (eight) hours as needed (for pain.).    Marland Kitchen lidocaine-prilocaine (EMLA) cream Apply 1 application topically as needed. 30 g 0  . Multiple Vitamins-Minerals (MULTIVITAMIN GUMMIES ADULT PO) Take 2 tablets by mouth daily.    . tamoxifen (NOLVADEX) 20 MG tablet Take 1 tablet (20 mg total) daily by mouth. 30 tablet 3   No current facility-administered medications for this encounter.     There were no vitals filed for this visit.   There is no height or weight on file to calculate BMI.    ECOG FS:1 - Symptomatic but completely ambulatory   Exam: General:  Well appearing. No resp difficulty HEENT: normal Neck: supple. no JVD. Carotids 2+ bilat; no bruits. No lymphadenopathy or thryomegaly appreciated. Cor: PMI nondisplaced. Regular rate & rhythm. No rubs, gallops or murmurs. Lungs: clear Abdomen: soft, nontender, nondistended. No hepatosplenomegaly. No bruits or masses. Good bowel sounds. Extremities: no cyanosis, clubbing, rash, edema Neuro: alert & orientedx3, cranial nerves grossly intact. moves all 4 extremities w/o  difficulty. Affect pleasant   LAB RESULTS:  CMP     Component Value Date/Time   NA 140 12/26/2016 1402   K 4.0 12/26/2016 1402   CL 107 03/07/2016 1612   CO2 27 12/26/2016 1402   GLUCOSE 104 12/26/2016 1402   BUN 10.5 12/26/2016 1402   CREATININE 0.9 12/26/2016 1402   CALCIUM 9.4 12/26/2016 1402   PROT 7.5 12/26/2016 1402   ALBUMIN 3.9 12/26/2016 1402   AST 39 (H) 12/26/2016 1402   ALT 72 (H) 12/26/2016 1402   ALKPHOS 90 12/26/2016 1402   BILITOT <0.22 12/26/2016 1402   GFRNONAA >60 03/07/2016 1612   GFRAA >60 03/07/2016 1612    INo results found for: SPEP, UPEP  Lab Results  Component Value Date   WBC 4.0 12/26/2016   NEUTROABS 1.6 12/26/2016   HGB 12.1 12/26/2016   HCT 37.5 12/26/2016   MCV 83.7 12/26/2016   PLT 161 12/26/2016      Chemistry      Component Value Date/Time   NA  140 12/26/2016 1402   K 4.0 12/26/2016 1402   CL 107 03/07/2016 1612   CO2 27 12/26/2016 1402   BUN 10.5 12/26/2016 1402   CREATININE 0.9 12/26/2016 1402      Component Value Date/Time   CALCIUM 9.4 12/26/2016 1402   ALKPHOS 90 12/26/2016 1402   AST 39 (H) 12/26/2016 1402   ALT 72 (H) 12/26/2016 1402   BILITOT <0.22 12/26/2016 1402       No results found for: LABCA2  No components found for: LABCA125  No results for input(s): INR in the last 168 hours.  Urinalysis    Component Value Date/Time   COLORURINE YELLOW 10/14/2012 1954   APPEARANCEUR CLEAR 10/14/2012 1954   LABSPEC >1.030 (H) 10/14/2012 1954   PHURINE 6.0 10/14/2012 1954   GLUCOSEU NEGATIVE 10/14/2012 1954   HGBUR TRACE (A) 10/14/2012 1954   BILIRUBINUR NEGATIVE 10/14/2012 1954   KETONESUR 15 (A) 10/14/2012 1954   PROTEINUR NEGATIVE 10/14/2012 1954   UROBILINOGEN 0.2 10/14/2012 1954   NITRITE NEGATIVE 10/14/2012 1954   LEUKOCYTESUR NEGATIVE 10/14/2012 1954    Assessment/Plan:  1. Left breast CA - triple positive. s/p lumpectomy and XRT. - Herceptin started 2/18  - I reviewed echos personally. EF a stabe but GLS and LS' reduced due to poor technical study. No evidence of cardiotoxicity. Continue Herceptin.    Glori Bickers, MD  10:52 AM  .

## 2017-01-16 ENCOUNTER — Other Ambulatory Visit (HOSPITAL_BASED_OUTPATIENT_CLINIC_OR_DEPARTMENT_OTHER): Payer: PRIVATE HEALTH INSURANCE

## 2017-01-16 ENCOUNTER — Ambulatory Visit (HOSPITAL_BASED_OUTPATIENT_CLINIC_OR_DEPARTMENT_OTHER): Payer: PRIVATE HEALTH INSURANCE

## 2017-01-16 VITALS — BP 131/89 | HR 60 | Temp 97.8°F | Resp 16

## 2017-01-16 DIAGNOSIS — C50212 Malignant neoplasm of upper-inner quadrant of left female breast: Secondary | ICD-10-CM

## 2017-01-16 DIAGNOSIS — Z170 Estrogen receptor positive status [ER+]: Secondary | ICD-10-CM

## 2017-01-16 DIAGNOSIS — Z5112 Encounter for antineoplastic immunotherapy: Secondary | ICD-10-CM

## 2017-01-16 LAB — COMPREHENSIVE METABOLIC PANEL
ALT: 79 U/L — ABNORMAL HIGH (ref 0–55)
AST: 41 U/L — ABNORMAL HIGH (ref 5–34)
Albumin: 3.7 g/dL (ref 3.5–5.0)
Alkaline Phosphatase: 90 U/L (ref 40–150)
Anion Gap: 9 mEq/L (ref 3–11)
BUN: 11.7 mg/dL (ref 7.0–26.0)
CO2: 27 mEq/L (ref 22–29)
Calcium: 9.3 mg/dL (ref 8.4–10.4)
Chloride: 106 mEq/L (ref 98–109)
Creatinine: 0.9 mg/dL (ref 0.6–1.1)
EGFR: >60 mL/min/{1.73_m2} (ref >60)
Glucose: 99 mg/dl (ref 70–140)
Potassium: 4.4 mEq/L (ref 3.5–5.1)
Sodium: 142 mEq/L (ref 136–145)
Total Bilirubin: <0.22 mg/dL (ref 0.20–1.20)
Total Protein: 7.1 g/dL (ref 6.4–8.3)

## 2017-01-16 LAB — CBC WITH DIFFERENTIAL/PLATELET
BASO%: 0.8 % (ref 0.0–2.0)
Basophils Absolute: 0 10*3/uL (ref 0.0–0.1)
EOS%: 3.6 % (ref 0.0–7.0)
Eosinophils Absolute: 0.1 10*3/uL (ref 0.0–0.5)
HCT: 37.8 % (ref 34.8–46.6)
HGB: 11.9 g/dL (ref 11.6–15.9)
LYMPH%: 43.8 % (ref 14.0–49.7)
MCH: 27.2 pg (ref 25.1–34.0)
MCHC: 31.5 g/dL (ref 31.5–36.0)
MCV: 86.3 fL (ref 79.5–101.0)
MONO#: 0.6 10*3/uL (ref 0.1–0.9)
MONO%: 16.3 % — ABNORMAL HIGH (ref 0.0–14.0)
NEUT#: 1.4 10*3/uL — ABNORMAL LOW (ref 1.5–6.5)
NEUT%: 35.5 % — ABNORMAL LOW (ref 38.4–76.8)
Platelets: 161 10*3/uL (ref 145–400)
RBC: 4.38 10*6/uL (ref 3.70–5.45)
RDW: 13.4 % (ref 11.2–14.5)
WBC: 3.9 10*3/uL (ref 3.9–10.3)
lymph#: 1.7 10*3/uL (ref 0.9–3.3)
nRBC: 0 % (ref 0–0)

## 2017-01-16 MED ORDER — ACETAMINOPHEN 325 MG PO TABS
ORAL_TABLET | ORAL | Status: AC
  Filled 2017-01-16: qty 2

## 2017-01-16 MED ORDER — HEPARIN SOD (PORK) LOCK FLUSH 100 UNIT/ML IV SOLN
500.0000 [IU] | Freq: Once | INTRAVENOUS | Status: AC | PRN
  Administered 2017-01-16: 500 [IU]
  Filled 2017-01-16: qty 5

## 2017-01-16 MED ORDER — SODIUM CHLORIDE 0.9 % IV SOLN
Freq: Once | INTRAVENOUS | Status: AC
  Administered 2017-01-16: 15:00:00 via INTRAVENOUS

## 2017-01-16 MED ORDER — TRASTUZUMAB CHEMO 150 MG IV SOLR
6.0000 mg/kg | Freq: Once | INTRAVENOUS | Status: AC
  Administered 2017-01-16: 504 mg via INTRAVENOUS
  Filled 2017-01-16: qty 24

## 2017-01-16 MED ORDER — ACETAMINOPHEN 325 MG PO TABS
650.0000 mg | ORAL_TABLET | Freq: Once | ORAL | Status: AC
  Administered 2017-01-16: 650 mg via ORAL

## 2017-01-16 MED ORDER — SODIUM CHLORIDE 0.9% FLUSH
10.0000 mL | INTRAVENOUS | Status: DC | PRN
  Administered 2017-01-16: 10 mL
  Filled 2017-01-16: qty 10

## 2017-01-16 MED ORDER — DIPHENHYDRAMINE HCL 25 MG PO CAPS
25.0000 mg | ORAL_CAPSULE | Freq: Once | ORAL | Status: AC
  Administered 2017-01-16: 25 mg via ORAL

## 2017-01-16 MED ORDER — DIPHENHYDRAMINE HCL 25 MG PO CAPS
ORAL_CAPSULE | ORAL | Status: AC
  Filled 2017-01-16: qty 1

## 2017-01-16 NOTE — Patient Instructions (Signed)
[facility] Discharge Instructions for Patients Receiving Chemotherapy  Today you received the following chemotherapy agents Herceptin  To help prevent nausea and vomiting after your treatment, we encourage you to take your nausea medication as directed   If you develop nausea and vomiting that is not controlled by your nausea medication, call the clinic.   BELOW ARE SYMPTOMS THAT SHOULD BE REPORTED IMMEDIATELY:  *FEVER GREATER THAN 100.5 F  *CHILLS WITH OR WITHOUT FEVER  NAUSEA AND VOMITING THAT IS NOT CONTROLLED WITH YOUR NAUSEA MEDICATION  *UNUSUAL SHORTNESS OF BREATH  *UNUSUAL BRUISING OR BLEEDING  TENDERNESS IN MOUTH AND THROAT WITH OR WITHOUT PRESENCE OF ULCERS  *URINARY PROBLEMS  *BOWEL PROBLEMS  UNUSUAL RASH Items with * indicate a potential emergency and should be followed up as soon as possible.  Feel free to call the clinic should you have any questions or concerns. The clinic phone number is [phone].  Please show the CHEMO ALERT CARD at check-in to the Emergency Department and triage nurse.   

## 2017-01-20 ENCOUNTER — Telehealth: Payer: Self-pay | Admitting: Oncology

## 2017-01-20 NOTE — Telephone Encounter (Signed)
01/20/2017 called [phone] @ 4:44pm and spoke with patient letting her know her FMLA paperwork was ready for pick up and would be ready for her at the front desk reception area.

## 2017-01-21 ENCOUNTER — Telehealth: Payer: Self-pay | Admitting: Oncology

## 2017-01-21 NOTE — Telephone Encounter (Signed)
01/21/2017 @ 4:44 pm called and spoke with patient @ [phone] to inform her that her FMLA/Disability paperwork was ready for pick up and would be left with front desk receptionist, Ms. Wilma.

## 2017-02-06 ENCOUNTER — Other Ambulatory Visit (HOSPITAL_BASED_OUTPATIENT_CLINIC_OR_DEPARTMENT_OTHER): Payer: PRIVATE HEALTH INSURANCE

## 2017-02-06 ENCOUNTER — Ambulatory Visit: Payer: PRIVATE HEALTH INSURANCE

## 2017-02-06 ENCOUNTER — Ambulatory Visit (HOSPITAL_BASED_OUTPATIENT_CLINIC_OR_DEPARTMENT_OTHER): Payer: PRIVATE HEALTH INSURANCE

## 2017-02-06 VITALS — BP 148/79 | HR 62 | Temp 98.5°F | Resp 18

## 2017-02-06 DIAGNOSIS — Z5112 Encounter for antineoplastic immunotherapy: Secondary | ICD-10-CM | POA: Diagnosis not present

## 2017-02-06 DIAGNOSIS — Z170 Estrogen receptor positive status [ER+]: Secondary | ICD-10-CM

## 2017-02-06 DIAGNOSIS — Z5111 Encounter for antineoplastic chemotherapy: Secondary | ICD-10-CM

## 2017-02-06 DIAGNOSIS — C50212 Malignant neoplasm of upper-inner quadrant of left female breast: Secondary | ICD-10-CM

## 2017-02-06 LAB — COMPREHENSIVE METABOLIC PANEL
ALT: 24 U/L (ref 0–55)
AST: 20 U/L (ref 5–34)
Albumin: 3.9 g/dL (ref 3.5–5.0)
Alkaline Phosphatase: 92 U/L (ref 40–150)
Anion Gap: 10 mEq/L (ref 3–11)
BUN: 11.8 mg/dL (ref 7.0–26.0)
CO2: 25 mEq/L (ref 22–29)
Calcium: 9.3 mg/dL (ref 8.4–10.4)
Chloride: 105 mEq/L (ref 98–109)
Creatinine: 0.8 mg/dL (ref 0.6–1.1)
EGFR: >60 mL/min/{1.73_m2} (ref >60)
Glucose: 93 mg/dl (ref 70–140)
Potassium: 3.8 mEq/L (ref 3.5–5.1)
Sodium: 140 mEq/L (ref 136–145)
Total Bilirubin: <0.22 mg/dL (ref 0.20–1.20)
Total Protein: 7.4 g/dL (ref 6.4–8.3)

## 2017-02-06 LAB — CBC WITH DIFFERENTIAL/PLATELET
BASO%: 0.5 % (ref 0.0–2.0)
Basophils Absolute: 0 10*3/uL (ref 0.0–0.1)
EOS%: 2.3 % (ref 0.0–7.0)
Eosinophils Absolute: 0.1 10*3/uL (ref 0.0–0.5)
HCT: 38.3 % (ref 34.8–46.6)
HGB: 12.2 g/dL (ref 11.6–15.9)
LYMPH%: 36.8 % (ref 14.0–49.7)
MCH: 27.3 pg (ref 25.1–34.0)
MCHC: 31.9 g/dL (ref 31.5–36.0)
MCV: 85.7 fL (ref 79.5–101.0)
MONO#: 0.6 10*3/uL (ref 0.1–0.9)
MONO%: 13.3 % (ref 0.0–14.0)
NEUT#: 2 10*3/uL (ref 1.5–6.5)
NEUT%: 47.1 % (ref 38.4–76.8)
Platelets: 157 10*3/uL (ref 145–400)
RBC: 4.47 10*6/uL (ref 3.70–5.45)
RDW: 13.3 % (ref 11.2–14.5)
WBC: 4.3 10*3/uL (ref 3.9–10.3)
lymph#: 1.6 10*3/uL (ref 0.9–3.3)

## 2017-02-06 MED ORDER — SODIUM CHLORIDE 0.9 % IV SOLN
Freq: Once | INTRAVENOUS | Status: AC
  Administered 2017-02-06: 15:00:00 via INTRAVENOUS

## 2017-02-06 MED ORDER — DIPHENHYDRAMINE HCL 25 MG PO CAPS
ORAL_CAPSULE | ORAL | Status: AC
  Filled 2017-02-06: qty 1

## 2017-02-06 MED ORDER — GOSERELIN ACETATE 3.6 MG ~~LOC~~ IMPL
10.8000 mg | DRUG_IMPLANT | Freq: Once | SUBCUTANEOUS | Status: AC
  Administered 2017-02-06: 10.8 mg via SUBCUTANEOUS
  Filled 2017-02-06: qty 10.8

## 2017-02-06 MED ORDER — HEPARIN SOD (PORK) LOCK FLUSH 100 UNIT/ML IV SOLN
500.0000 [IU] | Freq: Once | INTRAVENOUS | Status: AC | PRN
  Administered 2017-02-06: 500 [IU]
  Filled 2017-02-06: qty 5

## 2017-02-06 MED ORDER — DIPHENHYDRAMINE HCL 25 MG PO CAPS
25.0000 mg | ORAL_CAPSULE | Freq: Once | ORAL | Status: AC
Start: 2017-02-06 — End: 2017-02-06
  Administered 2017-02-06: 25 mg via ORAL

## 2017-02-06 MED ORDER — SODIUM CHLORIDE 0.9% FLUSH
10.0000 mL | INTRAVENOUS | Status: DC | PRN
  Administered 2017-02-06: 10 mL
  Filled 2017-02-06: qty 10

## 2017-02-06 MED ORDER — ACETAMINOPHEN 325 MG PO TABS
ORAL_TABLET | ORAL | Status: AC
  Filled 2017-02-06: qty 2

## 2017-02-06 MED ORDER — TRASTUZUMAB CHEMO 150 MG IV SOLR
6.0000 mg/kg | Freq: Once | INTRAVENOUS | Status: AC
  Administered 2017-02-06: 504 mg via INTRAVENOUS
  Filled 2017-02-06: qty 24

## 2017-02-06 MED ORDER — ACETAMINOPHEN 325 MG PO TABS
650.0000 mg | ORAL_TABLET | Freq: Once | ORAL | Status: AC
  Administered 2017-02-06: 650 mg via ORAL

## 2017-02-06 NOTE — Patient Instructions (Addendum)
[facility] Discharge Instructions for Patients Receiving Chemotherapy  Today you received the following chemotherapy agents:  Herceptin.  To help prevent nausea and vomiting after your treatment, we encourage you to take your nausea medication as directed.   If you develop nausea and vomiting that is not controlled by your nausea medication, call the clinic.   BELOW ARE SYMPTOMS THAT SHOULD BE REPORTED IMMEDIATELY:  *FEVER GREATER THAN 100.5 F  *CHILLS WITH OR WITHOUT FEVER  NAUSEA AND VOMITING THAT IS NOT CONTROLLED WITH YOUR NAUSEA MEDICATION  *UNUSUAL SHORTNESS OF BREATH  *UNUSUAL BRUISING OR BLEEDING  TENDERNESS IN MOUTH AND THROAT WITH OR WITHOUT PRESENCE OF ULCERS  *URINARY PROBLEMS  *BOWEL PROBLEMS  UNUSUAL RASH Items with * indicate a potential emergency and should be followed up as soon as possible.  Feel free to call the clinic should you have any questions or concerns. The clinic phone number is [phone].  Please show the [place] at check-in to the Emergency Department and triage nurse.  Goserelin injection What is this medicine? GOSERELIN (GOE se rel in) is similar to a hormone found in the body. It lowers the amount of sex hormones that the body makes. Men will have lower testosterone levels and women will have lower estrogen levels while taking this medicine. In men, this medicine is used to treat prostate cancer; the injection is either given once per month or once every 12 weeks. A once per month injection (only) is used to treat women with endometriosis, dysfunctional uterine bleeding, or advanced breast cancer. This medicine may be used for other purposes; ask your health care provider or pharmacist if you have questions. COMMON BRAND NAME(S): Zoladex What should I tell my health care provider before I take this medicine? They need to know if you have any of these conditions (some only apply to women): -diabetes -heart disease  or previous heart attack -high blood pressure -high cholesterol -kidney disease -osteoporosis or low bone density -problems passing urine -spinal cord injury -stroke -tobacco smoker -an unusual or allergic reaction to goserelin, hormone therapy, other medicines, foods, dyes, or preservatives -pregnant or trying to get pregnant -breast-feeding How should I use this medicine? This medicine is for injection under the skin. It is given by a health care professional in a hospital or clinic setting. Men receive this injection once every 4 weeks or once every 12 weeks. Women will only receive the once every 4 weeks injection. Talk to your pediatrician regarding the use of this medicine in children. Special care may be needed. Overdosage: If you think you have taken too much of this medicine contact a poison control center or emergency room at once. NOTE: This medicine is only for you. Do not share this medicine with others. What if I miss a dose? It is important not to miss your dose. Call your doctor or health care professional if you are unable to keep an appointment. What may interact with this medicine? -female hormones like estrogen -herbal or dietary supplements like black cohosh, chasteberry, or DHEA -female hormones like testosterone -prasterone This list may not describe all possible interactions. Give your health care provider a list of all the medicines, herbs, non-prescription drugs, or dietary supplements you use. Also tell them if you smoke, drink alcohol, or use illegal drugs. Some items may interact with your medicine. What should I watch for while using this medicine? Visit your doctor or health care professional for regular checks on your progress. Your symptoms  may appear to get worse during the first weeks of this therapy. Tell your doctor or healthcare professional if your symptoms do not start to get better or if they get worse after this time. Your bones may get weaker if you  take this medicine for a long time. If you smoke or frequently drink alcohol you may increase your risk of bone loss. A family history of osteoporosis, chronic use of drugs for seizures (convulsions), or corticosteroids can also increase your risk of bone loss. Talk to your doctor about how to keep your bones strong. This medicine should stop regular monthly menstration in women. Tell your doctor if you continue to [facility]. Women should not become pregnant while taking this medicine or for 12 weeks after stopping this medicine. Women should inform their doctor if they wish to become pregnant or think they might be pregnant. There is a potential for serious side effects to an unborn child. Talk to your health care professional or pharmacist for more information. Do not breast-feed an infant while taking this medicine. Men should inform their doctors if they wish to father a child. This medicine may lower sperm counts. Talk to your health care professional or pharmacist for more information. What side effects may I notice from receiving this medicine? Side effects that you should report to your doctor or health care professional as soon as possible: -allergic reactions like skin rash, itching or hives, swelling of the face, lips, or tongue -bone pain -breathing problems -changes in vision -chest pain -feeling faint or lightheaded, falls -fever, chills -pain, swelling, warmth in the leg -pain, tingling, numbness in the hands or feet -signs and symptoms of low blood pressure like dizziness; feeling faint or lightheaded, falls; unusually weak or tired -stomach pain -swelling of the ankles, feet, hands -trouble passing urine or change in the amount of urine -unusually high or low blood pressure -unusually weak or tired Side effects that usually do not require medical attention (report to your doctor or health care professional if they continue or are bothersome): -change in sex drive or  performance -changes in breast size in both males and females -changes in emotions or moods -headache -hot flashes -irritation at site where injected -loss of appetite -skin problems like acne, dry skin -vaginal dryness This list may not describe all possible side effects. Call your doctor for medical advice about side effects. You may report side effects to FDA at 1-800-FDA-1088. Where should I keep my medicine? This drug is given in a hospital or clinic and will not be stored at home. NOTE: This sheet is a summary. It may not cover all possible information. If you have questions about this medicine, talk to your doctor, pharmacist, or health care provider.  2018 Elsevier/Gold Standard (2013-04-19 11:10:35)

## 2017-02-10 LAB — ESTRADIOL, ULTRA SENS: Estradiol, Sensitive: 17.2 pg/mL

## 2017-02-11 ENCOUNTER — Ambulatory Visit: Payer: PRIVATE HEALTH INSURANCE

## 2017-02-12 ENCOUNTER — Telehealth: Payer: Self-pay | Admitting: *Deleted

## 2017-02-12 ENCOUNTER — Telehealth: Payer: Self-pay | Admitting: Oncology

## 2017-02-12 NOTE — Telephone Encounter (Signed)
02/12/17 called patient @ [phone] and informed her that her forms ([employer] form for medical coverage) has been filled out and ready for pick-up and will be at front desk reception waiting for her.

## 2017-02-12 NOTE — Telephone Encounter (Addendum)
This RN received VM from pt stating she has developed "bloody stools" with abd cramping. She states her family has had an episode of " a stomach bug - but need to know what to do - should I call you or my primary MD or go to the ER "  This RN returned call to pt - discussed above - with pt stating blood in mixed in stool with mucus - she is not having free flowing bloody stool " it is not turning the water in the toilet red ".  " My stomach kind of feels like when I want to have a period "  This RN informed pt above is not a known side effect of her current regimen of zoladex, herceptin and tamoxifen with request for pt to contact her primary MD - Dr Jacelyn Grip. Bloody stools could be more life threatening and may need additional work up.  Phillip verbalized understanding and will contact his office now,

## 2017-02-27 ENCOUNTER — Telehealth: Payer: Self-pay | Admitting: Oncology

## 2017-02-27 ENCOUNTER — Ambulatory Visit: Payer: PRIVATE HEALTH INSURANCE

## 2017-02-27 ENCOUNTER — Other Ambulatory Visit (HOSPITAL_BASED_OUTPATIENT_CLINIC_OR_DEPARTMENT_OTHER): Payer: PRIVATE HEALTH INSURANCE

## 2017-02-27 ENCOUNTER — Inpatient Hospital Stay: Payer: PRIVATE HEALTH INSURANCE | Attending: Adult Health | Admitting: Adult Health

## 2017-02-27 ENCOUNTER — Ambulatory Visit (HOSPITAL_BASED_OUTPATIENT_CLINIC_OR_DEPARTMENT_OTHER): Payer: PRIVATE HEALTH INSURANCE

## 2017-02-27 ENCOUNTER — Encounter: Payer: Self-pay | Admitting: Adult Health

## 2017-02-27 VITALS — BP 136/94 | HR 73 | Temp 98.6°F | Resp 18 | Ht 67.0 in | Wt 200.9 lb

## 2017-02-27 DIAGNOSIS — C50212 Malignant neoplasm of upper-inner quadrant of left female breast: Secondary | ICD-10-CM | POA: Insufficient documentation

## 2017-02-27 DIAGNOSIS — Z79810 Long term (current) use of selective estrogen receptor modulators (SERMs): Secondary | ICD-10-CM | POA: Diagnosis not present

## 2017-02-27 DIAGNOSIS — Z170 Estrogen receptor positive status [ER+]: Secondary | ICD-10-CM

## 2017-02-27 DIAGNOSIS — Z5112 Encounter for antineoplastic immunotherapy: Secondary | ICD-10-CM | POA: Insufficient documentation

## 2017-02-27 DIAGNOSIS — Z95828 Presence of other vascular implants and grafts: Secondary | ICD-10-CM | POA: Insufficient documentation

## 2017-02-27 DIAGNOSIS — I890 Lymphedema, not elsewhere classified: Secondary | ICD-10-CM

## 2017-02-27 LAB — COMPREHENSIVE METABOLIC PANEL
ALT: 25 U/L (ref 0–55)
AST: 21 U/L (ref 5–34)
Albumin: 3.8 g/dL (ref 3.5–5.0)
Alkaline Phosphatase: 82 U/L (ref 40–150)
Anion Gap: 8 mEq/L (ref 3–11)
BUN: 8.9 mg/dL (ref 7.0–26.0)
CO2: 28 mEq/L (ref 22–29)
Calcium: 9.5 mg/dL (ref 8.4–10.4)
Chloride: 106 mEq/L (ref 98–109)
Creatinine: 0.8 mg/dL (ref 0.6–1.1)
EGFR: >60 mL/min/{1.73_m2} (ref >60)
Glucose: 124 mg/dl (ref 70–140)
Potassium: 3.7 mEq/L (ref 3.5–5.1)
Sodium: 142 mEq/L (ref 136–145)
Total Bilirubin: 0.22 mg/dL (ref 0.20–1.20)
Total Protein: 7.3 g/dL (ref 6.4–8.3)

## 2017-02-27 LAB — CBC WITH DIFFERENTIAL/PLATELET
BASO%: 0.4 % (ref 0.0–2.0)
Basophils Absolute: 0 10*3/uL (ref 0.0–0.1)
EOS%: 1.9 % (ref 0.0–7.0)
Eosinophils Absolute: 0.1 10*3/uL (ref 0.0–0.5)
HCT: 38.9 % (ref 34.8–46.6)
HGB: 12.4 g/dL (ref 11.6–15.9)
LYMPH%: 29.3 % (ref 14.0–49.7)
MCH: 27.1 pg (ref 25.1–34.0)
MCHC: 31.9 g/dL (ref 31.5–36.0)
MCV: 85.1 fL (ref 79.5–101.0)
MONO#: 0.5 10*3/uL (ref 0.1–0.9)
MONO%: 9.6 % (ref 0.0–14.0)
NEUT#: 2.8 10*3/uL (ref 1.5–6.5)
NEUT%: 58.8 % (ref 38.4–76.8)
Platelets: 156 10*3/uL (ref 145–400)
RBC: 4.57 10*6/uL (ref 3.70–5.45)
RDW: 13.2 % (ref 11.2–14.5)
WBC: 4.8 10*3/uL (ref 3.9–10.3)
lymph#: 1.4 10*3/uL (ref 0.9–3.3)

## 2017-02-27 MED ORDER — SODIUM CHLORIDE 0.9% FLUSH
10.0000 mL | Freq: Once | INTRAVENOUS | Status: AC
  Administered 2017-02-27: 10 mL
  Filled 2017-02-27: qty 10

## 2017-02-27 MED ORDER — ACETAMINOPHEN 325 MG PO TABS
650.0000 mg | ORAL_TABLET | Freq: Once | ORAL | Status: AC
  Administered 2017-02-27: 650 mg via ORAL

## 2017-02-27 MED ORDER — ACETAMINOPHEN 325 MG PO TABS
ORAL_TABLET | ORAL | Status: AC
  Filled 2017-02-27: qty 2

## 2017-02-27 MED ORDER — SODIUM CHLORIDE 0.9 % IV SOLN
Freq: Once | INTRAVENOUS | Status: AC
  Administered 2017-02-27: 15:00:00 via INTRAVENOUS

## 2017-02-27 MED ORDER — DIPHENHYDRAMINE HCL 25 MG PO CAPS
25.0000 mg | ORAL_CAPSULE | Freq: Once | ORAL | Status: AC
  Administered 2017-02-27: 25 mg via ORAL

## 2017-02-27 MED ORDER — HEPARIN SOD (PORK) LOCK FLUSH 100 UNIT/ML IV SOLN
500.0000 [IU] | Freq: Once | INTRAVENOUS | Status: AC | PRN
  Administered 2017-02-27: 500 [IU]
  Filled 2017-02-27: qty 5

## 2017-02-27 MED ORDER — SODIUM CHLORIDE 0.9% FLUSH
10.0000 mL | INTRAVENOUS | Status: DC | PRN
  Administered 2017-02-27: 10 mL
  Filled 2017-02-27: qty 10

## 2017-02-27 MED ORDER — TRASTUZUMAB CHEMO 150 MG IV SOLR
6.0000 mg/kg | Freq: Once | INTRAVENOUS | Status: AC
  Administered 2017-02-27: 504 mg via INTRAVENOUS
  Filled 2017-02-27: qty 24

## 2017-02-27 MED ORDER — DIPHENHYDRAMINE HCL 25 MG PO CAPS
ORAL_CAPSULE | ORAL | Status: AC
  Filled 2017-02-27: qty 1

## 2017-02-27 NOTE — Patient Instructions (Signed)
[facility] Discharge Instructions for Patients Receiving Chemotherapy  Today you received the following chemotherapy agents Herceptin  To help prevent nausea and vomiting after your treatment, we encourage you to take your nausea medication as directed   If you develop nausea and vomiting that is not controlled by your nausea medication, call the clinic.   BELOW ARE SYMPTOMS THAT SHOULD BE REPORTED IMMEDIATELY:  *FEVER GREATER THAN 100.5 F  *CHILLS WITH OR WITHOUT FEVER  NAUSEA AND VOMITING THAT IS NOT CONTROLLED WITH YOUR NAUSEA MEDICATION  *UNUSUAL SHORTNESS OF BREATH  *UNUSUAL BRUISING OR BLEEDING  TENDERNESS IN MOUTH AND THROAT WITH OR WITHOUT PRESENCE OF ULCERS  *URINARY PROBLEMS  *BOWEL PROBLEMS  UNUSUAL RASH Items with * indicate a potential emergency and should be followed up as soon as possible.  Feel free to call the clinic should you have any questions or concerns. The clinic phone number is [phone].  Please show the CHEMO ALERT CARD at check-in to the Emergency Department and triage nurse.   

## 2017-02-27 NOTE — Telephone Encounter (Signed)
Gave patient AVs and calendars of upcoming January through May appointments.

## 2017-02-27 NOTE — Progress Notes (Signed)
[facility]  [phone] [phone]) [phone]     ID: NATAUSHA JUNGWIRTH DOB: October 17, 1980  MR#: [id number]  PZW#:[id number]  Patient Care Team: [name], MD as PCP - General (Family Medicine) Stark Klein, MD as Consulting Physician (General Surgery) Magrinat, Virgie Dad, MD as Consulting Physician (Oncology) Eppie Gibson, MD as Attending Physician (Radiation Oncology) Delice Bison Charlestine Massed, NP as Nurse Practitioner (Hematology and Oncology) Scot Dock, NP OTHER MD:  CHIEF COMPLAINT:  Estrogen receptor positive breast cancer  CURRENT TREATMENT: anti-HER-2 immunotherapy, goserelin, tamoxifen  BREAST CANCER HISTORY: From the original intake note:  Kirah noted some itching on her breasts and since this was the symptom that preceded her grandmother's breast cancer she underwent baseline bilateral screening mammography at [facility] 01/10/2016. The breast density was category B. In the left breast central to the nipple there was a 1.7 cm irregular high density mass with pleomorphic calcifications. Accordingly the patient was brought back 01/14/2016 for left diagnostic mammography and ultrasonography. This confirmed a 0.8 cm irregular mass in the left breast at the 10:00 position. 0.5 cm from the nipple lateral and posterior to that there was a 0.2 cm cluster of amorphous calcifications with an associated density. Anterior to the index mass was a 0.5 cm cluster of amorphous calcifications. All 3 findings taken together 3 cm. Ultrasound of the left breast found a 1 cm irregular mass in the left breast at the 10:00 position retroareolarly. There were no other sonographic abnormalities and the left axilla was sonographically benign  On 01/15/2016 the patient underwent core needle biopsy of the left breast index lesion at 10:00, and this found invasive ductal carcinoma, E-cadherin positive, estrogen receptor 90% positive, progesterone receptor 40% positive, both with  moderate staining intensity, with an MIB-1 of 5%, but HER-2 amplified, with a signals ratio of 2.41 and the number per cell 6.50. One of the 2 areas of abnormal calcifications, the more posterior one measuring 0.2 cm, was also biopsied and showed only atypical ductal hyperplasia (S [id number] and [id number]).  Her subsequent history is as detailed below  INTERVAL HISTORY: Teniya returns today for follow-up and treatment of her triple positive breast cancer. She is receiving Trastuzumab and is tolerating it well.  She is also taking Tamoxifen daily without any issues.    REVIEW OF SYSTEMS: Diarra continues to do well.  She had a good holiday.  She works in Landscape architect.  She was happy to have two days off at Christmas time.  She denies any new issues today.  She denies fevers, chills, nausea, vomiting, constipation, diarrhea, headaches, chest pain, palpitations, swelling, or any other concerns.    PAST MEDICAL HISTORY: Past Medical History:  Diagnosis Date  . Anemia    as a child  . Anxiety   . Cholestasis   . Elevated liver enzymes    during pregnancy March 2015  . Family history of breast cancer   . Family history of prostate cancer   . GERD (gastroesophageal reflux disease)    one episode  . Headache   . History of radiation therapy 06/11/16- 07/23/16   Left Breast 50 Gy in 25 fractions, Left Breast boost 10 Gy in 5 fractions.   . Hypothyroidism   . Malignant neoplasm of upper-inner quadrant of left female breast ([facility]) 01/16/2016    PAST SURGICAL HISTORY: Past Surgical History:  Procedure Laterality Date  . BREAST LUMPECTOMY WITH RADIOACTIVE SEED AND SENTINEL LYMPH NODE BIOPSY Left 03/13/2016  Procedure: LEFT BREAST LUMPECTOMY WITH RADIOACTIVE SEED X 2 AND SENTINEL LYMPH NODE BIOPSY;  Surgeon: Stark Klein, MD;  Location: [facility];  Service: General;  Laterality: Left;  . NO PAST SURGERIES    . PORTACATH PLACEMENT Right 03/13/2016   Procedure: INSERTION  PORT-A-CATH;  Surgeon: Stark Klein, MD;  Location: [facility] OR;  Service: General;  Laterality: Right;    FAMILY HISTORY Family History  Problem Relation Age of Onset  . Hypertension Mother   . Hypertension Father   . Prostate cancer Father   . Liver cancer Maternal Grandmother   . Breast cancer Paternal Grandmother        dx in her 76s  . Lung cancer Paternal Aunt        smoker  . Heart attack Paternal Grandfather   . Liver cancer Other   The patient's parents are alive, in their mid 55s as of November 2017. The patient father was diagnosed with prostate cancer at age 60. A paternal aunt was diagnosed with lung cancer in her late 58s. Also the paternal grandmother was diagnosed with breast cancer at the age of 81. On the mother's side there are 2 cases of liver cancer, age of diagnosis unknown.  GYNECOLOGIC HISTORY:  No LMP recorded.  Menarche age 37, first live birth age 37, she is [place] P2. She still having periods, most recently 01/21/2016, but they are no longer regular. She remotely used a number ring and oral contraceptives but stopped in 2016.Currently there use barrier methods for contraception.   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 Her husband [name] works as a Printmaker for [employer] (for example he worked on our (). Their children are [name] and [name],, aged 39 and 2 as of November 2017. Attention and is not a church attender    ADVANCED DIRECTIVES: Not in place   HEALTH MAINTENANCE: Social History   Tobacco Use  . Smoking status: Never Smoker  . Smokeless tobacco: Never Used  Substance Use Topics  . Alcohol use: No  . Drug use: No     Colonoscopy:  PAP:2016  Bone density:   Allergies  Allergen Reactions  . Amoxicillin Diarrhea and Nausea Only    Has patient had a PCN reaction causing immediate rash, facial/tongue/throat swelling, SOB or lightheadedness with hypotension: No Has patient  had a PCN reaction causing severe rash involving mucus membranes or skin necrosis: No Has patient had a PCN reaction that required hospitalization No Has patient had a PCN reaction occurring within the last 10 years: Yes If all of the above answers are "NO", then may proceed with Cephalosporin use.     Current Outpatient Medications  Medication Sig Dispense Refill  . acetaminophen (TYLENOL) 500 MG tablet Take 500-1,000 mg by mouth every 6 (six) hours as needed (for headaches.).    Marland Kitchen fluticasone (FLONASE) 50 MCG/ACT nasal spray Place 1-2 sprays into both nostrils daily as needed for allergies or rhinitis.    Marland Kitchen goserelin (ZOLADEX) 10.8 MG injection Inject 10.8 mg into the skin every 30 (thirty) days. She is not sure of the exact dose    . ibuprofen (ADVIL,MOTRIN) 200 MG tablet Take 400 mg by mouth every 8 (eight) hours as needed (for pain.).    Marland Kitchen lidocaine-prilocaine (EMLA) cream Apply 1 application topically as needed. 30 g 0  . Multiple Vitamins-Minerals (MULTIVITAMIN GUMMIES ADULT PO) Take 2 tablets by mouth daily.    . tamoxifen (NOLVADEX)  20 MG tablet Take 1 tablet (20 mg total) daily by mouth. 30 tablet 3   No current facility-administered medications for this visit.     OBJECTIVE:  Vitals:   02/27/17 1459  BP: (!) 136/94  Pulse: 73  Resp: 18  Temp: 98.6 F (37 C)  SpO2: 98%     Body mass index is 31.47 kg/m.    ECOG FS:1 - Symptomatic but completely ambulatory  GENERAL: Patient is a well appearing female in no acute distress HEENT:  Sclerae anicteric.  Oropharynx clear and moist. No ulcerations or evidence of oropharyngeal candidiasis. Neck is supple.  NODES:  No cervical, supraclavicular, or axillary lymphadenopathy palpated.  BREAST EXAM:  Left breast s/p lumpectomy.  Radiation changes and swelling noted, no nodules or masses, right breast without nodules, masses, skin or nipple changes. LUNGS:  Clear to auscultation bilaterally.  No wheezes or rhonchi. HEART:  Regular  rate and rhythm. No murmur appreciated. ABDOMEN:  Soft, nontender.  Positive, normoactive bowel sounds. No organomegaly palpated. MSK:  No focal spinal tenderness to palpation. Full range of motion bilaterally in the upper extremities. EXTREMITIES:  No peripheral edema.   SKIN:  Clear with no obvious rashes or skin changes. No nail dyscrasia. NEURO:  Nonfocal. Well oriented.  Appropriate affect.    LAB RESULTS:  CMP     Component Value Date/Time   NA 142 02/27/2017 1350   K 3.7 02/27/2017 1350   CL 107 03/07/2016 1612   CO2 28 02/27/2017 1350   GLUCOSE 124 02/27/2017 1350   BUN 8.9 02/27/2017 1350   CREATININE 0.8 02/27/2017 1350   CALCIUM 9.5 02/27/2017 1350   PROT 7.3 02/27/2017 1350   ALBUMIN 3.8 02/27/2017 1350   AST 21 02/27/2017 1350   ALT 25 02/27/2017 1350   ALKPHOS 82 02/27/2017 1350   BILITOT 0.22 02/27/2017 1350   GFRNONAA >60 03/07/2016 1612   GFRAA >60 03/07/2016 1612    INo results found for: SPEP, UPEP  Lab Results  Component Value Date   WBC 4.8 02/27/2017   NEUTROABS 2.8 02/27/2017   HGB 12.4 02/27/2017   HCT 38.9 02/27/2017   MCV 85.1 02/27/2017   PLT 156 02/27/2017      Chemistry      Component Value Date/Time   NA 142 02/27/2017 1350   K 3.7 02/27/2017 1350   CL 107 03/07/2016 1612   CO2 28 02/27/2017 1350   BUN 8.9 02/27/2017 1350   CREATININE 0.8 02/27/2017 1350      Component Value Date/Time   CALCIUM 9.5 02/27/2017 1350   ALKPHOS 82 02/27/2017 1350   AST 21 02/27/2017 1350   ALT 25 02/27/2017 1350   BILITOT 0.22 02/27/2017 1350       No results found for: LABCA2  No components found for: LABCA125  No results for input(s): INR in the last 168 hours.  Urinalysis    Component Value Date/Time   COLORURINE YELLOW 10/14/2012 1954   APPEARANCEUR CLEAR 10/14/2012 1954   LABSPEC >1.030 (H) 10/14/2012 1954   PHURINE 6.0 10/14/2012 1954   GLUCOSEU NEGATIVE 10/14/2012 1954   HGBUR TRACE (A) 10/14/2012 1954   BILIRUBINUR  NEGATIVE 10/14/2012 1954   KETONESUR 15 (A) 10/14/2012 1954   PROTEINUR NEGATIVE 10/14/2012 1954   UROBILINOGEN 0.2 10/14/2012 1954   NITRITE NEGATIVE 10/14/2012 1954   LEUKOCYTESUR NEGATIVE 10/14/2012 1954     STUDIES: No results found.   ELIGIBLE FOR AVAILABLE RESEARCH PROTOCOL: no  ASSESSMENT: 37 y.o. [place] woman status post  left breast upper inner quadrant biopsy 01/15/2016 for a clinical T1c N0, stage IA invasive ductal carcinoma, E-cadherin positive, estrogen receptor 90% positive, progesterone receptor 40% positive, both with moderate staining intensity, with an MIB-1 of 5%, but HER-2 amplified, with a signals ratio of 2.41 and the number per cell 6.50.  (a) One of 2 areas of abnormal calcifications, the more posterior one measuring 0.2 cm, was also biopsied and showed only atypical ductal hyperplasia  (b) a 4.1 cm area of non-masslike enhancement in the left breast underwent biopsy 02/19/2016 showing only fibrocystic changes  (1) tamoxifen started 01/23/2016 neoadjuvantly  (2) genetics testing 02/22/2016 through the Breast/Ovarian gene panel offered by [facility] found no deleterious mutations in  ATM, BARD1, BRCA1, BRCA2, BRIP1, CDH1, CHEK2, EPCAM, FANCC, MLH1, MSH2, MSH6, NBN, PALB2, PMS2, PTEN, RAD51C, RAD51D, TP53, and XRCC2.     (3) status post left lumpectomy and sentinel lymph node sampling 03/13/2016 for an mpT1c pN0, stage IA invasive ductal carcinoma, grade 2, with negative margins.  (4) paclitaxel weekly 12 and trastuzumab every 21 days beginning on 04/10/2016.  (a) paclitaxel discontinued after 5 doses because of atypical neuropathy symptoms. Last dose 04/28/2016  (5) trastuzumab to be continued to complete a year--through February 2019  (a) echocardiogram 02/11/2016 shows ejection fraction in the 60-65% range.  (b) echo 05/28/2916 shows EF in the 65-70% range  (c) echo on 09/26/2016 shows LVEF of 55-60%  (d) echo on 01/14/2017 shows EF in the 60-65%  (6) adjuvant  radiation 06/11/16 - 07/23/16 Site/dose:    1) Left Breast / 50 Gy in 25 fractions 2) Left Breast Boost / 10 Gy in 5 fractions  (7) tamoxifen resumed to 07/25/2016  (8) patient resumed menses post chemo, April 2018  (a) goserelin started 06/17/2016, initially given every 28 days, however changed to every 12 weeks on 11/12/16 with estradiol monitoring.  PLAN:  Audia is doing well today.  She will continue taking Tamoxifen and continue with Trastuzumab every 3 weeks.  She has two more cycles of this.  She will see Dr. Jana Hakim with her last cycle.  She has appt with Dr. Haroldine Laws on 04/16/2017 for her cardiac evaluation. She will continue Goserelin every 12 weeks. She has a standing lab order to check her estradiol level every 80 days to ensure Goserelin is providing adequate ovarian suppression.   Kacee will see Dr. Jana Hakim for follow up at her final Trastuzumab.  She will then return to see me three months later for SCP visit.    She knows to call for any problems that may develop before the next visit here.  A total of (30) minutes of face-to-face time was spent with this patient with greater than 50% of that time in counseling and care-coordination.   Scot Dock, NP   02/27/2017 3:00 PM Medical Oncology and Hematology [facility] [address] [place], [place] [address] Tel. [phone]    Fax. [phone]

## 2017-03-02 ENCOUNTER — Ambulatory Visit: Payer: PRIVATE HEALTH INSURANCE | Attending: Adult Health | Admitting: Physical Therapy

## 2017-03-02 DIAGNOSIS — C50212 Malignant neoplasm of upper-inner quadrant of left female breast: Secondary | ICD-10-CM | POA: Insufficient documentation

## 2017-03-02 DIAGNOSIS — R293 Abnormal posture: Secondary | ICD-10-CM | POA: Insufficient documentation

## 2017-03-02 DIAGNOSIS — I890 Lymphedema, not elsewhere classified: Secondary | ICD-10-CM | POA: Insufficient documentation

## 2017-03-02 DIAGNOSIS — Z170 Estrogen receptor positive status [ER+]: Secondary | ICD-10-CM | POA: Diagnosis present

## 2017-03-02 NOTE — Therapy (Signed)
[place], [place], [address] Phone: [phone]   Fax:  [phone]  Physical Therapy Evaluation  Patient Details  Name: Virginia [name] MRN: [id number] Date of Birth: December 24, 1980 Referring Provider: Thedore Mins, NP   Encounter Date: 03/02/2017  PT End of Session - 03/02/17 1704    Visit Number  1    Number of Visits  4    Date for PT Re-Evaluation  04/02/17    PT Start Time  1607    PT Stop Time  1654    PT Time Calculation (min)  47 min    Activity Tolerance  Patient tolerated treatment well    Behavior During Therapy  [facility] for tasks assessed/performed       Past Medical History:  Diagnosis Date  . Anemia    as a child  . Anxiety   . Cholestasis   . Elevated liver enzymes    during pregnancy March 2015  . Family history of breast cancer   . Family history of prostate cancer   . GERD (gastroesophageal reflux disease)    one episode  . Headache   . History of radiation therapy 06/11/16- 07/23/16   Left Breast 50 Gy in 25 fractions, Left Breast boost 10 Gy in 5 fractions.   . Hypothyroidism   . Malignant neoplasm of upper-inner quadrant of left female breast ([place]) 01/16/2016    Past Surgical History:  Procedure Laterality Date  . BREAST LUMPECTOMY WITH RADIOACTIVE SEED AND SENTINEL LYMPH NODE BIOPSY Left 03/13/2016   Procedure: LEFT BREAST LUMPECTOMY WITH RADIOACTIVE SEED X 2 AND SENTINEL LYMPH NODE BIOPSY;  Surgeon: Stark Klein, MD;  Location: [place];  Service: General;  Laterality: Left;  . NO PAST SURGERIES    . PORTACATH PLACEMENT Right 03/13/2016   Procedure: INSERTION PORT-A-CATH;  Surgeon: Stark Klein, MD;  Location: [place];  Service: General;  Laterality: Right;    There were no vitals filed for this visit.   Subjective Assessment - 03/02/17 1611    Subjective  I'm noticing fullness and swelling on the affected breast that started after surgery; it comes and goes.    Pertinent History  Status post left lumpectomy and sentinel lymph node sampling 03/13/2016 for an mpT1c pN0, stage IA invasive ductal carcinoma, grade 2, with negative margins. Completed XRT in May or June 2018; is on herceptin (to finish Feb. 15), zolodex and tamoxifen.  Otherwise healthy.    Patient Stated Goals  to reduce the swelling so I can be more comfortable when I wear a bra and when I sleep (I sleep on my left side, and when I do, it's more pronounced the next day)    Currently in Pain?  No/denies         [facility] PT Assessment - 03/02/17 0001      Assessment   Medical Diagnosis  left breast cancer and SLNB    Referring Provider  Thedore Mins, NP    Onset Date/Surgical Date  03/13/16    Hand Dominance  Left ambidextrous    Prior Therapy  none      Precautions   Precautions  Other (comment)    Precaution Comments  cancer precautions      Restrictions   Weight Bearing Restrictions  No      Balance Screen   Has the patient fallen in the past 6 months  No    Has the patient had a decrease in activity level because of a  fear of falling?   No    Is the patient reluctant to leave their home because of a fear of falling?   No      Home Environment   Living Environment  Private residence    Living Arrangements  Spouse/significant other;Children    Type of [place]  One level      Prior Function   Level of Independence  Independent    Vocation  Full time employment    Loss adjuster, chartered in care management, predominantly [place]  tries to go to the gym at least 3 times a week; treadmill walking or walking + running, lifting weights a bit      Cognition   Overall Cognitive Status  Within Functional Limits for tasks assessed      Posture/Postural Control   Posture/Postural Control  Postural limitations    Postural Limitations  Forward head      ROM / Strength   AROM / PROM / Strength  AROM      AROM   Overall AROM Comments  both  shoulders grossly WFL, with tightness/discomfort at end range on left        LYMPHEDEMA/ONCOLOGY QUESTIONNAIRE - 03/02/17 1624      Type   Cancer Type  left breast triple positive      Surgeries   Lumpectomy Date  03/13/16    Sentinel Lymph Node Biopsy Date  03/13/16      Treatment   Active Chemotherapy Treatment  Yes herceptin, zoladex    Past Radiation Treatment  Yes    Date  07/25/16 approx.    Body Site  left breast, possibly axilla    Current Hormone Treatment  Yes    Drug Name  tamoxifen, zoladex      Lymphedema Assessments   Lymphedema Assessments  Upper extremities      Right Upper Extremity Lymphedema   10 cm Proximal to Olecranon Process  32.1 cm    Olecranon Process  28.6 cm    10 cm Proximal to Ulnar Styloid Process  23.9 cm    Just Proximal to Ulnar Styloid Process  18.2 cm    Across Hand at [employer]  21.8 cm    At [place] of 2nd Digit  7.4 cm      Left Upper Extremity Lymphedema   10 cm Proximal to Olecranon Process  33 cm    Olecranon Process  28.3 cm    10 cm Proximal to Ulnar Styloid Process  24 cm    Just Proximal to Ulnar Styloid Process  18.5 cm    Across Hand at [employer]  21 cm    At [place] of 2nd Digit  6.9 cm          Objective measurements completed on examination: See above findings.      [place] Adult PT Treatment/Exercise - 03/02/17 0001      Self-Care   Self-Care  Other Self-Care Comments    Other Self-Care Comments   fashioned foam chips in stockinette and educated patient about tucking it in her bra at area of firm breast tissue; also could use it with smocked vest that she may try to wear at night             PT Education - 03/02/17 1703    Education provided  Yes    Education Details  about compression bras or sports bras and features  that make them her best option, and about where to obtain one; about using foam chip pack    Person(s) Educated  Patient    Methods  Explanation    Comprehension  Verbalized  understanding          PT Long Term Goals - 03/02/17 1711      PT LONG TERM GOAL #1   Title  Pt. will be independent in self-manual lymph drainage.    Time  3    Period  Weeks    Status  New      PT LONG TERM GOAL #2   Title  Pt. will know where and how to obtain appropriate compression bras.    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  She completed radiation in May or June of last year.  She is still on herceptin for another month, is on Zoladex and tamoxifen.  She has left breast swelling, induration, and discomfort.     History and Personal Factors relevant to plan of care:  none    Clinical Presentation  Stable    Clinical Decision Making  Low    Rehab Potential  Excellent    PT Frequency  1x / week    PT Duration  2 weeks to 3 weeks prn    PT Treatment/Interventions  ADLs/Self Care Home Management;Patient/family education;Manual techniques;Manual lymph drainage;Compression bandaging;Scar mobilization;Taping    PT Next Visit Plan  Manual lymph drainage to left breast and instruction in same; assess benefit of foam chip pack; check her sports bra if she brings or wears it; answer questions about compression bras.    Consulted and Agree with Plan of Care  Patient       Patient will benefit from skilled therapeutic intervention in order to improve the following deficits and impairments:  Increased edema, Pain, Decreased knowledge of precautions, Decreased knowledge of use of DME  Visit Diagnosis: Lymphedema, not elsewhere classified - Plan: PT plan of care cert/re-cert     Problem List Patient Active Problem List   Diagnosis Date Noted  . Port-A-Cath in place 02/27/2017  . Headache disorder 09/26/2016  . Genetic testing 02/26/2016  . Family history of breast  cancer   . Family history of prostate cancer   . Malignant neoplasm of upper-inner quadrant of left breast in female, estrogen receptor positive ([place]) 01/16/2016  . Postpartum state 05/20/2013  . Cholelithiasis complicating pregnancy, antepartum 05/18/2013  . Cholestasis of pregnancy 04/28/2013    Virginia [name] 03/02/2017, 5:14 PM  [place], [place], [address] Phone: [phone]   Fax:  [phone]  Name: Virginia [name] MRN: [id number] Date of Birth: Dec 14, 1980  Serafina Royals, PT 03/02/17 5:14 PM

## 2017-03-05 ENCOUNTER — Ambulatory Visit: Payer: PRIVATE HEALTH INSURANCE

## 2017-03-05 DIAGNOSIS — I890 Lymphedema, not elsewhere classified: Secondary | ICD-10-CM | POA: Diagnosis not present

## 2017-03-05 DIAGNOSIS — Z170 Estrogen receptor positive status [ER+]: Secondary | ICD-10-CM

## 2017-03-05 DIAGNOSIS — C50212 Malignant neoplasm of upper-inner quadrant of left female breast: Secondary | ICD-10-CM

## 2017-03-05 DIAGNOSIS — R293 Abnormal posture: Secondary | ICD-10-CM

## 2017-03-05 NOTE — Therapy (Signed)
[place], [place], [address] Phone: [phone]   Fax:  [phone]  Physical Therapy Treatment  Patient Details  Name: Virginia [name] MRN: [id number] Date of Birth: 09/13/1980 Referring Provider: Thedore Mins, NP   Encounter Date: 03/05/2017  PT End of Session - 03/05/17 0946    Visit Number  2    Number of Visits  4    Date for PT Re-Evaluation  04/02/17    PT Start Time  0850    PT Stop Time  0935    PT Time Calculation (min)  45 min    Activity Tolerance  Patient tolerated treatment well    Behavior During Therapy  [facility] for tasks assessed/performed       Past Medical History:  Diagnosis Date  . Anemia    as a child  . Anxiety   . Cholestasis   . Elevated liver enzymes    during pregnancy March 2015  . Family history of breast cancer   . Family history of prostate cancer   . GERD (gastroesophageal reflux disease)    one episode  . Headache   . History of radiation therapy 06/11/16- 07/23/16   Left Breast 50 Gy in 25 fractions, Left Breast boost 10 Gy in 5 fractions.   . Hypothyroidism   . Malignant neoplasm of upper-inner quadrant of left female breast ([place]) 01/16/2016    Past Surgical History:  Procedure Laterality Date  . BREAST LUMPECTOMY WITH RADIOACTIVE SEED AND SENTINEL LYMPH NODE BIOPSY Left 03/13/2016   Procedure: LEFT BREAST LUMPECTOMY WITH RADIOACTIVE SEED X 2 AND SENTINEL LYMPH NODE BIOPSY;  Surgeon: Stark Klein, MD;  Location: [place];  Service: General;  Laterality: Left;  . NO PAST SURGERIES    . PORTACATH PLACEMENT Right 03/13/2016   Procedure: INSERTION PORT-A-CATH;  Surgeon: Stark Klein, MD;  Location: [place];  Service: General;  Laterality: Right;    There were no vitals filed for this visit.  Subjective Assessment - 03/05/17 0853    Subjective  Been wearing the chip pack and it has been leaving the indentations and I'm moving the chip pack prn. Also been wearing  my sports bra and I can tell the fluid softens briefly but it doesn't stay that way for long. Tried sleeping in the binder but it was too uncomfortable.     Pertinent History  Status post left lumpectomy and sentinel lymph node sampling 03/13/2016 for an mpT1c pN0, stage IA invasive ductal carcinoma, grade 2, with negative margins. Completed XRT in May or June 2018; is on herceptin (to finish Feb. 15), zolodex and tamoxifen.  Otherwise healthy.    Patient Stated Goals  to reduce the swelling so I can be more comfortable when I wear a bra and when I sleep (I sleep on my left side, and when I do, it's more pronounced the next day)    Currently in Pain?  No/denies                      [facility] Adult PT Treatment/Exercise - 03/05/17 0001      Manual Therapy   Manual Therapy  Manual Lymphatic Drainage (MLD)    Manual Lymphatic Drainage (MLD)  In Supine: Short neck, superficial and deep abdominals, Lt inguinal and Rt axillary nodes, Lt axillo-inguinal and anterior inter-axillary anastomosis then focused on Lt breast; then into Rt S/L for continued Lt axillo-inguinal and posterior inter-axillary anastomosis.  PT Education - 03/05/17 0946    Education provided  Yes    Education Details  Self manual lymph drainage    Person(s) Educated  Patient    Methods  Explanation;Demonstration;Handout    Comprehension  Verbalized understanding;Returned demonstration;Need further instruction          PT Long Term Goals - 03/02/17 1711      PT LONG TERM GOAL #1   Title  Pt. will be independent in self-manual lymph drainage.    Time  3    Period  Weeks    Status  New      PT LONG TERM GOAL #2   Title  Pt. will know where and how to obtain appropriate compression bras.    Time  3    Period  Weeks    Status  New            Plan - 03/05/17 0947    Clinical Impression Statement  First session of manual lymph drainage to Lt breast today. Pt verbalized good  understanding of basics of anatomy of lymphatic system and sequence of MLD. She was able to return good demonstration of technique with hand over hand pressure for instruction. Issued handout for pt to begin trying this at home over weekend as she demonstrated good understanding of techique and principles. She brought sports bras she wears (2 at a time) and these are good for now but pt does plan to get a compression bra as well. Tried sleeping in velcro binder but this was too uncomfortable.     Rehab Potential  Excellent    PT Frequency  1x / week    PT Duration  2 weeks to 3 weeks prn    PT Treatment/Interventions  ADLs/Self Care Home Management;Patient/family education;Manual techniques;Manual lymph drainage;Compression bandaging;Scar mobilization;Taping    PT Next Visit Plan  Cont manual lymph drainage to left breast and review in same; assess benefit of foam chip pack; check her sports bra if she brings or wears it; answer questions about compression bras.    Consulted and Agree with Plan of Care  Patient       Patient will benefit from skilled therapeutic intervention in order to improve the following deficits and impairments:  Increased edema, Pain, Decreased knowledge of precautions, Decreased knowledge of use of DME  Visit Diagnosis: Lymphedema, not elsewhere classified  Carcinoma of upper-inner quadrant of left breast in female, estrogen receptor positive ([place])  Abnormal posture     Problem List Patient Active Problem List   Diagnosis Date Noted  . Port-A-Cath in place 02/27/2017  . Headache disorder 09/26/2016  . Genetic testing 02/26/2016  . Family history of breast cancer   . Family history of prostate cancer   . Malignant neoplasm of upper-inner quadrant of left breast in female, estrogen receptor positive ([place]) 01/16/2016  . Postpartum state 05/20/2013  . Cholelithiasis complicating pregnancy, antepartum 05/18/2013  . Cholestasis of pregnancy 04/28/2013     Otelia Limes, PTA 03/05/2017, 11:06 AM  [place], [place], [address] Phone: [phone]   Fax:  [phone]  Name: Virginia [name] MRN: [id number] Date of Birth: Oct 21, 1980

## 2017-03-05 NOTE — Patient Instructions (Addendum)
Self manual lymph drainage: Perform this sequence once a day.  Only give enough pressure no your skin to make the skin move. Start with circles near the neck above the collarbones 10 times.  Diaphragmatic - Supine   Inhale through nose making navel move out toward hands. Exhale through puckered lips, hands follow navel in. Repeat _5__ times. Rest _10__ seconds between repeats.   Copyright  VHI. All rights reserved.  Hug yourself.  Do circles at your neck just above your collarbones.  Repeat this 10 times.  Axilla - One at a Time   Using full weight of flat hand and fingers at center of uninvolved armpit, make _10__ in-place circles.   Copyright  VHI. All rights reserved.  LEG: Inguinal Nodes Stimulation   With small finger side of hand against hip crease on involved side, gently perform circles at the crease. Repeat __10_ times.   Copyright  VHI. All rights reserved.  1) Axilla to Inguinal Nodes - Sweep   On involved side, sweep _4__ times from armpit along side of trunk to hip crease.  Now gently stretch skin from the involved side to the uninvolved side across the chest at the shoulder line.  Repeat that 4 times.  Draw an imaginary diagonal line from upper outer breast through the nipple area toward lower inner breast.  Direct fluid upward and inward from this line toward the pathway across your upper chest .  Do this in three rows to treat all of the upper inner breast tissue, and do each row 3-4x.      Direct fluid to treat all of lower outer breast tissue downward and outward toward  pathway that is aimed at the left groin.  Finish by doing the pathways as described above going from your involved armpit to the same side groin and going across your upper chest from the involved shoulder to the uninvolved shoulder.  Repeat the steps above where you do circles in your left groin and right armpit. Copyright  VHI. All rights reserved.

## 2017-03-09 ENCOUNTER — Other Ambulatory Visit: Payer: Self-pay | Admitting: General Surgery

## 2017-03-11 ENCOUNTER — Ambulatory Visit: Payer: PRIVATE HEALTH INSURANCE

## 2017-03-11 DIAGNOSIS — I890 Lymphedema, not elsewhere classified: Secondary | ICD-10-CM | POA: Diagnosis not present

## 2017-03-11 DIAGNOSIS — Z170 Estrogen receptor positive status [ER+]: Secondary | ICD-10-CM

## 2017-03-11 DIAGNOSIS — C50212 Malignant neoplasm of upper-inner quadrant of left female breast: Secondary | ICD-10-CM

## 2017-03-11 NOTE — Therapy (Signed)
[place], [place], [address] Phone: [phone]   Fax:  [phone]  Physical Therapy Treatment  Patient Details  Name: Virginia [name] MRN: [id number] Date of Birth: Jun 08, 1980 Referring Provider: Thedore Mins, NP   Encounter Date: 03/11/2017  PT End of Session - 03/11/17 1604    Visit Number  3    Number of Visits  4    Date for PT Re-Evaluation  04/02/17    PT Start Time  0630    PT Stop Time  1603    PT Time Calculation (min)  40 min    Activity Tolerance  Patient tolerated treatment well    Behavior During Therapy  [facility] for tasks assessed/performed       Past Medical History:  Diagnosis Date  . Anemia    as a child  . Anxiety   . Cholestasis   . Elevated liver enzymes    during pregnancy March 2015  . Family history of breast cancer   . Family history of prostate cancer   . GERD (gastroesophageal reflux disease)    one episode  . Headache   . History of radiation therapy 06/11/16- 07/23/16   Left Breast 50 Gy in 25 fractions, Left Breast boost 10 Gy in 5 fractions.   . Hypothyroidism   . Malignant neoplasm of upper-inner quadrant of left female breast ([place]) 01/16/2016    Past Surgical History:  Procedure Laterality Date  . BREAST LUMPECTOMY WITH RADIOACTIVE SEED AND SENTINEL LYMPH NODE BIOPSY Left 03/13/2016   Procedure: LEFT BREAST LUMPECTOMY WITH RADIOACTIVE SEED X 2 AND SENTINEL LYMPH NODE BIOPSY;  Surgeon: Stark Klein, MD;  Location: [facility] [place];  Service: General;  Laterality: Left;  . NO PAST SURGERIES    . PORTACATH PLACEMENT Right 03/13/2016   Procedure: INSERTION PORT-A-CATH;  Surgeon: Stark Klein, MD;  Location: [place];  Service: General;  Laterality: Right;    There were no vitals filed for this visit.  Subjective Assessment - 03/11/17 1526    Subjective  Been doing the self MLD and I think it's going well though at times I'm not sure if I'm pushing too hard. But I've been  doing ti every day and I can tell the inside of my Lt breast is softer now. Also been wearing the velcro binder with the chip pack througout the day.     Pertinent History  Status post left lumpectomy and sentinel lymph node sampling 03/13/2016 for an mpT1c pN0, stage IA invasive ductal carcinoma, grade 2, with negative margins. Completed XRT in May or June 2018; is on herceptin (to finish Feb. 15), zolodex and tamoxifen.  Otherwise healthy.    Patient Stated Goals  to reduce the swelling so I can be more comfortable when I wear a bra and when I sleep (I sleep on my left side, and when I do, it's more pronounced the next day)    Currently in Pain?  No/denies                      [facility] Adult PT Treatment/Exercise - 03/11/17 0001      Manual Therapy   Manual Therapy  Manual Lymphatic Drainage (MLD)    Manual Lymphatic Drainage (MLD)  In Supine: Short neck, superficial and deep abdominals, Lt inguinal and Rt axillary nodes, Lt axillo-inguinal and anterior inter-axillary anastomosis then focused on Lt breast; then into Rt S/L for continued Lt axillo-inguinal and posterior inter-axillary anastomosis reviewing with  pt throughout                   PT Long Term Goals - 03/02/17 1711      PT LONG TERM GOAL #1   Title  Pt. will be independent in self-manual lymph drainage.    Time  3    Period  Weeks    Status  New      PT LONG TERM GOAL #2   Title  Pt. will know where and how to obtain appropriate compression bras.    Time  3    Period  Weeks    Status  New            Plan - 03/11/17 1604    Clinical Impression Statement  Overall pt is doing very well with self MLD and has definite softening of fibrotic fluid at inferior Lt breast since last treatment. Reviewed while performing manual lymph drainage today and sent order to Wilber Bihari, NP for compression bra.     Rehab Potential  Excellent    PT Frequency  1x / week    PT Duration  2 weeks to 3 weeks prn     PT Treatment/Interventions  ADLs/Self Care Home Management;Patient/family education;Manual techniques;Manual lymph drainage;Compression bandaging;Scar mobilization;Taping    PT Next Visit Plan  Cont manual lymph drainage to left breast and review in same; issue script for compression bra if returned signed. Possible D/C per poc next visit.     Consulted and Agree with Plan of Care  Patient       Patient will benefit from skilled therapeutic intervention in order to improve the following deficits and impairments:  Increased edema, Pain, Decreased knowledge of precautions, Decreased knowledge of use of DME  Visit Diagnosis: Lymphedema, not elsewhere classified  Carcinoma of upper-inner quadrant of left breast in female, estrogen receptor positive ([place])     Problem List Patient Active Problem List   Diagnosis Date Noted  . Port-A-Cath in place 02/27/2017  . Headache disorder 09/26/2016  . Genetic testing 02/26/2016  . Family history of breast cancer   . Family history of prostate cancer   . Malignant neoplasm of upper-inner quadrant of left breast in female, estrogen receptor positive ([place]) 01/16/2016  . Postpartum state 05/20/2013  . Cholelithiasis complicating pregnancy, antepartum 05/18/2013  . Cholestasis of pregnancy 04/28/2013    Otelia Limes, PTA 03/11/2017, 4:07 PM  [place], [place], [address] Phone: [phone]   Fax:  [phone]  Name: Virginia [name] MRN: [id number] Date of Birth: [birth year]

## 2017-03-20 ENCOUNTER — Inpatient Hospital Stay: Payer: PRIVATE HEALTH INSURANCE

## 2017-03-20 VITALS — BP 130/89 | HR 68 | Temp 97.9°F | Resp 16

## 2017-03-20 DIAGNOSIS — C50212 Malignant neoplasm of upper-inner quadrant of left female breast: Secondary | ICD-10-CM

## 2017-03-20 DIAGNOSIS — Z170 Estrogen receptor positive status [ER+]: Secondary | ICD-10-CM

## 2017-03-20 DIAGNOSIS — Z95828 Presence of other vascular implants and grafts: Secondary | ICD-10-CM

## 2017-03-20 DIAGNOSIS — Z5112 Encounter for antineoplastic immunotherapy: Secondary | ICD-10-CM | POA: Diagnosis not present

## 2017-03-20 LAB — CBC WITH DIFFERENTIAL/PLATELET
Basophils Absolute: 0 10*3/uL (ref 0.0–0.1)
Basophils Relative: 1 %
Eosinophils Absolute: 0.1 10*3/uL (ref 0.0–0.5)
Eosinophils Relative: 2 %
HCT: 38 % (ref 34.8–46.6)
Hemoglobin: 12.1 g/dL (ref 11.6–15.9)
Lymphocytes Relative: 45 %
Lymphs Abs: 1.7 10*3/uL (ref 0.9–3.3)
MCH: 27.1 pg (ref 25.1–34.0)
MCHC: 31.8 g/dL (ref 31.5–36.0)
MCV: 85.2 fL (ref 79.5–101.0)
Monocytes Absolute: 0.5 10*3/uL (ref 0.1–0.9)
Monocytes Relative: 13 %
Neutro Abs: 1.4 10*3/uL — ABNORMAL LOW (ref 1.5–6.5)
Neutrophils Relative %: 39 %
Platelets: 176 10*3/uL (ref 145–400)
RBC: 4.46 MIL/uL (ref 3.70–5.45)
RDW: 13.6 % (ref 11.2–16.1)
WBC: 3.7 10*3/uL — ABNORMAL LOW (ref 3.9–10.3)

## 2017-03-20 LAB — COMPREHENSIVE METABOLIC PANEL
ALT: 48 U/L (ref 0–55)
AST: 30 U/L (ref 5–34)
Albumin: 3.9 g/dL (ref 3.5–5.0)
Alkaline Phosphatase: 96 U/L (ref 40–150)
Anion gap: 8 (ref 3–11)
BUN: 10 mg/dL (ref 7–26)
CO2: 27 mmol/L (ref 22–29)
Calcium: 9.3 mg/dL (ref 8.4–10.4)
Chloride: 106 mmol/L (ref 98–109)
Creatinine, Ser: 0.84 mg/dL (ref 0.60–1.10)
GFR calc Af Amer: >60 mL/min (ref >60)
GFR calc non Af Amer: >60 mL/min (ref >60)
Glucose, Bld: 88 mg/dL (ref 70–140)
Potassium: 3.8 mmol/L (ref 3.3–4.7)
Sodium: 141 mmol/L (ref 136–145)
Total Bilirubin: <0.2 mg/dL — ABNORMAL LOW (ref 0.2–1.2)
Total Protein: 7.5 g/dL (ref 6.4–8.3)

## 2017-03-20 MED ORDER — DIPHENHYDRAMINE HCL 25 MG PO CAPS
25.0000 mg | ORAL_CAPSULE | Freq: Once | ORAL | Status: AC
  Administered 2017-03-20: 25 mg via ORAL

## 2017-03-20 MED ORDER — TRASTUZUMAB CHEMO 150 MG IV SOLR
6.0000 mg/kg | Freq: Once | INTRAVENOUS | Status: AC
  Administered 2017-03-20: 504 mg via INTRAVENOUS
  Filled 2017-03-20: qty 24

## 2017-03-20 MED ORDER — ACETAMINOPHEN 325 MG PO TABS
650.0000 mg | ORAL_TABLET | Freq: Once | ORAL | Status: AC
  Administered 2017-03-20: 650 mg via ORAL

## 2017-03-20 MED ORDER — DIPHENHYDRAMINE HCL 25 MG PO CAPS
ORAL_CAPSULE | ORAL | Status: AC
  Filled 2017-03-20: qty 1

## 2017-03-20 MED ORDER — SODIUM CHLORIDE 0.9% FLUSH
10.0000 mL | Freq: Once | INTRAVENOUS | Status: AC
  Administered 2017-03-20: 10 mL
  Filled 2017-03-20: qty 10

## 2017-03-20 MED ORDER — SODIUM CHLORIDE 0.9% FLUSH
10.0000 mL | INTRAVENOUS | Status: DC | PRN
  Administered 2017-03-20: 10 mL
  Filled 2017-03-20: qty 10

## 2017-03-20 MED ORDER — ACETAMINOPHEN 325 MG PO TABS
ORAL_TABLET | ORAL | Status: AC
  Filled 2017-03-20: qty 2

## 2017-03-20 MED ORDER — HEPARIN SOD (PORK) LOCK FLUSH 100 UNIT/ML IV SOLN
500.0000 [IU] | Freq: Once | INTRAVENOUS | Status: AC | PRN
  Administered 2017-03-20: 500 [IU]
  Filled 2017-03-20: qty 5

## 2017-03-20 MED ORDER — SODIUM CHLORIDE 0.9 % IV SOLN
Freq: Once | INTRAVENOUS | Status: AC
  Administered 2017-03-20: 16:00:00 via INTRAVENOUS

## 2017-03-20 NOTE — Patient Instructions (Signed)
[facility] Discharge Instructions for Patients Receiving Chemotherapy  Today you received the following chemotherapy agents Herceptin  To help prevent nausea and vomiting after your treatment, we encourage you to take your nausea medication as directed   If you develop nausea and vomiting that is not controlled by your nausea medication, call the clinic.   BELOW ARE SYMPTOMS THAT SHOULD BE REPORTED IMMEDIATELY:  *FEVER GREATER THAN 100.5 F  *CHILLS WITH OR WITHOUT FEVER  NAUSEA AND VOMITING THAT IS NOT CONTROLLED WITH YOUR NAUSEA MEDICATION  *UNUSUAL SHORTNESS OF BREATH  *UNUSUAL BRUISING OR BLEEDING  TENDERNESS IN MOUTH AND THROAT WITH OR WITHOUT PRESENCE OF ULCERS  *URINARY PROBLEMS  *BOWEL PROBLEMS  UNUSUAL RASH Items with * indicate a potential emergency and should be followed up as soon as possible.  Feel free to call the clinic should you have any questions or concerns. The clinic phone number is [phone].  Please show the CHEMO ALERT CARD at check-in to the Emergency Department and triage nurse.   

## 2017-03-23 ENCOUNTER — Ambulatory Visit: Payer: PRIVATE HEALTH INSURANCE

## 2017-03-23 DIAGNOSIS — C50212 Malignant neoplasm of upper-inner quadrant of left female breast: Secondary | ICD-10-CM

## 2017-03-23 DIAGNOSIS — Z170 Estrogen receptor positive status [ER+]: Secondary | ICD-10-CM

## 2017-03-23 DIAGNOSIS — I890 Lymphedema, not elsewhere classified: Secondary | ICD-10-CM

## 2017-03-23 NOTE — Therapy (Addendum)
[place], [place], [address] Phone: [phone]   Fax:  [phone]  Physical Therapy Treatment  Patient Details  Name: Virginia [name] MRN: [id number] Date of Birth: 1980-07-28 Referring Provider: Thedore Mins, NP   Encounter Date: 03/23/2017  PT End of Session - 03/23/17 0934    Visit Number  4    Number of Visits  4    Date for PT Re-Evaluation  04/02/17    PT Start Time  0852    PT Stop Time  0934    PT Time Calculation (min)  42 min    Activity Tolerance  Patient tolerated treatment well    Behavior During Therapy  [facility] for tasks assessed/performed       Past Medical History:  Diagnosis Date  . Anemia    as a child  . Anxiety   . Cholestasis   . Elevated liver enzymes    during pregnancy March 2015  . Family history of breast cancer   . Family history of prostate cancer   . GERD (gastroesophageal reflux disease)    one episode  . Headache   . History of radiation therapy 06/11/16- 07/23/16   Left Breast 50 Gy in 25 fractions, Left Breast boost 10 Gy in 5 fractions.   . Hypothyroidism   . Malignant neoplasm of upper-inner quadrant of left female breast ([place]) 01/16/2016    Past Surgical History:  Procedure Laterality Date  . BREAST LUMPECTOMY WITH RADIOACTIVE SEED AND SENTINEL LYMPH NODE BIOPSY Left 03/13/2016   Procedure: LEFT BREAST LUMPECTOMY WITH RADIOACTIVE SEED X 2 AND SENTINEL LYMPH NODE BIOPSY;  Surgeon: Stark Klein, MD;  Location: [place];  Service: General;  Laterality: Left;  . NO PAST SURGERIES    . PORTACATH PLACEMENT Right 03/13/2016   Procedure: INSERTION PORT-A-CATH;  Surgeon: Stark Klein, MD;  Location: [place];  Service: General;  Laterality: Right;    There were no vitals filed for this visit.  Subjective Assessment - 03/23/17 0855    Subjective  Continuing with the self manual lymph drainage 2x/day and I think it's going really well. My Lt breast is feeling  softer. I think I'm ready to make today my last visit. I've noticed a little soreness under my breast but I think that's from doubling up on my sports bras.      Pertinent History  Status post left lumpectomy and sentinel lymph node sampling 03/13/2016 for an mpT1c pN0, stage IA invasive ductal carcinoma, grade 2, with negative margins. Completed XRT in May or June 2018; is on herceptin (to finish Feb. 15), zolodex and tamoxifen.  Otherwise healthy.    Patient Stated Goals  to reduce the swelling so I can be more comfortable when I wear a bra and when I sleep (I sleep on my left side, and when I do, it's more pronounced the next day)    Currently in Pain?  No/denies                      [facility] Adult PT Treatment/Exercise - 03/23/17 0001      Manual Therapy   Manual Therapy  Manual Lymphatic Drainage (MLD)    Manual Lymphatic Drainage (MLD)  In Supine: Short neck, superficial and deep abdominals, Lt inguinal and Rt axillary nodes, Lt axillo-inguinal and anterior inter-axillary anastomosis then focused on Lt breast; then into Rt S/L for continued Lt axillo-inguinal and posterior inter-axillary anastomosis reviewing with pt throughout  PT Long Term Goals - 03/23/17 0933      PT LONG TERM GOAL #1   Title  Pt. will be independent in self-manual lymph drainage.    Status  Achieved      PT LONG TERM GOAL #2   Title  Pt. will know where and how to obtain appropriate compression bras.    Status  Achieved            Plan - 03/23/17 0935    Clinical Impression Statement  Pt has done very well with therapy, becoming independent with self manual lymph drainage and having good palpable reductions of fluid in Lt breast. Issued script today as well for compression bra and number for [employer]. Pt plans to call them soon. She is ready for D/C.     Rehab Potential  Excellent    PT Frequency  1x / week    PT Duration  2 weeks    PT  Treatment/Interventions  ADLs/Self Care Home Management;Patient/family education;Manual techniques;Manual lymph drainage;Compression bandaging;Scar mobilization;Taping    PT Next Visit Plan  D/C this visit.     Consulted and Agree with Plan of Care  Patient       Patient will benefit from skilled therapeutic intervention in order to improve the following deficits and impairments:  Increased edema, Pain, Decreased knowledge of precautions, Decreased knowledge of use of DME  Visit Diagnosis: Lymphedema, not elsewhere classified  Carcinoma of upper-inner quadrant of left breast in female, estrogen receptor positive ([place])     Problem List Patient Active Problem List   Diagnosis Date Noted  . Port-A-Cath in place 02/27/2017  . Headache disorder 09/26/2016  . Genetic testing 02/26/2016  . Family history of breast cancer   . Family history of prostate cancer   . Malignant neoplasm of upper-inner quadrant of left breast in female, estrogen receptor positive ([place]) 01/16/2016  . Postpartum state 05/20/2013  . Cholelithiasis complicating pregnancy, antepartum 05/18/2013  . Cholestasis of pregnancy 04/28/2013    Virginia [name], PTA 03/23/2017, 9:37 AM  [place], [place], [address] Phone: [phone]   Fax:  [phone]  Name: Virginia [name] MRN: [id number] Date of Birth: 16-Oct-1980  PHYSICAL THERAPY DISCHARGE SUMMARY  Visits from Start of Care: 4  Current functional level related to goals / functional outcomes: Goals met as noted above.   Remaining deficits: Needs to get compression bra.   Education / Equipment: Self-manual lymph drainage and management of swelling.  Plan: Patient agrees to discharge.  Patient goals were partially met. Patient is being discharged due to meeting the stated rehab goals.  ?????    Serafina Royals, PT 04/09/17 3:19 PM

## 2017-04-09 ENCOUNTER — Encounter (HOSPITAL_COMMUNITY)
Admission: RE | Admit: 2017-04-09 | Discharge: 2017-04-09 | Disposition: A | Discharge status: Home / Self Care | Payer: PRIVATE HEALTH INSURANCE | Source: Ambulatory Visit | Attending: General Surgery | Admitting: General Surgery

## 2017-04-09 ENCOUNTER — Encounter (HOSPITAL_COMMUNITY): Payer: Self-pay

## 2017-04-09 ENCOUNTER — Other Ambulatory Visit: Payer: Self-pay

## 2017-04-09 DIAGNOSIS — Z9221 Personal history of antineoplastic chemotherapy: Secondary | ICD-10-CM | POA: Insufficient documentation

## 2017-04-09 DIAGNOSIS — Z01812 Encounter for preprocedural laboratory examination: Secondary | ICD-10-CM | POA: Insufficient documentation

## 2017-04-09 DIAGNOSIS — Z95828 Presence of other vascular implants and grafts: Secondary | ICD-10-CM | POA: Diagnosis not present

## 2017-04-09 DIAGNOSIS — Z853 Personal history of malignant neoplasm of breast: Secondary | ICD-10-CM | POA: Diagnosis not present

## 2017-04-09 HISTORY — DX: Lymphedema, not elsewhere classified: I89.0

## 2017-04-09 LAB — CBC
HCT: 40.9 % (ref 36.0–46.0)
Hemoglobin: 13 g/dL (ref 12.0–15.0)
MCH: 27.4 pg (ref 26.0–34.0)
MCHC: 31.8 g/dL (ref 30.0–36.0)
MCV: 86.1 fL (ref 78.0–100.0)
Platelets: 179 10*3/uL (ref 150–400)
RBC: 4.75 MIL/uL (ref 3.87–5.11)
RDW: 13.6 % (ref 11.5–15.5)
WBC: 5.3 10*3/uL (ref 4.0–10.5)

## 2017-04-09 LAB — BASIC METABOLIC PANEL
Anion gap: 9 (ref 5–15)
BUN: 12 mg/dL (ref 6–20)
CO2: 26 mmol/L (ref 22–32)
Calcium: 9.5 mg/dL (ref 8.9–10.3)
Chloride: 104 mmol/L (ref 101–111)
Creatinine, Ser: 0.87 mg/dL (ref 0.44–1.00)
GFR calc Af Amer: >60 mL/min (ref >60)
GFR calc non Af Amer: >60 mL/min (ref >60)
Glucose, Bld: 104 mg/dL — ABNORMAL HIGH (ref 65–99)
Potassium: 4.4 mmol/L (ref 3.5–5.1)
Sodium: 139 mmol/L (ref 135–145)

## 2017-04-09 NOTE — Pre-Procedure Instructions (Signed)
Virginia [name]  04/09/2017      [id number] - [place], [place] - [address] [place] [address] Phone: [phone] Fax: [phone]  [place], [place] [address] Phone: [phone] Fax: [phone]    Your procedure is scheduled on Thursday, Feb. 21st   Report to [facility] Admitting at  5:30 AM             (posted surgery time 7:30a - 8:30a)   Call this number if you have problems the morning of surgery:  [phone]   Remember:              4-5 days prior to surgery, STOP TAKING any Vitamins, Herbal Supplements, Anti-inflammatories.   Do not eat food or drink liquids after midnight Wednesday.   Take these medicines the morning of surgery with A SIP OF WATER : Tamoxifen   Do not wear jewelry, make-up or nail polish.  Do not wear lotions, powders,  perfumes, or deodorant.  Do not shave 48 hours prior to surgery.    Do not bring valuables to the hospital.  [facility] is not responsible for any belongings or valuables.  Contacts, dentures or bridgework may not be worn into surgery.  Leave your suitcase in the car.  After surgery it may be brought to your room.  Patients discharged the day of surgery will not be allowed to drive home, and will need someone to stay with you for the first 24 hrs.  Please read over the following fact sheets that you were given. Pain Booklet and Surgical Site Infection Prevention       [place]- Preparing For Surgery  Before surgery, you can play an important role. Because skin is not sterile, your skin needs to be as free of germs as possible. You can reduce the number of germs on your skin by washing with CHG (chlorahexidine gluconate) Soap before surgery.  CHG is an antiseptic cleaner which kills germs and bonds with the skin to continue killing germs even after washing.  Please do not  use if you have an allergy to CHG or antibacterial soaps. If your skin becomes reddened/irritated stop using the CHG.  Do not shave (including legs and underarms) for at least 48 hours prior to first CHG shower. It is OK to shave your face.  Please follow these instructions carefully.   1. Shower the NIGHT BEFORE SURGERY and the MORNING OF SURGERY with CHG.   2. If you chose to wash your hair, wash your hair first as usual with your normal shampoo.  3. After you shampoo, rinse your hair and body thoroughly to remove the shampoo.  4. Use CHG as you would any other liquid soap. You can apply CHG directly to the skin and wash gently with a scrungie or a clean washcloth.   5. Apply the CHG Soap to your body ONLY FROM THE NECK DOWN.  Do not use on open wounds or open sores. Avoid contact with your eyes, ears, mouth and genitals (private parts). Wash Face and genitals (private parts)  with your normal soap.  6. Wash thoroughly, paying special attention to the area where your surgery will be performed.  7. Thoroughly rinse your body with warm water from the neck down.  8. DO NOT shower/wash with your normal soap after using and rinsing off the CHG Soap.  9.  Pat yourself dry with a CLEAN TOWEL.  10. Wear CLEAN PAJAMAS to bed the night before surgery, wear comfortable clothes the morning of surgery  11. Place CLEAN SHEETS on your bed the night of your first shower and DO NOT SLEEP WITH PETS.    Day of Surgery: Do not apply any deodorants/lotions. Please wear clean clothes to the hospital/surgery center.

## 2017-04-09 NOTE — Progress Notes (Signed)
[facility]  [phone] [phone]     ID: GEET HOSKING DOB: Dec 14, 1980  MR#: [id number]  KWI#:[id number]  Patient Care Team: [name], MD as PCP - General (Family Medicine) Stark Klein, MD as Consulting Physician (General Surgery) Tyasia Packard, Virgie Dad, MD as Consulting Physician (Oncology) Eppie Gibson, MD as Attending Physician (Radiation Oncology) Gardenia Phlegm, NP as Nurse Practitioner (Hematology and Oncology) Shawnie Dapper OTHER MD:  CHIEF COMPLAINT:  Estrogen receptor positive breast cancer  CURRENT TREATMENT: Completing one year of anti-HER-2 immunotherapy, continuing goserelin, tamoxifen  BREAST CANCER HISTORY: From the original intake note:  Emmalena noted some itching on her breasts and since this was the symptom that preceded her grandmother's breast cancer she underwent baseline bilateral screening mammography at [facility] 01/10/2016. The breast density was category B. In the left breast central to the nipple there was a 1.7 cm irregular high density mass with pleomorphic calcifications. Accordingly the patient was brought back 01/14/2016 for left diagnostic mammography and ultrasonography. This confirmed a 0.8 cm irregular mass in the left breast at the 10:00 position. 0.5 cm from the nipple lateral and posterior to that there was a 0.2 cm cluster of amorphous calcifications with an associated density. Anterior to the index mass was a 0.5 cm cluster of amorphous calcifications. All 3 findings taken together 3 cm. Ultrasound of the left breast found a 1 cm irregular mass in the left breast at the 10:00 position retroareolarly. There were no other sonographic abnormalities and the left axilla was sonographically benign  On 01/15/2016 the patient underwent core needle biopsy of the left breast index lesion at 10:00, and this found invasive ductal carcinoma, E-cadherin positive, estrogen receptor 90% positive, progesterone  receptor 40% positive, both with moderate staining intensity, with an MIB-1 of 5%, but HER-2 amplified, with a signals ratio of 2.41 and the number per cell 6.50. One of the 2 areas of abnormal calcifications, the more posterior one measuring 0.2 cm, was also biopsied and showed only atypical ductal hyperplasia (S [id number] and [id number]).  Her subsequent history is as detailed below  INTERVAL HISTORY: Jun returns today for follow-up and treatment of her triple positive breast cancer. She is receiving Trastuzumab every 21 days, with her final dose due today. She notes having headaches for about 2 days after treatment, but she hasn't had one over the last 2 treatments.  Her most recent echo was 01/14/2017, with an excellent ejection fraction.  She also receives goserelin every 3 months. She tolerates this well.  She also continues on tamoxifen, with good tolerance.  She denies issues with hot flashes. She notes that vaginal dryness is a minor issue.     REVIEW OF SYSTEMS: Debera reports that she hasn't exercised in 3 weeks because she was feeling tired. She notes that she goes to yoga offered through [facility]. She notes that the yoga helps clear her mind. She notes that she has been very busy and stressed due to work. She denies unusual headaches, visual changes, nausea, vomiting, or dizziness. There has been no unusual cough, phlegm production, or pleurisy. This been no change in bowel or bladder habits. She denies unexplained fatigue or unexplained weight loss, bleeding, rash, or fever. A detailed review of systems was otherwise stable.   PAST MEDICAL HISTORY: Past Medical History:  Diagnosis Date  . Anemia    as a child  . Anxiety   . Cholestasis   . Elevated liver enzymes  during pregnancy March 2015  . Family history of breast cancer   . Family history of prostate cancer   . GERD (gastroesophageal reflux disease)    one episode  . Headache   . History of radiation therapy  06/11/16- 07/23/16   Left Breast 50 Gy in 25 fractions, Left Breast boost 10 Gy in 5 fractions.   . Hypothyroidism   . Lymphedema of arm    LEFT  . Malignant neoplasm of upper-inner quadrant of left female breast ([place]) 01/16/2016    PAST SURGICAL HISTORY: Past Surgical History:  Procedure Laterality Date  . BREAST LUMPECTOMY WITH RADIOACTIVE SEED AND SENTINEL LYMPH NODE BIOPSY Left 03/13/2016   Procedure: LEFT BREAST LUMPECTOMY WITH RADIOACTIVE SEED X 2 AND SENTINEL LYMPH NODE BIOPSY;  Surgeon: Stark Klein, MD;  Location: [facility];  Service: General;  Laterality: Left;  . NO PAST SURGERIES    . PORTACATH PLACEMENT Right 03/13/2016   Procedure: INSERTION PORT-A-CATH;  Surgeon: Stark Klein, MD;  [facility];  Service: General;  Laterality: Right;    FAMILY HISTORY Family History  Problem Relation Age of Onset  . Hypertension Mother   . Hypertension Father   . Prostate cancer Father   . Liver cancer Maternal Grandmother   . Breast cancer Paternal Grandmother        dx in her 47s  . Lung cancer Paternal Aunt        smoker  . Heart attack Paternal Grandfather   . Liver cancer Other   The patient's parents are alive, in their mid 35s as of November 2017. The patient father was diagnosed with prostate cancer at age 68. A paternal aunt was diagnosed with lung cancer in her late 68s. Also the paternal grandmother was diagnosed with breast cancer at the age of 47. On the mother's side there are 2 cases of liver cancer, age of diagnosis unknown.  GYNECOLOGIC HISTORY:  No LMP recorded.  Menarche age 36, first live birth age 62, she is [place] P2. She still having periods, most recently 01/21/2016, but they are no longer regular. She remotely used a number ring and oral contraceptives but stopped in 2016.Currently there use barrier methods for contraception.   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 Her husband [name] works as a Printmaker for  [employer] (for example he worked on our (). Their children are [name] and [name],, aged 67 and 2 as of November 2017. Attention and is not a church attender    ADVANCED DIRECTIVES: Not in place   HEALTH MAINTENANCE: Social History   Tobacco Use  . Smoking status: Never Smoker  . Smokeless tobacco: Never Used  Substance Use Topics  . Alcohol use: No  . Drug use: No     Colonoscopy:  PAP:2016  Bone density:   Allergies  Allergen Reactions  . Amoxicillin Diarrhea and Nausea Only    Has patient had a PCN reaction causing immediate rash, facial/tongue/throat swelling, SOB or lightheadedness with hypotension: No Has patient had a PCN reaction causing severe rash involving mucus membranes or skin necrosis: No Has patient had a PCN reaction that required hospitalization No Has patient had a PCN reaction occurring within the last 10 years: Yes If all of the above answers are "NO", then may proceed with Cephalosporin use.     Current Outpatient Medications  Medication Sig Dispense Refill  . acetaminophen (TYLENOL) 500 MG tablet Take 500 mg by mouth  every 6 (six) hours as needed for headache.     . butalbital-acetaminophen-caffeine (FIORICET, ESGIC) 50-325-40 MG tablet Take 1 tablet by mouth every 6 (six) hours as needed for headache.    Marland Kitchen goserelin (ZOLADEX) 10.8 MG injection Inject 10.8 mg into the skin every 3 (three) months. She is not sure of the exact dose     . ibuprofen (ADVIL,MOTRIN) 200 MG tablet Take 400 mg by mouth every 8 (eight) hours as needed for mild pain.     Marland Kitchen lidocaine-prilocaine (EMLA) cream Apply 1 application topically as needed. (Patient taking differently: Apply 1 application topically as needed (port). ) 30 g 0  . Multiple Vitamins-Minerals (MULTIVITAMIN GUMMIES ADULT PO) Take 2 tablets by mouth daily.    . tamoxifen (NOLVADEX) 20 MG tablet Take 1 tablet (20 mg total) daily by mouth. 30 tablet 3   No current facility-administered medications  for this visit.     OBJECTIVE: Young African-American woman who appears well  Vitals:   04/10/17 1450  BP: 136/86  Pulse: 67  Resp: 18  Temp: 98 F (36.7 C)  SpO2: 100%     Body mass index is 31.07 kg/m.    ECOG FS:0 - Asymptomatic   .Sclerae unicteric, pupils round and equal Oropharynx clear and moist No cervical or supraclavicular adenopathy Lungs no rales or rhonchi Heart regular rate and rhythm Abd soft, nontender, positive bowel sounds MSK no focal spinal tenderness, no upper extremity lymphedema Neuro: nonfocal, well oriented, appropriate affect Breasts: The right breast is unremarkable.  The left breast is status post lumpectomy and radiation.  There is some residual hyperpigmentation.  There is some skin thickening as expected.  There is no evidence of local recurrence.  Both axillae are benign.    LAB RESULTS:  CMP     Component Value Date/Time   NA 139 04/09/2017 1529   NA 142 02/27/2017 1350   K 4.4 04/09/2017 1529   K 3.7 02/27/2017 1350   CL 104 04/09/2017 1529   CO2 26 04/09/2017 1529   CO2 28 02/27/2017 1350   GLUCOSE 104 (H) 04/09/2017 1529   GLUCOSE 124 02/27/2017 1350   BUN 12 04/09/2017 1529   BUN 8.9 02/27/2017 1350   CREATININE 0.87 04/09/2017 1529   CREATININE 0.8 02/27/2017 1350   CALCIUM 9.5 04/09/2017 1529   CALCIUM 9.5 02/27/2017 1350   PROT 7.5 03/20/2017 1450   PROT 7.3 02/27/2017 1350   ALBUMIN 3.9 03/20/2017 1450   ALBUMIN 3.8 02/27/2017 1350   AST 30 03/20/2017 1450   AST 21 02/27/2017 1350   ALT 48 03/20/2017 1450   ALT 25 02/27/2017 1350   ALKPHOS 96 03/20/2017 1450   ALKPHOS 82 02/27/2017 1350   BILITOT <0.2 (L) 03/20/2017 1450   BILITOT 0.22 02/27/2017 1350   GFRNONAA >60 04/09/2017 1529   GFRAA >60 04/09/2017 1529    INo results found for: SPEP, UPEP  Lab Results  Component Value Date   WBC 5.3 04/09/2017   NEUTROABS 1.4 (L) 03/20/2017   HGB 13.0 04/09/2017   HCT 40.9 04/09/2017   MCV 86.1 04/09/2017   PLT  179 04/09/2017      Chemistry      Component Value Date/Time   NA 139 04/09/2017 1529   NA 142 02/27/2017 1350   K 4.4 04/09/2017 1529   K 3.7 02/27/2017 1350   CL 104 04/09/2017 1529   CO2 26 04/09/2017 1529   CO2 28 02/27/2017 1350   BUN 12 04/09/2017 1529  BUN 8.9 02/27/2017 1350   CREATININE 0.87 04/09/2017 1529   CREATININE 0.8 02/27/2017 1350      Component Value Date/Time   CALCIUM 9.5 04/09/2017 1529   CALCIUM 9.5 02/27/2017 1350   ALKPHOS 96 03/20/2017 1450   ALKPHOS 82 02/27/2017 1350   AST 30 03/20/2017 1450   AST 21 02/27/2017 1350   ALT 48 03/20/2017 1450   ALT 25 02/27/2017 1350   BILITOT <0.2 (L) 03/20/2017 1450   BILITOT 0.22 02/27/2017 1350       No results found for: LABCA2  No components found for: LABCA125  No results for input(s): INR in the last 168 hours.  Urinalysis    Component Value Date/Time   COLORURINE YELLOW 10/14/2012 1954   APPEARANCEUR CLEAR 10/14/2012 1954   LABSPEC >1.030 (H) 10/14/2012 1954   PHURINE 6.0 10/14/2012 1954   GLUCOSEU NEGATIVE 10/14/2012 1954   HGBUR TRACE (A) 10/14/2012 1954   BILIRUBINUR NEGATIVE 10/14/2012 1954   KETONESUR 15 (A) 10/14/2012 1954   PROTEINUR NEGATIVE 10/14/2012 1954   UROBILINOGEN 0.2 10/14/2012 1954   NITRITE NEGATIVE 10/14/2012 1954   LEUKOCYTESUR NEGATIVE 10/14/2012 1954     STUDIES:  Most recent mammography was October 2018  ELIGIBLE FOR AVAILABLE RESEARCH PROTOCOL: no  ASSESSMENT: 37 y.o. [place] woman status post left breast upper inner quadrant biopsy 01/15/2016 for a clinical T1c N0, stage IA invasive ductal carcinoma, E-cadherin positive, estrogen receptor 90% positive, progesterone receptor 40% positive, both with moderate staining intensity, with an MIB-1 of 5%, but HER-2 amplified, with a signals ratio of 2.41 and the number per cell 6.50.  (a) One of 2 areas of abnormal calcifications, the more posterior one measuring 0.2 cm, was also biopsied and showed only atypical  ductal hyperplasia  (b) a 4.1 cm area of non-masslike enhancement in the left breast underwent biopsy 02/19/2016 showing only fibrocystic changes  (1) tamoxifen started 01/23/2016 neoadjuvantly  (2) genetics testing 02/22/2016 through the Breast/Ovarian gene panel offered by [facility] found no deleterious mutations in  ATM, BARD1, BRCA1, BRCA2, BRIP1, CDH1, CHEK2, EPCAM, FANCC, MLH1, MSH2, MSH6, NBN, PALB2, PMS2, PTEN, RAD51C, RAD51D, TP53, and XRCC2.     (3) status post left lumpectomy and sentinel lymph node sampling 03/13/2016 for an mpT1c pN0, stage IA invasive ductal carcinoma, grade 2, with negative margins.  (4) paclitaxel weekly 12 and trastuzumab every 21 days beginning on 04/10/2016.  (a) paclitaxel discontinued after 5 doses because of atypical neuropathy symptoms. Last dose 04/28/2016  (5) trastuzumab continued to complete a year--last dose 04/10/2017  (a) echocardiogram 02/11/2016 shows ejection fraction in the 60-65% range.  (b) echo 05/28/2916 shows EF in the 65-70% range  (c) echo on 09/26/2016 shows LVEF of 55-60%  (d) echo on 01/14/2017 shows EF in the 60-65%  (6) adjuvant radiation 06/11/16 - 07/23/16 Site/dose:    1) Left Breast / 50 Gy in 25 fractions 2) Left Breast Boost / 10 Gy in 5 fractions  (7) tamoxifen resumed to 07/25/2016  (8) patient resumed menses post chemo, April 2018  (a) goserelin started 06/17/2016, initially given every 28 days, however changed to every 12 weeks on 11/12/16 with estradiol monitoring.  PLAN:  Domingue completes her year of Herceptin today.  That is a real landmark.  She will have her port removed next week--that has already been scheduled.  I suggest that she give herself some kind of reward this weekend.  She is tolerating the tamoxifen well.  She also receives goserelin now every 3 months.  We  are following her estradiol and it was down to 17.22 months ago when last checked.  She is currently not exercising regularly.  She is very  conscientious about work and she also has family to take care of and this makes things difficult.  I commended her however for belonging to a gym and coming here for yoga exercises.  I think she would benefit from referral to the pelvic health program and I have written that order for her  She will see me again in 3 months I will probably start seeing her every 6 months for the next year after that visit  She knows to call for any other problems that may develop before then.    Jonette Wassel, Virgie Dad, MD  04/10/17 3:04 PM Medical Oncology and Hematology [facility] [address] [place],  [address] Tel. [phone]    Fax. [phone]  This document serves as a record of services personally performed by Lurline Del, MD. It was created on his behalf by Sheron Nightingale, a trained medical scribe. The creation of this record is based on the scribe's personal observations and the provider's statements to them.   I have reviewed the above documentation for accuracy and completeness, and I agree with the above.

## 2017-04-09 NOTE — Progress Notes (Signed)
PCP is Dr. Ulanda Edison  lOV 02/2017 Oncologist is Dr. Jana Hakim----------------------------------------------------------------------------------------------------------------------------------------------------------------------------------------------------------------------------------------------------------------------------------------------- She gets an echo done every 4 mths d/t chemo Denies murmur, cp, sob. 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

## 2017-04-10 ENCOUNTER — Inpatient Hospital Stay: Payer: PRIVATE HEALTH INSURANCE

## 2017-04-10 ENCOUNTER — Telehealth: Payer: Self-pay | Admitting: Oncology

## 2017-04-10 ENCOUNTER — Inpatient Hospital Stay: Payer: PRIVATE HEALTH INSURANCE | Attending: Adult Health

## 2017-04-10 ENCOUNTER — Inpatient Hospital Stay (HOSPITAL_BASED_OUTPATIENT_CLINIC_OR_DEPARTMENT_OTHER): Payer: PRIVATE HEALTH INSURANCE | Admitting: Oncology

## 2017-04-10 VITALS — BP 136/86 | HR 67 | Temp 98.0°F | Resp 18 | Ht 67.0 in | Wt 198.4 lb

## 2017-04-10 DIAGNOSIS — C50212 Malignant neoplasm of upper-inner quadrant of left female breast: Secondary | ICD-10-CM | POA: Diagnosis not present

## 2017-04-10 DIAGNOSIS — Z79899 Other long term (current) drug therapy: Secondary | ICD-10-CM

## 2017-04-10 DIAGNOSIS — I714 Abdominal aortic aneurysm, without rupture: Secondary | ICD-10-CM | POA: Insufficient documentation

## 2017-04-10 DIAGNOSIS — Z79810 Long term (current) use of selective estrogen receptor modulators (SERMs): Secondary | ICD-10-CM | POA: Insufficient documentation

## 2017-04-10 DIAGNOSIS — G629 Polyneuropathy, unspecified: Secondary | ICD-10-CM | POA: Insufficient documentation

## 2017-04-10 DIAGNOSIS — Z170 Estrogen receptor positive status [ER+]: Secondary | ICD-10-CM

## 2017-04-10 DIAGNOSIS — Z5112 Encounter for antineoplastic immunotherapy: Secondary | ICD-10-CM | POA: Diagnosis present

## 2017-04-10 DIAGNOSIS — Z803 Family history of malignant neoplasm of breast: Secondary | ICD-10-CM | POA: Insufficient documentation

## 2017-04-10 DIAGNOSIS — F419 Anxiety disorder, unspecified: Secondary | ICD-10-CM | POA: Diagnosis not present

## 2017-04-10 DIAGNOSIS — Z95828 Presence of other vascular implants and grafts: Secondary | ICD-10-CM

## 2017-04-10 LAB — CMP (CANCER CENTER ONLY)
ALT: 20 U/L (ref 0–55)
AST: 21 U/L (ref 5–34)
Albumin: 3.9 g/dL (ref 3.5–5.0)
Alkaline Phosphatase: 92 U/L (ref 40–150)
Anion gap: 9 (ref 3–11)
BUN: 13 mg/dL (ref 7–26)
CO2: 27 mmol/L (ref 22–29)
Calcium: 9.7 mg/dL (ref 8.4–10.4)
Chloride: 105 mmol/L (ref 98–109)
Creatinine: 0.85 mg/dL (ref 0.60–1.10)
GFR, Est AFR Am: >60 mL/min (ref >60)
GFR, Estimated: >60 mL/min (ref >60)
Glucose, Bld: 95 mg/dL (ref 70–140)
Potassium: 3.7 mmol/L (ref 3.5–5.1)
Sodium: 141 mmol/L (ref 136–145)
Total Bilirubin: <0.2 mg/dL — ABNORMAL LOW (ref 0.2–1.2)
Total Protein: 7.4 g/dL (ref 6.4–8.3)

## 2017-04-10 LAB — CBC WITH DIFFERENTIAL (CANCER CENTER ONLY)
Basophils Absolute: 0 10*3/uL (ref 0.0–0.1)
Basophils Relative: 1 %
Eosinophils Absolute: 0.1 10*3/uL (ref 0.0–0.5)
Eosinophils Relative: 2 %
HCT: 38.8 % (ref 34.8–46.6)
Hemoglobin: 12.4 g/dL (ref 11.6–15.9)
Lymphocytes Relative: 36 %
Lymphs Abs: 1.4 10*3/uL (ref 0.9–3.3)
MCH: 26.8 pg (ref 25.1–34.0)
MCHC: 32 g/dL (ref 31.5–36.0)
MCV: 83.8 fL (ref 79.5–101.0)
Monocytes Absolute: 0.5 10*3/uL (ref 0.1–0.9)
Monocytes Relative: 13 %
Neutro Abs: 1.9 10*3/uL (ref 1.5–6.5)
Neutrophils Relative %: 48 %
Platelet Count: 167 10*3/uL (ref 145–400)
RBC: 4.63 MIL/uL (ref 3.70–5.45)
RDW: 13.4 % (ref 11.2–14.5)
WBC Count: 4 10*3/uL (ref 3.9–10.3)

## 2017-04-10 MED ORDER — SODIUM CHLORIDE 0.9% FLUSH
10.0000 mL | INTRAVENOUS | Status: DC | PRN
  Administered 2017-04-10: 10 mL
  Filled 2017-04-10: qty 10

## 2017-04-10 MED ORDER — DIPHENHYDRAMINE HCL 25 MG PO CAPS
ORAL_CAPSULE | ORAL | Status: AC
Start: 2017-04-10 — End: 2017-04-10
  Filled 2017-04-10: qty 2

## 2017-04-10 MED ORDER — TRASTUZUMAB CHEMO 150 MG IV SOLR
6.0000 mg/kg | Freq: Once | INTRAVENOUS | Status: AC
  Administered 2017-04-10: 504 mg via INTRAVENOUS
  Filled 2017-04-10: qty 24

## 2017-04-10 MED ORDER — SODIUM CHLORIDE 0.9 % IV SOLN
Freq: Once | INTRAVENOUS | Status: AC
  Administered 2017-04-10: 16:00:00 via INTRAVENOUS

## 2017-04-10 MED ORDER — DIPHENHYDRAMINE HCL 25 MG PO CAPS
25.0000 mg | ORAL_CAPSULE | Freq: Once | ORAL | Status: AC
  Administered 2017-04-10: 25 mg via ORAL

## 2017-04-10 MED ORDER — SODIUM CHLORIDE 0.9% FLUSH
10.0000 mL | Freq: Once | INTRAVENOUS | Status: AC
  Administered 2017-04-10: 10 mL
  Filled 2017-04-10: qty 10

## 2017-04-10 MED ORDER — ACETAMINOPHEN 325 MG PO TABS
ORAL_TABLET | ORAL | Status: AC
  Filled 2017-04-10: qty 2

## 2017-04-10 MED ORDER — HEPARIN SOD (PORK) LOCK FLUSH 100 UNIT/ML IV SOLN
500.0000 [IU] | Freq: Once | INTRAVENOUS | Status: AC | PRN
  Administered 2017-04-10: 500 [IU]
  Filled 2017-04-10: qty 5

## 2017-04-10 MED ORDER — ACETAMINOPHEN 325 MG PO TABS
650.0000 mg | ORAL_TABLET | Freq: Once | ORAL | Status: AC
  Administered 2017-04-10: 650 mg via ORAL

## 2017-04-10 NOTE — Patient Instructions (Signed)
Implanted Port Home Guide An implanted port is a type of central line that is placed under the skin. Central lines are used to provide IV access when treatment or nutrition needs to be given through a person's veins. Implanted ports are used for long-term IV access. An implanted port may be placed because:  You need IV medicine that would be irritating to the small veins in your hands or arms.  You need long-term IV medicines, such as antibiotics.  You need IV nutrition for a long period.  You need frequent blood draws for lab tests.  You need dialysis.  Implanted ports are usually placed in the chest area, but they can also be placed in the upper arm, the abdomen, or the leg. An implanted port has two main parts:  Reservoir. The reservoir is round and will appear as a small, raised area under your skin. The reservoir is the part where a needle is inserted to give medicines or draw blood.  Catheter. The catheter is a thin, flexible tube that extends from the reservoir. The catheter is placed into a large vein. Medicine that is inserted into the reservoir goes into the catheter and then into the vein.  How will I care for my incision site? Do not get the incision site wet. Bathe or shower as directed by your health care provider. How is my port accessed? Special steps must be taken to access the port:  Before the port is accessed, a numbing cream can be placed on the skin. This helps numb the skin over the port site.  Your health care provider uses a sterile technique to access the port. ? Your health care provider must put on a mask and sterile gloves. ? The skin over your port is cleaned carefully with an antiseptic and allowed to dry. ? The port is gently pinched between sterile gloves, and a needle is inserted into the port.  Only "non-coring" port needles should be used to access the port. Once the port is accessed, a blood return should be checked. This helps ensure that the port  is in the vein and is not clogged.  If your port needs to remain accessed for a constant infusion, a clear (transparent) bandage will be placed over the needle site. The bandage and needle will need to be changed every week, or as directed by your health care provider.  Keep the bandage covering the needle clean and dry. Do not get it wet. Follow your health care provider's instructions on how to take a shower or bath while the port is accessed.  If your port does not need to stay accessed, no bandage is needed over the port.  What is flushing? Flushing helps keep the port from getting clogged. Follow your health care provider's instructions on how and when to flush the port. Ports are usually flushed with saline solution or a medicine called heparin. The need for flushing will depend on how the port is used.  If the port is used for intermittent medicines or blood draws, the port will need to be flushed: ? After medicines have been given. ? After blood has been drawn. ? As part of routine maintenance.  If a constant infusion is running, the port may not need to be flushed.  How long will my port stay implanted? The port can stay in for as long as your health care provider thinks it is needed. When it is time for the port to come out, surgery will be   done to remove it. The procedure is similar to the one performed when the port was put in. When should I seek immediate medical care? When you have an implanted port, you should seek immediate medical care if:  You notice a bad smell coming from the incision site.  You have swelling, redness, or drainage at the incision site.  You have more swelling or pain at the port site or the surrounding area.  You have a fever that is not controlled with medicine.  This information is not intended to replace advice given to you by your health care provider. Make sure you discuss any questions you have with your health care provider. Document  Released: 02/10/2005 Document Revised: 07/19/2015 Document Reviewed: 10/18/2012 Elsevier Interactive Patient Education  2017 [employer]  

## 2017-04-10 NOTE — Telephone Encounter (Signed)
Patient declined AVS and calendar of upcoming may appointments. Will receive update in mychart.

## 2017-04-10 NOTE — Patient Instructions (Signed)
[facility] Discharge Instructions for Patients Receiving Chemotherapy Today you received the following chemotherapy agents:  Herceptin To help prevent nausea and vomiting after your treatment, we encourage you to take your nausea medication as prescribed.   If you develop nausea and vomiting that is not controlled by your nausea medication, call the clinic.   BELOW ARE SYMPTOMS THAT SHOULD BE REPORTED IMMEDIATELY:  *FEVER GREATER THAN 100.5 F  *CHILLS WITH OR WITHOUT FEVER  NAUSEA AND VOMITING THAT IS NOT CONTROLLED WITH YOUR NAUSEA MEDICATION  *UNUSUAL SHORTNESS OF BREATH  *UNUSUAL BRUISING OR BLEEDING  TENDERNESS IN MOUTH AND THROAT WITH OR WITHOUT PRESENCE OF ULCERS  *URINARY PROBLEMS  *BOWEL PROBLEMS  UNUSUAL RASH Items with * indicate a potential emergency and should be followed up as soon as possible.  Feel free to call the clinic should you have any questions or concerns. The clinic phone number is [phone].  Please show the CHEMO ALERT CARD at check-in to the Emergency Department and triage nurse.   

## 2017-04-15 NOTE — Anesthesia Preprocedure Evaluation (Addendum)
Anesthesia Evaluation  Patient identified by MRN, date of birth, ID band Patient awake    Reviewed: Allergy & Precautions, Patient's Chart, lab work & pertinent test results  History of Anesthesia Complications (+) PONV and history of anesthetic complications  Airway Mallampati: II  TM Distance: <3 FB Neck ROM: full    Dental  (+) Teeth Intact, Dental Advidsory Given   Pulmonary neg pulmonary ROS,    Pulmonary exam normal        Cardiovascular negative cardio ROS Normal cardiovascular exam     Neuro/Psych  Headaches, Anxiety    GI/Hepatic Neg liver ROS, GERD  Controlled,  Endo/Other  Hypothyroidism   Renal/GU negative Renal ROS     Musculoskeletal   Abdominal   Peds  Hematology   Anesthesia Other Findings   Reproductive/Obstetrics                           Anesthesia Physical  Anesthesia Plan  ASA: II  Anesthesia Plan: General   Post-op Pain Management:    Induction: Intravenous  PONV Risk Score and Plan: 2 and Ondansetron and Propofol infusion  Airway Management Planned: Natural Airway and Simple Face Mask  Additional Equipment:   Intra-op Plan:   Post-operative Plan:   Informed Consent: I have reviewed the patients History and Physical, chart, labs and discussed the procedure including the risks, benefits and alternatives for the proposed anesthesia with the patient or authorized representative who has indicated his/her understanding and acceptance.   Dental Advisory Given and Dental advisory given  Plan Discussed with: Anesthesiologist and CRNA  Anesthesia Plan Comments:       Anesthesia Quick Evaluation

## 2017-04-16 ENCOUNTER — Other Ambulatory Visit (HOSPITAL_COMMUNITY): Payer: PRIVATE HEALTH INSURANCE

## 2017-04-16 ENCOUNTER — Ambulatory Visit (HOSPITAL_COMMUNITY): Payer: PRIVATE HEALTH INSURANCE | Admitting: Anesthesiology

## 2017-04-16 ENCOUNTER — Encounter (HOSPITAL_COMMUNITY): Payer: PRIVATE HEALTH INSURANCE | Admitting: Internal Medicine

## 2017-04-16 ENCOUNTER — Ambulatory Visit (HOSPITAL_COMMUNITY)
Admission: RE | Admit: 2017-04-16 | Discharge: 2017-04-16 | Disposition: A | Discharge status: Home / Self Care | Payer: PRIVATE HEALTH INSURANCE | Source: Ambulatory Visit | Attending: General Surgery | Admitting: General Surgery

## 2017-04-16 ENCOUNTER — Encounter (HOSPITAL_COMMUNITY)
Admission: RE | Disposition: A | Discharge status: Home / Self Care | Payer: Self-pay | Source: Ambulatory Visit | Attending: General Surgery

## 2017-04-16 DIAGNOSIS — Z79899 Other long term (current) drug therapy: Secondary | ICD-10-CM | POA: Insufficient documentation

## 2017-04-16 DIAGNOSIS — F419 Anxiety disorder, unspecified: Secondary | ICD-10-CM | POA: Diagnosis not present

## 2017-04-16 DIAGNOSIS — Z853 Personal history of malignant neoplasm of breast: Secondary | ICD-10-CM | POA: Insufficient documentation

## 2017-04-16 DIAGNOSIS — Z880 Allergy status to penicillin: Secondary | ICD-10-CM | POA: Diagnosis not present

## 2017-04-16 DIAGNOSIS — Z452 Encounter for adjustment and management of vascular access device: Secondary | ICD-10-CM | POA: Diagnosis present

## 2017-04-16 HISTORY — PX: PORT-A-CATH REMOVAL: SHX5289

## 2017-04-16 LAB — POCT PREGNANCY, URINE: Preg Test, Ur: NEGATIVE

## 2017-04-16 SURGERY — REMOVAL PORT-A-CATH
Anesthesia: General | Site: Chest

## 2017-04-16 MED ORDER — MIDAZOLAM HCL 2 MG/2ML IJ SOLN
INTRAMUSCULAR | Status: AC
  Filled 2017-04-16: qty 2

## 2017-04-16 MED ORDER — LIDOCAINE HCL (PF) 1 % IJ SOLN
INTRAMUSCULAR | Status: AC
  Filled 2017-04-16: qty 30

## 2017-04-16 MED ORDER — FENTANYL CITRATE (PF) 100 MCG/2ML IJ SOLN: 25.0000 ug | INTRAMUSCULAR | Status: DC | PRN

## 2017-04-16 MED ORDER — CIPROFLOXACIN IN D5W 400 MG/200ML IV SOLN
400.0000 mg | INTRAVENOUS | Status: AC
  Administered 2017-04-16: 400 mg via INTRAVENOUS
  Filled 2017-04-16: qty 200

## 2017-04-16 MED ORDER — GABAPENTIN 300 MG PO CAPS
300.0000 mg | ORAL_CAPSULE | ORAL | Status: AC
  Administered 2017-04-16: 300 mg via ORAL
  Filled 2017-04-16: qty 1

## 2017-04-16 MED ORDER — ACETAMINOPHEN 500 MG PO TABS
1000.0000 mg | ORAL_TABLET | ORAL | Status: AC
  Administered 2017-04-16: 1000 mg via ORAL
  Filled 2017-04-16: qty 2

## 2017-04-16 MED ORDER — PROPOFOL 10 MG/ML IV BOLUS
INTRAVENOUS | Status: AC
  Filled 2017-04-16: qty 20

## 2017-04-16 MED ORDER — CHLORHEXIDINE GLUCONATE CLOTH 2 % EX PADS: 6.0000 | MEDICATED_PAD | Freq: Once | CUTANEOUS | Status: DC

## 2017-04-16 MED ORDER — PROPOFOL 500 MG/50ML IV EMUL
INTRAVENOUS | Status: DC | PRN
  Administered 2017-04-16: 75 ug/kg/min via INTRAVENOUS

## 2017-04-16 MED ORDER — FENTANYL CITRATE (PF) 100 MCG/2ML IJ SOLN
INTRAMUSCULAR | Status: DC | PRN
  Administered 2017-04-16: 50 ug via INTRAVENOUS
  Administered 2017-04-16: 25 ug via INTRAVENOUS

## 2017-04-16 MED ORDER — LIDOCAINE HCL 1 % IJ SOLN
INTRAMUSCULAR | Status: DC | PRN
  Administered 2017-04-16: 11 mL

## 2017-04-16 MED ORDER — 0.9 % SODIUM CHLORIDE (POUR BTL) OPTIME
TOPICAL | Status: DC | PRN
  Administered 2017-04-16: 1000 mL

## 2017-04-16 MED ORDER — FENTANYL CITRATE (PF) 250 MCG/5ML IJ SOLN
INTRAMUSCULAR | Status: AC
  Filled 2017-04-16: qty 5

## 2017-04-16 MED ORDER — MIDAZOLAM HCL 5 MG/5ML IJ SOLN
INTRAMUSCULAR | Status: DC | PRN
  Administered 2017-04-16: 2 mg via INTRAVENOUS

## 2017-04-16 MED ORDER — PROMETHAZINE HCL 25 MG/ML IJ SOLN: 6.2500 mg | INTRAMUSCULAR | Status: DC | PRN

## 2017-04-16 MED ORDER — BUPIVACAINE-EPINEPHRINE (PF) 0.5% -1:200000 IJ SOLN
INTRAMUSCULAR | Status: AC
  Filled 2017-04-16: qty 30

## 2017-04-16 MED ORDER — LACTATED RINGERS IV SOLN
INTRAVENOUS | Status: DC | PRN
  Administered 2017-04-16: 07:00:00 via INTRAVENOUS

## 2017-04-16 SURGICAL SUPPLY — 31 items
BLADE SURG 15 STRL LF DISP TIS (BLADE) ×1 IMPLANT
BLADE SURG 15 STRL SS (BLADE) ×1
CHLORAPREP W/TINT 10.5 ML (MISCELLANEOUS) ×2 IMPLANT
COVER SURGICAL LIGHT HANDLE (MISCELLANEOUS) ×2 IMPLANT
DECANTER SPIKE VIAL GLASS SM (MISCELLANEOUS) ×4 IMPLANT
DERMABOND ADVANCED (GAUZE/BANDAGES/DRESSINGS) ×1
DERMABOND ADVANCED .7 DNX12 (GAUZE/BANDAGES/DRESSINGS) ×1 IMPLANT
DRAPE LAPAROTOMY 100X72 PEDS (DRAPES) ×2 IMPLANT
DRAPE UTILITY XL STRL (DRAPES) ×2 IMPLANT
ELECT CAUTERY BLADE 6.4 (BLADE) ×2 IMPLANT
ELECT REM PT RETURN 9FT ADLT (ELECTROSURGICAL) ×2
ELECTRODE REM PT RTRN 9FT ADLT (ELECTROSURGICAL) ×1 IMPLANT
GAUZE SPONGE 4X4 16PLY XRAY LF (GAUZE/BANDAGES/DRESSINGS) ×2 IMPLANT
GLOVE BIO SURGEON STRL SZ 6 (GLOVE) ×2 IMPLANT
GLOVE INDICATOR 6.5 STRL GRN (GLOVE) ×2 IMPLANT
GOWN STRL REUS W/ TWL LRG LVL3 (GOWN DISPOSABLE) ×1 IMPLANT
GOWN STRL REUS W/TWL 2XL LVL3 (GOWN DISPOSABLE) ×2 IMPLANT
GOWN STRL REUS W/TWL LRG LVL3 (GOWN DISPOSABLE) ×1
KIT BASIN OR (CUSTOM PROCEDURE TRAY) ×2 IMPLANT
KIT ROOM TURNOVER OR (KITS) ×2 IMPLANT
NEEDLE HYPO 25GX1X1/2 BEV (NEEDLE) ×2 IMPLANT
NS IRRIG 1000ML POUR BTL (IV SOLUTION) ×2 IMPLANT
PACK SURGICAL SETUP 50X90 (CUSTOM PROCEDURE TRAY) ×2 IMPLANT
PAD ARMBOARD 7.5X6 YLW CONV (MISCELLANEOUS) ×4 IMPLANT
PENCIL BUTTON HOLSTER BLD 10FT (ELECTRODE) ×2 IMPLANT
SUT MON AB 4-0 PC3 18 (SUTURE) ×2 IMPLANT
SUT VIC AB 3-0 SH 27 (SUTURE) ×1
SUT VIC AB 3-0 SH 27X BRD (SUTURE) ×1 IMPLANT
SYR CONTROL 10ML LL (SYRINGE) ×2 IMPLANT
TOWEL OR 17X24 6PK STRL BLUE (TOWEL DISPOSABLE) ×2 IMPLANT
TOWEL OR 17X26 10 PK STRL BLUE (TOWEL DISPOSABLE) IMPLANT

## 2017-04-16 NOTE — Op Note (Signed)
  PRE-OPERATIVE DIAGNOSIS:  un-needed Port-A-Cath for left breast cancer  POST-OPERATIVE DIAGNOSIS:  Same   PROCEDURE:  Procedure(s):  REMOVAL RIGHT SUBCLAVIAN PORT-A-CATH  SURGEON:  Surgeon(s):  Stark Klein, MD  ANESTHESIA:   MAC + local  EBL:   Minimal  SPECIMEN:  None  Complications : none known  Procedure:   Pt was  identified in the holding area and taken to the operating room where she was placed supine on the operating room table.  MAC anesthesia was induced.  The right upper chest was prepped and draped.  The prior incision was anesthetized with local anesthetic.  The incision was opened with a #15 blade.  The subcutaneous tissue was divided with the cautery.  The port was identified and the capsule opened.  The four 2-0 prolene sutures were removed.  The port was then removed and pressure held on the tract.  The catheter appeared intact without evidence of breakage, length was 18 cm.  The wound was inspected for hemostasis, which was achieved with cautery.  The wound was closed with 3-0 vicryl deep dermal interrupted sutures and 4-0 Monocryl running subcuticular suture.  The wound was cleaned, dried, and dressed with dermabond.  The patient was awakened from anesthesia and taken to the PACU in stable condition.  Needle, sponge, and instrument counts are correct.

## 2017-04-16 NOTE — H&P (Signed)
Virginia [name] is an 37 y.o. female.   Chief Complaint: left breast cancer HPI:  Pt is a 37 yo f who is s/p completion of chemotherapy for left breast cancer. She desires port removal.  Past Medical History:  Diagnosis Date  . Anemia    as a child  . Anxiety   . Cholestasis   . Elevated liver enzymes    during pregnancy March 2015  . Family history of breast cancer   . Family history of prostate cancer   . GERD (gastroesophageal reflux disease)    one episode  . Headache   . History of radiation therapy 06/11/16- 07/23/16   Left Breast 50 Gy in 25 fractions, Left Breast boost 10 Gy in 5 fractions.   . Hypothyroidism   . Lymphedema of arm    LEFT  . 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 BREAST LUMPECTOMY WITH RADIOACTIVE SEED AND SENTINEL LYMPH NODE BIOPSY Left 03/13/2016   Procedure: LEFT BREAST LUMPECTOMY WITH RADIOACTIVE SEED X 2 AND SENTINEL LYMPH NODE BIOPSY;  Surgeon: Stark Klein, MD;  Location: [place];  Service: General;  Laterality: Left;  . NO PAST SURGERIES    . PORTACATH PLACEMENT Right 03/13/2016   Procedure: INSERTION PORT-A-CATH;  Surgeon: Stark Klein, MD;  Location: [facility] OR;  Service: General;  Laterality: Right;    Family History  Problem Relation Age of Onset  . Hypertension Mother   . Hypertension Father   . Prostate cancer Father   . Liver cancer Maternal Grandmother   . Breast cancer Paternal Grandmother        dx in her 44s  . Lung cancer Paternal Aunt        smoker  . Heart attack Paternal Grandfather   . Liver cancer Other    Social History:  reports that  has never smoked. she has never used smokeless tobacco. She reports that she does not drink alcohol or use drugs.  Allergies:  Allergies  Allergen Reactions  . Amoxicillin Diarrhea and Nausea Only    Has patient had a PCN reaction causing immediate rash, facial/tongue/throat swelling, SOB or  lightheadedness with hypotension: No Has patient had a PCN reaction causing severe rash involving mucus membranes or skin necrosis: No Has patient had a PCN reaction that required hospitalization No Has patient had a PCN reaction occurring within the last 10 years: Yes If all of the above answers are "NO", then may proceed with Cephalosporin use.     Medications Prior to Admission  Medication Sig Dispense Refill  . acetaminophen (TYLENOL) 500 MG tablet Take 500 mg by mouth every 6 (six) hours as needed for headache.     Marland Kitchen goserelin (ZOLADEX) 10.8 MG injection Inject 10.8 mg into the skin every 3 (three) months. She is not sure of the exact dose     . ibuprofen (ADVIL,MOTRIN) 200 MG tablet Take 400 mg by mouth every 8 (eight) hours as needed for mild pain.     Marland Kitchen lidocaine-prilocaine (EMLA) cream Apply 1 application topically as needed. (Patient taking differently: Apply 1 application topically as needed (port). ) 30 g 0  . Multiple Vitamins-Minerals (MULTIVITAMIN GUMMIES ADULT PO) Take 2 tablets by mouth daily.    . tamoxifen (NOLVADEX) 20 MG tablet Take 1 tablet (20 mg total) daily by mouth. 30 tablet 3  . butalbital-acetaminophen-caffeine (FIORICET, ESGIC) 50-325-40 MG tablet Take 1 tablet by mouth every  6 (six) hours as needed for headache.      Results for orders placed or performed during the hospital encounter of 04/16/17 (from the past 48 hour(s))  Pregnancy, urine POC     Status: None   Collection Time: 04/16/17  6:24 AM  Result Value Ref Range   Preg Test, Ur NEGATIVE NEGATIVE    Comment:        THE SENSITIVITY OF THIS METHODOLOGY IS >24 mIU/mL    No results found.  Review of Systems  All other systems reviewed and are negative.   Blood pressure 126/74, pulse 73, temperature 97.9 F (36.6 C), temperature source Oral, resp. rate 17, SpO2 97 %. Physical Exam  Constitutional: She is oriented to person, place, and time. She appears well-developed and well-nourished. No  distress.  HENT:  Head: Normocephalic and atraumatic.  Eyes: Conjunctivae are normal. Pupils are equal, round, and reactive to light. No scleral icterus.  Neck: Neck supple.  Cardiovascular: Normal rate.  Respiratory: Effort normal. No respiratory distress.  GI: Soft.  Musculoskeletal: Normal range of motion.  Neurological: She is alert and oriented to person, place, and time.  Skin: Skin is warm and dry. She is not diaphoretic.  Psychiatric: She has a normal mood and affect. Her behavior is normal. Judgment and thought content normal.     Assessment/Plan Left breast cancer Port in place  Plan port removal. Discussed risks/benefits.   Stark Klein, MD 04/16/2017, 7:44 AM

## 2017-04-16 NOTE — Discharge Instructions (Signed)
[facility] Office Phone Number [phone]   POST OP INSTRUCTIONS  Always review your discharge instruction sheet given to you by the facility where your surgery was performed.  IF YOU HAVE DISABILITY OR FAMILY LEAVE FORMS, YOU MUST BRING THEM TO THE OFFICE FOR PROCESSING.  DO NOT GIVE THEM TO YOUR DOCTOR.  1. A prescription for pain medication may be given to you upon discharge.  Take your pain medication as prescribed, if needed.  If narcotic pain medicine is not needed, then you may take acetaminophen (Tylenol) or ibuprofen (Advil) as needed. 2. Take your usually prescribed medications unless otherwise directed 3. If you need a refill on your pain medication, please contact your pharmacy.  They will contact our office to request authorization.  Prescriptions will not be filled after 5pm or on week-ends. 4. You should eat very light the first 24 hours after surgery, such as soup, crackers, pudding, etc.  Resume your normal diet the day after surgery 5. It is common to experience some constipation if taking pain medication after surgery.  Increasing fluid intake and taking a stool softener will usually help or prevent this problem from occurring.  A mild laxative (Milk of Magnesia or Miralax) should be taken according to package directions if there are no bowel movements after 48 hours. 6. You may shower in 48 hours.  The surgical glue will flake off in 2-3 weeks.   7. ACTIVITIES:  No strenuous activity or heavy lifting for 1 week.   a. You may drive when you no longer are taking prescription pain medication, you can comfortably wear a seatbelt, and you can safely maneuver your car and apply brakes. b. RETURN TO WORK:  __________as tolerated._______________ You should see your doctor in the office for a follow-up appointment approximately three-four weeks after your surgery.    WHEN TO CALL YOUR DOCTOR: 1. Fever over 101.0 2. Nausea and/or vomiting. 3. Extreme swelling or  bruising. 4. Continued bleeding from incision. 5. Increased pain, redness, or drainage from the incision.  The clinic staff is available to answer your questions during regular business hours.  Please don't hesitate to call and ask to speak to one of the nurses for clinical concerns.  If you have a medical emergency, go to the nearest emergency room or call 911.  A surgeon from [facility] is always on call at the hospital.  For further questions, please visit [email or web address]   

## 2017-04-16 NOTE — Transfer of Care (Signed)
Immediate Anesthesia Transfer of Care Note  Patient: Virginia [name]  Procedure(s) Performed: REMOVAL PORT-A-CATH (N/A Chest)  Patient Location: PACU  Anesthesia Type:MAC  Level of Consciousness: awake  Airway & Oxygen Therapy: Patient Spontanous Breathing and Patient connected to nasal cannula oxygen  Post-op Assessment: Report given to RN, Post -op Vital signs reviewed and stable and Patient moving all extremities X 4  Post vital signs: Reviewed and stable  Last Vitals:  Vitals:   04/16/17 0557  BP: 126/74  Pulse: 73  Resp: 17  Temp: 36.6 C  SpO2: 97%    Last Pain:  Vitals:   04/16/17 0557  TempSrc: Oral         Complications: No apparent anesthesia complications

## 2017-04-16 NOTE — Anesthesia Procedure Notes (Signed)
Procedure Name: MAC Date/Time: 04/16/2017 8:07 AM Performed by: Neldon Newport, CRNA Pre-anesthesia Checklist: Timeout performed, Patient being monitored, Emergency Drugs available, Suction available and Patient identified Oxygen Delivery Method: Simple face mask Placement Confirmation: positive ETCO2 Dental Injury: Teeth and Oropharynx as per pre-operative assessment

## 2017-04-16 NOTE — Anesthesia Postprocedure Evaluation (Signed)
Anesthesia Post Note  Patient: Virginia [name]  Procedure(s) Performed: REMOVAL PORT-A-CATH (N/A Chest)     Patient location during evaluation: PACU Anesthesia Type: General Level of consciousness: awake and alert Pain management: pain level controlled Vital Signs Assessment: post-procedure vital signs reviewed and stable Respiratory status: spontaneous breathing and respiratory function stable Cardiovascular status: stable Postop Assessment: no apparent nausea or vomiting Anesthetic complications: no    Last Vitals:  Vitals:   04/16/17 0845 04/16/17 0915  BP: (!) 148/96 (!) 151/90  Pulse: 80 62  Resp: 10 10  Temp:  36.6 C  SpO2: 98% 100%    Last Pain:  Vitals:   04/16/17 0915  TempSrc:   PainSc: 0-No pain                 Shawan Corella DANIEL

## 2017-04-16 NOTE — Progress Notes (Signed)
POCT urine pregnancy performed, results negative, control acceptable manually entered into [employer]. Results have not linked to [facility]. Call placed to lab, Virginia [name]) states that POCT would need to be notified to push results over and they arrive at 0830.

## 2017-04-17 ENCOUNTER — Encounter (HOSPITAL_COMMUNITY): Payer: Self-pay | Admitting: General Surgery

## 2017-04-22 ENCOUNTER — Other Ambulatory Visit: Payer: Self-pay

## 2017-04-22 ENCOUNTER — Ambulatory Visit: Payer: PRIVATE HEALTH INSURANCE | Attending: Oncology | Admitting: Physical Therapy

## 2017-04-22 DIAGNOSIS — R252 Cramp and spasm: Secondary | ICD-10-CM | POA: Insufficient documentation

## 2017-04-22 DIAGNOSIS — M6281 Muscle weakness (generalized): Secondary | ICD-10-CM | POA: Insufficient documentation

## 2017-04-22 NOTE — Therapy (Signed)
[facility] [address], [place], [place], [address] Phone: [phone]   Fax:  [phone]  Physical Therapy Evaluation  Patient Details  Name: Virginia [name] MRN: [id number] Date of Birth: Apr 06, 1980 Referring Provider: Chauncey Cruel   Encounter Date: 04/22/2017  PT End of Session - 04/22/17 1623    Visit Number  1    Date for PT Re-Evaluation  06/03/17    PT Start Time  1608    PT Stop Time  1648    PT Time Calculation (min)  40 min    Activity Tolerance  Patient tolerated treatment well    Behavior During Therapy  [facility] for tasks assessed/performed       Past Medical History:  Diagnosis Date  . Anemia    as a child  . Anxiety   . Cholestasis   . Elevated liver enzymes    during pregnancy March 2015  . Family history of breast cancer   . Family history of prostate cancer   . GERD (gastroesophageal reflux disease)    one episode  . Headache   . History of radiation therapy 06/11/16- 07/23/16   Left Breast 50 Gy in 25 fractions, Left Breast boost 10 Gy in 5 fractions.   . Hypothyroidism   . Lymphedema of arm    LEFT  . 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 BREAST LUMPECTOMY WITH RADIOACTIVE SEED AND SENTINEL LYMPH NODE BIOPSY Left 03/13/2016   Procedure: LEFT BREAST LUMPECTOMY WITH RADIOACTIVE SEED X 2 AND SENTINEL LYMPH NODE BIOPSY;  Surgeon: Stark Klein, MD;  Location: [place];  Service: General;  Laterality: Left;  . NO PAST SURGERIES    . PORT-A-CATH REMOVAL N/A 04/16/2017   Procedure: REMOVAL PORT-A-CATH;  Surgeon: Stark Klein, MD;  Location: [place];  Service: General;  Laterality: N/A;  . PORTACATH PLACEMENT Right 03/13/2016   Procedure: INSERTION PORT-A-CATH;  Surgeon: Stark Klein, MD;  Location: [place];  Service: General;  Laterality: Right;    There were no vitals filed for this  visit.   Subjective Assessment - 04/22/17 1611    Subjective  Patient has medication to decrease estrogen.   She states she has been noticing vaginal discomfort since December.      Pertinent History  breast cancer estrogen receptor positive    Limitations  Sitting    Patient Stated Goals  get rid of discomfort in sitting and be able to have intercourse    Currently in Pain?  Yes    Pain Score  4     Pain Location  Genitalia    Pain Orientation  Mid    Pain Descriptors / Indicators  Sore;Tightness    Pain Type  Other (Comment) medically induced menopaus    Pain Onset  More than a month ago    Pain Frequency  Intermittent    Aggravating Factors   sitting and intercourse    Pain Relieving Factors  standing and walking    Effect of Pain on Daily Activities  work, unable to have intercourse    Multiple Pain Sites  No         [facility] PT Assessment - 04/22/17 0001      Assessment   Medical Diagnosis  C50.212,Z17.0 (ICD-10-CM) - Malignant neoplasm of upper-inner quadrant of left breast in female, estrogen receptor positive [facility]    Referring Provider  Magrinat, Sarajane Jews  C    Onset Date/Surgical Date  -- sometime in December    Prior Therapy  No      Precautions   Precautions  None      Restrictions   Weight Bearing Restrictions  No      Balance Screen   Has the patient fallen in the past 6 months  No      [place] residence    Living Arrangements  Spouse/significant other;Children      Prior Function   Level of Independence  Independent    Vocation  Full time employment    Loss adjuster, chartered in care management, predominantly [place]  tries to go to the gym at least 3 times a week; treadmill walking or walking + running, lifting weights a bit      Cognition   Overall Cognitive Status  Within Functional Limits for tasks assessed      Observation/Other Assessments   Focus on Therapeutic Outcomes (FOTO)   40% limited  based on clinical assessment      Posture/Postural Control   Posture/Postural Control  Postural limitations    Postural Limitations  Forward head;Anterior pelvic tilt    Posture Comments  hip flexion in standing             Objective measurements completed on examination: See above findings.    Pelvic Floor Special Questions - 04/22/17 0001    Prior Pelvic/Prostate Exam  Yes    Are you Pregnant or attempting pregnancy?  Yes    Prior Pregnancies  Yes    Number of Pregnancies  2    Number of Vaginal Deliveries  2    Any difficulty with labor and deliveries  Yes cholecyscectomy    Currently Sexually Active  No    Marinoff Scale  pain prevents any attempts at intercourse    Urinary Leakage  --    Urinary urgency  No    Urinary frequency  at night; 3x    Fecal incontinence  No    Falling out feeling (prolapse)  No    Skin Integrity  Other    Skin Integrity other  dryness    Perineal Body/Introitus   Elevated    Pelvic Floor Internal Exam  pt infomed and consent given to perform internal assessment of soft tissue    Exam Type  Vaginal    Sensation  hypersensative    Palpation  fascial restrictions around urethra, tight and tender transverse peroneus.     Strength  Flicker    Strength # of reps  2    Strength # of seconds  1    Tone  high               PT Education - 04/22/17 1653    Education provided  Yes    Education Details  moisturizer, lubricant, balloon breathing, self massage    Person(s) Educated  Patient    Methods  Explanation;Handout    Comprehension  Verbalized understanding       PT Short Term Goals - 04/22/17 1717      PT SHORT TERM GOAL #1   Title  ind with self massage and using moisturizers for improved tissue quality    Time  2    Period  Weeks    Status  New    [place] Date  05/06/17      PT SHORT TERM GOAL #2   Title  ind with pelvic floor stretches    Time  4    Period  Weeks    Status  New    Target Date  05/20/17         PT Long Term Goals - 04/22/17 1718      PT LONG TERM GOAL #1   Title  pt will report 75% less pain in sitting due to improved soft tissue quality    Time  8    Period  Weeks    Status  New    Target Date  06/17/17      PT LONG TERM GOAL #2   Title  pt will be ind with advanced HEP    Time  8    Period  Weeks    Status  New    Target Date  06/17/17      PT LONG TERM GOAL #3   Title  pt will report 1/3 on Marinoff scale    Time  8    Period  Weeks    Status  New    Target Date  06/17/17      PT LONG TERM GOAL #4   Title  pt will have 2/5 MMT of pelvic floor and able to hold for at lease 5 seconds for improved bladder control    Time  8    Period  Weeks    Status  New    Target Date  06/17/17             Plan - 04/22/17 1654    Clinical Impression Statement  Patient presents to clinic due to vaginal discomfort due to decreased estrogen.  Pt is currently unable to attempt intercourse due to pain and is having pain when sitting at work.  Pt has a lot of pelvic floor tightness and fascial restrictions throughout.  Urethra is very stiff.  Pt demonstrates pelvic floor weakness 1/5 with very high tone throughout.  Pt has decreased ribcage ROM with decreased movement during breathing.  Pt is tender to palpation throughout pelvic floor and externally on ischiocaveronis.  Pt will benefit from skilled PT to address impairments for imroved ability to perform work duties and return to activities for improved quality of life.      Clinical Presentation  Evolving    Clinical Presentation due to:  pt has been having worsening symptoms    Clinical Decision Making  Low    Rehab Potential  Excellent    PT Frequency  1x / week    PT Duration  8 weeks    PT Treatment/Interventions  ADLs/Self Care Home Management;Patient/family education;Manual techniques;Manual lymph drainage;Compression bandaging;Scar mobilization;Taping;Biofeedback;Cryotherapy;Electrical Stimulation;Moist  Heat;Therapeutic activities;Therapeutic exercise;Neuromuscular re-education;Passive range of motion;Dry needling    PT Next Visit Plan  pelvic floor stretches, internal STM, f/u with moistruizer and self massage    Consulted and Agree with Plan of Care  Patient       Patient will benefit from skilled therapeutic intervention in order to improve the following deficits and impairments:  Pain, Increased muscle spasms, Increased fascial restricitons, Decreased range of motion, Decreased strength  Visit Diagnosis: Cramp and spasm  Muscle weakness (generalized)     Problem List Patient Active Problem List   Diagnosis Date Noted  . Port-A-Cath in place 02/27/2017  . Headache disorder 09/26/2016  . Genetic testing 02/26/2016  . Family history of breast cancer   . Family history of prostate cancer   . Malignant neoplasm of upper-inner quadrant of left breast in  female, estrogen receptor positive ([place]) 01/16/2016  . Postpartum state 05/20/2013  . Cholelithiasis complicating pregnancy, antepartum 05/18/2013  . Cholestasis of pregnancy 04/28/2013    Zannie Cove, PT 04/22/2017, 5:22 PM  [facility] [address] [place], [place], [address] Phone: [phone]   Fax:  [phone]  Name: GALENA LOGIE MRN: [id number] Date of Birth: Nov 21, 1980

## 2017-04-22 NOTE — Patient Instructions (Signed)
Moisturizers . They are used in the vagina to hydrate the mucous membrane that make up the vaginal canal. . Designed to keep a more normal acid balance (ph) . Once placed in the vagina, it will last between two to three days.  . Use 2-3 times per week at bedtime and last longer than 60 min. . Ingredients to avoid is glycerin and fragrance, can increase chance of infection . Should not be used just before sex due to causing irritation . Most are gels administered either in a tampon-shaped applicator or as a vaginal suppository. They are non-hormonal.   Types of Moisturizers . Samul Dada- drug store . Vitamin E vaginal suppositories- Whole foods, Amazon . Moist Again . Coconut oil- can break down condoms . Michail Jewels . Yes moisturizer- amazon . NeuEve Silk , NeuEve Silver for menopausal or over 65 (if have severe vaginal atrophy or cancer treatments use NeuEve Silk for  1 month than move to [employer] Pepsi)- [employer], MapleFlower.dk . Olive and Bee intimate cream- www.oliveandbee.com.au  Creams to use externally on the Vulva area  Albertson's (good for for cancer patients that had radiation to the area)- [place] or [employer] Corporation.FlyingBasics.com.br  V-magic cream - amazon  Julva-amazon  Vital "V [place] salve ( help moisturize and help with thinning vulvar area, does have [place]   Things to avoid in the vaginal area . Do not use things to irritate the vulvar area . No lotions just specialized creams for the vulva area- Neogyn, V-magic, No soaps; can use Aveeno or Calendula cleanser if needed. Must be gentle . No deodorants . No douches . Good to sleep without underwear to let the vaginal area to air out . No scrubbing: spread the lips to let warm water rinse over labias and pat dry  Lubrication . Used for intercourse to reduce friction . Avoid ones that have glycerin, warming gels, tingling gels, icing or  cooling gel, scented . May need to be reapplied once or several times during sexual activity . Can be applied to both partners genitals prior to vaginal penetration to minimize friction or irritation . Prevent irritation and mucosal tears that cause post coital pain and increased the risk of vaginal and urinary tract infections . Oil-based lubricants cannot be used with condoms due to breaking them down.  Least likely to irritate vaginal tissue.  . Plant based-lubes are safe . Silicone-based lubrication are thicker and last long and used for post-menopausal women Types of Lubricants . Good Clean Love (water based)-[employer], Target, Walmart, CVS . Slippery Stuff(water based) [employer] . Sylk (water based) [employer], Gem- drug store; www.blossom-organics.com . Samul Dada- Drug store . Coconut oil- will breakdown condoms, least irritating . Aloe Vera- least irritating . Sliquid Natural H20 (water based)-Walgreen's, good if frequent UTI's . Wet Platinum- (Silicone) Target, Walgreen's . Yes [place] . [place] Jelly, Replens, and Astroglide kills good Bacteria (lactobadilli)  Things to avoid in the vaginal area . Do not use things to irritate the vulvar area . No lotions . No soaps; can use Aveeno or Calendula cleanser if needed. Must be gentle . No deodorants . No douches . Good to sleep without underwear to let the vaginal area to air out . No scrubbing: spread the lips to let warm water rinse over labias and pat dry   STRETCHING THE PELVIC FLOOR MUSCLES NO DILATOR  Supplies . Vaginal lubricant . Mirror (optional) .  Gloves (optional) Positioning . Start in a semi-reclined position with your head propped up. Bend your knees and place your thumb or finger at the vaginal opening. Procedure . Apply a moderate amount of lubricant on the outer skin of your vagina, the labia minora.  Apply additional lubricant to your finger. Marland Kitchen Spread the skin away from the vaginal opening.  Place the end of your finger at the opening. . Do a maximum contraction of the pelvic floor muscles. Tighten the vagina and the anus maximally and relax. . When you know they are relaxed, gently and slowly insert your finger into your vagina, directing your finger slightly downward, for 2-3 inches of insertion. . Relax and stretch the 6 o'clock position . Hold each stretch for _2 min__ and repeat __1_ time with rest breaks of _1__ seconds between each stretch. . Repeat the stretching in the 4 o'clock and 8 o'clock positions. . Total time should be _6__ minutes, _1__ x per day.  Note the amount of theme your were able to achieve and your tolerance to your finger in your vagina. . Once you have accomplished the techniques you may try them in standing with one foot resting on the tub, or in other positions.  This is a good stretch to do in the shower if you don't need to use lubricant.   Balloon Breath    Place hands LIGHTLY on belly below navel. Imagine a balloon inside belly. Blow up balloon on breath IN, deflate balloon on breath OUT. Contract abdominals slightly to assist breath OUT. Time _5__ minutes.  Copyright  VHI. All rights reserved.

## 2017-04-23 ENCOUNTER — Encounter (HOSPITAL_COMMUNITY): Payer: Self-pay | Admitting: Internal Medicine

## 2017-04-23 ENCOUNTER — Ambulatory Visit (HOSPITAL_COMMUNITY)
Admission: RE | Admit: 2017-04-23 | Discharge: 2017-04-23 | Disposition: A | Discharge status: Home / Self Care | Payer: PRIVATE HEALTH INSURANCE | Source: Ambulatory Visit | Attending: Internal Medicine | Admitting: Internal Medicine

## 2017-04-23 ENCOUNTER — Ambulatory Visit (HOSPITAL_BASED_OUTPATIENT_CLINIC_OR_DEPARTMENT_OTHER)
Admission: RE | Admit: 2017-04-23 | Discharge: 2017-04-23 | Disposition: A | Discharge status: Home / Self Care | Payer: PRIVATE HEALTH INSURANCE | Source: Ambulatory Visit | Attending: Internal Medicine | Admitting: Internal Medicine

## 2017-04-23 VITALS — BP 157/91 | HR 67 | Wt 198.0 lb

## 2017-04-23 DIAGNOSIS — Z8042 Family history of malignant neoplasm of prostate: Secondary | ICD-10-CM | POA: Diagnosis not present

## 2017-04-23 DIAGNOSIS — Z803 Family history of malignant neoplasm of breast: Secondary | ICD-10-CM | POA: Insufficient documentation

## 2017-04-23 DIAGNOSIS — Z170 Estrogen receptor positive status [ER+]: Secondary | ICD-10-CM | POA: Diagnosis not present

## 2017-04-23 DIAGNOSIS — C50212 Malignant neoplasm of upper-inner quadrant of left female breast: Secondary | ICD-10-CM | POA: Diagnosis not present

## 2017-04-23 DIAGNOSIS — Z8249 Family history of ischemic heart disease and other diseases of the circulatory system: Secondary | ICD-10-CM | POA: Diagnosis not present

## 2017-04-23 DIAGNOSIS — Z801 Family history of malignant neoplasm of trachea, bronchus and lung: Secondary | ICD-10-CM | POA: Insufficient documentation

## 2017-04-23 DIAGNOSIS — Z9889 Other specified postprocedural states: Secondary | ICD-10-CM | POA: Insufficient documentation

## 2017-04-23 DIAGNOSIS — I10 Essential (primary) hypertension: Secondary | ICD-10-CM

## 2017-04-23 DIAGNOSIS — E079 Disorder of thyroid, unspecified: Secondary | ICD-10-CM | POA: Diagnosis not present

## 2017-04-23 DIAGNOSIS — Z880 Allergy status to penicillin: Secondary | ICD-10-CM | POA: Diagnosis not present

## 2017-04-23 DIAGNOSIS — Z800 Family history of malignant neoplasm of digestive organs: Secondary | ICD-10-CM | POA: Diagnosis not present

## 2017-04-23 DIAGNOSIS — C50912 Malignant neoplasm of unspecified site of left female breast: Secondary | ICD-10-CM | POA: Insufficient documentation

## 2017-04-23 NOTE — Progress Notes (Signed)
  Echocardiogram 2D Echocardiogram has been performed.  Merrie Roof F 04/23/2017, 3:18 PM

## 2017-04-23 NOTE — Progress Notes (Signed)
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 Jana Hakim for enrollment into the Cardio-Oncology program.   Cancer history as follow:   (1) tamoxifen started 01/23/2016 neoadjuvantly (2) status post left lumpectomy and sentinel lymph node sampling 03/13/2016 for an mpT1c pN0, stage IA invasive ductal carcinoma, grade 2, with negative margins. (3) paclitaxel weekly 12 and trastuzumab every 21 days beginning on 04/10/2016.  (a) paclitaxel discontinued after 5 doses because of atypical neuropathy symptoms. Last dose 04/28/2016 (4) trastuzumab to be continued to complete a year--through February 2019 (5) adjuvant radiation completed in summer 2018 (6) anti-estrogens to resume at the completion of local treatment, to be continued for 5-10 years  Started initial chemo in February 2018. 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 She has now finished Herceptin.   She presents today for follow up with Echo. She has finished Herceptin recently.  She denies SOB, PND, or orthopnea. She was getting mild edema after herceptin days, but no further now that she is done. Continues to work and exercise on occasion.  She states her BP usually runs in the 130s.   Echo 01/14/17: EF 60-65%.  Lateral s' 10.1 cm/s. GLS -16.8% (underestimated due to poor endocardial tracking) Echo 05/28/16: EF 65-70%.  Lateral s' 11.2 cm/s. GLS -22.4%  PAST MEDICAL HISTORY: Past Medical History:  Diagnosis Date  .  Anemia    as a child  . Anxiety   . Cholestasis   . Elevated liver enzymes    during pregnancy March 2015  . Family history of breast cancer   . Family history of prostate cancer   . GERD (gastroesophageal reflux disease)    one episode  . Headache   . History of radiation therapy 06/11/16- 07/23/16   Left Breast 50 Gy in 25 fractions, Left Breast boost 10 Gy in 5 fractions.   . Hypothyroidism   . Lymphedema of arm    LEFT  . Malignant neoplasm of upper-inner quadrant of left female breast ([place]) 01/16/2016   PAST SURGICAL HISTORY: Past Surgical History:  Procedure Laterality Date  . BREAST LUMPECTOMY WITH RADIOACTIVE SEED AND SENTINEL LYMPH NODE BIOPSY Left 03/13/2016   Procedure: LEFT BREAST LUMPECTOMY WITH RADIOACTIVE SEED X 2 AND SENTINEL LYMPH NODE BIOPSY;  Surgeon: Stark Klein, MD;  Location: [place];  Service: General;  Laterality: Left;  . NO PAST SURGERIES    . PORT-A-CATH REMOVAL N/A 04/16/2017   Procedure: REMOVAL PORT-A-CATH;  Surgeon: Stark Klein, MD;  Location: [place];  Service: General;  Laterality: N/A;  . PORTACATH PLACEMENT Right 03/13/2016   Procedure: INSERTION PORT-A-CATH;  Surgeon: Stark Klein, MD;  Location: [place];  Service: General;  Laterality: Right;   FAMILY HISTORY Family History  Problem Relation Age of Onset  . Hypertension Mother   . Hypertension Father   . Prostate cancer Father   . Liver cancer Maternal Grandmother   . Breast cancer Paternal Grandmother        dx in her 33s  . Lung cancer Paternal Aunt  smoker  . Heart attack Paternal Grandfather   . Liver cancer Other   The patient's parents are alive, in their mid 68s as of November 2017. The patient father was diagnosed with prostate cancer at age 20. A paternal aunt was diagnosed with lung cancer in her late 72s. Also the paternal grandmother was diagnosed with breast cancer at the age of 69. On the mother's side there are 2 cases of liver cancer, age of diagnosis unknown.  GYNECOLOGIC  HISTORY:  No LMP recorded.  Menarche age 24, first live birth age 30, she is [place] P2. She still having periods, most recently 01/21/2016, but they are no longer regular. She remotely used a number ring and oral contraceptives but stopped in 2016.Currently there use barrier methods for contraception.  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 Her husband [name] works as a Printmaker for a Forensic psychologist. Their children are [name] and [name], aged 17 and 2 as of November 2017.    HEALTH MAINTENANCE: Social History   Tobacco Use  . Smoking status: Never Smoker  . Smokeless tobacco: Never Used  Substance Use Topics  . Alcohol use: No  . Drug use: No    Colonoscopy:  PAP:2016  Bone density:   Allergies  Allergen Reactions  . Amoxicillin Diarrhea and Nausea Only    Has patient had a PCN reaction causing immediate rash, facial/tongue/throat swelling, SOB or lightheadedness with hypotension: No Has patient had a PCN reaction causing severe rash involving mucus membranes or skin necrosis: No Has patient had a PCN reaction that required hospitalization No Has patient had a PCN reaction occurring within the last 10 years: Yes If all of the above answers are "NO", then may proceed with Cephalosporin use.    Current Outpatient Medications  Medication Sig Dispense Refill  . acetaminophen (TYLENOL) 500 MG tablet Take 500 mg by mouth every 6 (six) hours as needed for headache.     . butalbital-acetaminophen-caffeine (FIORICET, ESGIC) 50-325-40 MG tablet Take 1 tablet by mouth every 6 (six) hours as needed for headache.    Marland Kitchen goserelin (ZOLADEX) 10.8 MG injection Inject 10.8 mg into the skin every 3 (three) months. She is not sure of the exact dose     . ibuprofen (ADVIL,MOTRIN) 200 MG tablet Take 400 mg by mouth every 8 (eight) hours as needed for mild pain.     . Multiple Vitamins-Minerals (MULTIVITAMIN GUMMIES ADULT PO) Take 2 tablets  by mouth daily.    . tamoxifen (NOLVADEX) 20 MG tablet Take 1 tablet (20 mg total) daily by mouth. 30 tablet 3   No current facility-administered medications for this encounter.    Vitals:   04/23/17 1520  BP: (!) 157/91  Pulse: 67  SpO2: 99%     Body mass index is 31.01 kg/m.      ECOG FS:1 - Symptomatic but completely ambulatory   Physical Exam.  General: Well appearing. No resp difficulty. HEENT: Normal. Anicteric  Neck: Supple. JVP 5-6. Carotids 2+ bilat; no bruits. No thyromegaly or nodule noted. Cor: PMI nondisplaced. RRR, No M/G/R noted Lungs: CTAB, normal effort. Abdomen: Obese Soft, non-tender, non-distended, no HSM. No bruits or masses. +BS  Extremities: No cyanosis, clubbing, or rash. R and LLE no edema.  Neuro: Alert & orientedx3, cranial nerves grossly intact. moves all 4 extremities w/o difficulty. Affect pleasant   LAB RESULTS:  CMP     Component Value Date/Time   NA 141  04/10/2017 1432   NA 142 02/27/2017 1350   K 3.7 04/10/2017 1432   K 3.7 02/27/2017 1350   CL 105 04/10/2017 1432   CO2 27 04/10/2017 1432   CO2 28 02/27/2017 1350   GLUCOSE 95 04/10/2017 1432   GLUCOSE 124 02/27/2017 1350   BUN 13 04/10/2017 1432   BUN 8.9 02/27/2017 1350   CREATININE 0.85 04/10/2017 1432   CREATININE 0.8 02/27/2017 1350   CALCIUM 9.7 04/10/2017 1432   CALCIUM 9.5 02/27/2017 1350   PROT 7.4 04/10/2017 1432   PROT 7.3 02/27/2017 1350   ALBUMIN 3.9 04/10/2017 1432   ALBUMIN 3.8 02/27/2017 1350   AST 21 04/10/2017 1432   AST 21 02/27/2017 1350   ALT 20 04/10/2017 1432   ALT 25 02/27/2017 1350   ALKPHOS 92 04/10/2017 1432   ALKPHOS 82 02/27/2017 1350   BILITOT <0.2 (L) 04/10/2017 1432   BILITOT 0.22 02/27/2017 1350   GFRNONAA >60 04/10/2017 1432   GFRAA >60 04/10/2017 1432    INo results found for: SPEP, UPEP  Lab Results  Component Value Date   WBC 4.0 04/10/2017   NEUTROABS 1.9 04/10/2017   HGB 13.0 04/09/2017   HCT 38.8 04/10/2017   MCV 83.8  04/10/2017   PLT 167 04/10/2017      Chemistry      Component Value Date/Time   NA 141 04/10/2017 1432   NA 142 02/27/2017 1350   K 3.7 04/10/2017 1432   K 3.7 02/27/2017 1350   CL 105 04/10/2017 1432   CO2 27 04/10/2017 1432   CO2 28 02/27/2017 1350   BUN 13 04/10/2017 1432   BUN 8.9 02/27/2017 1350   CREATININE 0.85 04/10/2017 1432   CREATININE 0.8 02/27/2017 1350      Component Value Date/Time   CALCIUM 9.7 04/10/2017 1432   CALCIUM 9.5 02/27/2017 1350   ALKPHOS 92 04/10/2017 1432   ALKPHOS 82 02/27/2017 1350   AST 21 04/10/2017 1432   AST 21 02/27/2017 1350   ALT 20 04/10/2017 1432   ALT 25 02/27/2017 1350   BILITOT <0.2 (L) 04/10/2017 1432   BILITOT 0.22 02/27/2017 1350       No results found for: LABCA2  No components found for: LABCA125  No results for input(s): INR in the last 168 hours.  Urinalysis    Component Value Date/Time   COLORURINE YELLOW 10/14/2012 1954   APPEARANCEUR CLEAR 10/14/2012 1954   LABSPEC >1.030 (H) 10/14/2012 1954   PHURINE 6.0 10/14/2012 1954   GLUCOSEU NEGATIVE 10/14/2012 1954   HGBUR TRACE (A) 10/14/2012 1954   BILIRUBINUR NEGATIVE 10/14/2012 1954   KETONESUR 15 (A) 10/14/2012 1954   PROTEINUR NEGATIVE 10/14/2012 1954   UROBILINOGEN 0.2 10/14/2012 1954   NITRITE NEGATIVE 10/14/2012 1954   LEUKOCYTESUR NEGATIVE 10/14/2012 1954    Assessment/Plan:  1. Left breast CA - triple positive. s/p lumpectomy and XRT. - Herceptin started 2/18. She is now finished.  - Echo reviewed today. EF stable at ~ 65%. GLS stable as above. No evidence of cardiotoxicity.   2. HTN - She states her BPs run in the 130s usually. (Confirmed via chart review) - Now that she has finished cardio-oncology screening, will leave this management to her PCP.   Shirley Friar, PA-C  3:20 PM   Patient seen and examined with the above-signed Advanced Practice Provider and/or Housestaff. I personally reviewed laboratory data, imaging studies and  relevant notes. I independently examined the patient and formulated the important aspects of the plan.  I have edited the note to reflect any of my changes or salient points. I have personally discussed the plan with the patient and/or family.  I reviewed echos personally. EF and Doppler parameters stable. No HF on exam. She has finished Herceptin therapy with no evidence of cardio-toxicity. BP elevated here but in reviewing flowsheet appears better controlled at other visits. Discussed need for close BP monitoring as well as exercise and weight management. Will f/u with PCP. Can add losartan as needed.   Glori Bickers, MD  9:50 PM

## 2017-04-23 NOTE — Patient Instructions (Signed)
Follow up with Dr.Bensimhon as needed.

## 2017-04-24 ENCOUNTER — Other Ambulatory Visit: Payer: Self-pay | Admitting: Oncology

## 2017-04-29 ENCOUNTER — Ambulatory Visit: Payer: PRIVATE HEALTH INSURANCE | Attending: Oncology | Admitting: Physical Therapy

## 2017-04-29 ENCOUNTER — Encounter: Payer: Self-pay | Admitting: Physical Therapy

## 2017-04-29 DIAGNOSIS — R252 Cramp and spasm: Secondary | ICD-10-CM

## 2017-04-29 DIAGNOSIS — M6281 Muscle weakness (generalized): Secondary | ICD-10-CM | POA: Diagnosis present

## 2017-04-29 NOTE — Therapy (Signed)
[facility] [address], [place], [place], [address] Phone: [phone]   Fax:  [phone]  Physical Therapy Treatment  Patient Details  Name: Virginia [name] MRN: [id number] Date of Birth: 1981/01/25 Referring Provider: Chauncey Cruel   Encounter Date: 04/29/2017  PT End of Session - 04/29/17 1534    Visit Number  2    Date for PT Re-Evaluation  06/03/17    PT Start Time  1534    PT Stop Time  1616    PT Time Calculation (min)  42 min    Activity Tolerance  Patient tolerated treatment well;Patient limited by pain    Behavior During Therapy  [facility] for tasks assessed/performed       Past Medical History:  Diagnosis Date  . Anemia    as a child  . Anxiety   . Cholestasis   . Elevated liver enzymes    during pregnancy March 2015  . Family history of breast cancer   . Family history of prostate cancer   . GERD (gastroesophageal reflux disease)    one episode  . Headache   . History of radiation therapy 06/11/16- 07/23/16   Left Breast 50 Gy in 25 fractions, Left Breast boost 10 Gy in 5 fractions.   . Hypothyroidism   . Lymphedema of arm    LEFT  . Malignant neoplasm of upper-inner quadrant of left female breast ([place]) 01/16/2016    Past Surgical History:  Procedure Laterality Date  . BREAST LUMPECTOMY WITH RADIOACTIVE SEED AND SENTINEL LYMPH NODE BIOPSY Left 03/13/2016   Procedure: LEFT BREAST LUMPECTOMY WITH RADIOACTIVE SEED X 2 AND SENTINEL LYMPH NODE BIOPSY;  Surgeon: Stark Klein, MD;  Location: [place];  Service: General;  Laterality: Left;  . NO PAST SURGERIES    . PORT-A-CATH REMOVAL N/A 04/16/2017   Procedure: REMOVAL PORT-A-CATH;  Surgeon: Stark Klein, MD;  Location: [place];  Service: General;  Laterality: N/A;  . PORTACATH PLACEMENT Right 03/13/2016   Procedure: INSERTION PORT-A-CATH;  Surgeon: Stark Klein, MD;  Location: [place];  Service: General;  Laterality: Right;    There were no vitals  filed for this visit.  Subjective Assessment - 04/29/17 1537    Subjective  Pt states she has has some success with self massage and doesn't feel as much pain in sitting.  She is still unable to initial intercourse due to pain. Pt denies pain currently    Pertinent History  breast cancer estrogen receptor positive    Patient Stated Goals  get rid of discomfort in sitting and be able to have intercourse    Currently in Pain?  No/denies                      [facility] Adult PT Treatment/Exercise - 04/29/17 0001      Self-Care   Other Self-Care Comments   self massage with massage roller      Neuro Re-ed    Neuro Re-ed Details   body awareness with pelvic floor relaxation and breathing techniques      Exercises   Exercises  Other Exercises    Other Exercises   pelvic floor stretches as shown in HEP      Manual Therapy   Manual Therapy  Internal Pelvic Floor;Soft tissue mobilization    Manual therapy comments  pt informed and consent given to perform internal soft tissue release    Soft tissue mobilization  ischiocavernosis externally, quads and adductor  Internal Pelvic Floor  deferred due to tenderness and fascial restrictions externally             PT Education - 04/29/17 1736    Education provided  Yes    Education Details  pelvic floor stretches, youtube meditation, massage roller    Person(s) Educated  Patient    Methods  Explanation;Demonstration;Verbal cues;Handout    Comprehension  Verbalized understanding;Returned demonstration       PT Short Term Goals - 04/29/17 1815      PT SHORT TERM GOAL #1   Title  ind with self massage and using moisturizers for improved tissue quality    Time  2    Period  Weeks    Status  On-going      PT SHORT TERM GOAL #2   Title  ind with pelvic floor stretches    Baseline  learned today    Time  4    Period  Weeks    Status  On-going        PT Long Term Goals - 04/22/17 1718      PT LONG TERM GOAL #1    Title  pt will report 75% less pain in sitting due to improved soft tissue quality    Time  8    Period  Weeks    Status  New    Target Date  06/17/17      PT LONG TERM GOAL #2   Title  pt will be ind with advanced HEP    Time  8    Period  Weeks    Status  New    Target Date  06/17/17      PT LONG TERM GOAL #3   Title  pt will report 1/3 on Marinoff scale    Time  8    Period  Weeks    Status  New    Target Date  06/17/17      PT LONG TERM GOAL #4   Title  pt will have 2/5 MMT of pelvic floor and able to hold for at lease 5 seconds for improved bladder control    Time  8    Period  Weeks    Status  New    Target Date  06/17/17            Plan - 04/29/17 1738    Clinical Impression Statement  Pt was very guarded and painful to myofascial release externally to perineum.  Pt was able to relax when guided with body awareness and breathing.  STM to quads and adductors had good result with increased soft tissue length and pliability.  Pt will benefit from skilled PT to work on improved soft tissue length and control in order to return to functional activities such as self care and improved quality of life.    PT Treatment/Interventions  ADLs/Self Care Home Management;Patient/family education;Manual techniques;Manual lymph drainage;Compression bandaging;Scar mobilization;Taping;Biofeedback;Cryotherapy;Electrical Stimulation;Moist Heat;Therapeutic activities;Therapeutic exercise;Neuromuscular re-education;Passive range of motion;Dry needling    PT Next Visit Plan  pelvic floor stretches, STM to lumbar, abdomen, LE, external perineum fascial release, internal STM as tolerated    Consulted and Agree with Plan of Care  Patient       Patient will benefit from skilled therapeutic intervention in order to improve the following deficits and impairments:  Pain, Increased muscle spasms, Increased fascial restricitons, Decreased range of motion, Decreased strength  Visit Diagnosis: Cramp  and spasm  Muscle weakness (generalized)     Problem List  Patient Active Problem List   Diagnosis Date Noted  . Port-A-Cath in place 02/27/2017  . Headache disorder 09/26/2016  . Genetic testing 02/26/2016  . Family history of breast cancer   . Family history of prostate cancer   . Malignant neoplasm of upper-inner quadrant of left breast in female, estrogen receptor positive ([place]) 01/16/2016  . Postpartum state 05/20/2013  . Cholelithiasis complicating pregnancy, antepartum 05/18/2013  . Cholestasis of pregnancy 04/28/2013    Zannie Cove, PT 04/29/2017, 6:15 PM  [facility] [address], [place], [place], [address] Phone: [phone]   Fax:  [phone]  Name: YESMIN MUTCH MRN: [id number] Date of Birth: 01/10/1981

## 2017-04-29 NOTE — Patient Instructions (Signed)
Massage roller: bottom of feet, calves, legs   Position yourself as shown grabbing onto feet or behind the knees. You should feel a gentle stretch. Breathe in and allow the pelvic floor muscles to relax. Hold 1 min. 2 times per day.  Adductors, Frog Squat   BACK: Child's Pose (Sciatica)    Sit in knee-chest position and reach arms forward. Separate knees for comfort. Hold position for _60__ breaths. Repeat _2__ times. Do _2__ times per day.  Copyright  VHI. All rights reserved.   Piriformis Stretch    Lying on back, pull right knee toward opposite shoulder. Hold _30___ seconds. Repeat __2__ times. Do _1___ sessions per day.  http://[email or web address].exer.us/258   Copyright  VHI. All rights reserved.   Piriformis Stretch, Supine    Lie supine, one ankle crossed onto opposite knee. Holding bottom leg behind knee, gently pull legs toward chest until stretch is felt in buttock of top leg. Hold _30__ seconds. For deeper stretch gently push top knee away from body.  Repeat _2__ times per session. Do _2__ sessions per day.  Copyright  VHI. All rights reserved.    Supine Knee-to-Chest, Unilateral    Lie on back, hands clasped behind one knee. Pull knee in toward chest until a comfortable stretch is felt in lower back and buttocks. Hold 30___ seconds.  Repeat __2_ times per session. Do _1__ sessions per day.  Copyright  VHI. All rights reserved.  Supine With Rotation    Lie on back with one knee drawn toward chest. Slowly bring bent leg across body until stretch is felt in lower back area. Hold _30__ seconds. Repeat to other side. Repeat _2__ times per session. Do __2_ sessions per day.  Copyright  VHI. All rights reserved.  Butterfly, Supine    Lie on back, feet together. Lower knees toward floor. Hold 60___ seconds. Repeat _2__ times per session. Do _1__ sessions per day.  Copyright  VHI. All rights reserved.

## 2017-05-06 ENCOUNTER — Ambulatory Visit: Payer: PRIVATE HEALTH INSURANCE | Admitting: Physical Therapy

## 2017-05-06 DIAGNOSIS — M6281 Muscle weakness (generalized): Secondary | ICD-10-CM

## 2017-05-06 DIAGNOSIS — R252 Cramp and spasm: Secondary | ICD-10-CM | POA: Diagnosis not present

## 2017-05-06 NOTE — Therapy (Signed)
[facility] [address], [place], [place], [address] Phone: [phone]   Fax:  [phone]  Physical Therapy Treatment  Patient Details  Name: Virginia [name] MRN: [id number] Date of Birth: June 08, 1980 Referring Provider: Chauncey Cruel   Encounter Date: 05/06/2017  PT End of Session - 05/06/17 1715    Visit Number  3    Date for PT Re-Evaluation  06/03/17    PT Start Time  1448    PT Stop Time  1614    PT Time Calculation (min)  44 min    Activity Tolerance  Patient tolerated treatment well    Behavior During Therapy  [facility] for tasks assessed/performed       Past Medical History:  Diagnosis Date  . Anemia    as a child  . Anxiety   . Cholestasis   . Elevated liver enzymes    during pregnancy March 2015  . Family history of breast cancer   . Family history of prostate cancer   . GERD (gastroesophageal reflux disease)    one episode  . Headache   . History of radiation therapy 06/11/16- 07/23/16   Left Breast 50 Gy in 25 fractions, Left Breast boost 10 Gy in 5 fractions.   . Hypothyroidism   . Lymphedema of arm    LEFT  . Malignant neoplasm of upper-inner quadrant of left female breast ([place]) 01/16/2016    Past Surgical History:  Procedure Laterality Date  . BREAST LUMPECTOMY WITH RADIOACTIVE SEED AND SENTINEL LYMPH NODE BIOPSY Left 03/13/2016   Procedure: LEFT BREAST LUMPECTOMY WITH RADIOACTIVE SEED X 2 AND SENTINEL LYMPH NODE BIOPSY;  Surgeon: Stark Klein, MD;  Location: [place];  Service: General;  Laterality: Left;  . NO PAST SURGERIES    . PORT-A-CATH REMOVAL N/A 04/16/2017   Procedure: REMOVAL PORT-A-CATH;  Surgeon: Stark Klein, MD;  Location: [place];  Service: General;  Laterality: N/A;  . PORTACATH PLACEMENT Right 03/13/2016   Procedure: INSERTION PORT-A-CATH;  Surgeon: Stark Klein, MD;  Location: [place];  Service: General;  Laterality: Right;    There were no vitals filed for this  visit.  Subjective Assessment - 05/06/17 1714    Subjective  The epson salt bath helped and stretches feel good.  The massage is irritating and was painful after the previous visit.      Patient Stated Goals  get rid of discomfort in sitting and be able to have intercourse    Currently in Pain?  No/denies                      [facility] Adult PT Treatment/Exercise - 05/06/17 0001      Lumbar Exercises: Seated   Other Seated Lumbar Exercises  thoracic extension and rotation in chair      Lumbar Exercises: Supine   Other Supine Lumbar Exercises  lying on foam roller thoracic extension and pec strech, towel roll horizontal      Manual Therapy   Soft tissue mobilization  thoracic extension mobs T4-8; paraspinals soft tissue mobs T1-L5, thoracolumbar fascia release,              PT Education - 05/06/17 1642    Education provided  Yes    Education Details  thoracic ext and rotation in sitting    Person(s) Educated  Patient    Methods  Explanation;Demonstration    Comprehension  Verbalized understanding;Returned demonstration       PT Short Term  Goals - 04/29/17 1815      PT SHORT TERM GOAL #1   Title  ind with self massage and using moisturizers for improved tissue quality    Time  2    Period  Weeks    Status  On-going      PT SHORT TERM GOAL #2   Title  ind with pelvic floor stretches    Baseline  learned today    Time  4    Period  Weeks    Status  On-going        PT Long Term Goals - 05/06/17 1725      PT LONG TERM GOAL #1   Title  pt will report 75% less pain in sitting due to improved soft tissue quality    Time  8    Period  Weeks    Status  On-going      PT LONG TERM GOAL #2   Title  pt will be ind with advanced HEP    Time  8    Period  Weeks    Status  On-going      PT LONG TERM GOAL #3   Title  pt will report 1/3 on Marinoff scale    Time  8    Period  Weeks    Status  On-going      PT LONG TERM GOAL #4   Title  pt will have  2/5 MMT of pelvic floor and able to hold for at lease 5 seconds for improved bladder control    Time  8    Period  Weeks    Status  On-going            Plan - 05/06/17 1717    Clinical Impression Statement  Pt was educated on exercises to increase thoracic extension and rotation for improved diaphragmatic breathing.  Pt tolerated treatment well today.  She has muscle spasms throughout thoracic paraspinals especialy on left side and around T8-11.  Pt was educated in chest opening stretches and able to perform correctly. pt will benefit from skilled PT to continue working on improved soft tissue length, ROM for pain management of pelvic floor symptoms.    PT Treatment/Interventions  ADLs/Self Care Home Management;Patient/family education;Manual techniques;Manual lymph drainage;Compression bandaging;Scar mobilization;Taping;Biofeedback;Cryotherapy;Electrical Stimulation;Moist Heat;Therapeutic activities;Therapeutic exercise;Neuromuscular re-education;Passive range of motion;Dry needling    PT Next Visit Plan  thoracic and lumbar ROM and stretch, review with towel and foam roll, breathing and bulging pelvic floor in different positons as tolerated, internal STM if tolerated    Consulted and Agree with Plan of Care  Patient       Patient will benefit from skilled therapeutic intervention in order to improve the following deficits and impairments:  Pain, Increased muscle spasms, Increased fascial restricitons, Decreased range of motion, Decreased strength  Visit Diagnosis: Cramp and spasm  Muscle weakness (generalized)     Problem List Patient Active Problem List   Diagnosis Date Noted  . Port-A-Cath in place 02/27/2017  . Headache disorder 09/26/2016  . Genetic testing 02/26/2016  . Family history of breast cancer   . Family history of prostate cancer   . Malignant neoplasm of upper-inner quadrant of left breast in female, estrogen receptor positive ([place]) 01/16/2016  . Postpartum  state 05/20/2013  . Cholelithiasis complicating pregnancy, antepartum 05/18/2013  . Cholestasis of pregnancy 04/28/2013    Zannie Cove, PT 05/06/2017, 5:34 PM  [facility] [address], [place], [place],  [address] Phone: [phone]   Fax:  [phone]  Name: Virginia [name] MRN: [id number] Date of Birth: [birth year]

## 2017-05-06 NOTE — Patient Instructions (Signed)
   Thoracic Rotation  Sit in chair with feet flat on floor.  Rotate left and right at comfortable speed until stretch is felt. Make sure you stay upright during entire exercise.  Hold 10 sec repeat 5x each side     Thoracic Extension  Sitting on a chair with the top hitting your mid back, place your hands crossed on your shoulders.  Slowly tilt back so you are stretching your mid back.  Hold for 20-30 seconds.  Release and repeat 5x/day   [facility] [address] [place], [place] [address] Phone # [phone] Fax [phone]

## 2017-05-12 ENCOUNTER — Inpatient Hospital Stay: Payer: PRIVATE HEALTH INSURANCE | Attending: Adult Health

## 2017-05-12 VITALS — BP 145/90 | HR 77 | Temp 97.8°F | Resp 20

## 2017-05-12 DIAGNOSIS — Z170 Estrogen receptor positive status [ER+]: Secondary | ICD-10-CM | POA: Insufficient documentation

## 2017-05-12 DIAGNOSIS — Z803 Family history of malignant neoplasm of breast: Secondary | ICD-10-CM | POA: Diagnosis not present

## 2017-05-12 DIAGNOSIS — Z79810 Long term (current) use of selective estrogen receptor modulators (SERMs): Secondary | ICD-10-CM | POA: Insufficient documentation

## 2017-05-12 DIAGNOSIS — Z79899 Other long term (current) drug therapy: Secondary | ICD-10-CM | POA: Diagnosis not present

## 2017-05-12 DIAGNOSIS — F419 Anxiety disorder, unspecified: Secondary | ICD-10-CM | POA: Diagnosis not present

## 2017-05-12 DIAGNOSIS — G629 Polyneuropathy, unspecified: Secondary | ICD-10-CM | POA: Insufficient documentation

## 2017-05-12 DIAGNOSIS — C50212 Malignant neoplasm of upper-inner quadrant of left female breast: Secondary | ICD-10-CM | POA: Insufficient documentation

## 2017-05-12 DIAGNOSIS — I714 Abdominal aortic aneurysm, without rupture: Secondary | ICD-10-CM | POA: Diagnosis not present

## 2017-05-12 DIAGNOSIS — Z95828 Presence of other vascular implants and grafts: Secondary | ICD-10-CM

## 2017-05-12 MED ORDER — GOSERELIN ACETATE 3.6 MG ~~LOC~~ IMPL
10.8000 mg | DRUG_IMPLANT | Freq: Once | SUBCUTANEOUS | Status: AC
  Administered 2017-05-12: 10.8 mg via SUBCUTANEOUS

## 2017-05-12 NOTE — Patient Instructions (Signed)
Goserelin injection What is this medicine? GOSERELIN (GOE se rel in) is similar to a hormone found in the body. It lowers the amount of sex hormones that the body makes. Men will have lower testosterone levels and women will have lower estrogen levels while taking this medicine. In men, this medicine is used to treat prostate cancer; the injection is either given once per month or once every 12 weeks. A once per month injection (only) is used to treat women with endometriosis, dysfunctional uterine bleeding, or advanced breast cancer. This medicine may be used for other purposes; ask your health care provider or pharmacist if you have questions. COMMON BRAND NAME(S): Zoladex What should I tell my health care provider before I take this medicine? They need to know if you have any of these conditions (some only apply to women): -diabetes -heart disease or previous heart attack -high blood pressure -high cholesterol -kidney disease -osteoporosis or low bone density -problems passing urine -spinal cord injury -stroke -tobacco smoker -an unusual or allergic reaction to goserelin, hormone therapy, other medicines, foods, dyes, or preservatives -pregnant or trying to get pregnant -breast-feeding How should I use this medicine? This medicine is for injection under the skin. It is given by a health care professional in a hospital or clinic setting. Men receive this injection once every 4 weeks or once every 12 weeks. Women will only receive the once every 4 weeks injection. Talk to your pediatrician regarding the use of this medicine in children. Special care may be needed. Overdosage: If you think you have taken too much of this medicine contact a poison control center or emergency room at once. NOTE: This medicine is only for you. Do not share this medicine with others. What if I miss a dose? It is important not to miss your dose. Call your doctor or health care professional if you are unable to  keep an appointment. What may interact with this medicine? -female hormones like estrogen -herbal or dietary supplements like black cohosh, chasteberry, or DHEA -female hormones like testosterone -prasterone This list may not describe all possible interactions. Give your health care provider a list of all the medicines, herbs, non-prescription drugs, or dietary supplements you use. Also tell them if you smoke, drink alcohol, or use illegal drugs. Some items may interact with your medicine. What should I watch for while using this medicine? Visit your doctor or health care professional for regular checks on your progress. Your symptoms may appear to get worse during the first weeks of this therapy. Tell your doctor or healthcare professional if your symptoms do not start to get better or if they get worse after this time. Your bones may get weaker if you take this medicine for a long time. If you smoke or frequently drink alcohol you may increase your risk of bone loss. A family history of osteoporosis, chronic use of drugs for seizures (convulsions), or corticosteroids can also increase your risk of bone loss. Talk to your doctor about how to keep your bones strong. This medicine should stop regular monthly menstration in women. Tell your doctor if you continue to menstrate. Women should not become pregnant while taking this medicine or for 12 weeks after stopping this medicine. Women should inform their doctor if they wish to become pregnant or think they might be pregnant. There is a potential for serious side effects to an unborn child. Talk to your health care professional or pharmacist for more information. Do not breast-feed an infant while taking   this medicine. Men should inform their doctors if they wish to father a child. This medicine may lower sperm counts. Talk to your health care professional or pharmacist for more information. What side effects may I notice from receiving this  medicine? Side effects that you should report to your doctor or health care professional as soon as possible: -allergic reactions like skin rash, itching or hives, swelling of the face, lips, or tongue -bone pain -breathing problems -changes in vision -chest pain -feeling faint or lightheaded, falls -fever, chills -pain, swelling, warmth in the leg -pain, tingling, numbness in the hands or feet -signs and symptoms of low blood pressure like dizziness; feeling faint or lightheaded, falls; unusually weak or tired -stomach pain -swelling of the ankles, feet, hands -trouble passing urine or change in the amount of urine -unusually high or low blood pressure -unusually weak or tired Side effects that usually do not require medical attention (report to your doctor or health care professional if they continue or are bothersome): -change in sex drive or performance -changes in breast size in both males and females -changes in emotions or moods -headache -hot flashes -irritation at site where injected -loss of appetite -skin problems like acne, dry skin -vaginal dryness This list may not describe all possible side effects. Call your doctor for medical advice about side effects. You may report side effects to FDA at 1-800-FDA-1088. Where should I keep my medicine? This drug is given in a hospital or clinic and will not be stored at home. NOTE: This sheet is a summary. It may not cover all possible information. If you have questions about this medicine, talk to your doctor, pharmacist, or health care provider.  2018 [employer] Standard (2013-04-19 11:10:35)  

## 2017-05-13 ENCOUNTER — Encounter: Payer: Self-pay | Admitting: Physical Therapy

## 2017-05-13 ENCOUNTER — Ambulatory Visit: Payer: PRIVATE HEALTH INSURANCE | Admitting: Physical Therapy

## 2017-05-13 DIAGNOSIS — M6281 Muscle weakness (generalized): Secondary | ICD-10-CM

## 2017-05-13 DIAGNOSIS — R252 Cramp and spasm: Secondary | ICD-10-CM

## 2017-05-13 NOTE — Therapy (Signed)
[facility] [address], [place], [place], [address] Phone: [phone]   Fax:  [phone]  Physical Therapy Treatment  Patient Details  Name: Virginia [name] MRN: [id number] Date of Birth: [birth year] Referring Provider: Chauncey Cruel   Encounter Date: 05/13/2017  PT End of Session - 05/13/17 1628    Visit Number  4    Date for PT Re-Evaluation  06/03/17    PT Start Time  1624 pt arrived late    PT Stop Time  1702    PT Time Calculation (min)  38 min    Activity Tolerance  Patient tolerated treatment well    Behavior During Therapy  [facility] for tasks assessed/performed       Past Medical History:  Diagnosis Date  . Anemia    as a child  . Anxiety   . Cholestasis   . Elevated liver enzymes    during pregnancy March 2015  . Family history of breast cancer   . Family history of prostate cancer   . GERD (gastroesophageal reflux disease)    one episode  . Headache   . History of radiation therapy 06/11/16- 07/23/16   Left Breast 50 Gy in 25 fractions, Left Breast boost 10 Gy in 5 fractions.   . Hypothyroidism   . Lymphedema of arm    LEFT  . Malignant neoplasm of upper-inner quadrant of left female breast ([place]) 01/16/2016    Past Surgical History:  Procedure Laterality Date  . BREAST LUMPECTOMY WITH RADIOACTIVE SEED AND SENTINEL LYMPH NODE BIOPSY Left 03/13/2016   Procedure: LEFT BREAST LUMPECTOMY WITH RADIOACTIVE SEED X 2 AND SENTINEL LYMPH NODE BIOPSY;  Surgeon: Stark Klein, MD;  Location: [place];  Service: General;  Laterality: Left;  . NO PAST SURGERIES    . PORT-A-CATH REMOVAL N/A 04/16/2017   Procedure: REMOVAL PORT-A-CATH;  Surgeon: Stark Klein, MD;  Location: [place];  Service: General;  Laterality: N/A;  . PORTACATH PLACEMENT Right 03/13/2016   Procedure: INSERTION PORT-A-CATH;  Surgeon: Stark Klein, MD;  Location: [place];  Service: General;  Laterality: Right;    There were no vitals filed for  this visit.  Subjective Assessment - 05/13/17 1738    Subjective  I haven't had any pain when sitting.  I haven't done the self massage.  I did a little externally and it was sore.    Patient Stated Goals  get rid of discomfort in sitting and be able to have intercourse    Currently in Pain?  No/denies                      [facility] Adult PT Treatment/Exercise - 05/13/17 0001      Manual Therapy   Manual Therapy  Joint mobilization    Soft tissue mobilization  calves, hamstrings, glutes, thoracic and lumbar paraspinals      A/P and gentle rotational joint mobs to T4-10         PT Short Term Goals - 05/13/17 1739      PT SHORT TERM GOAL #1   Title  ind with self massage and using moisturizers for improved tissue quality    Baseline  not using internally but able to do    Time  2    Period  Weeks    Status  Achieved      PT SHORT TERM GOAL #2   Title  ind with pelvic floor stretches    Time  4  Period  Weeks    Status  Achieved        PT Long Term Goals - 05/06/17 1725      PT LONG TERM GOAL #1   Title  pt will report 75% less pain in sitting due to improved soft tissue quality    Time  8    Period  Weeks    Status  On-going      PT LONG TERM GOAL #2   Title  pt will be ind with advanced HEP    Time  8    Period  Weeks    Status  On-going      PT LONG TERM GOAL #3   Title  pt will report 1/3 on Marinoff scale    Time  8    Period  Weeks    Status  On-going      PT LONG TERM GOAL #4   Title  pt will have 2/5 MMT of pelvic floor and able to hold for at lease 5 seconds for improved bladder control    Time  8    Period  Weeks    Status  On-going            Plan - 05/13/17 1709    Clinical Impression Statement  Pt has fascial restriction and tight muscles throughout posterior chain.  She demonstrated some reduction of tenderness and increased muscle length after treatment.  Pt is overall feeling better with less pain in sitting.  She  will benefit from skilled PT to work on upper back mobilty and immproved soft tissue length    PT Treatment/Interventions  ADLs/Self Care Home Management;Patient/family education;Manual techniques;Manual lymph drainage;Compression bandaging;Scar mobilization;Taping;Biofeedback;Cryotherapy;Electrical Stimulation;Moist Heat;Therapeutic activities;Therapeutic exercise;Neuromuscular re-education;Passive range of motion;Dry needling    PT Next Visit Plan  f/u on STM, cont thoracic and lumbar ROM and stretch, review with towel and foam roll, breathing and bulging pelvic floor in different positons as tolerated, internal STM if tolerated    Consulted and Agree with Plan of Care  Patient       Patient will benefit from skilled therapeutic intervention in order to improve the following deficits and impairments:  Pain, Increased muscle spasms, Increased fascial restricitons, Decreased range of motion, Decreased strength  Visit Diagnosis: Cramp and spasm  Muscle weakness (generalized)     Problem List Patient Active Problem List   Diagnosis Date Noted  . Port-A-Cath in place 02/27/2017  . Headache disorder 09/26/2016  . Genetic testing 02/26/2016  . Family history of breast cancer   . Family history of prostate cancer   . Malignant neoplasm of upper-inner quadrant of left breast in female, estrogen receptor positive ([place]) 01/16/2016  . Postpartum state 05/20/2013  . Cholelithiasis complicating pregnancy, antepartum 05/18/2013  . Cholestasis of pregnancy 04/28/2013    Zannie Cove, PT 05/13/2017, 5:41 PM  [facility] [address], [place], [place], [address] Phone: [phone]   Fax:  [phone]  Name: AISSATOU FRONCZAK MRN: [id number] Date of Birth: [birth year]

## 2017-05-20 ENCOUNTER — Ambulatory Visit: Payer: PRIVATE HEALTH INSURANCE | Admitting: Physical Therapy

## 2017-05-20 DIAGNOSIS — R252 Cramp and spasm: Secondary | ICD-10-CM | POA: Diagnosis not present

## 2017-05-20 DIAGNOSIS — M6281 Muscle weakness (generalized): Secondary | ICD-10-CM

## 2017-05-20 NOTE — Therapy (Addendum)
[facility] [address] [place], [place], [address] Phone: [phone]   Fax:  [phone]  Physical Therapy Treatment  Patient Details  Name: Virginia [name] MRN: [id number] Date of Birth: October 20, 1980 Referring Provider: Chauncey Cruel   Encounter Date: 05/20/2017  PT End of Session - 05/20/17 1701    Visit Number  5    Date for PT Re-Evaluation  06/03/17    PT Start Time  1619    PT Stop Time  1659    PT Time Calculation (min)  40 min    Activity Tolerance  Patient tolerated treatment well    Behavior During Therapy  [facility] for tasks assessed/performed       Past Medical History:  Diagnosis Date  . Anemia    as a child  . Anxiety   . Cholestasis   . Elevated liver enzymes    during pregnancy March 2015  . Family history of breast cancer   . Family history of prostate cancer   . GERD (gastroesophageal reflux disease)    one episode  . Headache   . History of radiation therapy 06/11/16- 07/23/16   Left Breast 50 Gy in 25 fractions, Left Breast boost 10 Gy in 5 fractions.   . Hypothyroidism   . Lymphedema of arm    LEFT  . Malignant neoplasm of upper-inner quadrant of left female breast ([place]) 01/16/2016    Past Surgical History:  Procedure Laterality Date  . BREAST LUMPECTOMY WITH RADIOACTIVE SEED AND SENTINEL LYMPH NODE BIOPSY Left 03/13/2016   Procedure: LEFT BREAST LUMPECTOMY WITH RADIOACTIVE SEED X 2 AND SENTINEL LYMPH NODE BIOPSY;  Surgeon: Stark Klein, MD;  Location: [place];  Service: General;  Laterality: Left;  . NO PAST SURGERIES    . PORT-A-CATH REMOVAL N/A 04/16/2017   Procedure: REMOVAL PORT-A-CATH;  Surgeon: Stark Klein, MD;  Location: [place];  Service: General;  Laterality: N/A;  . PORTACATH PLACEMENT Right 03/13/2016   Procedure: INSERTION PORT-A-CATH;  Surgeon: Stark Klein, MD;  Location: [place];  Service: General;  Laterality: Right;    There were no vitals filed for this  visit.  Subjective Assessment - 05/20/17 1709    Subjective  I felt more energy and was able to go for a hike after the previous treatment.  i am still doing the massage but still feel sore when I am sitting after doing the perineal massage    Pertinent History  breast cancer estrogen receptor positive    Patient Stated Goals  get rid of discomfort in sitting and be able to have intercourse    Currently in Pain?  No/denies                No data recorded       [facility] Adult PT Treatment/Exercise - 05/20/17 0001      Self-Care   Other Self-Care Comments   self massage with towel roll in sitting, rolling LE, educated in HEP as seen in chart      Manual Therapy   Soft tissue mobilization  calves, hamstrings, glutes, thoracic and lumbar paraspinals             PT Education - 05/20/17 1701    Education provided  Yes    Education Details  calf stretch, LE against wall, sitting on towel roll    Person(s) Educated  Patient    Methods  Explanation;Demonstration;Handout;Verbal cues    Comprehension  Verbalized understanding;Returned demonstration  PT Short Term Goals - 05/13/17 1739      PT SHORT TERM GOAL #1   Title  ind with self massage and using moisturizers for improved tissue quality    Baseline  not using internally but able to do    Time  2    Period  Weeks    Status  Achieved      PT SHORT TERM GOAL #2   Title  ind with pelvic floor stretches    Time  4    Period  Weeks    Status  Achieved        PT Long Term Goals - 05/06/17 1725      PT LONG TERM GOAL #1   Title  pt will report 75% less pain in sitting due to improved soft tissue quality    Time  8    Period  Weeks    Status  On-going      PT LONG TERM GOAL #2   Title  pt will be ind with advanced HEP    Time  8    Period  Weeks    Status  On-going      PT LONG TERM GOAL #3   Title  pt will report 1/3 on Marinoff scale    Time  8    Period  Weeks    Status  On-going      PT  LONG TERM GOAL #4   Title  pt will have 2/5 MMT of pelvic floor and able to hold for at lease 5 seconds for improved bladder control    Time  8    Period  Weeks    Status  On-going            Plan - 05/20/17 1703    Clinical Impression Statement  Pt continues to have fascial restriction along posterior LE and muscle spasms throughout back.  Pt was educated on additional stretches and fascial release techniques to do at home  She is able to apply more pressure to self massage but is still sore afterwards  Pt will benefit from skilled PT to continue working on pain management and improved ROM.    PT Treatment/Interventions  ADLs/Self Care Home Management;Patient/family education;Manual techniques;Manual lymph drainage;Compression bandaging;Scar mobilization;Taping;Biofeedback;Cryotherapy;Electrical Stimulation;Moist Heat;Therapeutic activities;Therapeutic exercise;Neuromuscular re-education;Passive range of motion;Dry needling    PT Next Visit Plan  thoracic and lumbar ROM and stretches, educate on biofeedback and perform for pelvic floor bulging, soft foam roll for fascial release, sitting on pball bulging pelvic floor, external MFR to pelvic floor    Consulted and Agree with Plan of Care  Patient       Patient will benefit from skilled therapeutic intervention in order to improve the following deficits and impairments:  Pain, Increased muscle spasms, Increased fascial restricitons, Decreased range of motion, Decreased strength  Visit Diagnosis: Cramp and spasm  Muscle weakness (generalized)     Problem List Patient Active Problem List   Diagnosis Date Noted  . Port-A-Cath in place 02/27/2017  . Headache disorder 09/26/2016  . Genetic testing 02/26/2016  . Family history of breast cancer   . Family history of prostate cancer   . Malignant neoplasm of upper-inner quadrant of left breast in female, estrogen receptor positive ([place]) 01/16/2016  . Postpartum state 05/20/2013  .  Cholelithiasis complicating pregnancy, antepartum 05/18/2013  . Cholestasis of pregnancy 04/28/2013    Zannie Cove, PT 05/20/2017, 5:10 PM  [facility] [address],  [place], [place], [address] Phone: [phone]   Fax:  [phone]  Name: Virginia [name] MRN: [id number] Date of Birth: [birth year]  PHYSICAL THERAPY DISCHARGE SUMMARY  Visits from Start of Care: 5  Current functional level related to goals / functional outcomes: See above   Remaining deficits: See above   Education / Equipment: HEP  Plan: Patient agrees to discharge.  Patient goals were not met. Patient is being discharged due to not returning since the last visit.  ?????    [employer], PT 06/15/17 8:40 AM

## 2017-05-20 NOTE — Patient Instructions (Signed)
1. Sitting on towel roll - 1 min on each side - do 3x/day  2. Lying on back - legs on the wall - straight, legs out to the side, feet together knees out  3. Calf Stretch    Place one leg forward, bent, other leg behind and straight. Lean forward keeping back heel flat. Hold __30__ seconds while counting out loud. Repeat with other leg forward. Repeat _3___ times. Do __2__ sessions per day.  http://gt2.exer.us/478   Copyright  VHI. All rights reserved.   [place] [address] [place], [place] [address] Phone # [phone] Fax [phone]

## 2017-06-24 ENCOUNTER — Telehealth: Payer: Self-pay

## 2017-06-24 NOTE — Telephone Encounter (Signed)
LVM for patient reminding of SCP visit with NP on 07/03/17 at 2 pm.  Left center number for back if any questions.

## 2017-07-01 NOTE — Progress Notes (Signed)
CLINIC:  Survivorship   REASON FOR VISIT:  Routine follow-up post-treatment for a recent history of breast cancer.  BRIEF ONCOLOGIC HISTORY:    Malignant neoplasm of upper-inner quadrant of left breast in female, estrogen receptor positive ([facility])   01/15/2016 Initial Biopsy    Left breast upper inner quadrant biopsy: IDC, ER/PR positive, Ki-67 5%, HER-2 positive (ratio 2.41).  One of 2 areas of abnormal calcifications, more posterior one, was biopsied and showed atypical ductal hyperplasia, and a 4.1cm area of non mass like enhancement in the left breat underwent biopsy showing fibrocystic changes.       01/16/2016 Initial Diagnosis    Malignant neoplasm of upper-inner quadrant of left female breast ([facility])      12/2015 -  Anti-estrogen oral therapy    Tamoxifen daily, held during chemotherapy and radiation, and resumed on 07/2016      02/22/2016 Genetic Testing    Patient has genetic testing done for breast cancer. Negative genetic testing on a custom breast cancer panel.  Negative genetic testing for the MSH2 inversion analysis ([place] inversion). The Custom gene panel offered by [facility] includes sequencing and rearrangement analysis for the following 21 genes:  ATM, BARD1, BRCA1, BRCA2, BRIP1, CDH1, CHEK2, EPCAM, FANCC, HOXB13, MLH1, MSH2, MSH6, NBN, PALB2, PMS2, PTEN, RAD51C, RAD51D, TP53, and XRCC2.   The report date is February 22, 2016.      03/13/2016 Surgery    Left lumpectomy and SLNB: IDC, grade 2, multiple foci, 1.3 cm, 0.8 cm, and 0.5 cm, margins negative, 2 SLN negative      04/10/2016 - 04/10/2017 Chemotherapy    Paclitaxel weekly and Trastuzumab every 3 weeks.  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  Completed one year of maintenance Trastuzumab       06/11/2016 - 07/23/2016 Radiation Therapy    1) Left Breast / 50 Gy in 25 fractions.  2) Left Breast Boost / 10 Gy in 5 fractions.        05/2016 Miscellaneous    Due to resumed menses, Goserelin  started, initially given every 4 weeks, however changed to every 12 weeks with estradiol monitoring.         INTERVAL HISTORY:  Ms. Pollio presents to the [facility] today for our initial meeting to review her survivorship care plan detailing her treatment course for breast cancer, as well as monitoring long-term side effects of that treatment, education regarding health maintenance, screening, and overall wellness and health promotion.     Overall, Ms. Brigandi reports feeling quite well today.  She is taking Tamoxifen daily and is tolerating it well.  She denies any issues today.  She has had some breast lymphedema to her left breast, and she has undergone PT and has a compression bra for this.  She doesn't note any lymphedema in her arm.  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  Today, she has no issues with neuropathy.    REVIEW OF SYSTEMS:  Review of Systems  Constitutional: Negative for appetite change, chills, fatigue, fever and unexpected weight change.  HENT:   Negative for hearing loss, lump/mass and trouble swallowing.   Eyes: Negative for eye problems and icterus.  Respiratory: Negative for chest tightness, cough and shortness of breath.   Cardiovascular: Negative for chest pain, leg swelling and palpitations.  Gastrointestinal: Negative for abdominal distention, abdominal pain, constipation, diarrhea, nausea and vomiting.  Skin: Negative for itching and rash.  Neurological: Negative for dizziness, extremity weakness and numbness.  Hematological: Negative for adenopathy. Does not bruise/bleed easily.  Psychiatric/Behavioral: Negative for depression. The patient is not nervous/anxious.   Breast: Denies any new nodularity, masses, tenderness, nipple changes, or nipple discharge.      ONCOLOGY TREATMENT TEAM:  1. Surgeon:  Dr. Byerly at [facility] 2. Medical Oncologist: Dr. Magrinat  3. Radiation Oncologist: Dr. Squire    PAST  MEDICAL/SURGICAL HISTORY:  Past Medical History:  Diagnosis Date  . Anemia    as a child  . Anxiety   . Cholestasis   . Elevated liver enzymes    during pregnancy March 2015  . Family history of breast cancer   . Family history of prostate cancer   . GERD (gastroesophageal reflux disease)    one episode  . Headache   . History of radiation therapy 06/11/16- 07/23/16   Left Breast 50 Gy in 25 fractions, Left Breast boost 10 Gy in 5 fractions.   . Hypothyroidism   . Lymphedema of arm    LEFT  . Malignant neoplasm of upper-inner quadrant of left female breast ([facility]) 01/16/2016   Past Surgical History:  Procedure Laterality Date  . BREAST LUMPECTOMY WITH RADIOACTIVE SEED AND SENTINEL LYMPH NODE BIOPSY Left 03/13/2016   Procedure: LEFT BREAST LUMPECTOMY WITH RADIOACTIVE SEED X 2 AND SENTINEL LYMPH NODE BIOPSY;  Surgeon: Faera Byerly, MD;  Location: [facility] OR;  Service: General;  Laterality: Left;  . NO PAST SURGERIES    . PORT-A-CATH REMOVAL N/A 04/16/2017   Procedure: REMOVAL PORT-A-CATH;  Surgeon: Byerly, Faera, MD;  Location: [facility] OR;  Service: General;  Laterality: N/A;  . PORTACATH PLACEMENT Right 03/13/2016   Procedure: INSERTION PORT-A-CATH;  Surgeon: Faera Byerly, MD;  Location: [facility] OR;  Service: General;  Laterality: Right;     ALLERGIES:  Allergies  Allergen Reactions  . Amoxicillin Diarrhea and Nausea Only    Has patient had a PCN reaction causing immediate rash, facial/tongue/throat swelling, SOB or lightheadedness with hypotension: No Has patient had a PCN reaction causing severe rash involving mucus membranes or skin necrosis: No Has patient had a PCN reaction that required hospitalization No Has patient had a PCN reaction occurring within the last 10 years: Yes If all of the above answers are "NO", then may proceed with Cephalosporin use.      CURRENT MEDICATIONS:  Outpatient Encounter Medications as of 07/03/2017  Medication Sig Note  . acetaminophen (TYLENOL) 500 MG  tablet Take 500 mg by mouth every 6 (six) hours as needed for headache.    . b complex vitamins tablet Take 1 tablet by mouth daily.   . butalbital-acetaminophen-caffeine (FIORICET, ESGIC) 50-325-40 MG tablet Take 1 tablet by mouth every 6 (six) hours as needed for headache.   . goserelin (ZOLADEX) 10.8 MG injection Inject 10.8 mg into the skin every 3 (three) months. She is not sure of the exact dose  04/01/2017: Receives at [place]  . ibuprofen (ADVIL,MOTRIN) 200 MG tablet Take 400 mg by mouth every 8 (eight) hours as needed for mild pain.  03/05/2016: rarely  . Multiple Vitamins-Minerals (MULTIVITAMIN GUMMIES ADULT PO) Take 2 tablets by mouth daily. 03/05/2016: occasionally  . tamoxifen (NOLVADEX) 20 MG tablet TAKE 1 TABLET DAILY BY MOUTH.   . [DISCONTINUED] prochlorperazine (COMPAZINE) 10 MG tablet Take 1 tablet (10 mg total) by mouth every 6 (six) hours as needed (Nausea or vomiting). (Patient not taking: Reported on 06/03/2016)    No facility-administered encounter medications on file as of 07/03/2017.      ONCOLOGIC   FAMILY HISTORY:  Family History  Problem Relation Age of Onset  . Hypertension Mother   . Hypertension Father   . Prostate cancer Father   . Liver cancer Maternal Grandmother   . Breast cancer Paternal Grandmother        dx in her 40s  . Lung cancer Paternal Aunt        smoker  . Heart attack Paternal Grandfather   . Liver cancer Other      GENETIC COUNSELING/TESTING: See above  SOCIAL HISTORY:  Social History   Socioeconomic History  . Marital status: Married    Spouse name: Not on file  . Number of children: 2  . Years of education: Not on file  . Highest education level: Not on file  Occupational History  . Not on file  Social Needs  . Financial resource strain: Not on file  . Food insecurity:    Worry: Not on file    Inability: Not on file  . Transportation needs:    Medical: Not on file    Non-medical: Not on file  Tobacco Use  . Smoking  status: Never Smoker  . Smokeless tobacco: Never Used  Substance and Sexual Activity  . Alcohol use: No  . Drug use: No  . Sexual activity: Not Currently    Birth control/protection: Condom  Lifestyle  . Physical activity:    Days per week: Not on file    Minutes per session: Not on file  . Stress: Not on file  Relationships  . Social connections:    Talks on phone: Not on file    Gets together: Not on file    Attends religious service: Not on file    Active member of club or organization: Not on file    Attends meetings of clubs or organizations: Not on file    Relationship status: Not on file  . Intimate partner violence:    Fear of current or ex partner: Not on file    Emotionally abused: Not on file    Physically abused: Not on file    Forced sexual activity: Not on file  Other Topics Concern  . Not on file  Social History Narrative  . Not on file      PHYSICAL EXAMINATION:  Vital Signs:   Vitals:   07/03/17 1416  BP: 113/77  Pulse: 84  Resp: 20  Temp: 98.4 F (36.9 C)  SpO2: 98%   Filed Weights   07/03/17 1416  Weight: 198 lb 8 oz (90 kg)   General: Well-nourished, well-appearing female in no acute distress.  She is unaccompanied today.   HEENT: Head is normocephalic.  Pupils equal and reactive to light. Conjunctivae clear without exudate.  Sclerae anicteric. Oral mucosa is pink, moist.  Oropharynx is pink without lesions or erythema.  Lymph: No cervical, supraclavicular, or infraclavicular lymphadenopathy noted on palpation.  Cardiovascular: Regular rate and rhythm.. Respiratory: Clear to auscultation bilaterally. Chest expansion symmetric; breathing non-labored.  Breasts: left breast s/p lumpectomy and radiation, mild amt of swelling present, no nodules, masses or sign of recurrence noted, right breast without nodules, masses, skin or nipple changes.   GI: Abdomen soft and round; non-tender, non-distended. Bowel sounds normoactive.  GU: Deferred.    Neuro: No focal deficits. Steady gait.  Psych: Mood and affect normal and appropriate for situation.  Extremities: No edema. MSK: No focal spinal tenderness to palpation.  Full range of motion in bilateral upper extremities Skin: Warm and dry.  LABORATORY DATA:    None for this visit.  DIAGNOSTIC IMAGING:  None for this visit.      ASSESSMENT AND PLAN:  Ms.. Smigel is a pleasant 37 y.o. female with Stage IA left breast invasive ductal carcinoma, ER+/PR+/HER2-, diagnosed in 12/2015, treated with lumpectomy, adjuvant chemotherapy, adjuvant radiation therapy, and anti-estrogen therapy with Tamoxifen reusming in 07/2016.  She also receives Goserelin every 12 weeks.  She presents to the Survivorship Clinic for our initial meeting and routine follow-up post-completion of treatment for breast cancer.    1. Stage IA left breast cancer:  Ms. Mcenaney is continuing to recover from definitive treatment for breast cancer. She will follow-up with her medical oncologist, Dr. Jana Hakim in 3 months with her next Goserelin injection history and physical exam per surveillance protocol.  She will continue her anti-estrogen therapy with Tamoxifen. Thus far, she is tolerating the Tamoxifen well, with minimal side effects. Today, a comprehensive survivorship care plan and treatment summary was reviewed with the patient today detailing her breast cancer diagnosis, treatment course, potential late/long-term effects of treatment, appropriate follow-up care with recommendations for the future, and patient education resources.  A copy of this summary, along with a letter will be sent to the patient's primary care provider via mail/fax/In Basket message after today's visit.    2. Breast lymphedema: Currently managed well with compression bra and PT recommendations.    3. Bone health:  I counseled Lashawn that Tamoxifen has a protective effect on the bones. She was given education on specific activities to promote bone  health in her survivorship care plan.  4. Cancer screening:  Due to Ms. Hammersmith's history and her age, she should receive screening for skin cancers, colon cancer, and gynecologic cancers.  The information and recommendations are listed on the patient's comprehensive care plan/treatment summary and were reviewed in detail with the patient.    5. Health maintenance and wellness promotion: Ms. Kewley was encouraged to consume 5-7 servings of fruits and vegetables per day. We reviewed the "Nutrition Rainbow" handout, as well as the handout "Take Control of Your Health and Reduce Your Cancer Risk" from the [facility].  She was also encouraged to engage in moderate to vigorous exercise for 30 minutes per day most days of the week. We discussed the [employer] fitness program, which is designed for cancer survivors to help them become more physically fit after cancer treatments.  She was instructed to limit her alcohol consumption and continue to abstain from tobacco use.     6. Support services/counseling: It is not uncommon for this period of the patient's cancer care trajectory to be one of many emotions and stressors.  We discussed an opportunity for her to participate in the next session of [facility] ("Finding Your New Normal") support group series designed for patients after they have completed treatment.   Ms. Skufca was encouraged to take advantage of our many other support services programs, support groups, and/or counseling in coping with her new life as a cancer survivor after completing anti-cancer treatment.  She was offered support today through active listening and expressive supportive counseling.  She was given information regarding our available services and encouraged to contact me with any questions or for help enrolling in any of our support group/programs.    Dispo:   -Return to cancer center in one week for lab and f/u with Dr. Jana Hakim -Mammogram due in 12/2017 -She  is welcome to return back to the Survivorship Clinic at any time; no additional follow-up needed at  this time.  -Consider referral back to survivorship as a long-term survivor for continued surveillance  A total of (30) minutes of face-to-face time was spent with this patient with greater than 50% of that time in counseling and care-coordination.   Gardenia Phlegm, NP Survivorship Program [facility] [phone]   Note: PRIMARY CARE PROVIDER [name], [place] [phone]

## 2017-07-03 ENCOUNTER — Inpatient Hospital Stay: Payer: PRIVATE HEALTH INSURANCE | Attending: Adult Health | Admitting: Adult Health

## 2017-07-03 ENCOUNTER — Telehealth: Payer: Self-pay | Admitting: Adult Health

## 2017-07-03 ENCOUNTER — Encounter: Payer: Self-pay | Admitting: Adult Health

## 2017-07-03 VITALS — BP 113/77 | HR 84 | Temp 98.4°F | Resp 20 | Ht 67.0 in | Wt 198.5 lb

## 2017-07-03 DIAGNOSIS — I890 Lymphedema, not elsewhere classified: Secondary | ICD-10-CM | POA: Diagnosis not present

## 2017-07-03 DIAGNOSIS — C50212 Malignant neoplasm of upper-inner quadrant of left female breast: Secondary | ICD-10-CM

## 2017-07-03 DIAGNOSIS — Z170 Estrogen receptor positive status [ER+]: Secondary | ICD-10-CM

## 2017-07-03 NOTE — Telephone Encounter (Signed)
Gave patient AVs and calendar of upcoming may and august appointments.

## 2017-07-07 ENCOUNTER — Telehealth: Payer: Self-pay | Admitting: *Deleted

## 2017-07-07 NOTE — Telephone Encounter (Signed)
This RN was notified that pt's injection scheduled for this Friday 5/17 would give pt zoladex 10.8 at 8 weeks vs 12. Last injection was March 19.  This RN attempted to contact pt to review above - obtained VM - message left that she will be contacted by scheduling per need to revise her injection schedule.  Message sent to scheduling per above.

## 2017-07-09 ENCOUNTER — Telehealth: Payer: Self-pay | Admitting: Oncology

## 2017-07-09 NOTE — Telephone Encounter (Signed)
Appointments scheduled letter/calendar mailed to patient per 5/14 sch [facility]

## 2017-07-10 ENCOUNTER — Inpatient Hospital Stay: Payer: PRIVATE HEALTH INSURANCE

## 2017-07-10 ENCOUNTER — Ambulatory Visit: Payer: PRIVATE HEALTH INSURANCE | Admitting: Oncology

## 2017-07-31 ENCOUNTER — Inpatient Hospital Stay: Payer: PRIVATE HEALTH INSURANCE

## 2017-07-31 ENCOUNTER — Inpatient Hospital Stay: Payer: PRIVATE HEALTH INSURANCE | Attending: Adult Health

## 2017-07-31 VITALS — BP 135/85 | HR 66 | Temp 98.3°F | Resp 18

## 2017-07-31 DIAGNOSIS — C50212 Malignant neoplasm of upper-inner quadrant of left female breast: Secondary | ICD-10-CM | POA: Diagnosis present

## 2017-07-31 DIAGNOSIS — Z5111 Encounter for antineoplastic chemotherapy: Secondary | ICD-10-CM | POA: Insufficient documentation

## 2017-07-31 DIAGNOSIS — Z95828 Presence of other vascular implants and grafts: Secondary | ICD-10-CM

## 2017-07-31 DIAGNOSIS — Z170 Estrogen receptor positive status [ER+]: Secondary | ICD-10-CM

## 2017-07-31 LAB — CBC WITH DIFFERENTIAL (CANCER CENTER ONLY)
Basophils Absolute: 0 10*3/uL (ref 0.0–0.1)
Basophils Relative: 1 %
Eosinophils Absolute: 0.1 10*3/uL (ref 0.0–0.5)
Eosinophils Relative: 2 %
HCT: 39.3 % (ref 34.8–46.6)
Hemoglobin: 12.7 g/dL (ref 11.6–15.9)
Lymphocytes Relative: 38 %
Lymphs Abs: 1.6 10*3/uL (ref 0.9–3.3)
MCH: 27.2 pg (ref 25.1–34.0)
MCHC: 32.2 g/dL (ref 31.5–36.0)
MCV: 84.4 fL (ref 79.5–101.0)
Monocytes Absolute: 0.5 10*3/uL (ref 0.1–0.9)
Monocytes Relative: 11 %
Neutro Abs: 2.1 10*3/uL (ref 1.5–6.5)
Neutrophils Relative %: 48 %
Platelet Count: 182 10*3/uL (ref 145–400)
RBC: 4.66 MIL/uL (ref 3.70–5.45)
RDW: 13.4 % (ref 11.2–14.5)
WBC Count: 4.3 10*3/uL (ref 3.9–10.3)

## 2017-07-31 LAB — CMP (CANCER CENTER ONLY)
ALT: 19 U/L (ref 0–55)
AST: 24 U/L (ref 5–34)
Albumin: 4.2 g/dL (ref 3.5–5.0)
Alkaline Phosphatase: 105 U/L (ref 40–150)
Anion gap: 9 (ref 3–11)
BUN: 9 mg/dL (ref 7–26)
CO2: 29 mmol/L (ref 22–29)
Calcium: 9.9 mg/dL (ref 8.4–10.4)
Chloride: 104 mmol/L (ref 98–109)
Creatinine: 0.79 mg/dL (ref 0.60–1.10)
GFR, Est AFR Am: >60 mL/min (ref >60)
GFR, Estimated: >60 mL/min (ref >60)
Glucose, Bld: 87 mg/dL (ref 70–140)
Potassium: 4.4 mmol/L (ref 3.5–5.1)
Sodium: 142 mmol/L (ref 136–145)
Total Bilirubin: <0.2 mg/dL — ABNORMAL LOW (ref 0.2–1.2)
Total Protein: 7.6 g/dL (ref 6.4–8.3)

## 2017-07-31 MED ORDER — GOSERELIN ACETATE 3.6 MG ~~LOC~~ IMPL
10.8000 mg | DRUG_IMPLANT | Freq: Once | SUBCUTANEOUS | Status: AC
  Administered 2017-07-31: 10.8 mg via SUBCUTANEOUS

## 2017-07-31 NOTE — Patient Instructions (Signed)
Goserelin injection What is this medicine? GOSERELIN (GOE se rel in) is similar to a hormone found in the body. It lowers the amount of sex hormones that the body makes. Men will have lower testosterone levels and women will have lower estrogen levels while taking this medicine. In men, this medicine is used to treat prostate cancer; the injection is either given once per month or once every 12 weeks. A once per month injection (only) is used to treat women with endometriosis, dysfunctional uterine bleeding, or advanced breast cancer. This medicine may be used for other purposes; ask your health care provider or pharmacist if you have questions. COMMON BRAND NAME(S): Zoladex What should I tell my health care provider before I take this medicine? They need to know if you have any of these conditions (some only apply to women): -diabetes -heart disease or previous heart attack -high blood pressure -high cholesterol -kidney disease -osteoporosis or low bone density -problems passing urine -spinal cord injury -stroke -tobacco smoker -an unusual or allergic reaction to goserelin, hormone therapy, other medicines, foods, dyes, or preservatives -pregnant or trying to get pregnant -breast-feeding How should I use this medicine? This medicine is for injection under the skin. It is given by a health care professional in a hospital or clinic setting. Men receive this injection once every 4 weeks or once every 12 weeks. Women will only receive the once every 4 weeks injection. Talk to your pediatrician regarding the use of this medicine in children. Special care may be needed. Overdosage: If you think you have taken too much of this medicine contact a poison control center or emergency room at once. NOTE: This medicine is only for you. Do not share this medicine with others. What if I miss a dose? It is important not to miss your dose. Call your doctor or health care professional if you are unable to  keep an appointment. What may interact with this medicine? -female hormones like estrogen -herbal or dietary supplements like black cohosh, chasteberry, or DHEA -female hormones like testosterone -prasterone This list may not describe all possible interactions. Give your health care provider a list of all the medicines, herbs, non-prescription drugs, or dietary supplements you use. Also tell them if you smoke, drink alcohol, or use illegal drugs. Some items may interact with your medicine. What should I watch for while using this medicine? Visit your doctor or health care professional for regular checks on your progress. Your symptoms may appear to get worse during the first weeks of this therapy. Tell your doctor or healthcare professional if your symptoms do not start to get better or if they get worse after this time. Your bones may get weaker if you take this medicine for a long time. If you smoke or frequently drink alcohol you may increase your risk of bone loss. A family history of osteoporosis, chronic use of drugs for seizures (convulsions), or corticosteroids can also increase your risk of bone loss. Talk to your doctor about how to keep your bones strong. This medicine should stop regular monthly menstration in women. Tell your doctor if you continue to menstrate. Women should not become pregnant while taking this medicine or for 12 weeks after stopping this medicine. Women should inform their doctor if they wish to become pregnant or think they might be pregnant. There is a potential for serious side effects to an unborn child. Talk to your health care professional or pharmacist for more information. Do not breast-feed an infant while taking   this medicine. Men should inform their doctors if they wish to father a child. This medicine may lower sperm counts. Talk to your health care professional or pharmacist for more information. What side effects may I notice from receiving this  medicine? Side effects that you should report to your doctor or health care professional as soon as possible: -allergic reactions like skin rash, itching or hives, swelling of the face, lips, or tongue -bone pain -breathing problems -changes in vision -chest pain -feeling faint or lightheaded, falls -fever, chills -pain, swelling, warmth in the leg -pain, tingling, numbness in the hands or feet -signs and symptoms of low blood pressure like dizziness; feeling faint or lightheaded, falls; unusually weak or tired -stomach pain -swelling of the ankles, feet, hands -trouble passing urine or change in the amount of urine -unusually high or low blood pressure -unusually weak or tired Side effects that usually do not require medical attention (report to your doctor or health care professional if they continue or are bothersome): -change in sex drive or performance -changes in breast size in both males and females -changes in emotions or moods -headache -hot flashes -irritation at site where injected -loss of appetite -skin problems like acne, dry skin -vaginal dryness This list may not describe all possible side effects. Call your doctor for medical advice about side effects. You may report side effects to FDA at 1-800-FDA-1088. Where should I keep my medicine? This drug is given in a hospital or clinic and will not be stored at home. NOTE: This sheet is a summary. It may not cover all possible information. If you have questions about this medicine, talk to your doctor, pharmacist, or health care provider.  2018 [employer] Standard (2013-04-19 11:10:35)  

## 2017-08-03 LAB — ESTRADIOL, ULTRA SENS: Estradiol, Sensitive: 19.7 pg/mL

## 2017-08-13 IMAGING — MG MM CLIP PLACEMENT
2 series · 2 of 2 positions shown · non-contrast
Comparison: Previous exam(s).

CLINICAL DATA: Status post MRI guided biopsy of an area of non mass
enhancement the left breast.

EXAM:
DIAGNOSTIC LEFT MAMMOGRAM POST MRI BIOPSY

[L CC]
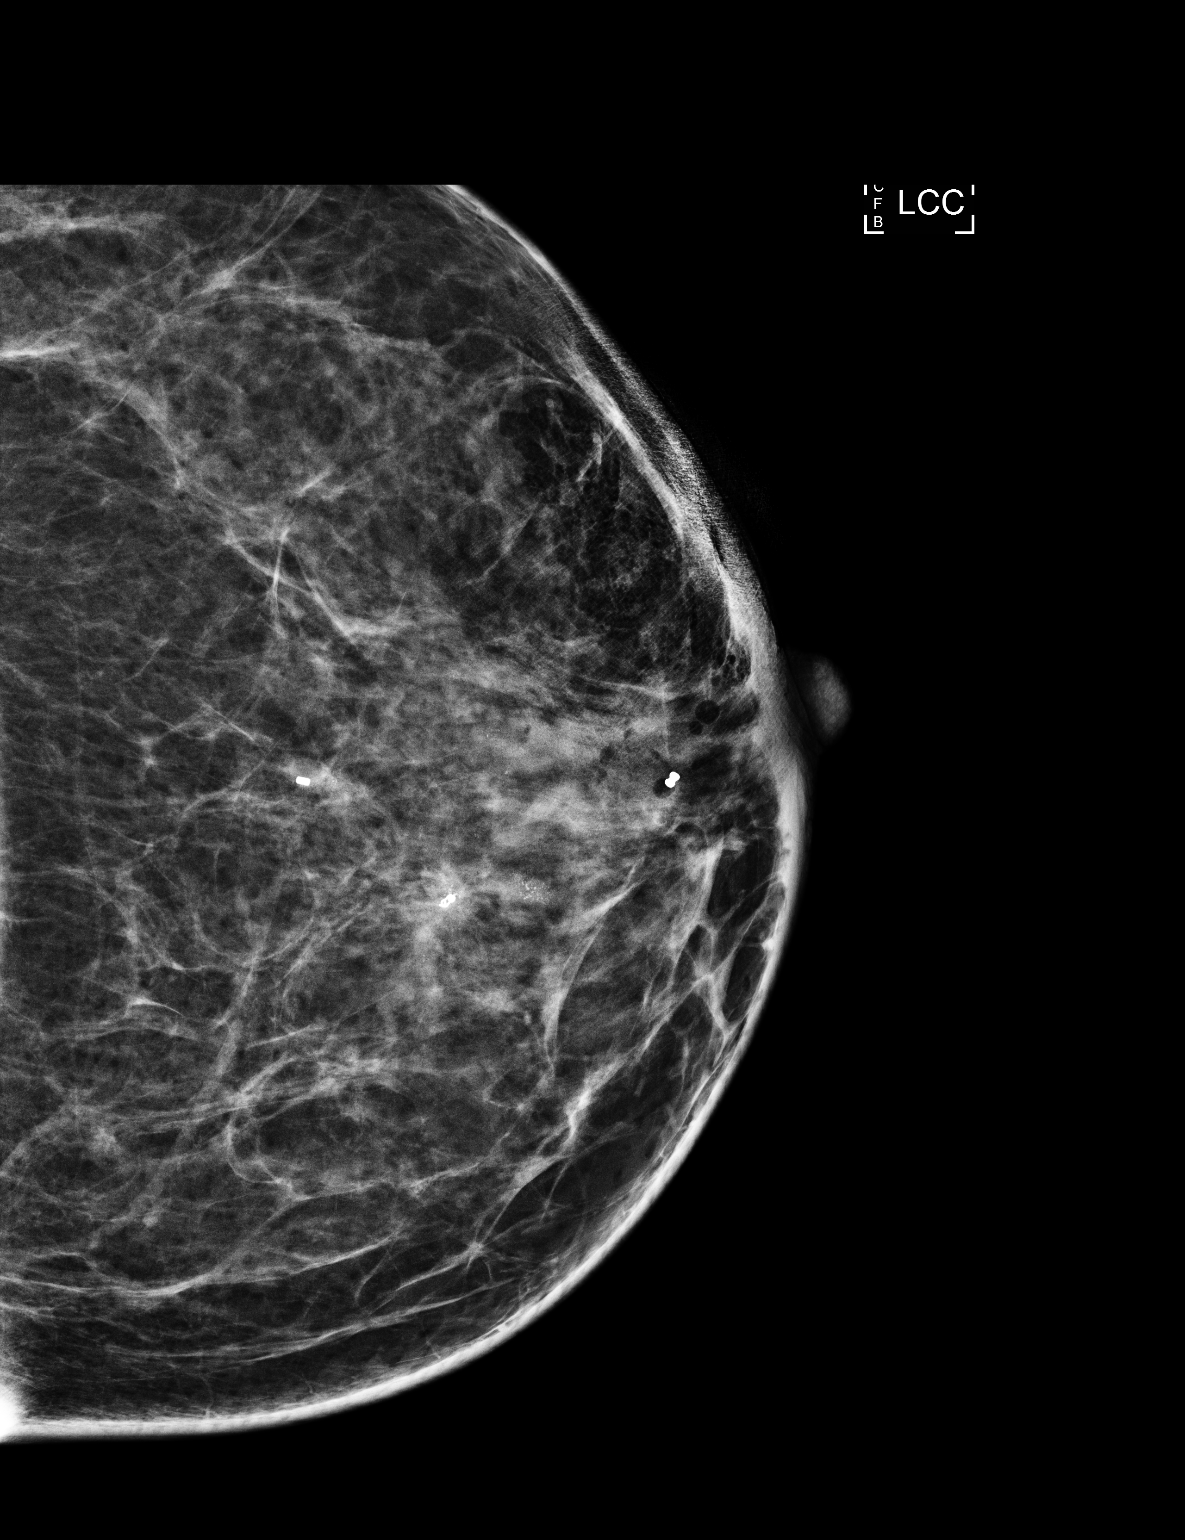

[L ML]
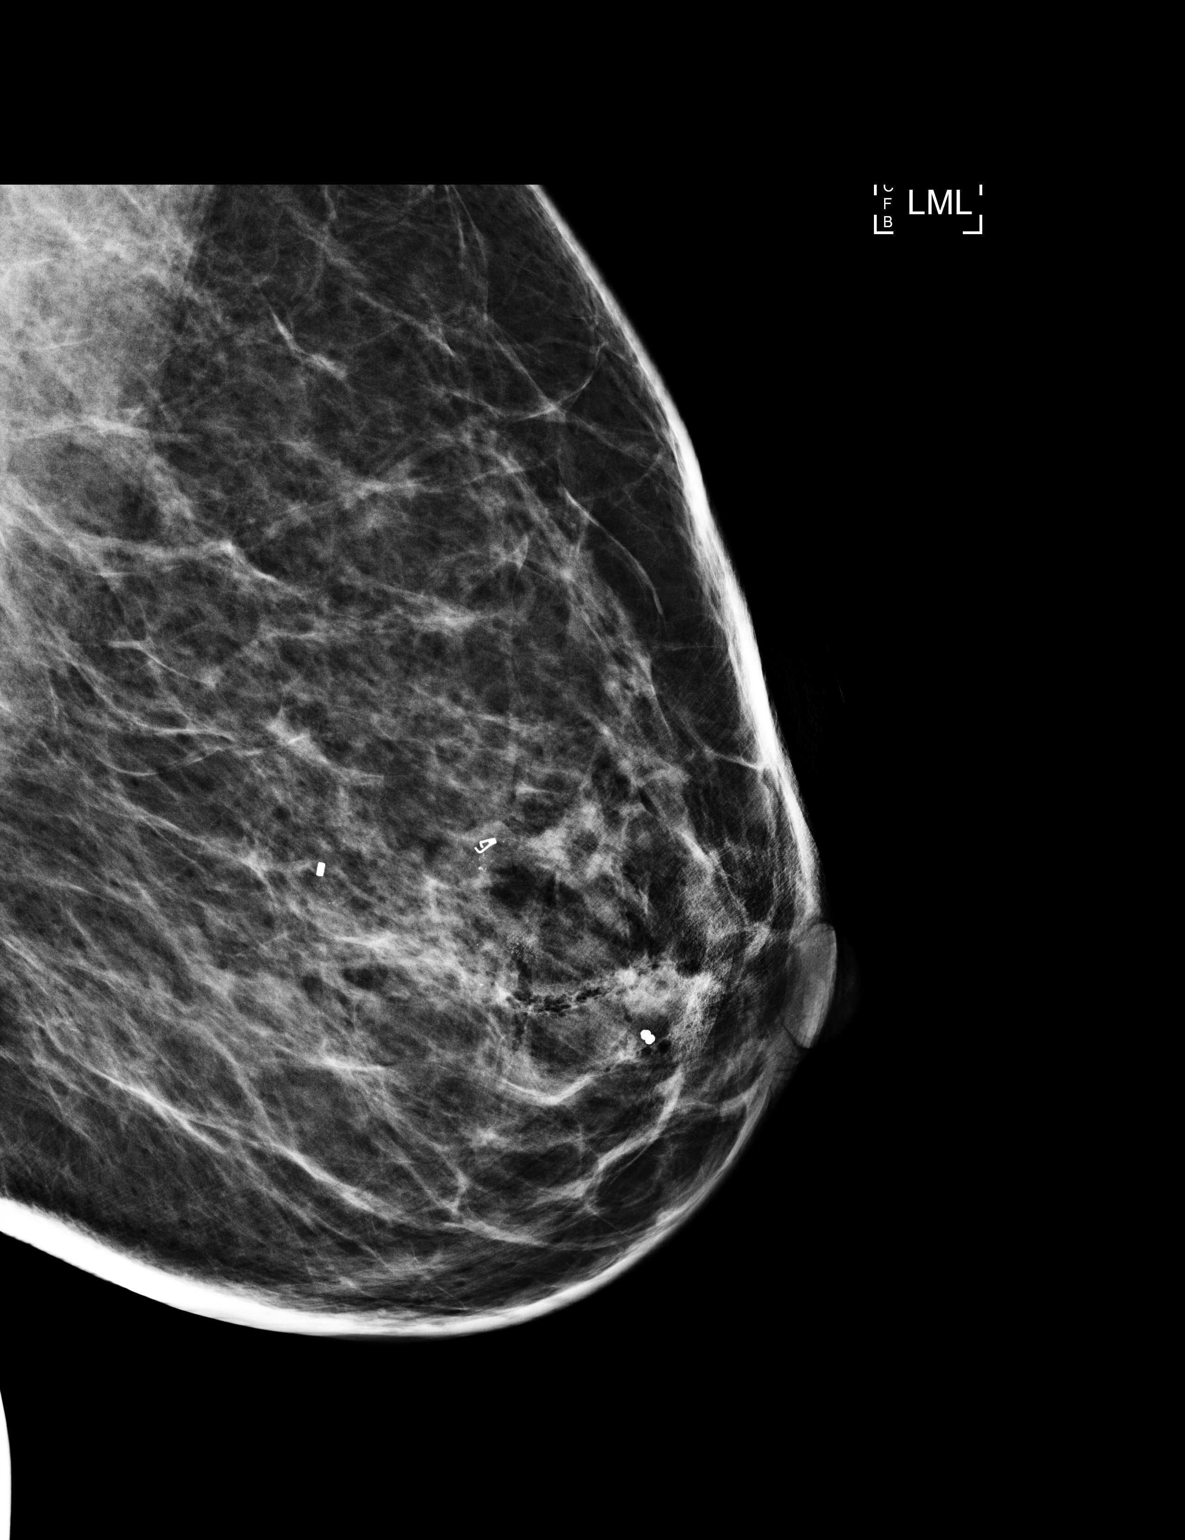

[2 of 2 positions shown; findings below may reference images not displayed]

FINDINGS: Mammographic images were obtained following MRI guided biopsy of an
area of non mass enhancement in the anterior left breast. The
dumbbell shaped biopsy clip lies in the expected location of the
area of non mass enhancement.
IMPRESSION: Well-positioned dumbbell shaped biopsy clip following MRI guided
biopsy of the left breast.

Final Assessment: Post Procedure Mammograms for Marker Placement

## 2017-09-28 ENCOUNTER — Other Ambulatory Visit: Payer: Self-pay | Admitting: Oncology

## 2017-10-02 ENCOUNTER — Ambulatory Visit: Payer: PRIVATE HEALTH INSURANCE

## 2017-10-02 ENCOUNTER — Ambulatory Visit: Payer: PRIVATE HEALTH INSURANCE | Admitting: Oncology

## 2017-10-02 ENCOUNTER — Other Ambulatory Visit: Payer: PRIVATE HEALTH INSURANCE

## 2017-10-27 NOTE — Progress Notes (Signed)
[place]  [phone] [phone]     ID: Virginia [name] DOB: March 25, 1980  MR#: [id number]  NUU#:[id number]  Patient Care Team: Virginia Shanks, MD as PCP - General (Family Medicine) Virginia Klein, MD as Consulting Physician (General Surgery) Virginia [name], Virginia Dad, MD as Consulting Physician (Oncology) Virginia Gibson, MD as Attending Physician (Radiation Oncology) Virginia Phlegm, NP as Nurse Practitioner (Hematology and Oncology) OTHER MD:  CHIEF COMPLAINT:  Estrogen receptor positive breast cancer  CURRENT TREATMENT: goserelin, tamoxifen  BREAST CANCER HISTORY: From the original intake note:  Virginia [name] noted some itching on her breasts and since this was the symptom that preceded her grandmother's breast cancer she underwent baseline bilateral screening mammography at [facility] 01/10/2016. The breast density was category B. In the left breast central to the nipple there was a 1.7 cm irregular high density mass with pleomorphic calcifications. Accordingly the patient was brought back 01/14/2016 for left diagnostic mammography and ultrasonography. This confirmed a 0.8 cm irregular mass in the left breast at the 10:00 position. 0.5 cm from the nipple lateral and posterior to that there was a 0.2 cm cluster of amorphous calcifications with an associated density. Anterior to the index mass was a 0.5 cm cluster of amorphous calcifications. All 3 findings taken together 3 cm. Ultrasound of the left breast found a 1 cm irregular mass in the left breast at the 10:00 position retroareolarly. There were no other sonographic abnormalities and the left axilla was sonographically benign  On 01/15/2016 the patient underwent core needle biopsy of the left breast index lesion at 10:00, and this found invasive ductal carcinoma, E-cadherin positive, estrogen receptor 90% positive, progesterone receptor 40% positive, both with moderate staining intensity, with an MIB-1 of  5%, but HER-2 amplified, with a signals ratio of 2.41 and the number per cell 6.50. One of the 2 areas of abnormal calcifications, the more posterior one measuring 0.2 cm, was also biopsied and showed only atypical ductal hyperplasia ([id number]).  Her subsequent history is as detailed below  INTERVAL HISTORY: Virginia [name] returns today for follow-up and treatment of her triple positive breast cancer. She continues on tamoxifen, with good tolerance. She has frequent hot flashes at night and during the day when she is working out. She denies issues with increased vaginal discharge.   She also receives goserelin every 12 weeks, which she tolerates well.    REVIEW OF SYSTEMS: Virginia [name] reports that she has been checking her BP for the last 6 weeks and generally in the 140s/90s. She followed up with her PCP and she was placed in amlodipine. She has been exercising, but she is disappointed that she is not losing weight. She eats a lot of bread and pasta. Her family is doing well and her children are in school. She denies unusual headaches, visual changes, nausea, vomiting, or dizziness. There has been no unusual cough, [name] production, or pleurisy. There has been no change in bowel or bladder habits. She denies unexplained fatigue or unexplained weight loss, bleeding, rash, or fever. A detailed review of systems was otherwise stable.    PAST MEDICAL HISTORY: Past Medical History:  Diagnosis Date  . Anemia    as a child  . Anxiety   . Cholestasis   . Elevated liver enzymes    during pregnancy March 2015  . Family history of breast cancer   . Family history of prostate cancer   . GERD (gastroesophageal reflux disease)    one episode  .  Headache   . History of radiation therapy 06/11/16- 07/23/16   Left Breast 50 Gy in 25 fractions, Left Breast boost 10 Gy in 5 fractions.   . Hypothyroidism   . Lymphedema of arm    LEFT  . Malignant neoplasm of upper-inner quadrant of left female breast  ([place]) 01/16/2016    PAST SURGICAL HISTORY: Past Surgical History:  Procedure Laterality Date  . BREAST LUMPECTOMY WITH RADIOACTIVE SEED AND SENTINEL LYMPH NODE BIOPSY Left 03/13/2016   Procedure: LEFT BREAST LUMPECTOMY WITH RADIOACTIVE SEED X 2 AND SENTINEL LYMPH NODE BIOPSY;  Surgeon: Virginia Klein, MD;  Location: [place];  Service: General;  Laterality: Left;  . NO PAST SURGERIES    . PORT-A-CATH REMOVAL N/A 04/16/2017   Procedure: REMOVAL PORT-A-CATH;  Surgeon: Virginia Klein, MD;  Location: [place];  Service: General;  Laterality: N/A;  . PORTACATH PLACEMENT Right 03/13/2016   Procedure: INSERTION PORT-A-CATH;  Surgeon: Virginia Klein, MD;  Location: [place];  Service: General;  Laterality: Right;    FAMILY HISTORY Family History  Problem Relation Age of Onset  . Hypertension Mother   . Hypertension Father   . Prostate cancer Father   . Liver cancer Maternal Grandmother   . Breast cancer Paternal Grandmother        dx in her 28s  . Lung cancer Paternal Aunt        smoker  . Heart attack Paternal Grandfather   . Liver cancer Other   The patient's parents are alive, in their mid 46s as of November 2017. The patient father was diagnosed with prostate cancer at age 53. A paternal aunt was diagnosed with lung cancer in her late 49s. Also the paternal grandmother was diagnosed with breast cancer at the age of 63. On the mother's side there are 2 cases of liver cancer, age of diagnosis unknown.  GYNECOLOGIC HISTORY:  No LMP recorded.  Menarche age 24, first live birth age 30, she is [place] P2. She still having periods, most recently 01/21/2016, but they are no longer regular. She remotely used a number ring and oral contraceptives but stopped in 2016.Currently there use barrier methods for contraception.   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 Her husband Virginia [name] works as a Printmaker for [employer] (for example he worked on our (). Their  children are Virginia [name] age 53 and in 8th grade and Virginia [name] age 92 and in [place] as of September 2019. Heaven is not a church attender     ADVANCED DIRECTIVES: Not in place   HEALTH MAINTENANCE: Social History   Tobacco Use  . Smoking status: Never Smoker  . Smokeless tobacco: Never Used  Substance Use Topics  . Alcohol use: No  . Drug use: No     Colonoscopy:  PAP:2016  Bone density:   Allergies  Allergen Reactions  . Amoxicillin Diarrhea and Nausea Only    Has patient had a PCN reaction causing immediate rash, facial/tongue/throat swelling, SOB or lightheadedness with hypotension: No Has patient had a PCN reaction causing severe rash involving mucus membranes or skin necrosis: No Has patient had a PCN reaction that required hospitalization No Has patient had a PCN reaction occurring within the last 10 years: Yes If all of the above answers are "NO", then may proceed with Cephalosporin use.     Current Outpatient Medications  Medication Sig Dispense Refill  . acetaminophen (TYLENOL) 500 MG tablet Take 500 mg by mouth  every 6 (six) hours as needed for headache.     . b complex vitamins tablet Take 1 tablet by mouth daily.    . butalbital-acetaminophen-caffeine (FIORICET, ESGIC) 50-325-40 MG tablet Take 1 tablet by mouth every 6 (six) hours as needed for headache.    Marland Kitchen goserelin (ZOLADEX) 10.8 MG injection Inject 10.8 mg into the skin every 3 (three) months. She is not sure of the exact dose     . ibuprofen (ADVIL,MOTRIN) 200 MG tablet Take 400 mg by mouth every 8 (eight) hours as needed for mild pain.     . Multiple Vitamins-Minerals (MULTIVITAMIN GUMMIES ADULT PO) Take 2 tablets by mouth daily.    . tamoxifen (NOLVADEX) 20 MG tablet TAKE 1 TABLET DAILY BY MOUTH. 30 tablet 3   No current facility-administered medications for this visit.    Facility-Administered Medications Ordered in Other Visits  Medication Dose Route Frequency Provider Last Rate Last Dose  .  goserelin (ZOLADEX) injection 10.8 mg  10.8 mg Subcutaneous Once Kermit Arnette, Virginia Dad, MD        OBJECTIVE: Young African-American woman in no acute distress  Vitals:   10/28/17 1516  BP: 133/84  Pulse: 73  Resp: 18  Temp: 98.3 F (36.8 C)  SpO2: 100%     Body mass index is 32.12 kg/m.    ECOG FS:1 - Symptomatic but completely ambulatory   Sclerae unicteric, EOMs intact No cervical or supraclavicular adenopathy Lungs no rales or rhonchi Heart regular rate and rhythm Abd soft, nontender, positive bowel sounds MSK no focal spinal tenderness, no upper extremity lymphedema Neuro: nonfocal, well oriented, appropriate affect Breasts: Right breast is benign.  The left breast has undergone lumpectomy followed by radiation.  There is the expected skin coarsening.  There is still mild hyperpigmentation.  There is no evidence of local recurrence.  Both axillae are benign.  LAB RESULTS:  CMP     Component Value Date/Time   NA 142 07/31/2017 1349   NA 142 02/27/2017 1350   K 4.4 07/31/2017 1349   K 3.7 02/27/2017 1350   CL 104 07/31/2017 1349   CO2 29 07/31/2017 1349   CO2 28 02/27/2017 1350   GLUCOSE 87 07/31/2017 1349   GLUCOSE 124 02/27/2017 1350   BUN 9 07/31/2017 1349   BUN 8.9 02/27/2017 1350   CREATININE 0.79 07/31/2017 1349   CREATININE 0.8 02/27/2017 1350   CALCIUM 9.9 07/31/2017 1349   CALCIUM 9.5 02/27/2017 1350   PROT 7.6 07/31/2017 1349   PROT 7.3 02/27/2017 1350   ALBUMIN 4.2 07/31/2017 1349   ALBUMIN 3.8 02/27/2017 1350   AST 24 07/31/2017 1349   AST 21 02/27/2017 1350   ALT 19 07/31/2017 1349   ALT 25 02/27/2017 1350   ALKPHOS 105 07/31/2017 1349   ALKPHOS 82 02/27/2017 1350   BILITOT <0.2 (L) 07/31/2017 1349   BILITOT 0.22 02/27/2017 1350   GFRNONAA >60 07/31/2017 1349   GFRAA >60 07/31/2017 1349    INo results found for: SPEP, UPEP  Lab Results  Component Value Date   WBC 4.0 10/28/2017   NEUTROABS 1.9 10/28/2017   HGB 12.7 10/28/2017   HCT 39.4  10/28/2017   MCV 83.7 10/28/2017   PLT 179 10/28/2017      Chemistry      Component Value Date/Time   NA 142 07/31/2017 1349   NA 142 02/27/2017 1350   K 4.4 07/31/2017 1349   K 3.7 02/27/2017 1350   CL 104 07/31/2017 1349   CO2  29 07/31/2017 1349   CO2 28 02/27/2017 1350   BUN 9 07/31/2017 1349   BUN 8.9 02/27/2017 1350   CREATININE 0.79 07/31/2017 1349   CREATININE 0.8 02/27/2017 1350      Component Value Date/Time   CALCIUM 9.9 07/31/2017 1349   CALCIUM 9.5 02/27/2017 1350   ALKPHOS 105 07/31/2017 1349   ALKPHOS 82 02/27/2017 1350   AST 24 07/31/2017 1349   AST 21 02/27/2017 1350   ALT 19 07/31/2017 1349   ALT 25 02/27/2017 1350   BILITOT <0.2 (L) 07/31/2017 1349   BILITOT 0.22 02/27/2017 1350       No results found for: LABCA2  No components found for: LABCA125  No results for input(s): INR in the last 168 hours.  Urinalysis    Component Value Date/Time   COLORURINE YELLOW 10/14/2012 1954   APPEARANCEUR CLEAR 10/14/2012 1954   LABSPEC >1.030 (H) 10/14/2012 1954   PHURINE 6.0 10/14/2012 1954   GLUCOSEU NEGATIVE 10/14/2012 1954   HGBUR TRACE (A) 10/14/2012 1954   BILIRUBINUR NEGATIVE 10/14/2012 1954   KETONESUR 15 (A) 10/14/2012 1954   PROTEINUR NEGATIVE 10/14/2012 1954   UROBILINOGEN 0.2 10/14/2012 1954   NITRITE NEGATIVE 10/14/2012 1954   LEUKOCYTESUR NEGATIVE 10/14/2012 1954     STUDIES: No results found.    ELIGIBLE FOR AVAILABLE RESEARCH PROTOCOL: no  ASSESSMENT: 37 y.o. Virginia [name] woman status post left breast upper inner quadrant biopsy 01/15/2016 for a clinical T1c N0, stage IA invasive ductal carcinoma, E-cadherin positive, estrogen receptor 90% positive, progesterone receptor 40% positive, both with moderate staining intensity, with an MIB-1 of 5%, but HER-2 amplified, with a signals ratio of 2.41 and the number per cell 6.50.  (a) One of 2 areas of abnormal calcifications, the more posterior one measuring 0.2 cm, was also biopsied and  showed only atypical ductal hyperplasia  (b) a 4.1 cm area of non-masslike enhancement in the left breast underwent biopsy 02/19/2016 showing only fibrocystic changes  (1) tamoxifen started 01/23/2016 neoadjuvantly  (2) genetics testing 02/22/2016 through the Breast/Ovarian gene panel offered by [facility] found no deleterious mutations in  ATM, BARD1, BRCA1, BRCA2, BRIP1, CDH1, CHEK2, EPCAM, FANCC, MLH1, MSH2, MSH6, NBN, PALB2, PMS2, PTEN, RAD51C, RAD51D, TP53, and XRCC2.     (3) status post left lumpectomy and sentinel lymph node sampling 03/13/2016 for an mpT1c pN0, stage IA invasive ductal carcinoma, grade 2, with negative margins.  (4) paclitaxel weekly 12 and trastuzumab every 21 days beginning on 04/10/2016.  (a) paclitaxel discontinued after 5 doses because of atypical neuropathy symptoms. Last dose 04/28/2016  (5) trastuzumab continued to complete a year--last dose 04/10/2017  (a) echocardiogram 02/11/2016 shows ejection fraction in the 60-65% range.  (b) echo 05/28/2916 shows EF in the 65-70% range  (c) echo on 09/26/2016 shows LVEF of 55-60%  (d) echo on 01/14/2017 shows EF in the 60-65%  (6) adjuvant radiation 06/11/16 - 07/23/16 Site/dose:    1) Left Breast / 50 Gy in 25 fractions 2) Left Breast Boost / 10 Gy in 5 fractions  (7) tamoxifen resumed to 07/25/2016  (8) patient resumed menses post chemo, April 2018  (a) goserelin started 06/17/2016, initially given every 28 days, however changed to every 12 weeks on 11/12/16 with estradiol monitoring.  PLAN:  Virginia [name] is now a year and three quarters out from definitive surgery for her breast cancer with no evidence of disease recurrence.  This is very favorable.  She is tolerating tamoxifen generally well except for the problem of hot flashes.  We discussed venlafaxine very low dose and also gabapentin to take at bedtime.  I wrote those 2 prescriptions for her after going over the possible toxicity side effects and complications of  these agents.  I think we will provide her with significant relief.  She is worried about her blood pressure.  This is not going to be related to her go [place] or tamoxifen but it is going to be related to weight gain.  It is going to be difficult for her to lose weight because of menopause.  If she really does want to lose weight she is going to have to stop eating pasta potatoes bread and rice.  She is not going to have any sodas.  If she does that she will lose between 20 and 40 pounds in the next a year, even though she can eat meat, dairy and particularly vegetables fairly freely.  She is going to give it a try.  She will have mammography in October.  I am going to see her again in February.  Assuming all continues well I will start seeing her on a once a year basis at that point  She knows to call for any other issues that may develop before the next visit.   Elaina Cara, Virginia Dad, MD  10/28/17 3:30 PM Medical Oncology and Hematology [facility] [address] [place], [place] [address] Tel. [phone]    Fax. [phone]  Alice Rieger, am acting as scribe for Chauncey Cruel MD.  I, Lurline Del MD, have reviewed the above documentation for accuracy and completeness, and I agree with the above.

## 2017-10-28 ENCOUNTER — Telehealth: Payer: Self-pay | Admitting: Oncology

## 2017-10-28 ENCOUNTER — Inpatient Hospital Stay: Payer: PRIVATE HEALTH INSURANCE

## 2017-10-28 ENCOUNTER — Inpatient Hospital Stay (HOSPITAL_BASED_OUTPATIENT_CLINIC_OR_DEPARTMENT_OTHER): Payer: PRIVATE HEALTH INSURANCE | Admitting: Oncology

## 2017-10-28 ENCOUNTER — Inpatient Hospital Stay: Payer: PRIVATE HEALTH INSURANCE | Attending: Adult Health

## 2017-10-28 VITALS — BP 133/84 | HR 73 | Temp 98.3°F | Resp 18 | Ht 67.0 in | Wt 205.1 lb

## 2017-10-28 DIAGNOSIS — N951 Menopausal and female climacteric states: Secondary | ICD-10-CM | POA: Insufficient documentation

## 2017-10-28 DIAGNOSIS — Z5111 Encounter for antineoplastic chemotherapy: Secondary | ICD-10-CM | POA: Insufficient documentation

## 2017-10-28 DIAGNOSIS — Z923 Personal history of irradiation: Secondary | ICD-10-CM | POA: Diagnosis not present

## 2017-10-28 DIAGNOSIS — Z170 Estrogen receptor positive status [ER+]: Secondary | ICD-10-CM

## 2017-10-28 DIAGNOSIS — C50212 Malignant neoplasm of upper-inner quadrant of left female breast: Secondary | ICD-10-CM

## 2017-10-28 DIAGNOSIS — Z79810 Long term (current) use of selective estrogen receptor modulators (SERMs): Secondary | ICD-10-CM

## 2017-10-28 DIAGNOSIS — Z95828 Presence of other vascular implants and grafts: Secondary | ICD-10-CM

## 2017-10-28 LAB — CBC WITH DIFFERENTIAL/PLATELET
Basophils Absolute: 0 10*3/uL (ref 0.0–0.1)
Basophils Relative: 1 %
Eosinophils Absolute: 0.1 10*3/uL (ref 0.0–0.5)
Eosinophils Relative: 2 %
HCT: 39.4 % (ref 34.8–46.6)
Hemoglobin: 12.7 g/dL (ref 11.6–15.9)
Lymphocytes Relative: 40 %
Lymphs Abs: 1.6 10*3/uL (ref 0.9–3.3)
MCH: 27.1 pg (ref 25.1–34.0)
MCHC: 32.4 g/dL (ref 31.5–36.0)
MCV: 83.7 fL (ref 79.5–101.0)
Monocytes Absolute: 0.4 10*3/uL (ref 0.1–0.9)
Monocytes Relative: 10 %
Neutro Abs: 1.9 10*3/uL (ref 1.5–6.5)
Neutrophils Relative %: 47 %
Platelets: 179 10*3/uL (ref 145–400)
RBC: 4.7 MIL/uL (ref 3.70–5.45)
RDW: 13.4 % (ref 11.2–14.5)
WBC: 4 10*3/uL (ref 3.9–10.3)

## 2017-10-28 LAB — COMPREHENSIVE METABOLIC PANEL
ALT: 20 U/L (ref 0–44)
AST: 20 U/L (ref 15–41)
Albumin: 4.1 g/dL (ref 3.5–5.0)
Alkaline Phosphatase: 86 U/L (ref 38–126)
Anion gap: 10 (ref 5–15)
BUN: 13 mg/dL (ref 6–20)
CO2: 27 mmol/L (ref 22–32)
Calcium: 10 mg/dL (ref 8.9–10.3)
Chloride: 105 mmol/L (ref 98–111)
Creatinine, Ser: 0.82 mg/dL (ref 0.44–1.00)
GFR calc Af Amer: >60 mL/min (ref >60)
GFR calc non Af Amer: >60 mL/min (ref >60)
Glucose, Bld: 87 mg/dL (ref 70–99)
Potassium: 4 mmol/L (ref 3.5–5.1)
Sodium: 142 mmol/L (ref 135–145)
Total Bilirubin: 0.2 mg/dL — ABNORMAL LOW (ref 0.3–1.2)
Total Protein: 7.7 g/dL (ref 6.5–8.1)

## 2017-10-28 MED ORDER — GABAPENTIN 300 MG PO CAPS: 300.0000 mg | ORAL_CAPSULE | Freq: Every day | ORAL | 4 refills | Status: DC

## 2017-10-28 MED ORDER — GOSERELIN ACETATE 3.6 MG ~~LOC~~ IMPL
10.8000 mg | DRUG_IMPLANT | Freq: Once | SUBCUTANEOUS | Status: AC
  Administered 2017-10-28: 10.8 mg via SUBCUTANEOUS

## 2017-10-28 MED ORDER — VENLAFAXINE HCL ER 37.5 MG PO CP24: 37.5000 mg | ORAL_CAPSULE | Freq: Every day | ORAL | 3 refills | Status: DC

## 2017-10-28 MED ORDER — TAMOXIFEN CITRATE 20 MG PO TABS: 20.0000 mg | ORAL_TABLET | Freq: Every day | ORAL | 3 refills | Status: DC

## 2017-10-28 NOTE — Telephone Encounter (Signed)
Gave pt avs and calendar  °

## 2017-10-30 LAB — ESTRADIOL, ULTRA SENS: Estradiol, Sensitive: 22.4 pg/mL

## 2018-01-25 ENCOUNTER — Inpatient Hospital Stay: Payer: PRIVATE HEALTH INSURANCE | Attending: Adult Health

## 2018-01-25 ENCOUNTER — Inpatient Hospital Stay: Payer: PRIVATE HEALTH INSURANCE

## 2018-01-25 DIAGNOSIS — Z170 Estrogen receptor positive status [ER+]: Secondary | ICD-10-CM

## 2018-01-25 DIAGNOSIS — C50212 Malignant neoplasm of upper-inner quadrant of left female breast: Secondary | ICD-10-CM | POA: Diagnosis not present

## 2018-01-25 DIAGNOSIS — Z95828 Presence of other vascular implants and grafts: Secondary | ICD-10-CM

## 2018-01-25 DIAGNOSIS — Z5111 Encounter for antineoplastic chemotherapy: Secondary | ICD-10-CM | POA: Diagnosis present

## 2018-01-25 DIAGNOSIS — Z79899 Other long term (current) drug therapy: Secondary | ICD-10-CM | POA: Insufficient documentation

## 2018-01-25 DIAGNOSIS — Z923 Personal history of irradiation: Secondary | ICD-10-CM | POA: Diagnosis not present

## 2018-01-25 DIAGNOSIS — Z79810 Long term (current) use of selective estrogen receptor modulators (SERMs): Secondary | ICD-10-CM | POA: Diagnosis not present

## 2018-01-25 DIAGNOSIS — N951 Menopausal and female climacteric states: Secondary | ICD-10-CM | POA: Insufficient documentation

## 2018-01-25 LAB — CBC WITH DIFFERENTIAL/PLATELET
Abs Immature Granulocytes: 0 10*3/uL (ref 0.00–0.07)
Basophils Absolute: 0 10*3/uL (ref 0.0–0.1)
Basophils Relative: 1 %
Eosinophils Absolute: 0.1 10*3/uL (ref 0.0–0.5)
Eosinophils Relative: 2 %
HCT: 40.8 % (ref 36.0–46.0)
Hemoglobin: 12.7 g/dL (ref 12.0–15.0)
Immature Granulocytes: 0 %
Lymphocytes Relative: 51 %
Lymphs Abs: 2.1 10*3/uL (ref 0.7–4.0)
MCH: 27.1 pg (ref 26.0–34.0)
MCHC: 31.1 g/dL (ref 30.0–36.0)
MCV: 87.2 fL (ref 80.0–100.0)
Monocytes Absolute: 0.5 10*3/uL (ref 0.1–1.0)
Monocytes Relative: 11 %
Neutro Abs: 1.4 10*3/uL — ABNORMAL LOW (ref 1.7–7.7)
Neutrophils Relative %: 35 %
Platelets: 168 10*3/uL (ref 150–400)
RBC: 4.68 MIL/uL (ref 3.87–5.11)
RDW: 13.2 % (ref 11.5–15.5)
WBC: 4.1 10*3/uL (ref 4.0–10.5)
nRBC: 0 % (ref 0.0–0.2)

## 2018-01-25 LAB — COMPREHENSIVE METABOLIC PANEL
ALT: 73 U/L — ABNORMAL HIGH (ref 0–44)
AST: 37 U/L (ref 15–41)
Albumin: 4.2 g/dL (ref 3.5–5.0)
Alkaline Phosphatase: 97 U/L (ref 38–126)
Anion gap: 12 (ref 5–15)
BUN: 9 mg/dL (ref 6–20)
CO2: 27 mmol/L (ref 22–32)
Calcium: 9.6 mg/dL (ref 8.9–10.3)
Chloride: 104 mmol/L (ref 98–111)
Creatinine, Ser: 0.91 mg/dL (ref 0.44–1.00)
GFR calc Af Amer: >60 mL/min (ref >60)
GFR calc non Af Amer: >60 mL/min (ref >60)
Glucose, Bld: 86 mg/dL (ref 70–99)
Potassium: 4 mmol/L (ref 3.5–5.1)
Sodium: 143 mmol/L (ref 135–145)
Total Bilirubin: 0.3 mg/dL (ref 0.3–1.2)
Total Protein: 7.6 g/dL (ref 6.5–8.1)

## 2018-01-25 MED ORDER — GOSERELIN ACETATE 3.6 MG ~~LOC~~ IMPL
10.8000 mg | DRUG_IMPLANT | Freq: Once | SUBCUTANEOUS | Status: AC
  Administered 2018-01-25: 10.8 mg via SUBCUTANEOUS

## 2018-01-25 NOTE — Patient Instructions (Signed)
Goserelin injection What is this medicine? GOSERELIN (GOE se rel in) is similar to a hormone found in the body. It lowers the amount of sex hormones that the body makes. Men will have lower testosterone levels and women will have lower estrogen levels while taking this medicine. In men, this medicine is used to treat prostate cancer; the injection is either given once per month or once every 12 weeks. A once per month injection (only) is used to treat women with endometriosis, dysfunctional uterine bleeding, or advanced breast cancer. This medicine may be used for other purposes; ask your health care provider or pharmacist if you have questions. COMMON BRAND NAME(S): Zoladex What should I tell my health care provider before I take this medicine? They need to know if you have any of these conditions (some only apply to women): -diabetes -heart disease or previous heart attack -high blood pressure -high cholesterol -kidney disease -osteoporosis or low bone density -problems passing urine -spinal cord injury -stroke -tobacco smoker -an unusual or allergic reaction to goserelin, hormone therapy, other medicines, foods, dyes, or preservatives -pregnant or trying to get pregnant -breast-feeding How should I use this medicine? This medicine is for injection under the skin. It is given by a health care professional in a hospital or clinic setting. Men receive this injection once every 4 weeks or once every 12 weeks. Women will only receive the once every 4 weeks injection. Talk to your pediatrician regarding the use of this medicine in children. Special care may be needed. Overdosage: If you think you have taken too much of this medicine contact a poison control center or emergency room at once. NOTE: This medicine is only for you. Do not share this medicine with others. What if I miss a dose? It is important not to miss your dose. Call your doctor or health care professional if you are unable to  keep an appointment. What may interact with this medicine? -female hormones like estrogen -herbal or dietary supplements like black cohosh, chasteberry, or DHEA -female hormones like testosterone -prasterone This list may not describe all possible interactions. Give your health care provider a list of all the medicines, herbs, non-prescription drugs, or dietary supplements you use. Also tell them if you smoke, drink alcohol, or use illegal drugs. Some items may interact with your medicine. What should I watch for while using this medicine? Visit your doctor or health care professional for regular checks on your progress. Your symptoms may appear to get worse during the first weeks of this therapy. Tell your doctor or healthcare professional if your symptoms do not start to get better or if they get worse after this time. Your bones may get weaker if you take this medicine for a long time. If you smoke or frequently drink alcohol you may increase your risk of bone loss. A family history of osteoporosis, chronic use of drugs for seizures (convulsions), or corticosteroids can also increase your risk of bone loss. Talk to your doctor about how to keep your bones strong. This medicine should stop regular monthly menstration in women. Tell your doctor if you continue to menstrate. Women should not become pregnant while taking this medicine or for 12 weeks after stopping this medicine. Women should inform their doctor if they wish to become pregnant or think they might be pregnant. There is a potential for serious side effects to an unborn child. Talk to your health care professional or pharmacist for more information. Do not breast-feed an infant while taking   this medicine. Men should inform their doctors if they wish to father a child. This medicine may lower sperm counts. Talk to your health care professional or pharmacist for more information. What side effects may I notice from receiving this  medicine? Side effects that you should report to your doctor or health care professional as soon as possible: -allergic reactions like skin rash, itching or hives, swelling of the face, lips, or tongue -bone pain -breathing problems -changes in vision -chest pain -feeling faint or lightheaded, falls -fever, chills -pain, swelling, warmth in the leg -pain, tingling, numbness in the hands or feet -signs and symptoms of low blood pressure like dizziness; feeling faint or lightheaded, falls; unusually weak or tired -stomach pain -swelling of the ankles, feet, hands -trouble passing urine or change in the amount of urine -unusually high or low blood pressure -unusually weak or tired Side effects that usually do not require medical attention (report to your doctor or health care professional if they continue or are bothersome): -change in sex drive or performance -changes in breast size in both males and females -changes in emotions or moods -headache -hot flashes -irritation at site where injected -loss of appetite -skin problems like acne, dry skin -vaginal dryness This list may not describe all possible side effects. Call your doctor for medical advice about side effects. You may report side effects to FDA at 1-800-FDA-1088. Where should I keep my medicine? This drug is given in a hospital or clinic and will not be stored at home. NOTE: This sheet is a summary. It may not cover all possible information. If you have questions about this medicine, talk to your doctor, pharmacist, or health care provider.  2018 [employer] Standard (2013-04-19 11:10:35)  

## 2018-01-29 LAB — ESTRADIOL, ULTRA SENS: Estradiol, Sensitive: 11.8 pg/mL

## 2018-01-30 ENCOUNTER — Other Ambulatory Visit: Payer: Self-pay | Admitting: Nurse Practitioner

## 2018-03-18 ENCOUNTER — Other Ambulatory Visit: Payer: Self-pay | Admitting: Nurse Practitioner

## 2018-03-20 ENCOUNTER — Other Ambulatory Visit: Payer: Self-pay | Admitting: Nurse Practitioner

## 2018-03-26 ENCOUNTER — Other Ambulatory Visit: Payer: Self-pay | Admitting: Nurse Practitioner

## 2018-04-25 NOTE — Progress Notes (Signed)
Virginia [name]  [phone] [phone]    ID: CLOTIEL TROOP DOB: 07-09-1980  MR#: [id number]  ESP#:[id number]  Patient Care Team: [name], MD as PCP - General (Family Medicine) Stark Klein, MD as Consulting Physician (General Surgery) Dryden Tapley, Virgie Dad, MD as Consulting Physician (Oncology) Eppie Gibson, MD as Attending Physician (Radiation Oncology) Gardenia Phlegm, NP as Nurse Practitioner (Hematology and Oncology) OTHER MD:   CHIEF COMPLAINT:  Estrogen receptor positive breast cancer  CURRENT TREATMENT: goserelin, tamoxifen   BREAST CANCER HISTORY: From the original intake note:  Virginia [name] noted some itching on her breasts and since this was the symptom that preceded her grandmother's breast cancer she underwent baseline bilateral screening mammography at [facility] 01/10/2016. The breast density was category B. In the left breast central to the nipple there was a 1.7 cm irregular high density mass with pleomorphic calcifications. Accordingly the patient was brought back 01/14/2016 for left diagnostic mammography and ultrasonography. This confirmed a 0.8 cm irregular mass in the left breast at the 10:00 position. 0.5 cm from the nipple lateral and posterior to that there was a 0.2 cm cluster of amorphous calcifications with an associated density. Anterior to the index mass was a 0.5 cm cluster of amorphous calcifications. All 3 findings taken together 3 cm. Ultrasound of the left breast found a 1 cm irregular mass in the left breast at the 10:00 position retroareolarly. There were no other sonographic abnormalities and the left axilla was sonographically benign  On 01/15/2016 the patient underwent core needle biopsy of the left breast index lesion at 10:00, and this found invasive ductal carcinoma, E-cadherin positive, estrogen receptor 90% positive, progesterone receptor 40% positive, both with moderate staining intensity, with an MIB-1 of  5%, but HER-2 amplified, with a signals ratio of 2.41 and the number per cell 6.50. One of the 2 areas of abnormal calcifications, the more posterior one measuring 0.2 cm, was also biopsied and showed only atypical ductal hyperplasia ([id number]).  Her subsequent history is as detailed below   INTERVAL HISTORY: Mala returns today for follow-up and treatment of her triple positive breast cancer.   She continues on tamoxifen. Her vaginal wetness has gotten better than in the past; it's much more bearable now.   She also continues on goserelin. Her most recent dose was received on 01/25/2018.    Since her last visit here, she has not undergone any additional studies.     REVIEW OF SYSTEMS: Kinze has cut her portion sizes and has been exercising regularly. She got a spin-bike, which she rides 3 times a week for about 30 to 45 minutes. She tries to keep busy in order to keep her mind off things. Her headaches have gotten better in the last few months. She notes some tenderness in her left breast; she wears compression bras to treat. The patient denies visual changes, nausea, vomiting, or dizziness. There has been no unusual cough, phlegm production, or pleurisy. This been no change in bowel or bladder habits. The patient denies unexplained fatigue or unexplained weight loss, bleeding, rash, or fever. A detailed review of systems was otherwise noncontributory.    PAST MEDICAL HISTORY: Past Medical History:  Diagnosis Date  . Anemia    as a child  . Anxiety   . Cholestasis   . Elevated liver enzymes    during pregnancy March 2015  . Family history of breast cancer   . Family history of prostate cancer   .  GERD (gastroesophageal reflux disease)    one episode  . Headache   . History of radiation therapy 06/11/16- 07/23/16   Left Breast 50 Gy in 25 fractions, Left Breast boost 10 Gy in 5 fractions.   . Hypothyroidism   . Lymphedema of arm    LEFT  . Malignant neoplasm of  upper-inner quadrant of left female breast ([place]) 01/16/2016    PAST SURGICAL HISTORY: Past Surgical History:  Procedure Laterality Date  . BREAST LUMPECTOMY WITH RADIOACTIVE SEED AND SENTINEL LYMPH NODE BIOPSY Left 03/13/2016   Procedure: LEFT BREAST LUMPECTOMY WITH RADIOACTIVE SEED X 2 AND SENTINEL LYMPH NODE BIOPSY;  Surgeon: Stark Klein, MD;  Location: [place];  Service: General;  Laterality: Left;  . NO PAST SURGERIES    . PORT-A-CATH REMOVAL N/A 04/16/2017   Procedure: REMOVAL PORT-A-CATH;  Surgeon: Stark Klein, MD;  Location: [place];  Service: General;  Laterality: N/A;  . PORTACATH PLACEMENT Right 03/13/2016   Procedure: INSERTION PORT-A-CATH;  Surgeon: Stark Klein, MD;  Location: [place];  Service: General;  Laterality: Right;    FAMILY HISTORY: Family History  Problem Relation Age of Onset  . Hypertension Mother   . Hypertension Father   . Prostate cancer Father   . Liver cancer Maternal Grandmother   . Breast cancer Paternal Grandmother        dx in her 62s  . Lung cancer Paternal Aunt        smoker  . Heart attack Paternal Grandfather   . Liver cancer Other    The patient's parents are alive, in their mid 17s as of November 2017. The patient father was diagnosed with prostate cancer at age 23. A paternal aunt was diagnosed with lung cancer in her late 87s. Also the paternal grandmother was diagnosed with breast cancer at the age of 41. On the mother's side there are 2 cases of liver cancer, age of diagnosis unknown.   GYNECOLOGIC HISTORY:  No LMP recorded.  Menarche age 23, first live birth age 26, she is [place] P2. She still having periods, most recently 01/21/2016, but they are no longer regular. She remotely used a number ring and oral contraceptives but stopped in 2016.Currently there use barrier methods for contraception.   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 Her husband Virginia [name] works as a Printmaker for [employer] (for example he worked on our (). Their children are Virginia [name] age 22 and in 8th grade and Virginia [name] age 68 and in [place] as of September 2019. Virginia [name] is not a church attender   ADVANCED DIRECTIVES: Not in place   HEALTH MAINTENANCE: Social History   Tobacco Use  . Smoking status: Never Smoker  . Smokeless tobacco: Never Used  Substance Use Topics  . Alcohol use: No  . Drug use: No     Colonoscopy:  PAP:2016  Bone density:   Allergies  Allergen Reactions  . Amoxicillin Diarrhea and Nausea Only    Has patient had a PCN reaction causing immediate rash, facial/tongue/throat swelling, SOB or lightheadedness with hypotension: No Has patient had a PCN reaction causing severe rash involving mucus membranes or skin necrosis: No Has patient had a PCN reaction that required hospitalization No Has patient had a PCN reaction occurring within the last 10 years: Yes If all of the above answers are "NO", then may proceed with Cephalosporin use.     Current Outpatient Medications  Medication Sig Dispense Refill  .  acetaminophen (TYLENOL) 500 MG tablet Take 500 mg by mouth every 6 (six) hours as needed for headache.     . b complex vitamins tablet Take 1 tablet by mouth daily.    Marland Kitchen gabapentin (NEURONTIN) 300 MG capsule Take 1 capsule (300 mg total) by mouth at bedtime. 90 capsule 4  . goserelin (ZOLADEX) 10.8 MG injection Inject 10.8 mg into the skin every 3 (three) months. She is not sure of the exact dose     . ibuprofen (ADVIL,MOTRIN) 200 MG tablet Take 400 mg by mouth every 8 (eight) hours as needed for mild pain.     . Multiple Vitamins-Minerals (MULTIVITAMIN GUMMIES ADULT PO) Take 2 tablets by mouth daily.    . tamoxifen (NOLVADEX) 20 MG tablet Take 1 tablet (20 mg total) by mouth daily. 90 tablet 3  . venlafaxine XR (EFFEXOR-XR) 37.5 MG 24 hr capsule Take 1 capsule (37.5 mg total) by mouth daily with breakfast. 90 capsule 3   No current facility-administered  medications for this visit.    Facility-Administered Medications Ordered in Other Visits  Medication Dose Route Frequency Provider Last Rate Last Dose  . goserelin (ZOLADEX) injection 10.8 mg  10.8 mg Subcutaneous Once Marcellus Pulliam, Virgie Dad, MD        OBJECTIVE: Young African-American woman who appears well  Vitals:   04/26/18 1409  BP: 121/86  Pulse: 74  Resp: 18  Temp: 98.3 F (36.8 C)  SpO2: 99%     Body mass index is 28.29 kg/m.    ECOG FS:1 - Symptomatic but completely ambulatory   Sclerae unicteric, EOMs intact Oropharynx clear and moist No cervical or supraclavicular adenopathy Lungs no rales or rhonchi Heart regular rate and rhythm Abd soft, nontender, positive bowel sounds MSK no focal spinal tenderness, no upper extremity lymphedema Neuro: nonfocal, well oriented, appropriate affect Breasts: The right breast is unremarkable.  The left breast is status post lumpectomy and radiation.  It is still slightly hyperpigmented.  There is some skin coarsening.  There are no findings of concern.  Both axillae are benign.Marland Kitchen   LAB RESULTS:  CMP     Component Value Date/Time   NA 142 04/26/2018 1356   NA 142 02/27/2017 1350   K 4.2 04/26/2018 1356   K 3.7 02/27/2017 1350   CL 105 04/26/2018 1356   CO2 28 04/26/2018 1356   CO2 28 02/27/2017 1350   GLUCOSE 87 04/26/2018 1356   GLUCOSE 124 02/27/2017 1350   BUN 10 04/26/2018 1356   BUN 8.9 02/27/2017 1350   CREATININE 0.82 04/26/2018 1356   CREATININE 0.79 07/31/2017 1349   CREATININE 0.8 02/27/2017 1350   CALCIUM 9.1 04/26/2018 1356   CALCIUM 9.5 02/27/2017 1350   PROT 7.3 04/26/2018 1356   PROT 7.3 02/27/2017 1350   ALBUMIN 3.9 04/26/2018 1356   ALBUMIN 3.8 02/27/2017 1350   AST 27 04/26/2018 1356   AST 24 07/31/2017 1349   AST 21 02/27/2017 1350   ALT 24 04/26/2018 1356   ALT 19 07/31/2017 1349   ALT 25 02/27/2017 1350   ALKPHOS 100 04/26/2018 1356   ALKPHOS 82 02/27/2017 1350   BILITOT <0.2 (L) 04/26/2018 1356    BILITOT <0.2 (L) 07/31/2017 1349   BILITOT 0.22 02/27/2017 1350   GFRNONAA >60 04/26/2018 1356   GFRNONAA >60 07/31/2017 1349   GFRAA >60 04/26/2018 1356   GFRAA >60 07/31/2017 1349    INo results found for: SPEP, UPEP  Lab Results  Component Value Date   WBC  3.9 (L) 04/26/2018   NEUTROABS 1.3 (L) 04/26/2018   HGB 12.2 04/26/2018   HCT 39.1 04/26/2018   MCV 87.1 04/26/2018   PLT 153 04/26/2018      Chemistry      Component Value Date/Time   NA 142 04/26/2018 1356   NA 142 02/27/2017 1350   K 4.2 04/26/2018 1356   K 3.7 02/27/2017 1350   CL 105 04/26/2018 1356   CO2 28 04/26/2018 1356   CO2 28 02/27/2017 1350   BUN 10 04/26/2018 1356   BUN 8.9 02/27/2017 1350   CREATININE 0.82 04/26/2018 1356   CREATININE 0.79 07/31/2017 1349   CREATININE 0.8 02/27/2017 1350      Component Value Date/Time   CALCIUM 9.1 04/26/2018 1356   CALCIUM 9.5 02/27/2017 1350   ALKPHOS 100 04/26/2018 1356   ALKPHOS 82 02/27/2017 1350   AST 27 04/26/2018 1356   AST 24 07/31/2017 1349   AST 21 02/27/2017 1350   ALT 24 04/26/2018 1356   ALT 19 07/31/2017 1349   ALT 25 02/27/2017 1350   BILITOT <0.2 (L) 04/26/2018 1356   BILITOT <0.2 (L) 07/31/2017 1349   BILITOT 0.22 02/27/2017 1350       No results found for: LABCA2  No components found for: LABCA125  No results for input(s): INR in the last 168 hours.  Urinalysis    Component Value Date/Time   COLORURINE YELLOW 10/14/2012 1954   APPEARANCEUR CLEAR 10/14/2012 1954   LABSPEC >1.030 (H) 10/14/2012 1954   PHURINE 6.0 10/14/2012 1954   GLUCOSEU NEGATIVE 10/14/2012 1954   HGBUR TRACE (A) 10/14/2012 [place] NEGATIVE 10/14/2012 1954   KETONESUR 15 (A) 10/14/2012 1954   PROTEINUR NEGATIVE 10/14/2012 1954   UROBILINOGEN 0.2 10/14/2012 1954   NITRITE NEGATIVE 10/14/2012 1954   LEUKOCYTESUR NEGATIVE 10/14/2012 1954     STUDIES: Estradiol level is appropriately low  ELIGIBLE FOR AVAILABLE RESEARCH PROTOCOL:  no   ASSESSMENT: 38 y.o. [place] woman status post left breast upper inner quadrant biopsy 01/15/2016 for a clinical T1c N0, stage IA invasive ductal carcinoma, E-cadherin positive, estrogen receptor 90% positive, progesterone receptor 40% positive, both with moderate staining intensity, with an MIB-1 of 5%, but HER-2 amplified, with a signals ratio of 2.41 and the number per cell 6.50.  (a) One of 2 areas of abnormal calcifications, the more posterior one measuring 0.2 cm, was also biopsied and showed only atypical ductal hyperplasia  (b) a 4.1 cm area of non-masslike enhancement in the left breast underwent biopsy 02/19/2016 showing only fibrocystic changes  (1) tamoxifen started 01/23/2016 neoadjuvantly  (2) genetics testing 02/22/2016 through the Breast/Ovarian gene panel offered by [facility] found no deleterious mutations in  ATM, BARD1, BRCA1, BRCA2, BRIP1, CDH1, CHEK2, EPCAM, FANCC, MLH1, MSH2, MSH6, NBN, PALB2, PMS2, PTEN, RAD51C, RAD51D, TP53, and XRCC2.     (3) status post left lumpectomy and sentinel lymph node sampling 03/13/2016 for an mpT1c pN0, stage IA invasive ductal carcinoma, grade 2, with negative margins.  (4) paclitaxel weekly 12 and trastuzumab every 21 days beginning on 04/10/2016.  (a) paclitaxel discontinued after 5 doses because of atypical neuropathy symptoms. Last dose 04/28/2016  (5) trastuzumab continued to complete a year--last dose 04/10/2017  (a) echocardiogram 02/11/2016 shows ejection fraction in the 60-65% range.  (b) echo 05/28/2916 shows EF in the 65-70% range  (c) echo on 09/26/2016 shows LVEF of 55-60%  (d) echo on 01/14/2017 shows EF in the 60-65%  (6) adjuvant radiation 06/11/16 - 07/23/16 Site/dose:  1) Left Breast / 50 Gy in 25 fractions 2) Left Breast Boost / 10 Gy in 5 fractions  (7) tamoxifen resumed to 07/25/2016  (8) patient resumed menses post chemo, April 2018  (a) goserelin started 06/17/2016, initially given every 28 days, however  changed to every 12 weeks on 11/12/16 with estradiol monitoring.   PLAN: Veida is now just over 2 years out from definitive surgery for her breast cancer with no evidence of disease activity.  This is very favorable.  She is tolerating the goserelin well, aside from the discomfort of the injections themselves, and the symptoms of menopause have abated.  She is doing a terrific job at cutting the carbs and has lost 25 pounds as a result since her last visit here.  She also has an excellent exercise program.  I think she will need additional bras and I have written for that.  I am refilling her Neurontin.  I am increasing her Effexor to 75 mg daily  Otherwise she will return for Zoladex in 3 and 6 months and see me with the August dose.  She knows to call for any other issues that may develop before the next visit.  Darlean Warmoth, Virgie Dad, MD  04/26/18 2:47 PM Medical Oncology and Hematology [facility] [address] [place], [place] [address] Tel. [phone]    Fax. [phone]  I, Jacqualyn Posey am acting as a Education administrator for Chauncey Cruel, MD.   I, Lurline Del MD, have reviewed the above documentation for accuracy and completeness, and I agree with the above.

## 2018-04-26 ENCOUNTER — Inpatient Hospital Stay (HOSPITAL_BASED_OUTPATIENT_CLINIC_OR_DEPARTMENT_OTHER): Payer: PRIVATE HEALTH INSURANCE | Admitting: Oncology

## 2018-04-26 ENCOUNTER — Telehealth: Payer: Self-pay | Admitting: Oncology

## 2018-04-26 ENCOUNTER — Inpatient Hospital Stay: Payer: PRIVATE HEALTH INSURANCE

## 2018-04-26 ENCOUNTER — Inpatient Hospital Stay: Payer: PRIVATE HEALTH INSURANCE | Attending: Adult Health

## 2018-04-26 VITALS — BP 121/86 | HR 74 | Temp 98.3°F | Resp 18 | Ht 67.0 in | Wt 180.6 lb

## 2018-04-26 DIAGNOSIS — Z79899 Other long term (current) drug therapy: Secondary | ICD-10-CM

## 2018-04-26 DIAGNOSIS — Z791 Long term (current) use of non-steroidal anti-inflammatories (NSAID): Secondary | ICD-10-CM | POA: Diagnosis not present

## 2018-04-26 DIAGNOSIS — F419 Anxiety disorder, unspecified: Secondary | ICD-10-CM | POA: Insufficient documentation

## 2018-04-26 DIAGNOSIS — Z170 Estrogen receptor positive status [ER+]: Secondary | ICD-10-CM | POA: Insufficient documentation

## 2018-04-26 DIAGNOSIS — Z5111 Encounter for antineoplastic chemotherapy: Secondary | ICD-10-CM | POA: Diagnosis not present

## 2018-04-26 DIAGNOSIS — Z95828 Presence of other vascular implants and grafts: Secondary | ICD-10-CM

## 2018-04-26 DIAGNOSIS — Z79810 Long term (current) use of selective estrogen receptor modulators (SERMs): Secondary | ICD-10-CM

## 2018-04-26 DIAGNOSIS — Z803 Family history of malignant neoplasm of breast: Secondary | ICD-10-CM | POA: Diagnosis not present

## 2018-04-26 DIAGNOSIS — C50212 Malignant neoplasm of upper-inner quadrant of left female breast: Secondary | ICD-10-CM

## 2018-04-26 DIAGNOSIS — Z9221 Personal history of antineoplastic chemotherapy: Secondary | ICD-10-CM

## 2018-04-26 DIAGNOSIS — Z923 Personal history of irradiation: Secondary | ICD-10-CM

## 2018-04-26 LAB — COMPREHENSIVE METABOLIC PANEL
ALT: 24 U/L (ref 0–44)
AST: 27 U/L (ref 15–41)
Albumin: 3.9 g/dL (ref 3.5–5.0)
Alkaline Phosphatase: 100 U/L (ref 38–126)
Anion gap: 9 (ref 5–15)
BUN: 10 mg/dL (ref 6–20)
CO2: 28 mmol/L (ref 22–32)
Calcium: 9.1 mg/dL (ref 8.9–10.3)
Chloride: 105 mmol/L (ref 98–111)
Creatinine, Ser: 0.82 mg/dL (ref 0.44–1.00)
GFR calc Af Amer: >60 mL/min (ref >60)
GFR calc non Af Amer: >60 mL/min (ref >60)
Glucose, Bld: 87 mg/dL (ref 70–99)
Potassium: 4.2 mmol/L (ref 3.5–5.1)
Sodium: 142 mmol/L (ref 135–145)
Total Bilirubin: <0.2 mg/dL — ABNORMAL LOW (ref 0.3–1.2)
Total Protein: 7.3 g/dL (ref 6.5–8.1)

## 2018-04-26 LAB — CBC WITH DIFFERENTIAL/PLATELET
Abs Immature Granulocytes: 0.01 10*3/uL (ref 0.00–0.07)
Basophils Absolute: 0 10*3/uL (ref 0.0–0.1)
Basophils Relative: 1 %
Eosinophils Absolute: 0.1 10*3/uL (ref 0.0–0.5)
Eosinophils Relative: 2 %
HCT: 39.1 % (ref 36.0–46.0)
Hemoglobin: 12.2 g/dL (ref 12.0–15.0)
Immature Granulocytes: 0 %
Lymphocytes Relative: 51 %
Lymphs Abs: 2 10*3/uL (ref 0.7–4.0)
MCH: 27.2 pg (ref 26.0–34.0)
MCHC: 31.2 g/dL (ref 30.0–36.0)
MCV: 87.1 fL (ref 80.0–100.0)
Monocytes Absolute: 0.5 10*3/uL (ref 0.1–1.0)
Monocytes Relative: 12 %
Neutro Abs: 1.3 10*3/uL — ABNORMAL LOW (ref 1.7–7.7)
Neutrophils Relative %: 34 %
Platelets: 153 10*3/uL (ref 150–400)
RBC: 4.49 MIL/uL (ref 3.87–5.11)
RDW: 13 % (ref 11.5–15.5)
WBC: 3.9 10*3/uL — ABNORMAL LOW (ref 4.0–10.5)
nRBC: 0 % (ref 0.0–0.2)

## 2018-04-26 MED ORDER — GOSERELIN ACETATE 3.6 MG ~~LOC~~ IMPL
10.8000 mg | DRUG_IMPLANT | Freq: Once | SUBCUTANEOUS | Status: AC
  Administered 2018-04-26: 10.8 mg via SUBCUTANEOUS

## 2018-04-26 MED ORDER — VENLAFAXINE HCL ER 75 MG PO CP24: 75.0000 mg | ORAL_CAPSULE | Freq: Every day | ORAL | 4 refills | Status: DC

## 2018-04-26 MED ORDER — GABAPENTIN 300 MG PO CAPS: 300.0000 mg | ORAL_CAPSULE | Freq: Every day | ORAL | 4 refills | Status: DC

## 2018-04-26 NOTE — Telephone Encounter (Signed)
Gave avs and calender per los 04/26/2018.

## 2018-05-10 ENCOUNTER — Other Ambulatory Visit: Payer: Self-pay | Admitting: Oncology

## 2018-06-19 ENCOUNTER — Other Ambulatory Visit: Payer: Self-pay | Admitting: Nurse Practitioner

## 2018-07-12 ENCOUNTER — Other Ambulatory Visit: Payer: Self-pay

## 2018-07-12 ENCOUNTER — Inpatient Hospital Stay: Payer: PRIVATE HEALTH INSURANCE

## 2018-07-12 ENCOUNTER — Inpatient Hospital Stay: Payer: PRIVATE HEALTH INSURANCE | Attending: Adult Health

## 2018-07-12 DIAGNOSIS — Z923 Personal history of irradiation: Secondary | ICD-10-CM | POA: Diagnosis not present

## 2018-07-12 DIAGNOSIS — C50212 Malignant neoplasm of upper-inner quadrant of left female breast: Secondary | ICD-10-CM

## 2018-07-12 DIAGNOSIS — F419 Anxiety disorder, unspecified: Secondary | ICD-10-CM | POA: Diagnosis not present

## 2018-07-12 DIAGNOSIS — Z791 Long term (current) use of non-steroidal anti-inflammatories (NSAID): Secondary | ICD-10-CM | POA: Diagnosis not present

## 2018-07-12 DIAGNOSIS — Z170 Estrogen receptor positive status [ER+]: Secondary | ICD-10-CM | POA: Diagnosis not present

## 2018-07-12 DIAGNOSIS — Z79899 Other long term (current) drug therapy: Secondary | ICD-10-CM | POA: Insufficient documentation

## 2018-07-12 DIAGNOSIS — Z79810 Long term (current) use of selective estrogen receptor modulators (SERMs): Secondary | ICD-10-CM | POA: Insufficient documentation

## 2018-07-12 DIAGNOSIS — Z9221 Personal history of antineoplastic chemotherapy: Secondary | ICD-10-CM | POA: Insufficient documentation

## 2018-07-12 DIAGNOSIS — Z95828 Presence of other vascular implants and grafts: Secondary | ICD-10-CM

## 2018-07-12 LAB — CBC WITH DIFFERENTIAL/PLATELET
Abs Immature Granulocytes: 0 10*3/uL (ref 0.00–0.07)
Basophils Absolute: 0 10*3/uL (ref 0.0–0.1)
Basophils Relative: 1 %
Eosinophils Absolute: 0.1 10*3/uL (ref 0.0–0.5)
Eosinophils Relative: 3 %
HCT: 37.9 % (ref 36.0–46.0)
Hemoglobin: 11.7 g/dL — ABNORMAL LOW (ref 12.0–15.0)
Immature Granulocytes: 0 %
Lymphocytes Relative: 40 %
Lymphs Abs: 1.4 10*3/uL (ref 0.7–4.0)
MCH: 26.7 pg (ref 26.0–34.0)
MCHC: 30.9 g/dL (ref 30.0–36.0)
MCV: 86.5 fL (ref 80.0–100.0)
Monocytes Absolute: 0.4 10*3/uL (ref 0.1–1.0)
Monocytes Relative: 11 %
Neutro Abs: 1.5 10*3/uL — ABNORMAL LOW (ref 1.7–7.7)
Neutrophils Relative %: 45 %
Platelets: 152 10*3/uL (ref 150–400)
RBC: 4.38 MIL/uL (ref 3.87–5.11)
RDW: 13.2 % (ref 11.5–15.5)
WBC: 3.4 10*3/uL — ABNORMAL LOW (ref 4.0–10.5)
nRBC: 0 % (ref 0.0–0.2)

## 2018-07-12 LAB — COMPREHENSIVE METABOLIC PANEL
ALT: 38 U/L (ref 0–44)
AST: 27 U/L (ref 15–41)
Albumin: 3.8 g/dL (ref 3.5–5.0)
Alkaline Phosphatase: 83 U/L (ref 38–126)
Anion gap: 7 (ref 5–15)
BUN: 10 mg/dL (ref 6–20)
CO2: 30 mmol/L (ref 22–32)
Calcium: 9.1 mg/dL (ref 8.9–10.3)
Chloride: 106 mmol/L (ref 98–111)
Creatinine, Ser: 0.8 mg/dL (ref 0.44–1.00)
GFR calc Af Amer: >60 mL/min (ref >60)
GFR calc non Af Amer: >60 mL/min (ref >60)
Glucose, Bld: 81 mg/dL (ref 70–99)
Potassium: 4 mmol/L (ref 3.5–5.1)
Sodium: 143 mmol/L (ref 135–145)
Total Bilirubin: <0.2 mg/dL — ABNORMAL LOW (ref 0.3–1.2)
Total Protein: 7 g/dL (ref 6.5–8.1)

## 2018-07-12 MED ORDER — GOSERELIN ACETATE 3.6 MG ~~LOC~~ IMPL
10.8000 mg | DRUG_IMPLANT | Freq: Once | SUBCUTANEOUS | Status: AC
  Administered 2018-07-12: 10.8 mg via SUBCUTANEOUS

## 2018-07-12 NOTE — Patient Instructions (Signed)
Goserelin injection What is this medicine? GOSERELIN (GOE se rel in) is similar to a hormone found in the body. It lowers the amount of sex hormones that the body makes. Men will have lower testosterone levels and women will have lower estrogen levels while taking this medicine. In men, this medicine is used to treat prostate cancer; the injection is either given once per month or once every 12 weeks. A once per month injection (only) is used to treat women with endometriosis, dysfunctional uterine bleeding, or advanced breast cancer. This medicine may be used for other purposes; ask your health care provider or pharmacist if you have questions. COMMON BRAND NAME(S): Zoladex What should I tell my health care provider before I take this medicine? They need to know if you have any of these conditions (some only apply to women): -diabetes -heart disease or previous heart attack -high blood pressure -high cholesterol -kidney disease -osteoporosis or low bone density -problems passing urine -spinal cord injury -stroke -tobacco smoker -an unusual or allergic reaction to goserelin, hormone therapy, other medicines, foods, dyes, or preservatives -pregnant or trying to get pregnant -breast-feeding How should I use this medicine? This medicine is for injection under the skin. It is given by a health care professional in a hospital or clinic setting. Men receive this injection once every 4 weeks or once every 12 weeks. Women will only receive the once every 4 weeks injection. Talk to your pediatrician regarding the use of this medicine in children. Special care may be needed. Overdosage: If you think you have taken too much of this medicine contact a poison control center or emergency room at once. NOTE: This medicine is only for you. Do not share this medicine with others. What if I miss a dose? It is important not to miss your dose. Call your doctor or health care professional if you are unable to  keep an appointment. What may interact with this medicine? -female hormones like estrogen -herbal or dietary supplements like black cohosh, chasteberry, or DHEA -female hormones like testosterone -prasterone This list may not describe all possible interactions. Give your health care provider a list of all the medicines, herbs, non-prescription drugs, or dietary supplements you use. Also tell them if you smoke, drink alcohol, or use illegal drugs. Some items may interact with your medicine. What should I watch for while using this medicine? Visit your doctor or health care professional for regular checks on your progress. Your symptoms may appear to get worse during the first weeks of this therapy. Tell your doctor or healthcare professional if your symptoms do not start to get better or if they get worse after this time. Your bones may get weaker if you take this medicine for a long time. If you smoke or frequently drink alcohol you may increase your risk of bone loss. A family history of osteoporosis, chronic use of drugs for seizures (convulsions), or corticosteroids can also increase your risk of bone loss. Talk to your doctor about how to keep your bones strong. This medicine should stop regular monthly menstration in women. Tell your doctor if you continue to menstrate. Women should not become pregnant while taking this medicine or for 12 weeks after stopping this medicine. Women should inform their doctor if they wish to become pregnant or think they might be pregnant. There is a potential for serious side effects to an unborn child. Talk to your health care professional or pharmacist for more information. Do not breast-feed an infant while taking   this medicine. Men should inform their doctors if they wish to father a child. This medicine may lower sperm counts. Talk to your health care professional or pharmacist for more information. What side effects may I notice from receiving this  medicine? Side effects that you should report to your doctor or health care professional as soon as possible: -allergic reactions like skin rash, itching or hives, swelling of the face, lips, or tongue -bone pain -breathing problems -changes in vision -chest pain -feeling faint or lightheaded, falls -fever, chills -pain, swelling, warmth in the leg -pain, tingling, numbness in the hands or feet -signs and symptoms of low blood pressure like dizziness; feeling faint or lightheaded, falls; unusually weak or tired -stomach pain -swelling of the ankles, feet, hands -trouble passing urine or change in the amount of urine -unusually high or low blood pressure -unusually weak or tired Side effects that usually do not require medical attention (report to your doctor or health care professional if they continue or are bothersome): -change in sex drive or performance -changes in breast size in both males and females -changes in emotions or moods -headache -hot flashes -irritation at site where injected -loss of appetite -skin problems like acne, dry skin -vaginal dryness This list may not describe all possible side effects. Call your doctor for medical advice about side effects. You may report side effects to FDA at 1-800-FDA-1088. Where should I keep my medicine? This drug is given in a hospital or clinic and will not be stored at home. NOTE: This sheet is a summary. It may not cover all possible information. If you have questions about this medicine, talk to your doctor, pharmacist, or health care provider.  2018 [employer] Standard (2013-04-19 11:10:35)  

## 2018-09-27 ENCOUNTER — Ambulatory Visit: Payer: Self-pay

## 2018-09-27 ENCOUNTER — Ambulatory Visit: Payer: PRIVATE HEALTH INSURANCE | Admitting: Adult Health

## 2018-09-27 ENCOUNTER — Other Ambulatory Visit: Payer: PRIVATE HEALTH INSURANCE

## 2018-09-27 ENCOUNTER — Ambulatory Visit: Payer: PRIVATE HEALTH INSURANCE

## 2018-09-29 ENCOUNTER — Other Ambulatory Visit: Payer: Self-pay

## 2018-09-29 ENCOUNTER — Encounter: Payer: Self-pay | Admitting: Adult Health

## 2018-09-29 ENCOUNTER — Inpatient Hospital Stay: Payer: PRIVATE HEALTH INSURANCE | Attending: Adult Health

## 2018-09-29 ENCOUNTER — Telehealth: Payer: Self-pay | Admitting: Oncology

## 2018-09-29 ENCOUNTER — Inpatient Hospital Stay: Payer: PRIVATE HEALTH INSURANCE

## 2018-09-29 ENCOUNTER — Inpatient Hospital Stay (HOSPITAL_BASED_OUTPATIENT_CLINIC_OR_DEPARTMENT_OTHER): Payer: PRIVATE HEALTH INSURANCE | Admitting: Adult Health

## 2018-09-29 VITALS — BP 129/72 | HR 72 | Temp 98.7°F | Resp 20 | Ht 67.0 in | Wt 174.2 lb

## 2018-09-29 DIAGNOSIS — Z9221 Personal history of antineoplastic chemotherapy: Secondary | ICD-10-CM | POA: Diagnosis not present

## 2018-09-29 DIAGNOSIS — Z923 Personal history of irradiation: Secondary | ICD-10-CM | POA: Diagnosis not present

## 2018-09-29 DIAGNOSIS — C50212 Malignant neoplasm of upper-inner quadrant of left female breast: Secondary | ICD-10-CM | POA: Diagnosis present

## 2018-09-29 DIAGNOSIS — Z79810 Long term (current) use of selective estrogen receptor modulators (SERMs): Secondary | ICD-10-CM | POA: Insufficient documentation

## 2018-09-29 DIAGNOSIS — Z170 Estrogen receptor positive status [ER+]: Secondary | ICD-10-CM

## 2018-09-29 DIAGNOSIS — Z95828 Presence of other vascular implants and grafts: Secondary | ICD-10-CM

## 2018-09-29 LAB — COMPREHENSIVE METABOLIC PANEL
ALT: 38 U/L (ref 0–44)
AST: 25 U/L (ref 15–41)
Albumin: 4.2 g/dL (ref 3.5–5.0)
Alkaline Phosphatase: 88 U/L (ref 38–126)
Anion gap: 10 (ref 5–15)
BUN: 8 mg/dL (ref 6–20)
CO2: 28 mmol/L (ref 22–32)
Calcium: 9.7 mg/dL (ref 8.9–10.3)
Chloride: 103 mmol/L (ref 98–111)
Creatinine, Ser: 0.86 mg/dL (ref 0.44–1.00)
GFR calc Af Amer: >60 mL/min (ref >60)
GFR calc non Af Amer: >60 mL/min (ref >60)
Glucose, Bld: 83 mg/dL (ref 70–99)
Potassium: 4.2 mmol/L (ref 3.5–5.1)
Sodium: 141 mmol/L (ref 135–145)
Total Bilirubin: 0.3 mg/dL (ref 0.3–1.2)
Total Protein: 7.4 g/dL (ref 6.5–8.1)

## 2018-09-29 LAB — CBC WITH DIFFERENTIAL/PLATELET
Abs Immature Granulocytes: 0.01 10*3/uL (ref 0.00–0.07)
Basophils Absolute: 0 10*3/uL (ref 0.0–0.1)
Basophils Relative: 1 %
Eosinophils Absolute: 0.1 10*3/uL (ref 0.0–0.5)
Eosinophils Relative: 2 %
HCT: 40.5 % (ref 36.0–46.0)
Hemoglobin: 12.8 g/dL (ref 12.0–15.0)
Immature Granulocytes: 0 %
Lymphocytes Relative: 45 %
Lymphs Abs: 1.9 10*3/uL (ref 0.7–4.0)
MCH: 27.4 pg (ref 26.0–34.0)
MCHC: 31.6 g/dL (ref 30.0–36.0)
MCV: 86.5 fL (ref 80.0–100.0)
Monocytes Absolute: 0.5 10*3/uL (ref 0.1–1.0)
Monocytes Relative: 10 %
Neutro Abs: 1.8 10*3/uL (ref 1.7–7.7)
Neutrophils Relative %: 42 %
Platelets: 168 10*3/uL (ref 150–400)
RBC: 4.68 MIL/uL (ref 3.87–5.11)
RDW: 12.9 % (ref 11.5–15.5)
WBC: 4.3 10*3/uL (ref 4.0–10.5)
nRBC: 0 % (ref 0.0–0.2)

## 2018-09-29 MED ORDER — GOSERELIN ACETATE 10.8 MG ~~LOC~~ IMPL
10.8000 mg | DRUG_IMPLANT | Freq: Once | SUBCUTANEOUS | Status: AC
  Administered 2018-09-29: 10.8 mg via SUBCUTANEOUS
  Filled 2018-09-29: qty 10.8

## 2018-09-29 NOTE — Telephone Encounter (Signed)
I left a message regarding schedule  

## 2018-09-29 NOTE — Progress Notes (Signed)
[facility]  Telephone:([phone] Fax:([phone]) [phone]    ID: FAYLENE ALLERTON DOB: Dec 31, 1980  MR#: [id number]  YFV#:[id number]  Patient Care Team: [name], MD as PCP - General (Family Medicine) Stark Klein, MD as Consulting Physician (General Surgery) Magrinat, Virgie Dad, MD as Consulting Physician (Oncology) Eppie Gibson, MD as Attending Physician (Radiation Oncology) Gardenia Phlegm, NP as Nurse Practitioner (Hematology and Oncology) OTHER MD:   CHIEF COMPLAINT:  Estrogen receptor positive breast cancer  CURRENT TREATMENT: goserelin, tamoxifen   BREAST CANCER HISTORY: From the original intake note:  Genasis noted some itching on her breasts and since this was the symptom that preceded her grandmother's breast cancer she underwent baseline bilateral screening mammography at [facility] 01/10/2016. The breast density was category B. In the left breast central to the nipple there was a 1.7 cm irregular high density mass with pleomorphic calcifications. Accordingly the patient was brought back 01/14/2016 for left diagnostic mammography and ultrasonography. This confirmed a 0.8 cm irregular mass in the left breast at the 10:00 position. 0.5 cm from the nipple lateral and posterior to that there was a 0.2 cm cluster of amorphous calcifications with an associated density. Anterior to the index mass was a 0.5 cm cluster of amorphous calcifications. All 3 findings taken together 3 cm. Ultrasound of the left breast found a 1 cm irregular mass in the left breast at the 10:00 position retroareolarly. There were no other sonographic abnormalities and the left axilla was sonographically benign  On 01/15/2016 the patient underwent core needle biopsy of the left breast index lesion at 10:00, and this found invasive ductal carcinoma, E-cadherin positive, estrogen receptor 90% positive, progesterone receptor 40% positive, both with moderate staining intensity, with an MIB-1 of  5%, but HER-2 amplified, with a signals ratio of 2.41 and the number per cell 6.50. One of the 2 areas of abnormal calcifications, the more posterior one measuring 0.2 cm, was also biopsied and showed only atypical ductal hyperplasia ([id number]).  Her subsequent history is as detailed below   INTERVAL HISTORY: Shakeitha returns today for follow-up and treatment of her triple positive breast cancer.   She continues on tamoxifen. Her vaginal wetness is stable.  She has had some increased hot flashes recently, however it has been increasingly humid this past month as well.    She also continues on goserelin. She is receiving these every three months.     REVIEW OF SYSTEMS: Cerys is adjusting to life with the current pandemic.  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  She is doing well otherwise.  She has no breast changes.  She has no new pain.  She is without cough, shortness of breath, chest pain, or palpitations.  She has no nausea, vomiting, bowel/bladder changes.  A detailed ROS was otherwise non contributory.     PAST MEDICAL HISTORY: Past Medical History:  Diagnosis Date  . Anemia    as a child  . Anxiety   . Cholestasis   . Elevated liver enzymes    during pregnancy March 2015  . Family history of breast cancer   . Family history of prostate cancer   . GERD (gastroesophageal reflux disease)    one episode  . Headache   . History of radiation therapy 06/11/16- 07/23/16   Left Breast 50 Gy in 25 fractions, Left Breast boost 10 Gy in 5 fractions.   . Hypothyroidism   . Lymphedema  of arm    LEFT  . Malignant neoplasm of upper-inner quadrant of left female breast ([place]) 01/16/2016    PAST SURGICAL HISTORY: Past Surgical History:  Procedure Laterality Date  . BREAST LUMPECTOMY WITH RADIOACTIVE SEED AND SENTINEL LYMPH NODE BIOPSY Left 03/13/2016   Procedure: LEFT BREAST LUMPECTOMY WITH RADIOACTIVE SEED X 2 AND SENTINEL  LYMPH NODE BIOPSY;  Surgeon: Stark Klein, MD;  Location: [place];  Service: General;  Laterality: Left;  . NO PAST SURGERIES    . PORT-A-CATH REMOVAL N/A 04/16/2017   Procedure: REMOVAL PORT-A-CATH;  Surgeon: Stark Klein, MD;  Location: [facility];  Service: General;  Laterality: N/A;  . PORTACATH PLACEMENT Right 03/13/2016   Procedure: INSERTION PORT-A-CATH;  Surgeon: Stark Klein, MD;  Location: [facility] [place];  Service: General;  Laterality: Right;    FAMILY HISTORY: Family History  Problem Relation Age of Onset  . Hypertension Mother   . Hypertension Father   . Prostate cancer Father   . Liver cancer Maternal Grandmother   . Breast cancer Paternal Grandmother        dx in her 39s  . Lung cancer Paternal Aunt        smoker  . Heart attack Paternal Grandfather   . Liver cancer Other    The patient's parents are alive, in their mid 68s as of November 2017. The patient father was diagnosed with prostate cancer at age 21. A paternal aunt was diagnosed with lung cancer in her late 62s. Also the paternal grandmother was diagnosed with breast cancer at the age of 61. On the mother's side there are 2 cases of liver cancer, age of diagnosis unknown.   GYNECOLOGIC HISTORY:  No LMP recorded.  Menarche age 40, first live birth age 67, she is [place] P2. She still having periods, most recently 01/21/2016, but they are no longer regular. She remotely used a number ring and oral contraceptives but stopped in 2016.Currently there use barrier methods for contraception.   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 Her husband [name] works as a Printmaker for [employer] (for example he worked on our (). Their children are [name] age 19 and in 8th grade and [name] age 39 and in [place] as of September 2019. Caty is not a church attender   ADVANCED DIRECTIVES: Not in place   HEALTH MAINTENANCE: Social History   Tobacco Use  . Smoking status:  Never Smoker  . Smokeless tobacco: Never Used  Substance Use Topics  . Alcohol use: No  . Drug use: No     Colonoscopy:  PAP:2016  Bone density:   Allergies  Allergen Reactions  . Amoxicillin Diarrhea and Nausea Only    Has patient had a PCN reaction causing immediate rash, facial/tongue/throat swelling, SOB or lightheadedness with hypotension: No Has patient had a PCN reaction causing severe rash involving mucus membranes or skin necrosis: No Has patient had a PCN reaction that required hospitalization No Has patient had a PCN reaction occurring within the last 10 years: Yes If all of the above answers are "NO", then may proceed with Cephalosporin use.     Current Outpatient Medications  Medication Sig Dispense Refill  . acetaminophen (TYLENOL) 500 MG tablet Take 500 mg by mouth every 6 (six) hours as needed for headache.     Marland Kitchen amLODipine (NORVASC) 5 MG tablet Take 5 mg by mouth daily.    Marland Kitchen b complex vitamins tablet Take 1 tablet  by mouth daily.    Marland Kitchen gabapentin (NEURONTIN) 300 MG capsule Take 1 capsule (300 mg total) by mouth at bedtime. 90 capsule 4  . goserelin (ZOLADEX) 10.8 MG injection Inject 10.8 mg into the skin every 3 (three) months. She is not sure of the exact dose     . ibuprofen (ADVIL,MOTRIN) 200 MG tablet Take 400 mg by mouth every 8 (eight) hours as needed for mild pain.     . Multiple Vitamins-Minerals (MULTIVITAMIN GUMMIES ADULT PO) Take 2 tablets by mouth daily.    . tamoxifen (NOLVADEX) 20 MG tablet TAKE 1 TABLET DAILY BY MOUTH. 90 tablet 3  . venlafaxine XR (EFFEXOR-XR) 75 MG 24 hr capsule Take 1 capsule (75 mg total) by mouth daily with breakfast. 90 capsule 4   No current facility-administered medications for this visit.     OBJECTIVE:  Vitals:   09/29/18 1018  BP: 129/72  Pulse: 72  Resp: 20  Temp: 98.7 F (37.1 C)  SpO2: 99%     Body mass index is 27.28 kg/m.    ECOG FS:1 - Symptomatic but completely ambulatory  GENERAL: Patient is a well  appearing female in no acute distress HEENT:  Sclerae anicteric.  Oropharynx clear and moist. No ulcerations or evidence of oropharyngeal candidiasis. Neck is supple.  NODES:  No cervical, supraclavicular, or axillary lymphadenopathy palpated.  BREAST EXAM:  Left breast s/p lumpectomy and radiation, no sign of local recurrence, right breast benign LUNGS:  Clear to auscultation bilaterally.  No wheezes or rhonchi. HEART:  Regular rate and rhythm. No murmur appreciated. ABDOMEN:  Soft, nontender.  Positive, normoactive bowel sounds. No organomegaly palpated. MSK:  No focal spinal tenderness to palpation. Full range of motion bilaterally in the upper extremities. EXTREMITIES:  No peripheral edema.   SKIN:  Clear with no obvious rashes or skin changes. No nail dyscrasia. NEURO:  Nonfocal. Well oriented.  Appropriate affect.     LAB RESULTS:  CMP     Component Value Date/Time   NA 141 09/29/2018 0941   NA 142 02/27/2017 1350   K 4.2 09/29/2018 0941   K 3.7 02/27/2017 1350   CL 103 09/29/2018 0941   CO2 28 09/29/2018 0941   CO2 28 02/27/2017 1350   GLUCOSE 83 09/29/2018 0941   GLUCOSE 124 02/27/2017 1350   BUN 8 09/29/2018 0941   BUN 8.9 02/27/2017 1350   CREATININE 0.86 09/29/2018 0941   CREATININE 0.79 07/31/2017 1349   CREATININE 0.8 02/27/2017 1350   CALCIUM 9.7 09/29/2018 0941   CALCIUM 9.5 02/27/2017 1350   PROT 7.4 09/29/2018 0941   PROT 7.3 02/27/2017 1350   ALBUMIN 4.2 09/29/2018 0941   ALBUMIN 3.8 02/27/2017 1350   AST 25 09/29/2018 0941   AST 24 07/31/2017 1349   AST 21 02/27/2017 1350   ALT 38 09/29/2018 0941   ALT 19 07/31/2017 1349   ALT 25 02/27/2017 1350   ALKPHOS 88 09/29/2018 0941   ALKPHOS 82 02/27/2017 1350   BILITOT 0.3 09/29/2018 0941   BILITOT <0.2 (L) 07/31/2017 1349   BILITOT 0.22 02/27/2017 1350   GFRNONAA >60 09/29/2018 0941   GFRNONAA >60 07/31/2017 1349   GFRAA >60 09/29/2018 0941   GFRAA >60 07/31/2017 1349    INo results found for:  SPEP, UPEP  Lab Results  Component Value Date   WBC 4.3 09/29/2018   NEUTROABS 1.8 09/29/2018   HGB 12.8 09/29/2018   HCT 40.5 09/29/2018   MCV 86.5 09/29/2018   PLT 168  09/29/2018      Chemistry      Component Value Date/Time   NA 141 09/29/2018 0941   NA 142 02/27/2017 1350   K 4.2 09/29/2018 0941   K 3.7 02/27/2017 1350   CL 103 09/29/2018 0941   CO2 28 09/29/2018 0941   CO2 28 02/27/2017 1350   BUN 8 09/29/2018 0941   BUN 8.9 02/27/2017 1350   CREATININE 0.86 09/29/2018 0941   CREATININE 0.79 07/31/2017 1349   CREATININE 0.8 02/27/2017 1350      Component Value Date/Time   CALCIUM 9.7 09/29/2018 0941   CALCIUM 9.5 02/27/2017 1350   ALKPHOS 88 09/29/2018 0941   ALKPHOS 82 02/27/2017 1350   AST 25 09/29/2018 0941   AST 24 07/31/2017 1349   AST 21 02/27/2017 1350   ALT 38 09/29/2018 0941   ALT 19 07/31/2017 1349   ALT 25 02/27/2017 1350   BILITOT 0.3 09/29/2018 0941   BILITOT <0.2 (L) 07/31/2017 1349   BILITOT 0.22 02/27/2017 1350       No results found for: LABCA2  No components found for: LABCA125  No results for input(s): INR in the last 168 hours.  Urinalysis    Component Value Date/Time   COLORURINE YELLOW 10/14/2012 1954   APPEARANCEUR CLEAR 10/14/2012 1954   LABSPEC >1.030 (H) 10/14/2012 1954   PHURINE 6.0 10/14/2012 1954   GLUCOSEU NEGATIVE 10/14/2012 1954   HGBUR TRACE (A) 10/14/2012 1954   BILIRUBINUR NEGATIVE 10/14/2012 1954   KETONESUR 15 (A) 10/14/2012 1954   PROTEINUR NEGATIVE 10/14/2012 1954   UROBILINOGEN 0.2 10/14/2012 1954   NITRITE NEGATIVE 10/14/2012 1954   LEUKOCYTESUR NEGATIVE 10/14/2012 1954     STUDIES:   ELIGIBLE FOR AVAILABLE RESEARCH PROTOCOL: no   ASSESSMENT: 38 y.o. [place] woman status post left breast upper inner quadrant biopsy 01/15/2016 for a clinical T1c N0, stage IA invasive ductal carcinoma, E-cadherin positive, estrogen receptor 90% positive, progesterone receptor 40% positive, both with moderate  staining intensity, with an MIB-1 of 5%, but HER-2 amplified, with a signals ratio of 2.41 and the number per cell 6.50.  (a) One of 2 areas of abnormal calcifications, the more posterior one measuring 0.2 cm, was also biopsied and showed only atypical ductal hyperplasia  (b) a 4.1 cm area of non-masslike enhancement in the left breast underwent biopsy 02/19/2016 showing only fibrocystic changes  (1) tamoxifen started 01/23/2016 neoadjuvantly  (2) genetics testing 02/22/2016 through the Breast/Ovarian gene panel offered by [facility] found no deleterious mutations in  ATM, BARD1, BRCA1, BRCA2, BRIP1, CDH1, CHEK2, EPCAM, FANCC, MLH1, MSH2, MSH6, NBN, PALB2, PMS2, PTEN, RAD51C, RAD51D, TP53, and XRCC2.     (3) status post left lumpectomy and sentinel lymph node sampling 03/13/2016 for an mpT1c pN0, stage IA invasive ductal carcinoma, grade 2, with negative margins.  (4) paclitaxel weekly 12 and trastuzumab every 21 days beginning on 04/10/2016.  (a) paclitaxel discontinued after 5 doses because of atypical neuropathy symptoms. Last dose 04/28/2016  (5) trastuzumab continued to complete a year--last dose 04/10/2017  (a) echocardiogram 02/11/2016 shows ejection fraction in the 60-65% range.  (b) echo 05/28/2916 shows EF in the 65-70% range  (c) echo on 09/26/2016 shows LVEF of 55-60%  (d) echo on 01/14/2017 shows EF in the 60-65%  (6) adjuvant radiation 06/11/16 - 07/23/16 Site/dose:    1) Left Breast / 50 Gy in 25 fractions 2) Left Breast Boost / 10 Gy in 5 fractions  (7) tamoxifen resumed to 07/25/2016  (8) patient resumed menses post  chemo, April 2018  (a) goserelin started 06/17/2016, initially given every 28 days, however changed to every 12 weeks on 11/12/16 with estradiol monitoring.   PLAN: Janay is doing moderately well today.  She will continue on Tamoxifen.  She is tolerating this well.  She has no signs of breast cancer recurrence.    Her last mamogram in 12/2017 was normal.  She  will be due for a repeat mammogram in 12/2018.  Murlene was recommended healthy diet and exercise.  We will check her estradiol with her next injection.  [place] will return in 3 months and in 6 months for her Goserelin.  We will see her in 6 months prior to her Goserelin.  She was recommended to continue with the appropriate pandemic precautions.  She knows to call for any other issues that may develop before the next visit.  A total of (30) minutes of face-to-face time was spent with this patient with greater than 50% of that time in counseling and care-coordination.  Wilber Bihari, NP  09/29/18 10:43 AM Medical Oncology and Hematology [facility] [address] [place], [place] [address] Tel. [phone]    Fax. [phone]

## 2018-12-03 ENCOUNTER — Other Ambulatory Visit: Payer: Self-pay | Admitting: Oncology

## 2018-12-22 ENCOUNTER — Inpatient Hospital Stay: Payer: PRIVATE HEALTH INSURANCE

## 2018-12-22 ENCOUNTER — Inpatient Hospital Stay: Payer: PRIVATE HEALTH INSURANCE | Attending: Adult Health

## 2018-12-22 ENCOUNTER — Other Ambulatory Visit: Payer: Self-pay

## 2018-12-22 VITALS — BP 123/76 | HR 70 | Temp 98.0°F | Resp 19

## 2018-12-22 DIAGNOSIS — Z95828 Presence of other vascular implants and grafts: Secondary | ICD-10-CM

## 2018-12-22 DIAGNOSIS — Z170 Estrogen receptor positive status [ER+]: Secondary | ICD-10-CM | POA: Diagnosis not present

## 2018-12-22 DIAGNOSIS — C50212 Malignant neoplasm of upper-inner quadrant of left female breast: Secondary | ICD-10-CM | POA: Insufficient documentation

## 2018-12-22 LAB — CBC WITH DIFFERENTIAL/PLATELET
Abs Immature Granulocytes: 0 10*3/uL (ref 0.00–0.07)
Basophils Absolute: 0.1 10*3/uL (ref 0.0–0.1)
Basophils Relative: 1 %
Eosinophils Absolute: 0.1 10*3/uL (ref 0.0–0.5)
Eosinophils Relative: 2 %
HCT: 38.1 % (ref 36.0–46.0)
Hemoglobin: 12.1 g/dL (ref 12.0–15.0)
Immature Granulocytes: 0 %
Lymphocytes Relative: 38 %
Lymphs Abs: 1.5 10*3/uL (ref 0.7–4.0)
MCH: 27.7 pg (ref 26.0–34.0)
MCHC: 31.8 g/dL (ref 30.0–36.0)
MCV: 87.2 fL (ref 80.0–100.0)
Monocytes Absolute: 0.4 10*3/uL (ref 0.1–1.0)
Monocytes Relative: 10 %
Neutro Abs: 1.9 10*3/uL (ref 1.7–7.7)
Neutrophils Relative %: 49 %
Platelets: 175 10*3/uL (ref 150–400)
RBC: 4.37 MIL/uL (ref 3.87–5.11)
RDW: 13.2 % (ref 11.5–15.5)
WBC: 4 10*3/uL (ref 4.0–10.5)
nRBC: 0 % (ref 0.0–0.2)

## 2018-12-22 LAB — COMPREHENSIVE METABOLIC PANEL
ALT: 16 U/L (ref 0–44)
AST: 16 U/L (ref 15–41)
Albumin: 3.8 g/dL (ref 3.5–5.0)
Alkaline Phosphatase: 78 U/L (ref 38–126)
Anion gap: 10 (ref 5–15)
BUN: 10 mg/dL (ref 6–20)
CO2: 29 mmol/L (ref 22–32)
Calcium: 9.2 mg/dL (ref 8.9–10.3)
Chloride: 104 mmol/L (ref 98–111)
Creatinine, Ser: 0.81 mg/dL (ref 0.44–1.00)
GFR calc Af Amer: >60 mL/min (ref >60)
GFR calc non Af Amer: >60 mL/min (ref >60)
Glucose, Bld: 104 mg/dL — ABNORMAL HIGH (ref 70–99)
Potassium: 4.2 mmol/L (ref 3.5–5.1)
Sodium: 143 mmol/L (ref 135–145)
Total Bilirubin: <0.2 mg/dL — ABNORMAL LOW (ref 0.3–1.2)
Total Protein: 7 g/dL (ref 6.5–8.1)

## 2018-12-22 MED ORDER — GOSERELIN ACETATE 10.8 MG ~~LOC~~ IMPL
10.8000 mg | DRUG_IMPLANT | Freq: Once | SUBCUTANEOUS | Status: AC
  Administered 2018-12-22: 10.8 mg via SUBCUTANEOUS
  Filled 2018-12-22: qty 10.8

## 2018-12-22 NOTE — Addendum Note (Signed)
Addended by: Reyne Dumas E on: 12/22/2018 01:56 PM   Modules accepted: Orders

## 2018-12-22 NOTE — Patient Instructions (Signed)

## 2018-12-27 LAB — ESTRADIOL, ULTRA SENS: Estradiol, Sensitive: 11.3 pg/mL

## 2019-02-23 ENCOUNTER — Other Ambulatory Visit: Payer: Self-pay | Admitting: Oncology

## 2019-02-25 DIAGNOSIS — Z8616 Personal history of COVID-19: Secondary | ICD-10-CM

## 2019-02-25 HISTORY — DX: Personal history of COVID-19: Z86.16

## 2019-03-01 ENCOUNTER — Other Ambulatory Visit: Payer: Self-pay | Admitting: *Deleted

## 2019-03-01 MED ORDER — LIDOCAINE-PRILOCAINE 2.5-2.5 % EX CREA: 1.0000 "application " | TOPICAL_CREAM | CUTANEOUS | 2 refills | Status: DC | PRN

## 2019-03-03 ENCOUNTER — Other Ambulatory Visit: Payer: Self-pay | Admitting: Physician Assistant

## 2019-03-03 ENCOUNTER — Other Ambulatory Visit (HOSPITAL_COMMUNITY)
Admission: RE | Admit: 2019-03-03 | Discharge: 2019-03-03 | Disposition: A | Discharge status: Home / Self Care | Payer: PRIVATE HEALTH INSURANCE | Source: Ambulatory Visit | Attending: Physician Assistant | Admitting: Physician Assistant

## 2019-03-03 DIAGNOSIS — Z124 Encounter for screening for malignant neoplasm of cervix: Secondary | ICD-10-CM | POA: Insufficient documentation

## 2019-03-07 LAB — CYTOLOGY - PAP
Comment: NEGATIVE
Diagnosis: UNDETERMINED — AB
High risk HPV: NEGATIVE

## 2019-03-15 ENCOUNTER — Telehealth: Payer: Self-pay | Admitting: Oncology

## 2019-03-15 NOTE — Progress Notes (Signed)
[place]  [phone] [phone]    ID: Virginia [name] DOB: [birth year]  MR#: [id number]  YKD#:[id number]  Patient Care Team: [name], MD as PCP - General (Family Medicine) Stark Klein, MD as Consulting Physician (General Surgery) Chardai Gangemi, Virgie Dad, MD as Consulting Physician (Oncology) Eppie Gibson, MD as Attending Physician (Radiation Oncology) Delice Bison Charlestine Massed, NP as Nurse Practitioner (Hematology and Oncology) OTHER MD:   CHIEF COMPLAINT:  Estrogen receptor positive breast cancer  CURRENT TREATMENT: goserelin, tamoxifen   INTERVAL HISTORY: Virginia [name] returns today for follow-up and treatment of her triple positive breast cancer.   She continues on tamoxifen.  She continues to have vaginal wetness.  Occasionally there is a little bit of urine smell as well.  She has a stand-up desk and so while she is standing there are no problems but after she sits for about 30 minutes she really notices a problem.  There has been no obvious infection and she has had no fever.  She also continues on goserelin. She is receiving these every three months.  Clinically she has not noticed a difference and of course it is more convenient to come here every 3 months instead of every 6  Since her last visit, she underwent bilateral diagnostic mammography with tomography at [facility] on 01/12/2019 showing: breast density category B; no evidence of malignancy in either breast.   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  She does not enjoy anything.  She is very stressed because she has to work very long hours. Sshe feels she needs to do twice as much work or people will think she is not doing enough.  In addition of course she is totally dedicated to what she does and worries about the patient's service.  This is of course a very common situation.  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  She may be able to have enough credits to complete that by December 2022 but still that is a long way to go.  Quite aside from all this she has her family duties to do.  She is gaining some weight.  She is not exercising regularly although she tries to do some yoga.  A detailed review of systems was otherwise stable   BREAST CANCER HISTORY: From the original intake note:  Hadiyah noted some itching on her breasts and since this was the symptom that preceded her grandmother's breast cancer she underwent baseline bilateral screening mammography at [facility] 01/10/2016. The breast density was category B. In the left breast central to the nipple there was a 1.7 cm irregular high density mass with pleomorphic calcifications. Accordingly the patient was brought back 01/14/2016 for left diagnostic mammography and ultrasonography. This confirmed a 0.8 cm irregular mass in the left breast at the 10:00 position. 0.5 cm from the nipple lateral and posterior to that there was a 0.2 cm cluster of amorphous calcifications with an associated density. Anterior to the index mass was a 0.5 cm cluster of amorphous calcifications. All 3 findings taken together 3 cm. Ultrasound of the left breast found a 1 cm irregular mass in the left breast at the 10:00 position retroareolarly. There were no other sonographic abnormalities and the left axilla was sonographically benign  On 01/15/2016 the patient underwent core needle biopsy of the left breast index lesion at 10:00, and this found invasive ductal carcinoma, E-cadherin positive, estrogen receptor 90% positive, progesterone receptor 40%  positive, both with moderate staining intensity, with an MIB-1 of 5%, but HER-2 amplified, with a signals ratio of 2.41 and the number per cell 6.50. One of the 2 areas of abnormal calcifications, the more posterior one measuring 0.2 cm, was also biopsied and showed only atypical ductal hyperplasia (S [id number]).  Her  subsequent history is as detailed below   PAST MEDICAL HISTORY: Past Medical History:  Diagnosis Date  . Anemia    as a child  . Anxiety   . Cholestasis   . Elevated liver enzymes    during pregnancy March 2015  . Family history of breast cancer   . Family history of prostate cancer   . GERD (gastroesophageal reflux disease)    one episode  . Headache   . History of radiation therapy 06/11/16- 07/23/16   Left Breast 50 Gy in 25 fractions, Left Breast boost 10 Gy in 5 fractions.   . Hypothyroidism   . Lymphedema of arm    LEFT  . Malignant neoplasm of upper-inner quadrant of left female breast ([place]) 01/16/2016    PAST SURGICAL HISTORY: Past Surgical History:  Procedure Laterality Date  . BREAST LUMPECTOMY WITH RADIOACTIVE SEED AND SENTINEL LYMPH NODE BIOPSY Left 03/13/2016   Procedure: LEFT BREAST LUMPECTOMY WITH RADIOACTIVE SEED X 2 AND SENTINEL LYMPH NODE BIOPSY;  Surgeon: Stark Klein, MD;  Location: [place];  Service: General;  Laterality: Left;  . NO PAST SURGERIES    . PORT-A-CATH REMOVAL N/A 04/16/2017   Procedure: REMOVAL PORT-A-CATH;  Surgeon: Stark Klein, MD;  Location: [place];  Service: General;  Laterality: N/A;  . PORTACATH PLACEMENT Right 03/13/2016   Procedure: INSERTION PORT-A-CATH;  Surgeon: Stark Klein, MD;  Location: [place];  Service: General;  Laterality: Right;    FAMILY HISTORY: Family History  Problem Relation Age of Onset  . Hypertension Mother   . Hypertension Father   . Prostate cancer Father   . Liver cancer Maternal Grandmother   . Breast cancer Paternal Grandmother        dx in her 88s  . Lung cancer Paternal Aunt        smoker  . Heart attack Paternal Grandfather   . Liver cancer Other    The patient's parents are alive, in their mid 42s as of November 2017. The patient father was diagnosed with prostate cancer at age 66. A paternal aunt was diagnosed with lung cancer in her late 43s. Also the paternal grandmother was diagnosed with breast  cancer at the age of 33. On the mother's side there are 2 cases of liver cancer, age of diagnosis unknown.   GYNECOLOGIC HISTORY:  No LMP recorded.  Menarche age 89, first live birth age 46, she is [place] P2. She still having periods, most recently 01/21/2016, but they are no longer regular. She remotely used a number ring and oral contraceptives but stopped in 2016.Currently there use barrier methods for contraception.   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 Her husband [name] works as a Printmaker for [employer] (for example he worked on our (). Their children are [name] age 41 and in 8th grade and [name] age 86 and in [place] as of September 2019. Tomia is not a church attender   ADVANCED DIRECTIVES: Not in place   HEALTH MAINTENANCE: Social History   Tobacco Use  . Smoking status: Never Smoker  . Smokeless tobacco: Never Used  Substance Use Topics  . Alcohol use: No  . Drug use: No     Colonoscopy:  PAP:2016  Bone density:   Allergies  Allergen Reactions  . Amoxicillin Diarrhea and Nausea Only    Has patient had a PCN reaction causing immediate rash, facial/tongue/throat swelling, SOB or lightheadedness with hypotension: No Has patient had a PCN reaction causing severe rash involving mucus membranes or skin necrosis: No Has patient had a PCN reaction that required hospitalization No Has patient had a PCN reaction occurring within the last 10 years: Yes If all of the above answers are "NO", then may proceed with Cephalosporin use.     Current Outpatient Medications  Medication Sig Dispense Refill  . acetaminophen (TYLENOL) 500 MG tablet Take 500 mg by mouth every 6 (six) hours as needed for headache.     Marland Kitchen amLODipine (NORVASC) 5 MG tablet Take 5 mg by mouth daily.    Marland Kitchen b complex vitamins tablet Take 1 tablet by mouth daily.    Marland Kitchen gabapentin (NEURONTIN) 300 MG capsule TAKE 1 CAPSULE BY MOUTH DAILY AT  BEDTIME 90 capsule 3  . goserelin (ZOLADEX) 10.8 MG injection Inject 10.8 mg into the skin every 3 (three) months. She is not sure of the exact dose     . ibuprofen (ADVIL,MOTRIN) 200 MG tablet Take 400 mg by mouth every 8 (eight) hours as needed for mild pain.     Marland Kitchen lidocaine-prilocaine (EMLA) cream Apply 1 application topically as needed. 30 g 2  . Multiple Vitamins-Minerals (MULTIVITAMIN GUMMIES ADULT PO) Take 2 tablets by mouth daily.    . tamoxifen (NOLVADEX) 20 MG tablet TAKE 1 TABLET DAILY BY MOUTH. 90 tablet 3  . venlafaxine XR (EFFEXOR-XR) 150 MG 24 hr capsule Take 1 capsule (150 mg total) by mouth daily with breakfast. 60 capsule 6   No current facility-administered medications for this visit.   Facility-Administered Medications Ordered in Other Visits  Medication Dose Route Frequency Provider Last Rate Last Admin  . goserelin (ZOLADEX) injection 10.8 mg  10.8 mg Subcutaneous Once Wynetta Seith, Virgie Dad, MD        OBJECTIVE: Young African-American woman who was tearful during today's visit  Vitals:   03/16/19 1446  BP: 126/83  Pulse: 64  Resp: 18  Temp: 98.4 F (36.9 C)  SpO2: 100%     Body mass index is 28.55 kg/m.    ECOG FS:1 - Symptomatic but completely ambulatory   Sclerae unicteric, EOMs intact Wearing a mask No cervical or supraclavicular adenopathy Lungs no rales or rhonchi Heart regular rate and rhythm Abd soft, nontender, positive bowel sounds MSK no focal spinal tenderness, no upper extremity lymphedema Neuro: nonfocal, well oriented, dysthymic affect Breasts: The right breast is unremarkable.  The left breast is status post lumpectomy and radiation.  There is no evidence of local recurrence.  Both axillae are benign.   LAB RESULTS:  CMP     Component Value Date/Time   NA 145 03/16/2019 1432   NA 142 02/27/2017 1350   K 4.5 03/16/2019 1432   K 3.7 02/27/2017 1350   CL 107 03/16/2019 1432   CO2 32 03/16/2019 1432   CO2 28 02/27/2017 1350   GLUCOSE 93  03/16/2019 1432   GLUCOSE 124 02/27/2017 1350   BUN 9 03/16/2019 1432   BUN 8.9 02/27/2017 1350   CREATININE 0.76 03/16/2019 1432   CREATININE 0.79 07/31/2017 1349   CREATININE 0.8 02/27/2017 1350   CALCIUM 9.7 03/16/2019 1432   CALCIUM 9.5  02/27/2017 1350   PROT 7.5 03/16/2019 1432   PROT 7.3 02/27/2017 1350   ALBUMIN 4.0 03/16/2019 1432   ALBUMIN 3.8 02/27/2017 1350   AST 24 03/16/2019 1432   AST 24 07/31/2017 1349   AST 21 02/27/2017 1350   ALT 18 03/16/2019 1432   ALT 19 07/31/2017 1349   ALT 25 02/27/2017 1350   ALKPHOS 86 03/16/2019 1432   ALKPHOS 82 02/27/2017 1350   BILITOT <0.2 (L) 03/16/2019 1432   BILITOT <0.2 (L) 07/31/2017 1349   BILITOT 0.22 02/27/2017 1350   GFRNONAA >60 03/16/2019 1432   GFRNONAA >60 07/31/2017 1349   GFRAA >60 03/16/2019 1432   GFRAA >60 07/31/2017 1349    INo results found for: SPEP, UPEP  Lab Results  Component Value Date   WBC 3.9 (L) 03/16/2019   NEUTROABS 1.3 (L) 03/16/2019   HGB 12.4 03/16/2019   HCT 39.6 03/16/2019   MCV 87.8 03/16/2019   PLT 185 03/16/2019      Chemistry      Component Value Date/Time   NA 145 03/16/2019 1432   NA 142 02/27/2017 1350   K 4.5 03/16/2019 1432   K 3.7 02/27/2017 1350   CL 107 03/16/2019 1432   CO2 32 03/16/2019 1432   CO2 28 02/27/2017 1350   BUN 9 03/16/2019 1432   BUN 8.9 02/27/2017 1350   CREATININE 0.76 03/16/2019 1432   CREATININE 0.79 07/31/2017 1349   CREATININE 0.8 02/27/2017 1350      Component Value Date/Time   CALCIUM 9.7 03/16/2019 1432   CALCIUM 9.5 02/27/2017 1350   ALKPHOS 86 03/16/2019 1432   ALKPHOS 82 02/27/2017 1350   AST 24 03/16/2019 1432   AST 24 07/31/2017 1349   AST 21 02/27/2017 1350   ALT 18 03/16/2019 1432   ALT 19 07/31/2017 1349   ALT 25 02/27/2017 1350   BILITOT <0.2 (L) 03/16/2019 1432   BILITOT <0.2 (L) 07/31/2017 1349   BILITOT 0.22 02/27/2017 1350       No results found for: LABCA2  No components found for: LABCA125  No results for  input(s): INR in the last 168 hours.  Urinalysis    Component Value Date/Time   COLORURINE YELLOW 10/14/2012 1954   APPEARANCEUR CLEAR 10/14/2012 1954   LABSPEC >1.030 (H) 10/14/2012 1954   PHURINE 6.0 10/14/2012 1954   GLUCOSEU NEGATIVE 10/14/2012 1954   HGBUR TRACE (A) 10/14/2012 1954   BILIRUBINUR NEGATIVE 10/14/2012 1954   KETONESUR 15 (A) 10/14/2012 1954   PROTEINUR NEGATIVE 10/14/2012 1954   UROBILINOGEN 0.2 10/14/2012 1954   NITRITE NEGATIVE 10/14/2012 1954   LEUKOCYTESUR NEGATIVE 10/14/2012 1954     STUDIES: No results found.   ELIGIBLE FOR AVAILABLE RESEARCH PROTOCOL: no   ASSESSMENT: 39 y.o. [place] woman status post left breast upper inner quadrant biopsy 01/15/2016 for a clinical T1c N0, stage IA invasive ductal carcinoma, E-cadherin positive, estrogen receptor 90% positive, progesterone receptor 40% positive, both with moderate staining intensity, with an MIB-1 of 5%, but HER-2 amplified, with a signals ratio of 2.41 and the number per cell 6.50.  (a) One of 2 areas of abnormal calcifications, the more posterior one measuring 0.2 cm, was also biopsied and showed only atypical ductal hyperplasia  (b) a 4.1 cm area of non-masslike enhancement in the left breast underwent biopsy 02/19/2016 showing only fibrocystic changes  (1) tamoxifen started 01/23/2016 neoadjuvantly  (2) genetics testing 02/22/2016 through the Breast/Ovarian gene panel offered by [facility] found no deleterious mutations in  ATM,  BARD1, BRCA1, BRCA2, BRIP1, CDH1, CHEK2, EPCAM, FANCC, MLH1, MSH2, MSH6, NBN, PALB2, PMS2, PTEN, RAD51C, RAD51D, TP53, and XRCC2.     (3) status post left lumpectomy and sentinel lymph node sampling 03/13/2016 for an mpT1c pN0, stage IA invasive ductal carcinoma, grade 2, with negative margins.  (4) paclitaxel weekly 12 and trastuzumab every 21 days beginning on 04/10/2016.  (a) paclitaxel discontinued after 5 doses because of atypical neuropathy symptoms. Last dose  04/28/2016  (5) trastuzumab continued to complete a year--last dose 04/10/2017  (a) echocardiogram 02/11/2016 shows ejection fraction in the 60-65% range.  (b) echo 05/28/2916 shows EF in the 65-70% range  (c) echo on 09/26/2016 shows LVEF of 55-60%  (d) echo on 01/14/2017 shows EF in the 60-65%  (6) adjuvant radiation 06/11/16 - 07/23/16 Site/dose:    1) Left Breast / 50 Gy in 25 fractions 2) Left Breast Boost / 10 Gy in 5 fractions  (7) tamoxifen resumed to 07/25/2016  (8) patient resumed menses post chemo, April 2018  (a) goserelin started 06/17/2016, initially given every 28 days, however changed to every 12 weeks on 11/12/16 with estradiol monitoring.   PLAN: Virginia [name] is now 3 years out from definitive surgery, with no evidence of disease recurrence.  This is very favorable.  She is tolerating her treatment well and we are continuing the tamoxifen and goserelin.  She does have a vaginal wetness which is unusual in my patients who receive goserelin even if they are on tamoxifen.  I think we need some help and I am placing a referral to Dr. Sabra Heck in gynecology.  In addition states he is very stressed.  She works for 12-hour days a week, and of course does drag out to more than 12hours.  She is also studying towards being a Designer, jewellery.  In addition she has all her family duties.  Finally she is still dealing with the posttraumatic stress of a cancer diagnosis and early menopause.  This is allowed for anyone.  We are upping her venlafaxine 250 mg daily.  I have recommended she exercise at least 3 days a week for at least an hour.  I also wonder if it would not be better for her to work 3 rather than 4 days a week for the next 2 or 75month, and try to work through the posttraumatic stress issues we discussed today after which she may be able to go back to full-time.  I recommended she discuss all this with human resources and let me know if I can be of help  Otherwise she will return  to see me in 3 months with her next goserelin dose.  She knows to call for any other issue that may develop before then.  Total encounter time 30 minutes.*Sarajane JewsC. Yeshua Stryker, MD  03/16/19 3:35 PM Medical Oncology and Hematology [facility] [place] [id number]. [phone]   Fax. [phone]  I, KWilburn Mylar am acting as scribe for Dr. GVirgie Dad Sevin Farone.  I, GLurline DelMD, have reviewed the above documentation for accuracy and completeness, and I agree with the above.    *Total Encounter Time as defined by the Centers for Medicare and Medicaid Services includes, in addition to the face-to-face time of a patient visit (documented in the note above) non-face-to-face time: obtaining and reviewing outside history, ordering and reviewing medications, tests or procedures, care coordination (communications with other health care professionals or caregivers) and documentation in the medical  record.

## 2019-03-15 NOTE — Telephone Encounter (Signed)
Returned patient's phone call regarding rescheduling an appointment, per patient's request 01/20 appointment time has been rescheduled. Left a voicemail.

## 2019-03-16 ENCOUNTER — Encounter: Payer: Self-pay | Admitting: Oncology

## 2019-03-16 ENCOUNTER — Other Ambulatory Visit: Payer: PRIVATE HEALTH INSURANCE

## 2019-03-16 ENCOUNTER — Ambulatory Visit: Payer: PRIVATE HEALTH INSURANCE | Admitting: Oncology

## 2019-03-16 ENCOUNTER — Inpatient Hospital Stay (HOSPITAL_BASED_OUTPATIENT_CLINIC_OR_DEPARTMENT_OTHER): Payer: PRIVATE HEALTH INSURANCE | Admitting: Oncology

## 2019-03-16 ENCOUNTER — Ambulatory Visit: Payer: PRIVATE HEALTH INSURANCE

## 2019-03-16 ENCOUNTER — Inpatient Hospital Stay: Payer: PRIVATE HEALTH INSURANCE | Attending: Oncology

## 2019-03-16 ENCOUNTER — Other Ambulatory Visit: Payer: Self-pay

## 2019-03-16 ENCOUNTER — Inpatient Hospital Stay: Payer: PRIVATE HEALTH INSURANCE

## 2019-03-16 VITALS — BP 126/83 | HR 64 | Temp 98.4°F | Resp 18 | Ht 67.0 in | Wt 182.3 lb

## 2019-03-16 DIAGNOSIS — Z170 Estrogen receptor positive status [ER+]: Secondary | ICD-10-CM | POA: Diagnosis not present

## 2019-03-16 DIAGNOSIS — C50212 Malignant neoplasm of upper-inner quadrant of left female breast: Secondary | ICD-10-CM | POA: Diagnosis not present

## 2019-03-16 DIAGNOSIS — Z79810 Long term (current) use of selective estrogen receptor modulators (SERMs): Secondary | ICD-10-CM | POA: Insufficient documentation

## 2019-03-16 DIAGNOSIS — Z9221 Personal history of antineoplastic chemotherapy: Secondary | ICD-10-CM | POA: Insufficient documentation

## 2019-03-16 DIAGNOSIS — F419 Anxiety disorder, unspecified: Secondary | ICD-10-CM | POA: Diagnosis not present

## 2019-03-16 DIAGNOSIS — Z79899 Other long term (current) drug therapy: Secondary | ICD-10-CM | POA: Insufficient documentation

## 2019-03-16 DIAGNOSIS — Z95828 Presence of other vascular implants and grafts: Secondary | ICD-10-CM

## 2019-03-16 LAB — CBC WITH DIFFERENTIAL/PLATELET
Abs Immature Granulocytes: 0 10*3/uL (ref 0.00–0.07)
Basophils Absolute: 0.1 10*3/uL (ref 0.0–0.1)
Basophils Relative: 2 %
Eosinophils Absolute: 0.1 10*3/uL (ref 0.0–0.5)
Eosinophils Relative: 3 %
HCT: 39.6 % (ref 36.0–46.0)
Hemoglobin: 12.4 g/dL (ref 12.0–15.0)
Immature Granulocytes: 0 %
Lymphocytes Relative: 48 %
Lymphs Abs: 1.9 10*3/uL (ref 0.7–4.0)
MCH: 27.5 pg (ref 26.0–34.0)
MCHC: 31.3 g/dL (ref 30.0–36.0)
MCV: 87.8 fL (ref 80.0–100.0)
Monocytes Absolute: 0.5 10*3/uL (ref 0.1–1.0)
Monocytes Relative: 13 %
Neutro Abs: 1.3 10*3/uL — ABNORMAL LOW (ref 1.7–7.7)
Neutrophils Relative %: 34 %
Platelets: 185 10*3/uL (ref 150–400)
RBC: 4.51 MIL/uL (ref 3.87–5.11)
RDW: 13 % (ref 11.5–15.5)
WBC: 3.9 10*3/uL — ABNORMAL LOW (ref 4.0–10.5)
nRBC: 0 % (ref 0.0–0.2)

## 2019-03-16 LAB — COMPREHENSIVE METABOLIC PANEL WITH GFR
ALT: 18 U/L (ref 0–44)
AST: 24 U/L (ref 15–41)
Albumin: 4 g/dL (ref 3.5–5.0)
Alkaline Phosphatase: 86 U/L (ref 38–126)
Anion gap: 6 (ref 5–15)
BUN: 9 mg/dL (ref 6–20)
CO2: 32 mmol/L (ref 22–32)
Calcium: 9.7 mg/dL (ref 8.9–10.3)
Chloride: 107 mmol/L (ref 98–111)
Creatinine, Ser: 0.76 mg/dL (ref 0.44–1.00)
GFR calc Af Amer: >60 mL/min
GFR calc non Af Amer: >60 mL/min
Glucose, Bld: 93 mg/dL (ref 70–99)
Potassium: 4.5 mmol/L (ref 3.5–5.1)
Sodium: 145 mmol/L (ref 135–145)
Total Bilirubin: <0.2 mg/dL — ABNORMAL LOW (ref 0.3–1.2)
Total Protein: 7.5 g/dL (ref 6.5–8.1)

## 2019-03-16 MED ORDER — GOSERELIN ACETATE 10.8 MG ~~LOC~~ IMPL
10.8000 mg | DRUG_IMPLANT | Freq: Once | SUBCUTANEOUS | Status: AC
  Administered 2019-03-16: 10.8 mg via SUBCUTANEOUS
  Filled 2019-03-16: qty 10.8

## 2019-03-16 MED ORDER — VENLAFAXINE HCL ER 150 MG PO CP24: 150.0000 mg | ORAL_CAPSULE | Freq: Every day | ORAL | 6 refills | Status: DC

## 2019-03-16 NOTE — Patient Instructions (Signed)

## 2019-03-17 ENCOUNTER — Telehealth: Payer: Self-pay | Admitting: Oncology

## 2019-03-17 NOTE — Telephone Encounter (Signed)
I talk with patient regarding schedule  

## 2019-03-21 ENCOUNTER — Other Ambulatory Visit: Payer: Self-pay | Admitting: Oncology

## 2019-03-21 ENCOUNTER — Encounter: Payer: Self-pay | Admitting: Oncology

## 2019-03-22 LAB — ESTRADIOL, ULTRA SENS: Estradiol, Sensitive: 9.7 pg/mL

## 2019-03-28 ENCOUNTER — Other Ambulatory Visit: Payer: Self-pay

## 2019-03-28 ENCOUNTER — Ambulatory Visit (INDEPENDENT_AMBULATORY_CARE_PROVIDER_SITE_OTHER): Payer: PRIVATE HEALTH INSURANCE | Admitting: Obstetrics & Gynecology

## 2019-03-28 ENCOUNTER — Encounter: Payer: Self-pay | Admitting: Obstetrics & Gynecology

## 2019-03-28 VITALS — BP 128/76 | HR 84 | Temp 97.2°F | Resp 10 | Ht 67.0 in | Wt 182.8 lb

## 2019-03-28 DIAGNOSIS — Z79810 Long term (current) use of selective estrogen receptor modulators (SERMs): Secondary | ICD-10-CM

## 2019-03-28 DIAGNOSIS — Z170 Estrogen receptor positive status [ER+]: Secondary | ICD-10-CM

## 2019-03-28 DIAGNOSIS — N898 Other specified noninflammatory disorders of vagina: Secondary | ICD-10-CM

## 2019-03-28 DIAGNOSIS — C50212 Malignant neoplasm of upper-inner quadrant of left female breast: Secondary | ICD-10-CM

## 2019-03-28 NOTE — Progress Notes (Signed)
39 y.o. G74P2002 Married [place] American female here as a new patient referred from Dr. Jana Hakim for vaginal moisture/possible urinary incontinence.  Pt is not sure urinary incontinence is a part of this but does smell an ammonia smell at times.  She does not leak with cough or laugh or sneeze.  Stays she notices more moisture if she's been sitting vs standing.  She does sit, at times, with her work and does feel more moisture when she stands up.    Denies vaginal bleeding.  Denies vaginal burning or itching.  She has tried OTC yeast infection suppositories but this only helps temporarily.  Not currently SA but the last episode was about six months ago and there was pain.    Last pap was 03/03/2019 and was ASCUS with neg HR HPV.  Reviewed results and discussed with pt what this pap means and recommended 3 year follow up.  Does not have hx of cervical dysplasia.   Started Tamoxifen about six months after initial diagnosis.  She was still cycling a little bit at that time so Zoladex was added.  Now, she's had no bleeding since March 2018.    Patient's last menstrual period was 04/24/2016 (within months).          Sexually active: No.  The current method of family planning is none.    Exercising: No.  The patient does not participate in regular exercise at present. Smoker:  no  Health Maintenance: Pap:  03/03/19 ASCUS:Neg HR HPV -- done with Dr. Jodi Mourning office at [facility].   11/29/14 Neg History of abnormal Pap:  Yes but resolved with follow up [facility]:  Done in 2020 with [facility] -- patient has had left breast cancer -- lumpectomy done Colonoscopy:  n/a BMD:   n/a TDaP:  03/03/19 Pneumonia vaccine(s):  n/a Shingrix:   no Hep C testing: n/a Screening Labs: oncology   reports that she has never smoked. She has never used smokeless tobacco. She reports that she does not drink alcohol or use drugs.  Past Medical History:  Diagnosis Date  . Anemia    as a child  .  Anxiety   . Cholestasis   . Elevated liver enzymes    during pregnancy March 2015  . Family history of breast cancer   . Family history of prostate cancer   . GERD (gastroesophageal reflux disease)    one episode  . Headache   . History of radiation therapy 06/11/16- 07/23/16   Left Breast 50 Gy in 25 fractions, Left Breast boost 10 Gy in 5 fractions.   . Hypertension   . Hypothyroidism   . Lymphedema of arm    LEFT  . Malignant neoplasm of upper-inner quadrant of left female breast ([place]) 01/16/2016    Past Surgical History:  Procedure Laterality Date  . BREAST LUMPECTOMY WITH RADIOACTIVE SEED AND SENTINEL LYMPH NODE BIOPSY Left 03/13/2016   Procedure: LEFT BREAST LUMPECTOMY WITH RADIOACTIVE SEED X 2 AND SENTINEL LYMPH NODE BIOPSY;  Surgeon: Stark Klein, MD;  Location: [place];  Service: General;  Laterality: Left;  . NO PAST SURGERIES    . PORT-A-CATH REMOVAL N/A 04/16/2017   Procedure: REMOVAL PORT-A-CATH;  Surgeon: Stark Klein, MD;  Location: [place];  Service: General;  Laterality: N/A;  . PORTACATH PLACEMENT Right 03/13/2016   Procedure: INSERTION PORT-A-CATH;  Surgeon: Stark Klein, MD;  Location: [place];  Service: General;  Laterality: Right;    Current Outpatient Medications  Medication Sig  Dispense Refill  . acetaminophen (TYLENOL) 500 MG tablet Take 500 mg by mouth every 6 (six) hours as needed for headache.     Marland Kitchen amLODipine (NORVASC) 5 MG tablet Take 5 mg by mouth daily.    Marland Kitchen b complex vitamins tablet Take 1 tablet by mouth daily.    Marland Kitchen gabapentin (NEURONTIN) 300 MG capsule TAKE 1 CAPSULE BY MOUTH DAILY AT BEDTIME 90 capsule 3  . goserelin (ZOLADEX) 10.8 MG injection Inject 10.8 mg into the skin every 3 (three) months. She is not sure of the exact dose     . ibuprofen (ADVIL,MOTRIN) 200 MG tablet Take 400 mg by mouth every 8 (eight) hours as needed for mild pain.     Marland Kitchen lidocaine-prilocaine (EMLA) cream Apply 1 application topically as needed. 30 g 2  . Multiple  Vitamins-Minerals (MULTIVITAMIN GUMMIES ADULT PO) Take 2 tablets by mouth daily.    . Omega-3 Fatty Acids (FISH OIL) 1000 MG CAPS Take by mouth daily.    . tamoxifen (NOLVADEX) 20 MG tablet TAKE 1 TABLET DAILY BY MOUTH. 90 tablet 3  . venlafaxine XR (EFFEXOR-XR) 150 MG 24 hr capsule Take 1 capsule (150 mg total) by mouth daily with breakfast. 60 capsule 6   No current facility-administered medications for this visit.    Family History  Problem Relation Age of Onset  . Hypertension Mother   . Hypertension Father   . Prostate cancer Father   . Liver cancer Maternal Grandmother   . Breast cancer Paternal Grandmother        dx in her 32s  . Lung cancer Paternal Aunt        smoker  . Heart attack Paternal Grandfather   . Liver cancer Other     Review of Systems  Genitourinary: Positive for vaginal discharge.       Urinary incontinence  All other systems reviewed and are negative.   Exam:   BP 128/76 (BP Location: Right Arm, Patient Position: Sitting, Cuff Size: Large)   Pulse 84   Temp (!) 97.2 F (36.2 C) (Temporal)   Resp 10   Ht 5\' 7"  (1.702 m)   Wt 182 lb 12.8 oz (82.9 kg)   LMP 04/24/2016 (Within Months)   BMI 28.63 kg/m   Height: 5\' 7"  (170.2 cm)  Ht Readings from Last 3 Encounters:  03/28/19 5\' 7"  (1.702 m)  03/16/19 5\' 7"  (1.702 m)  09/29/18 5\' 7"  (1.702 m)    General appearance: alert, cooperative and appears stated age Breasts: normal appearance, no masses or tenderness  On right, well healed left breast scars, no masses/LAD Heart: regular rate and rhythm Abdomen: soft, non-tender; bowel sounds normal; no masses,  no organomegaly Extremities: extremities normal, atraumatic, no cyanosis or edema Skin: Skin color, texture, turgor normal. No rashes or lesions Lymph nodes: Cervical, supraclavicular, and axillary nodes normal. No abnormal inguinal nodes palpated Neurologic: Grossly normal   Pelvic: External genitalia:  no lesions              Urethra:  normal  appearing urethra with no masses, tenderness or lesions              Bartholins and Skenes: normal                 Vagina: normal appearing vagina with normal colo, watery vaginal discharge noted, no lesions              Cervix: no lesions  Pap taken: No. Bimanual Exam:  Uterus:  normal size, contour, position, consistency, mobility, non-tender              Adnexa: normal adnexa and no mass, fullness, tenderness               Rectovaginal: Confirms               Anus:  normal sphincter tone, no lesions  Chaperone, Terence Lux, CMA, was present for exam.  A:  Vaginal discharge Possible urinary incontinence contributing On Tamoxifen  P:   D/w pt possible Tamoxifen as cause.  Will evaluated for vaginitis causes including STDs.  Pt aware of this and comfortable with plan. Nuswab sent today. If this is negative, will plan to use pyridium and wear mini pad to see if having staining on pad.  If this is the case, she likely has incontinence and will need urology evaluation. Consider vaginal vit E suppositories/cream if vaginitis testing is negative.   Consider PUS to evaluate endometrium is above is all negative/doesn't help.  Pt and I discussed plan of care and she is comfortable with proceeding in stepwise fashion.

## 2019-03-30 ENCOUNTER — Encounter: Payer: Self-pay | Admitting: Obstetrics & Gynecology

## 2019-03-30 LAB — NUSWAB VAGINITIS PLUS (VG+)
Candida albicans, NAA: NEGATIVE
Candida glabrata, NAA: NEGATIVE
Chlamydia trachomatis, NAA: NEGATIVE
Neisseria gonorrhoeae, NAA: NEGATIVE
Trich vag by NAA: NEGATIVE

## 2019-04-01 ENCOUNTER — Telehealth: Payer: Self-pay | Admitting: *Deleted

## 2019-04-01 NOTE — Telephone Encounter (Signed)
[facility] successfully faxed to [phone] at 11:35 am.  Original copy faxed [phone]) and mailed to patient.

## 2019-04-07 ENCOUNTER — Other Ambulatory Visit: Payer: Self-pay | Admitting: Oncology

## 2019-04-08 ENCOUNTER — Telehealth: Payer: Self-pay

## 2019-04-08 NOTE — Telephone Encounter (Signed)
Tried calling patient with the following message as seen below. No answer, left message for patient to call me back on Monday at [phone].

## 2019-04-08 NOTE — Telephone Encounter (Signed)
-----   Message from Megan Salon, MD sent at 04/08/2019  6:19 AM EST ----- Please let pt know her vaginitis testing was all negative.  She and I discussed her taking AZO OTC for a few days and wearing a pad to see if she saw it in the pad.  I'm not sure if she's done this yet.  If not, she should proceed.  Also, we discussed starting Vit E vaginal suppositories or cream to see if this would help her discharge.  Would she like me to go ahead and send RX for this?  It will go to [facility] so please make sure she knows where this is located (on [address]).

## 2019-04-15 ENCOUNTER — Other Ambulatory Visit: Payer: Self-pay | Admitting: Obstetrics & Gynecology

## 2019-04-15 MED ORDER — NONFORMULARY OR COMPOUNDED ITEM: 3 refills | Status: DC

## 2019-04-22 NOTE — Telephone Encounter (Signed)
See result note from 04/11/19. Results were reviewed with patient and prescription for Vitamin E suppositories were sent to pharmacy.  Tried calling patient to schedule for 12 week recheck. No answer, left message for patient to call our office back to schedule.

## 2019-04-22 NOTE — Telephone Encounter (Signed)
Patient has been scheduled for 12 week recheck. Closing encounter.

## 2019-05-04 ENCOUNTER — Ambulatory Visit: Payer: No Typology Code available for payment source | Admitting: Psychiatry

## 2019-06-16 ENCOUNTER — Inpatient Hospital Stay: Payer: PRIVATE HEALTH INSURANCE

## 2019-06-16 ENCOUNTER — Inpatient Hospital Stay: Payer: PRIVATE HEALTH INSURANCE | Admitting: Oncology

## 2019-06-22 ENCOUNTER — Other Ambulatory Visit: Payer: Self-pay

## 2019-06-22 ENCOUNTER — Inpatient Hospital Stay: Payer: PRIVATE HEALTH INSURANCE

## 2019-06-22 ENCOUNTER — Inpatient Hospital Stay (HOSPITAL_BASED_OUTPATIENT_CLINIC_OR_DEPARTMENT_OTHER): Payer: PRIVATE HEALTH INSURANCE | Admitting: Oncology

## 2019-06-22 ENCOUNTER — Inpatient Hospital Stay: Payer: PRIVATE HEALTH INSURANCE | Attending: Oncology

## 2019-06-22 VITALS — BP 121/86 | HR 72 | Temp 98.2°F | Resp 20 | Ht 67.0 in | Wt 185.4 lb

## 2019-06-22 DIAGNOSIS — C50212 Malignant neoplasm of upper-inner quadrant of left female breast: Secondary | ICD-10-CM

## 2019-06-22 DIAGNOSIS — Z170 Estrogen receptor positive status [ER+]: Secondary | ICD-10-CM

## 2019-06-22 DIAGNOSIS — D708 Other neutropenia: Secondary | ICD-10-CM | POA: Diagnosis not present

## 2019-06-22 DIAGNOSIS — Z923 Personal history of irradiation: Secondary | ICD-10-CM | POA: Diagnosis not present

## 2019-06-22 DIAGNOSIS — Z79810 Long term (current) use of selective estrogen receptor modulators (SERMs): Secondary | ICD-10-CM | POA: Insufficient documentation

## 2019-06-22 DIAGNOSIS — Z95828 Presence of other vascular implants and grafts: Secondary | ICD-10-CM

## 2019-06-22 DIAGNOSIS — Z79899 Other long term (current) drug therapy: Secondary | ICD-10-CM | POA: Diagnosis not present

## 2019-06-22 LAB — CBC WITH DIFFERENTIAL/PLATELET
Abs Immature Granulocytes: 0 10*3/uL (ref 0.00–0.07)
Basophils Absolute: 0 10*3/uL (ref 0.0–0.1)
Basophils Relative: 1 %
Eosinophils Absolute: 0.1 10*3/uL (ref 0.0–0.5)
Eosinophils Relative: 3 %
HCT: 39 % (ref 36.0–46.0)
Hemoglobin: 12.1 g/dL (ref 12.0–15.0)
Immature Granulocytes: 0 %
Lymphocytes Relative: 44 %
Lymphs Abs: 1.4 10*3/uL (ref 0.7–4.0)
MCH: 26.7 pg (ref 26.0–34.0)
MCHC: 31 g/dL (ref 30.0–36.0)
MCV: 85.9 fL (ref 80.0–100.0)
Monocytes Absolute: 0.3 10*3/uL (ref 0.1–1.0)
Monocytes Relative: 11 %
Neutro Abs: 1.3 10*3/uL — ABNORMAL LOW (ref 1.7–7.7)
Neutrophils Relative %: 41 %
Platelets: 167 10*3/uL (ref 150–400)
RBC: 4.54 MIL/uL (ref 3.87–5.11)
RDW: 13 % (ref 11.5–15.5)
WBC: 3.1 10*3/uL — ABNORMAL LOW (ref 4.0–10.5)
nRBC: 0 % (ref 0.0–0.2)

## 2019-06-22 LAB — COMPREHENSIVE METABOLIC PANEL
ALT: 17 U/L (ref 0–44)
AST: 18 U/L (ref 15–41)
Albumin: 3.8 g/dL (ref 3.5–5.0)
Alkaline Phosphatase: 74 U/L (ref 38–126)
Anion gap: 6 (ref 5–15)
BUN: 8 mg/dL (ref 6–20)
CO2: 29 mmol/L (ref 22–32)
Calcium: 9 mg/dL (ref 8.9–10.3)
Chloride: 105 mmol/L (ref 98–111)
Creatinine, Ser: 0.79 mg/dL (ref 0.44–1.00)
GFR calc Af Amer: >60 mL/min (ref >60)
GFR calc non Af Amer: >60 mL/min (ref >60)
Glucose, Bld: 88 mg/dL (ref 70–99)
Potassium: 4.1 mmol/L (ref 3.5–5.1)
Sodium: 140 mmol/L (ref 135–145)
Total Bilirubin: 0.3 mg/dL (ref 0.3–1.2)
Total Protein: 7.2 g/dL (ref 6.5–8.1)

## 2019-06-22 MED ORDER — GOSERELIN ACETATE 10.8 MG ~~LOC~~ IMPL
10.8000 mg | DRUG_IMPLANT | Freq: Once | SUBCUTANEOUS | Status: AC
  Administered 2019-06-22: 15:00:00 10.8 mg via SUBCUTANEOUS
  Filled 2019-06-22: qty 10.8

## 2019-06-22 NOTE — Progress Notes (Signed)
[facility]  [phone] [phone]    ID: SMT. LODER DOB: 1980-12-04  MR#: [id number]  WGN#:[id number]  Patient Care Team: [name], MD as PCP - General (Family Medicine) Stark Klein, MD as Consulting Physician (General Surgery) Layken Beg, Virgie Dad, MD as Consulting Physician (Oncology) Eppie Gibson, MD as Attending Physician (Radiation Oncology) Delice Bison Charlestine Massed, NP as Nurse Practitioner (Hematology and Oncology) OTHER MD:   CHIEF COMPLAINT:  Estrogen receptor positive breast cancer  CURRENT TREATMENT: goserelin, tamoxifen   INTERVAL HISTORY: Velera returns today for follow-up and treatment of her triple positive breast cancer.   She continues on tamoxifen.  She is tolerating the tamoxifen moderately well and is obtaining it at a good price.  She also continues on goserelin.  She is currently at the every 44-monthdose.    We are following her estradiol levels.: Results for CDAYANI, WINBUSH(MRN [id number] as of 06/22/2019 13:11  Ref. 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  She tells me geriatrics is her primary interest but transitional care also is in the works.  She saw Dr. MSabra Heckand was started on vitamin E suppositories which she says are helping.  However states he would like to go ahead and have the ovaries removed so she does not have to continue to receive the abdominal injections which are unpleasant.  A detailed review of systems today was otherwise stable.   BREAST CANCER HISTORY: From the original intake note:  SAdamnoted some itching on her breasts and since this was the symptom that preceded her grandmother's breast cancer [facility] The breast density was category B. In the left breast central to the nipple there was a 1.7 cm irregular high density mass with pleomorphic calcifications. Accordingly the patient was brought back 01/14/2016 for left diagnostic mammography and ultrasonography. This confirmed a 0.8 cm irregular mass in the left breast at the 10:00 position. 0.5 cm from the nipple lateral and posterior to that there was a 0.2 cm cluster of amorphous calcifications with an associated density. Anterior to the index mass was a 0.5 cm cluster of amorphous calcifications. All 3 findings taken together 3 cm. Ultrasound of the left breast found a 1 cm irregular mass in the left breast at the 10:00 position retroareolarly. There were no other sonographic abnormalities and the left axilla was sonographically benign  On 01/15/2016 the patient underwent core needle biopsy of the left breast index lesion at 10:00, and this found invasive ductal carcinoma, E-cadherin positive, estrogen receptor 90% positive, progesterone receptor 40% positive, both with moderate staining intensity, with an MIB-1 of 5%, but HER-2 amplified, with a signals ratio of 2.41 and the number per cell 6.50. One of the 2 areas of abnormal calcifications, the more posterior one measuring 0.2 cm, was also biopsied and showed only atypical ductal hyperplasia (S [id number]).  Her subsequent history is as detailed below   PAST MEDICAL HISTORY: Past Medical History:  Diagnosis Date  . Anemia    as a child  . Anxiety   . Cholestasis   . Elevated liver enzymes    during pregnancy March 2015  . Family history of breast cancer   .  Family history of prostate cancer   . GERD (gastroesophageal reflux disease)    one episode  . Headache   . History of radiation therapy 06/11/16- 07/23/16   Left Breast 50 Gy in 25 fractions, Left Breast boost 10 Gy in 5 fractions.   . Hypertension   . Hypothyroidism   .  Lymphedema of arm    LEFT  . Malignant neoplasm of upper-inner quadrant of left female breast ([place]) 01/16/2016    PAST SURGICAL HISTORY: Past Surgical History:  Procedure Laterality Date  . BREAST LUMPECTOMY WITH RADIOACTIVE SEED AND SENTINEL LYMPH NODE BIOPSY Left 03/13/2016   Procedure: LEFT BREAST LUMPECTOMY WITH RADIOACTIVE SEED X 2 AND SENTINEL LYMPH NODE BIOPSY;  Surgeon: Stark Klein, MD;  Location: [facility];  Service: General;  Laterality: Left;  . NO PAST SURGERIES    . PORT-A-CATH REMOVAL N/A 04/16/2017   Procedure: REMOVAL PORT-A-CATH;  Surgeon: Stark Klein, MD;  Location: [facility];  Service: General;  Laterality: N/A;  . PORTACATH PLACEMENT Right 03/13/2016   Procedure: INSERTION PORT-A-CATH;  Surgeon: Stark Klein, MD;  Location: [facility];  Service: General;  Laterality: Right;    FAMILY HISTORY: Family History  Problem Relation Age of Onset  . Hypertension Mother   . Hypertension Father   . Prostate cancer Father   . Liver cancer Maternal Grandmother   . Breast cancer Paternal Grandmother        dx in her 44s  . Lung cancer Paternal Aunt        smoker  . Heart attack Paternal Grandfather   . Liver cancer Other    The patient's parents are in their mid 57s as of November 2017. The patient father was diagnosed with prostate cancer at age 11. A paternal aunt was diagnosed with lung cancer in her late 82s. Also the paternal grandmother was diagnosed with breast cancer at the age of 82. On the mother's side there are 2 cases of liver cancer, age of diagnosis unknown.   GYNECOLOGIC HISTORY:  No LMP recorded.  Menarche age 37, first live birth age 49, she is [place] P2. She was still having periods at the time of breast cancer diagnosis.. She remotely used a number ring and oral contraceptives but stopped in 2016.more recently they used barrier methods for contraception.   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  She will be  completing a nurse practitioner program December 2021.  Her husband [name] works as a Printmaker for a Forensic psychologist. Their children are [name] age 1 and in 8th grade and [name] age 38 and in [place] as of September 2019. Remmy is not a church attender   ADVANCED DIRECTIVES: In the absence of any documents to the contrary the patient's husband is her healthcare power of attorney   HEALTH MAINTENANCE: Social History   Tobacco Use  . Smoking status: Never Smoker  . Smokeless tobacco: Never Used  Substance Use Topics  . Alcohol use: No  . Drug use: No     Colonoscopy:  PAP:2016  Bone density:   Allergies  Allergen Reactions  . Amoxicillin Diarrhea and Nausea Only    Has patient had a PCN reaction causing immediate rash, facial/tongue/throat swelling, SOB or lightheadedness with hypotension: No Has patient had a PCN reaction causing severe rash involving mucus membranes or skin necrosis: No Has patient had a PCN reaction that required hospitalization No Has patient had a PCN reaction occurring within the  last 10 years: Yes If all of the above answers are "NO", then may proceed with Cephalosporin use.     Current Outpatient Medications  Medication Sig Dispense Refill  . acetaminophen (TYLENOL) 500 MG tablet Take 500 mg by mouth every 6 (six) hours as needed for headache.     Marland Kitchen amLODipine (NORVASC) 5 MG tablet Take 5 mg by mouth daily.    Marland Kitchen b complex vitamins tablet Take 1 tablet by mouth daily.    Marland Kitchen gabapentin (NEURONTIN) 300 MG capsule TAKE 1 CAPSULE BY MOUTH DAILY AT BEDTIME 90 capsule 3  . goserelin (ZOLADEX) 10.8 MG injection Inject 10.8 mg into the skin every 3 (three) months. She is not sure of the exact dose     . ibuprofen (ADVIL,MOTRIN) 200 MG tablet Take 400 mg by mouth every 8 (eight) hours as needed for mild pain.     Marland Kitchen lidocaine-prilocaine (EMLA) cream Apply 1 application topically as needed. 30 g 2  . Multiple Vitamins-Minerals (MULTIVITAMIN GUMMIES ADULT  PO) Take 2 tablets by mouth daily.    . NONFORMULARY OR COMPOUNDED ITEM Vitamin E vaginal suppositories 200u/ml.  One pv three times weekly. 36 each 3  . Omega-3 Fatty Acids (FISH OIL) 1000 MG CAPS Take by mouth daily.    . tamoxifen (NOLVADEX) 20 MG tablet TAKE 1 TABLET DAILY BY MOUTH. 90 tablet 3  . venlafaxine XR (EFFEXOR-XR) 150 MG 24 hr capsule TAKE 1 CAPSULE (150 MG TOTAL) BY MOUTH DAILY WITH BREAKFAST. 90 capsule 5   No current facility-administered medications for this visit.    OBJECTIVE: African-American woman who appears younger than stated age  79:   06/22/19 1326  BP: 121/86  Pulse: 72  Resp: 20  Temp: 98.2 F (36.8 C)  SpO2: 98%     Body mass index is 29.04 kg/m.    ECOG FS:1 - Symptomatic but completely ambulatory   Sclerae unicteric, EOMs intact Wearing a mask No cervical or supraclavicular adenopathy Lungs no rales or rhonchi Heart regular rate and rhythm Abd soft, nontender, positive bowel sounds MSK no focal spinal tenderness, no upper extremity lymphedema Neuro: nonfocal, well oriented, appropriate affect Breasts: The right breast is benign.  The left breast has undergone lumpectomy followed by radiation.  There is no evidence of local recurrence.  Both axillae are benign.   LAB RESULTS:  CMP     Component Value Date/Time   NA 140 06/22/2019 1314   NA 142 02/27/2017 1350   K 4.1 06/22/2019 1314   K 3.7 02/27/2017 1350   CL 105 06/22/2019 1314   CO2 29 06/22/2019 1314   CO2 28 02/27/2017 1350   GLUCOSE 88 06/22/2019 1314   GLUCOSE 124 02/27/2017 1350   BUN 8 06/22/2019 1314   BUN 8.9 02/27/2017 1350   CREATININE 0.79 06/22/2019 1314   CREATININE 0.79 07/31/2017 1349   CREATININE 0.8 02/27/2017 1350   CALCIUM 9.0 06/22/2019 1314   CALCIUM 9.5 02/27/2017 1350   PROT 7.2 06/22/2019 1314   PROT 7.3 02/27/2017 1350   ALBUMIN 3.8 06/22/2019 1314   ALBUMIN 3.8 02/27/2017 1350   AST 18 06/22/2019 1314   AST 24 07/31/2017 1349   AST 21  02/27/2017 1350   ALT 17 06/22/2019 1314   ALT 19 07/31/2017 1349   ALT 25 02/27/2017 1350   ALKPHOS 74 06/22/2019 1314   ALKPHOS 82 02/27/2017 1350   BILITOT 0.3 06/22/2019 1314   BILITOT <0.2 (L) 07/31/2017 1349   BILITOT 0.22 02/27/2017 1350  GFRNONAA >60 06/22/2019 1314   GFRNONAA >60 07/31/2017 1349   GFRAA >60 06/22/2019 1314   GFRAA >60 07/31/2017 1349    INo results found for: SPEP, UPEP  Lab Results  Component Value Date   WBC 3.1 (L) 06/22/2019   NEUTROABS 1.3 (L) 06/22/2019   HGB 12.1 06/22/2019   HCT 39.0 06/22/2019   MCV 85.9 06/22/2019   PLT 167 06/22/2019      Chemistry      Component Value Date/Time   NA 140 06/22/2019 1314   NA 142 02/27/2017 1350   K 4.1 06/22/2019 1314   K 3.7 02/27/2017 1350   CL 105 06/22/2019 1314   CO2 29 06/22/2019 1314   CO2 28 02/27/2017 1350   BUN 8 06/22/2019 1314   BUN 8.9 02/27/2017 1350   CREATININE 0.79 06/22/2019 1314   CREATININE 0.79 07/31/2017 1349   CREATININE 0.8 02/27/2017 1350      Component Value Date/Time   CALCIUM 9.0 06/22/2019 1314   CALCIUM 9.5 02/27/2017 1350   ALKPHOS 74 06/22/2019 1314   ALKPHOS 82 02/27/2017 1350   AST 18 06/22/2019 1314   AST 24 07/31/2017 1349   AST 21 02/27/2017 1350   ALT 17 06/22/2019 1314   ALT 19 07/31/2017 1349   ALT 25 02/27/2017 1350   BILITOT 0.3 06/22/2019 1314   BILITOT <0.2 (L) 07/31/2017 1349   BILITOT 0.22 02/27/2017 1350       No results found for: LABCA2  No components found for: LABCA125  No results for input(s): INR in the last 168 hours.  Urinalysis    Component Value Date/Time   COLORURINE YELLOW 10/14/2012 1954   APPEARANCEUR CLEAR 10/14/2012 1954   LABSPEC >1.030 (H) 10/14/2012 1954   PHURINE 6.0 10/14/2012 1954   GLUCOSEU NEGATIVE 10/14/2012 1954   HGBUR TRACE (A) 10/14/2012 1954   BILIRUBINUR NEGATIVE 10/14/2012 1954   KETONESUR 15 (A) 10/14/2012 1954   PROTEINUR NEGATIVE 10/14/2012 1954   UROBILINOGEN 0.2 10/14/2012 1954    NITRITE NEGATIVE 10/14/2012 1954   LEUKOCYTESUR NEGATIVE 10/14/2012 1954    STUDIES: No results found.   ELIGIBLE FOR AVAILABLE RESEARCH PROTOCOL: no   ASSESSMENT: 39 y.o. [place] woman status post left breast upper inner quadrant biopsy 01/15/2016 for a clinical T1c N0, stage IA invasive ductal carcinoma, E-cadherin positive, estrogen receptor 90% positive, progesterone receptor 40% positive, both with moderate staining intensity, with an MIB-1 of 5%, but HER-2 amplified, with a signals ratio of 2.41 and the number per cell 6.50.  (a) One of 2 areas of abnormal calcifications, the more posterior one measuring 0.2 cm, was also biopsied and showed only atypical ductal hyperplasia  (b) a 4.1 cm area of non-masslike enhancement in the left breast underwent biopsy 02/19/2016 showing only fibrocystic changes  (1) tamoxifen started 01/23/2016 neoadjuvantly  (2) genetics testing 02/22/2016 through the Breast/Ovarian gene panel offered by [facility] found no deleterious mutations in  ATM, BARD1, BRCA1, BRCA2, BRIP1, CDH1, CHEK2, EPCAM, FANCC, MLH1, MSH2, MSH6, NBN, PALB2, PMS2, PTEN, RAD51C, RAD51D, TP53, and XRCC2.     (3) status post left lumpectomy and sentinel lymph node sampling 03/13/2016 for an mpT1c pN0, stage IA invasive ductal carcinoma, grade 2, with negative margins.  (4) paclitaxel weekly 12 and trastuzumab every 21 days beginning on 04/10/2016.  (a) paclitaxel discontinued after 5 doses because of atypical neuropathy symptoms. Last dose 04/28/2016  (5) trastuzumab continued to complete a year--last dose 04/10/2017  (a) echocardiogram 02/11/2016 shows ejection fraction in the 60-65% range.  (  b) echo 05/28/2916 shows EF in the 65-70% range  (c) echo on 09/26/2016 shows LVEF of 55-60%  (d) echo on 01/14/2017 shows EF in the 60-65%  (6) adjuvant radiation 06/11/16 - 07/23/16 Site/dose:    1) Left Breast / 50 Gy in 25 fractions 2) Left Breast Boost / 10 Gy in 5 fractions  (7)  tamoxifen resumed to 07/25/2016  (8) patient resumed menses post chemo, April 2018  (a) goserelin started 06/17/2016, initially given every 28 days, changed to every 12 weeks on 11/12/16 with estradiol monitoring  (b) referred 06/22/2019 for BSO   PLAN: [name] is now a little over 3 years out from definitive surgery for her breast cancer with no evidence of disease recurrence.  This is very favorable.  She is tolerating tamoxifen well and the plan is to continue that a minimum of 5 years.  She is also receiving goserelin for ovarian suppression.  She is considering bilateral salpingo-oophorectomy for convenience.  She understands that that would not be reversible.  I have placed the referral in for her.  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  She will be able to take her exams no later than January apparently and should be able to start a new job shortly thereafter  She knows to call for any other issue that may develop before the next visit.  Total encounter time 25 minutes.Sarajane Jews C. Shyvonne Chastang, MD  06/22/19 4:48 PM Medical Oncology and Hematology [facility] [place], [place] [address] Tel. [phone]    Fax. [phone]   I, Wilburn Mylar, am acting as scribe for Dr. Virgie Dad. Justiss Gerbino.  I, Lurline Del MD, have reviewed the above documentation for accuracy and completeness, and I agree with the above.   *Total Encounter Time as defined by the Centers for Medicare and Medicaid Services includes, in addition to the face-to-face time of a patient visit (documented in the note above) non-face-to-face time: obtaining and reviewing outside history, ordering and reviewing medications, tests or procedures, care coordination (communications with other health care professionals or caregivers) and documentation in the medical record.

## 2019-06-22 NOTE — Patient Instructions (Signed)

## 2019-06-23 ENCOUNTER — Telehealth: Payer: Self-pay | Admitting: *Deleted

## 2019-06-23 NOTE — Telephone Encounter (Signed)
Called and left the patient a message to call the office back. Patient to be scheduled for a new patient appt

## 2019-06-23 NOTE — Telephone Encounter (Signed)
Patient called back and was scheduled for a new appt.

## 2019-06-24 ENCOUNTER — Telehealth: Payer: Self-pay | Admitting: Oncology

## 2019-06-24 NOTE — Telephone Encounter (Signed)
Scheduled appts per 4/28 los. Pt confirmed appt date and time.

## 2019-06-25 NOTE — Progress Notes (Signed)
GYNECOLOGIC ONCOLOGY NEW PATIENT CONSULTATION   Patient Name: Virginia [name]  Patient Age: 39 y.o. 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  I had a long discussion with Ms. Steen regarding the rationale for therapeutic removal of the ovaries. She has an estrogen receptor positive breast cancer and her medical oncologist is considering ablation of ovarian function.  The two largest trials (SOFT and TEXT) found a prolonged disease-free survival benefit in "higher risk" patients when ovulatory suppression was added to standard endocrine therapy (e.g. tamoxifen, AI).  A survival benefit has not emerged.    I discussed that ovarian ablation can be permanent (oophorectomy) or reversible (Lupron).  While there is limited high-quality evidence, some have proposed that oophorectomy be gold standard ablative therapy.  Patient is aware that oophorectomy (or long-term Lupron use) could increase her risk for osteoporosis, cardiovascular disease, hot flashes, and depression/anxiety.   We discussed that oophorectomy would make her menopausal and a candidate for aromatase inhibitor therapy (rather than SERM).  She is aware that AI therapy has been shown to have better recurrence risk reduction than SERM (ATAC trial).   We discussed that if the plan is to keep her on tamoxifen, it would be reasonable to consider concurrent hysterectomy given the increased risk of endometrial overgrowth, hyperplasia and malignancy with tamoxifen use.  The patient's preference is to wait until November to schedule her surgery.  She is nervous about undergoing the procedure this month and returning to clinicals with activity restrictions.   I have asked her to reach out in June or July to get an appointment with me scheduled in October so we can get her on the surgical date for November.  A copy of this note was sent to the patient's referring provider.   35 minutes of total time was spent for this patient encounter, including preparation, face-to-face counseling with the patient and coordination of care, and documentation of the encounter.   Jeral Pinch, MD  Division of Gynecologic Oncology  Department of Obstetrics and Gynecology  [facility]  ___________________________________________  Chief Complaint: Chief Complaint  Patient presents with  . Malignant neoplasm of upper-inner quadrant of left breast in    New Patient    History of Present Illness:  Virginia [name] is a 39 y.o. y.o. female who is seen in consultation at the request or Dr. Jana Hakim for an evaluation of therapeutic BSO in the setting of estrogen scepter positive breast cancer.  She is currently on tamoxifen and goserelin q 3 months for ovarian suppression.  Overall, she notes doing well.  She has some hot flashes and vaginal discharge related to hormone suppression.  She started vitamin E suppositories which is helping with her vaginal symptoms.  She notes overall that her appetite is up and down.  She has some nausea with Effexor.  She notes normal bowel function.  Patient is currently in an NP program.  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  Her gynecologic history is notable for an abnormal Pap smear in January (her first abnormal).  Her Pap test was ASCUS, HPV negative at that time.  Treatment History: Oncology History  Malignant neoplasm of upper-inner quadrant of left breast in female,  estrogen receptor positive ([place])  01/15/2016 Initial Biopsy   Left breast upper inner quadrant biopsy: IDC, ER/PR positive, Ki-67 5%, HER-2 positive (ratio 2.41).  One of 2 areas of abnormal calcifications,  more posterior one, was biopsied and showed atypical ductal hyperplasia, and a 4.1cm area of non mass like enhancement in the left breat underwent biopsy showing fibrocystic changes.    01/16/2016 Initial Diagnosis   Malignant neoplasm of upper-inner quadrant of left female breast ([place])   12/2015 -  Anti-estrogen oral therapy   Tamoxifen daily, held during chemotherapy and radiation, and resumed on 07/2016   02/22/2016 Genetic Testing   Patient has genetic testing done for breast cancer. Negative genetic testing on a custom breast cancer panel.  Negative genetic testing for the MSH2 inversion analysis ([place] inversion). The Custom gene panel offered by [facility] includes sequencing and rearrangement analysis for the following 21 genes:  ATM, BARD1, BRCA1, BRCA2, BRIP1, CDH1, CHEK2, EPCAM, FANCC, HOXB13, MLH1, MSH2, MSH6, NBN, PALB2, PMS2, PTEN, RAD51C, RAD51D, TP53, and XRCC2.   The report date is February 22, 2016.   03/13/2016 Surgery   Left lumpectomy and SLNB: IDC, grade 2, multiple foci, 1.3 cm, 0.8 cm, and 0.5 cm, margins negative, 2 SLN negative   04/10/2016 - 04/10/2017 Chemotherapy   Paclitaxel weekly and Trastuzumab every 3 weeks.  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  Completed one year of maintenance Trastuzumab    06/11/2016 - 07/23/2016 Radiation Therapy   1) Left Breast / 50 Gy in 25 fractions.  2) Left Breast Boost / 10 Gy in 5 fractions.     05/2016 Miscellaneous   Due to resumed menses, Goserelin started, initially given every 4 weeks, however changed to every 12 weeks with estradiol monitoring.       PAST MEDICAL HISTORY:  Past Medical History:  Diagnosis Date  . Anemia    as a child  . Anxiety   . Cholestasis   . Elevated liver enzymes    during pregnancy March 2015  . Family history of breast cancer   . Family history of prostate cancer   . GERD (gastroesophageal reflux disease)    one episode  . Headache   . History of radiation therapy  06/11/16- 07/23/16   Left Breast 50 Gy in 25 fractions, Left Breast boost 10 Gy in 5 fractions.   . Hypertension   . Hypothyroidism   . Lymphedema of arm    LEFT  . Malignant neoplasm of upper-inner quadrant of left female breast ([place]) 01/16/2016     PAST SURGICAL HISTORY:  Past Surgical History:  Procedure Laterality Date  . BREAST LUMPECTOMY WITH RADIOACTIVE SEED AND SENTINEL LYMPH NODE BIOPSY Left 03/13/2016   Procedure: LEFT BREAST LUMPECTOMY WITH RADIOACTIVE SEED X 2 AND SENTINEL LYMPH NODE BIOPSY;  Surgeon: Stark Klein, MD;  Location: [facility];  Service: General;  Laterality: Left;  . NO PAST SURGERIES    . PORT-A-CATH REMOVAL N/A 04/16/2017   Procedure: REMOVAL PORT-A-CATH;  Surgeon: Stark Klein, MD;  Location: [facility];  Service: General;  Laterality: N/A;  . PORTACATH PLACEMENT Right 03/13/2016   Procedure: INSERTION PORT-A-CATH;  Surgeon: Stark Klein, MD;  Location: [facility];  Service: General;  Laterality: Right;    OB/GYN HISTORY:  OB History  Gravida Para Term Preterm AB Living  _0 SAB TAB Ectopic Multiple Live Births          2    # Outcome  Date GA Lbr Len/2nd Weight Sex Delivery Anes PTL Lv  2 Term 05/20/13 6w0d05:30 / 01:21 6 lb 14.6 oz (3.135 kg) F Vag-Spont EPI  LIV  1 Term 2005 380w0d7 lb 2 oz (3.232 kg) M Vag-Spont   LIV    No LMP recorded.  Age at menarche: [age]ge at menopause: [age] of HRT: Denies Hx of STDs: Denies Last pap: 2021 History of abnormal pap smears: See HPI  SCREENING STUDIES:  Last colonoscopy: n/a  MEDICATIONS: Outpatient Encounter Medications as of 06/27/2019  Medication Sig  . acetaminophen (TYLENOL) 500 MG tablet Take 500 mg by mouth every 6 (six) hours as needed for headache.   . Marland KitchenmLODipine (NORVASC) 5 MG tablet Take 5 mg by mouth daily.  . Marland Kitchen complex vitamins tablet Take 1 tablet by mouth daily.  . Marland Kitchenabapentin (NEURONTIN) 300 MG capsule TAKE 1 CAPSULE BY MOUTH DAILY AT BEDTIME  . goserelin (ZOLADEX) 10.8 MG injection Inject  10.8 mg into the skin every 3 (three) months. She is not sure of the exact dose   . ibuprofen (ADVIL,MOTRIN) 200 MG tablet Take 400 mg by mouth every 8 (eight) hours as needed for mild pain.   . Marland Kitchenidocaine-prilocaine (EMLA) cream Apply 1 application topically as needed.  . Multiple Vitamins-Minerals (MULTIVITAMIN GUMMIES ADULT PO) Take 2 tablets by mouth daily.  . NONFORMULARY OR COMPOUNDED ITEM Vitamin E vaginal suppositories 200u/ml.  One pv three times weekly.  . Omega-3 Fatty Acids (FISH OIL) 1000 MG CAPS Take by mouth daily.  . tamoxifen (NOLVADEX) 20 MG tablet TAKE 1 TABLET DAILY BY MOUTH.  . venlafaxine XR (EFFEXOR-XR) 150 MG 24 hr capsule TAKE 1 CAPSULE (150 MG TOTAL) BY MOUTH DAILY WITH BREAKFAST.  . [DISCONTINUED] prochlorperazine (COMPAZINE) 10 MG tablet Take 1 tablet (10 mg total) by mouth every 6 (six) hours as needed (Nausea or vomiting). (Patient not taking: Reported on 06/03/2016)   No facility-administered encounter medications on file as of 06/27/2019.    ALLERGIES:  Allergies  Allergen Reactions  . Amoxicillin Diarrhea and Nausea Only    Has patient had a PCN reaction causing immediate rash, facial/tongue/throat swelling, SOB or lightheadedness with hypotension: No Has patient had a PCN reaction causing severe rash involving mucus membranes or skin necrosis: No Has patient had a PCN reaction that required hospitalization No Has patient had a PCN reaction occurring within the last 10 years: Yes If all of the above answers are "NO", then may proceed with Cephalosporin use.      FAMILY HISTORY:  Family History  Problem Relation Age of Onset  . Hypertension Mother   . Hypertension Father   . Prostate cancer Father   . Liver cancer Maternal Grandmother   . Breast cancer Paternal Grandmother        dx in her 4014s. Lung cancer Paternal Aunt        smoker  . Heart attack Paternal Grandfather   . Liver cancer Other      SOCIAL HISTORY:    Social Connections:   .  Frequency of Communication with Friends and Family:   . Frequency of Social Gatherings with Friends and Family:   . Attends Religious Services:   . Active Member of Clubs or Organizations:   . Attends ClArchivisteetings:   . Marland Kitchenarital Status:     REVIEW OF SYSTEMS:  See HPI for pertinent positives. Denies fevers, chills, fatigue, unexplained weight changes. Denies hearing loss, neck lumps or masses, mouth  sores, ringing in ears or voice changes. Denies cough or wheezing.  Denies shortness of breath. Denies chest pain or palpitations. Denies leg swelling. Denies abdominal distention, pain, blood in stools, constipation, diarrhea, nausea, vomiting, or early satiety. Denies pain with intercourse, dysuria, frequency, hematuria or incontinence. Denies pelvic pain, vaginal bleeding.   Denies joint pain, back pain or muscle pain/cramps. Denies itching, rash, or wounds. Denies dizziness, headaches, numbness or seizures. Denies swollen lymph nodes or glands, denies easy bruising or bleeding. Denies anxiety, depression, confusion, or decreased concentration.  Physical Exam:  Vital Signs for this encounter:  Blood pressure 109/66, pulse 86, temperature 98.3 F (36.8 C), temperature source Temporal, resp. rate 18, height _0  (1.702 m), weight 187 lb (84.8 kg), SpO2 97 %. Body mass index is 29.29 kg/m. General: Alert, oriented, no acute distress.  HEENT: Normocephalic, atraumatic. Sclera anicteric.  Chest: Clear to auscultation bilaterally. No wheezes, rhonchi, or rales. Cardiovascular: Regular rate and rhythm, no murmurs, rubs, or gallops.  Lymphatics: No cervical, supraclavicular adenopathy.  GU: Deferred.  LABORATORY AND RADIOLOGIC DATA:  Outside medical records were reviewed to synthesize the above history, along with the history and physical obtained during the visit.   Lab Results  Component Value Date   WBC 3.1 (L) 06/22/2019   HGB 12.1 06/22/2019   HCT 39.0 06/22/2019    PLT 167 06/22/2019   GLUCOSE 88 06/22/2019   ALT 17 06/22/2019   AST 18 06/22/2019   NA 140 06/22/2019   K 4.1 06/22/2019   CL 105 06/22/2019   CREATININE 0.79 06/22/2019   BUN 8 06/22/2019   CO2 29 06/22/2019   TSH 1.597 04/28/2013   03/03/19: ASCUS pap, HPV negative

## 2019-06-27 ENCOUNTER — Encounter: Payer: Self-pay | Admitting: Gynecologic Oncology

## 2019-06-27 ENCOUNTER — Inpatient Hospital Stay: Payer: PRIVATE HEALTH INSURANCE | Attending: Oncology | Admitting: Gynecologic Oncology

## 2019-06-27 ENCOUNTER — Other Ambulatory Visit: Payer: Self-pay

## 2019-06-27 VITALS — BP 109/66 | HR 86 | Temp 98.3°F | Resp 18 | Ht 67.0 in | Wt 187.0 lb

## 2019-06-27 DIAGNOSIS — C50212 Malignant neoplasm of upper-inner quadrant of left female breast: Secondary | ICD-10-CM | POA: Diagnosis present

## 2019-06-27 DIAGNOSIS — Z4002 Encounter for prophylactic removal of ovary: Secondary | ICD-10-CM

## 2019-06-27 DIAGNOSIS — Z170 Estrogen receptor positive status [ER+]: Secondary | ICD-10-CM

## 2019-06-27 LAB — ESTRADIOL, ULTRA SENS: Estradiol, Sensitive: 13.4 pg/mL

## 2019-06-27 NOTE — Patient Instructions (Signed)
Your recent Pap test showed ASCUS (atypical squamous cells of undetermined significance) and was HPV negative.  The recommendation is for repeat Pap test and HPV testing in 3 years.  Please call my clinic later this month or in June to schedule an appointment with me at the end of October.  We will plan to move forward at that time with getting your therapeutic bilateral salpingo-oophorectomy scheduled in November when convenient from a work standpoint for you.  If you have any questions or concerns prior to that, please do not hesitate to call me at that office at [phone].

## 2019-06-28 ENCOUNTER — Telehealth: Payer: Self-pay | Admitting: *Deleted

## 2019-06-28 NOTE — Telephone Encounter (Signed)
Per Annabelle Harman, called to make pt aware that she remains in menopause. Left voice message on pt personal cell as requested. Advised if there were any question to call office.

## 2019-06-29 ENCOUNTER — Encounter: Payer: Self-pay | Admitting: *Deleted

## 2019-06-29 ENCOUNTER — Encounter: Payer: Self-pay | Admitting: Oncology

## 2019-07-15 ENCOUNTER — Ambulatory Visit: Payer: Self-pay | Admitting: Obstetrics & Gynecology

## 2019-07-19 NOTE — Progress Notes (Signed)
GYNECOLOGY  VISIT  CC:   Follow up vaginal dryness  HPI: 39 y.o. G2P2002 Married [place] American female here for 12 recheck of vaginal e suppository use.  When she started using the suppositories, she did use them two to three times a week.  Discharge improved.  She has now noticed she can use more like one a week with success.  Discharge is definitely better.  She is not currently SA so cannot tell is this has helped.  Denies vaginal bleeding.  Did have genetic testing that was negative.  Saw Dr. Berline Lopes for consideration of prophylactic BSO.  Plan is to proceed in November.  She will then change from Tamoxifen to an aromatase inhibitor.    GYNECOLOGIC HISTORY: No LMP recorded. (Menstrual status: Other). Contraception: none Menopausal hormone therapy: none  Patient Active Problem List   Diagnosis Date Noted  . Chronic benign neutropenia ([place]) 06/22/2019  . Port-A-Cath in place 02/27/2017  . Headache disorder 09/26/2016  . Genetic testing 02/26/2016  . Family history of breast cancer   . Family history of prostate cancer   . Malignant neoplasm of upper-inner quadrant of left breast in female, estrogen receptor positive ([place]) 01/16/2016    Past Medical History:  Diagnosis Date  . Anemia    as a child  . Anxiety   . Cholestasis   . Elevated liver enzymes    during pregnancy March 2015  . Family history of breast cancer   . Family history of prostate cancer   . GERD (gastroesophageal reflux disease)    one episode  . Headache   . History of radiation therapy 06/11/16- 07/23/16   Left Breast 50 Gy in 25 fractions, Left Breast boost 10 Gy in 5 fractions.   . Hypertension   . Hypothyroidism   . Lymphedema of arm    LEFT  . Malignant neoplasm of upper-inner quadrant of left female breast ([place]) 01/16/2016    Past Surgical History:  Procedure Laterality Date  . BREAST LUMPECTOMY WITH RADIOACTIVE SEED AND SENTINEL LYMPH NODE BIOPSY Left 03/13/2016   Procedure: LEFT  BREAST LUMPECTOMY WITH RADIOACTIVE SEED X 2 AND SENTINEL LYMPH NODE BIOPSY;  Surgeon: Stark Klein, MD;  Location: [facility];  Service: General;  Laterality: Left;  . NO PAST SURGERIES    . PORT-A-CATH REMOVAL N/A 04/16/2017   Procedure: REMOVAL PORT-A-CATH;  Surgeon: Stark Klein, MD;  Location: [facility];  Service: General;  Laterality: N/A;  . PORTACATH PLACEMENT Right 03/13/2016   Procedure: INSERTION PORT-A-CATH;  Surgeon: Stark Klein, MD;  Location: [facility];  Service: General;  Laterality: Right;    MEDS:   Current Outpatient Medications on File Prior to Visit  Medication Sig Dispense Refill  . acetaminophen (TYLENOL) 500 MG tablet Take 500 mg by mouth every 6 (six) hours as needed for headache.     Marland Kitchen amLODipine (NORVASC) 5 MG tablet Take 5 mg by mouth daily.    Marland Kitchen b complex vitamins tablet Take 1 tablet by mouth daily.    Marland Kitchen gabapentin (NEURONTIN) 300 MG capsule TAKE 1 CAPSULE BY MOUTH DAILY AT BEDTIME 90 capsule 3  . goserelin (ZOLADEX) 10.8 MG injection Inject 10.8 mg into the skin every 3 (three) months. She is not sure of the exact dose     . ibuprofen (ADVIL,MOTRIN) 200 MG tablet Take 400 mg by mouth every 8 (eight) hours as needed for mild pain.     Marland Kitchen lidocaine-prilocaine (EMLA) cream Apply 1 application topically as needed. 30 g  2  . Multiple Vitamins-Minerals (MULTIVITAMIN GUMMIES ADULT PO) Take 2 tablets by mouth daily.    . NONFORMULARY OR COMPOUNDED ITEM Vitamin E vaginal suppositories 200u/ml.  One pv three times weekly. 36 each 3  . Omega-3 Fatty Acids (FISH OIL) 1000 MG CAPS Take by mouth daily.    . tamoxifen (NOLVADEX) 20 MG tablet TAKE 1 TABLET DAILY BY MOUTH. 90 tablet 3  . venlafaxine XR (EFFEXOR-XR) 150 MG 24 hr capsule TAKE 1 CAPSULE (150 MG TOTAL) BY MOUTH DAILY WITH BREAKFAST. 90 capsule 5  . [DISCONTINUED] prochlorperazine (COMPAZINE) 10 MG tablet Take 1 tablet (10 mg total) by mouth every 6 (six) hours as needed (Nausea or vomiting). (Patient not taking: Reported on  06/03/2016) 30 tablet 1   No current facility-administered medications on file prior to visit.    ALLERGIES: Amoxicillin  Family History  Problem Relation Age of Onset  . Hypertension Mother   . Hypertension Father   . Prostate cancer Father   . Liver cancer Maternal Grandmother   . Breast cancer Paternal Grandmother        dx in her 29s  . Lung cancer Paternal Aunt        smoker  . Heart attack Paternal Grandfather   . Liver cancer Other     SH:  Married, nonsmoker  Review of Systems  Constitutional: Negative.   HENT: Negative.   Eyes: Negative.   Respiratory: Negative.   Cardiovascular: Negative.   Gastrointestinal: Negative.   Endocrine: Negative.   Genitourinary: Negative.   Musculoskeletal: Negative.   Skin: Negative.   Allergic/Immunologic: Negative.   Neurological: Negative.   Hematological: Negative.   Psychiatric/Behavioral: Negative.     PHYSICAL EXAMINATION:    BP 118/76   Pulse 68   Temp (!) 96.9 F (36.1 C) (Skin)   Resp 16   Wt 188 lb (85.3 kg)   BMI 29.44 kg/m     General appearance: alert, cooperative and appears stated age Lymph:  no inguinal LAD noted  Pelvic: External genitalia:  no lesions              Urethra:  normal appearing urethra with no masses, tenderness or lesions              Bartholins and Skenes: normal                 Vagina: improved erythema, decreased vaginal discharge, no lesions              Cervix: no lesions              Bimanual Exam:  Uterus:  normal size, contour, position, consistency, mobility, non-tender              Adnexa: no mass, fullness, tenderness  Chaperone, Karmen Bongo RN, was present for exam.  Assessment: Vaginal atrophy with vaginal discharge that is improved with Vit E vaginal suppositories Not currently SA H/o triple positive breast cancer, stage IA diagnosed 01/15/2016, on Tamoxifen  Plan: Encouraged regular use especially if she is going to be SA (which currently she is not).  Rx has  already been done for the year.  Advised to call back with any concerns. Encouraged pt to be seen with any vaginal bleeding while on Tamoxifen Prophylactic BSO planned for 12/2019  20 minutes total spent with pt

## 2019-07-26 ENCOUNTER — Encounter: Payer: Self-pay | Admitting: Obstetrics & Gynecology

## 2019-07-26 ENCOUNTER — Ambulatory Visit (INDEPENDENT_AMBULATORY_CARE_PROVIDER_SITE_OTHER): Payer: PRIVATE HEALTH INSURANCE | Admitting: Obstetrics & Gynecology

## 2019-07-26 ENCOUNTER — Other Ambulatory Visit: Payer: Self-pay

## 2019-07-26 VITALS — BP 118/76 | HR 68 | Temp 96.9°F | Resp 16 | Wt 188.0 lb

## 2019-07-26 DIAGNOSIS — N952 Postmenopausal atrophic vaginitis: Secondary | ICD-10-CM

## 2019-07-26 DIAGNOSIS — N898 Other specified noninflammatory disorders of vagina: Secondary | ICD-10-CM

## 2019-07-28 ENCOUNTER — Other Ambulatory Visit: Payer: Self-pay | Admitting: Gynecologic Oncology

## 2019-07-28 DIAGNOSIS — Z170 Estrogen receptor positive status [ER+]: Secondary | ICD-10-CM

## 2019-07-28 DIAGNOSIS — C50212 Malignant neoplasm of upper-inner quadrant of left female breast: Secondary | ICD-10-CM

## 2019-08-25 ENCOUNTER — Other Ambulatory Visit: Payer: Self-pay | Admitting: Oncology

## 2019-09-20 ENCOUNTER — Inpatient Hospital Stay: Payer: PRIVATE HEALTH INSURANCE

## 2019-09-21 ENCOUNTER — Ambulatory Visit: Payer: PRIVATE HEALTH INSURANCE

## 2019-09-23 ENCOUNTER — Inpatient Hospital Stay: Payer: PRIVATE HEALTH INSURANCE

## 2019-09-26 ENCOUNTER — Other Ambulatory Visit: Payer: Self-pay

## 2019-09-26 ENCOUNTER — Inpatient Hospital Stay: Payer: PRIVATE HEALTH INSURANCE | Attending: Oncology

## 2019-09-26 VITALS — BP 132/92 | HR 90 | Resp 17

## 2019-09-26 DIAGNOSIS — Z170 Estrogen receptor positive status [ER+]: Secondary | ICD-10-CM | POA: Insufficient documentation

## 2019-09-26 DIAGNOSIS — Z79899 Other long term (current) drug therapy: Secondary | ICD-10-CM | POA: Diagnosis not present

## 2019-09-26 DIAGNOSIS — C50212 Malignant neoplasm of upper-inner quadrant of left female breast: Secondary | ICD-10-CM | POA: Diagnosis present

## 2019-09-26 DIAGNOSIS — Z95828 Presence of other vascular implants and grafts: Secondary | ICD-10-CM

## 2019-09-26 MED ORDER — GOSERELIN ACETATE 10.8 MG ~~LOC~~ IMPL
10.8000 mg | DRUG_IMPLANT | Freq: Once | SUBCUTANEOUS | Status: AC
  Administered 2019-09-26: 10.8 mg via SUBCUTANEOUS
  Filled 2019-09-26: qty 10.8

## 2019-09-26 MED ORDER — GOSERELIN ACETATE 3.6 MG ~~LOC~~ IMPL
DRUG_IMPLANT | SUBCUTANEOUS | Status: AC
  Filled 2019-09-26: qty 3.6

## 2019-09-26 NOTE — Patient Instructions (Signed)

## 2019-11-20 ENCOUNTER — Encounter: Payer: Self-pay | Admitting: Oncology

## 2019-12-23 ENCOUNTER — Ambulatory Visit: Payer: PRIVATE HEALTH INSURANCE | Admitting: Gynecologic Oncology

## 2019-12-26 ENCOUNTER — Ambulatory Visit: Payer: PRIVATE HEALTH INSURANCE

## 2019-12-26 ENCOUNTER — Ambulatory Visit: Payer: PRIVATE HEALTH INSURANCE | Admitting: Oncology

## 2019-12-26 ENCOUNTER — Other Ambulatory Visit: Payer: PRIVATE HEALTH INSURANCE

## 2019-12-27 ENCOUNTER — Inpatient Hospital Stay: Payer: PRIVATE HEALTH INSURANCE

## 2019-12-27 ENCOUNTER — Telehealth: Payer: Self-pay | Admitting: Oncology

## 2019-12-27 ENCOUNTER — Inpatient Hospital Stay: Payer: PRIVATE HEALTH INSURANCE | Admitting: Oncology

## 2019-12-27 NOTE — Telephone Encounter (Signed)
Called pt per 11/2 sch msg to reschedule. - unable to reach pt . Left message for patient to call back to reschedule appt.

## 2019-12-28 ENCOUNTER — Encounter (HOSPITAL_COMMUNITY): Payer: PRIVATE HEALTH INSURANCE

## 2020-01-02 ENCOUNTER — Telehealth: Payer: Self-pay | Admitting: *Deleted

## 2020-01-02 NOTE — Telephone Encounter (Signed)
Patient called back and was given the appt

## 2020-01-02 NOTE — Telephone Encounter (Signed)
Called and left the patient a message to call the office back. Patient scheduled to see Melissa APP on 11/23 at 10:30 am

## 2020-01-16 ENCOUNTER — Other Ambulatory Visit: Payer: Self-pay

## 2020-01-16 ENCOUNTER — Encounter (HOSPITAL_COMMUNITY): Payer: Self-pay

## 2020-01-16 ENCOUNTER — Encounter (HOSPITAL_COMMUNITY)
Admission: RE | Admit: 2020-01-16 | Discharge: 2020-01-16 | Disposition: A | Discharge status: Home / Self Care | Payer: PRIVATE HEALTH INSURANCE | Source: Ambulatory Visit | Attending: Gynecologic Oncology | Admitting: Gynecologic Oncology

## 2020-01-16 DIAGNOSIS — Z01818 Encounter for other preprocedural examination: Secondary | ICD-10-CM | POA: Diagnosis not present

## 2020-01-16 DIAGNOSIS — I10 Essential (primary) hypertension: Secondary | ICD-10-CM | POA: Diagnosis not present

## 2020-01-16 HISTORY — DX: Migraine, unspecified, not intractable, without status migrainosus: G43.909

## 2020-01-16 HISTORY — DX: Personal history of antineoplastic chemotherapy: Z92.21

## 2020-01-16 HISTORY — DX: Calculus of gallbladder without cholecystitis without obstruction: K80.20

## 2020-01-16 HISTORY — DX: Other specified postprocedural states: Z98.890

## 2020-01-16 HISTORY — DX: Personal history of other diseases of the nervous system and sense organs: Z86.69

## 2020-01-16 HISTORY — DX: Other specified postprocedural states: R11.2

## 2020-01-16 NOTE — H&P (View-Only) (Signed)
Patient here for a pre-operative appointment prior to her scheduled surgery on February 02, 2020. She is scheduled for a robotic assisted bilateral salpingo-oophorectomy. Dr. Berline Lopes discussed the reason to consider a hysterectomy is for ease of hormonal manangement because there is a risk of uterine lining overgrowth, hyperplasia, and malignancy but that risk is not high enough that there is a strong recommendation that anyone on tamoxifen needs to have a hysterectomy to prevent that risk.  After this discussion, the patient has elected to move forward with BSO only. The surgery was discussed in detail.  See after visit summary for additional details. Visual aids used to discuss items related to surgery including the incentive spirometer, sequential compression stockings, foley catheter, IV pump, multi-modal pain regimen including tylenol, photo of the surgical robot, female reproductive system to discuss surgery in detail.      Discussed post-op pain management in detail including the aspects of the enhanced recovery pathway.  Advised her that a new prescription would be sent in for oxycodone and it is only to be used for after her upcoming surgery.  We discussed the use of tylenol post-op and to monitor for a maximum of 4,000 mg in a 24 hour period.  Also discussed use of bowel regimen medications as needed.   Discussed the use of heparin pre-op, SCDs, and measures to take at home to prevent DVT including frequent mobility.  Reportable signs and symptoms of DVT discussed. Post-operative instructions discussed and expectations for after surgery. Incisional care discussed as well including reportable signs and symptoms including erythema, drainage, wound separation.     10 minutes spent with the patient.  Verbalizing understanding of material discussed. No needs or concerns voiced at the end of the visit.   Advised patient and family to call for any needs.  Advised that her post-operative medications had been  prescribed and could be picked up at any time.

## 2020-01-16 NOTE — Patient Instructions (Signed)
DUE TO COVID-19 ONLY ONE VISITOR IS ALLOWED TO COME WITH YOU AND STAY IN THE WAITING ROOM ONLY DURING PRE OP AND PROCEDURE.   IF YOU WILL BE ADMITTED INTO THE HOSPITAL YOU ARE ALLOWED ONE SUPPORT PERSON DURING VISITATION HOURS ONLY (10AM -8PM)   . The support person may change daily. . The support person must pass our screening, gel in and out, and wear a mask at all times, including in the patient's room. . Patients must also wear a mask when staff or their support person are in the room.   COVID SWAB TESTING MUST BE COMPLETED ON:  Monday, 01-30-20 @   [address] [place], [place] [address]  (Must self quarantine after testing. Follow instructions on handout.)        Your procedure is scheduled on:   Thursday, 02-02-20   Report to [facility] Main  Entrance   Report to Short Stay at 5:30 AM   [facility])    Call this number if you have problems the morning of surgery [phone]    Follow a light diet the day before surgery, avoid gas producing foods   Do not eat food :After Midnight.   May have liquids until 4:30 AM day of surgery  CLEAR LIQUID DIET  Foods Allowed                                                                     Foods Excluded  Water, Black Coffee and tea, regular and decaf               liquids that you cannot  Plain Jell-O in any flavor  (No red)                                      see through such as: Fruit ices (not with fruit pulp)                                      milk, soups, orange juice              Iced Popsicles (No red)                                      All solid food                                   Apple juices Sports drinks like Gatorade (No red) Lightly seasoned clear broth or consume(fat free) Sugar, honey syrup   Oral Hygiene is also important to reduce your risk of infection.                                    Remember - BRUSH YOUR TEETH THE MORNING OF SURGERY WITH YOUR REGULAR TOOTHPASTE   Do NOT smoke after  Midnight   Take these medicines  the morning of surgery with A SIP OF WATER:  Amlodipine, Effexor                                You may not have any metal on your body including hair pins, jewelry, and body piercings             Do not wear make-up, lotions, powders, perfumes/cologne, or deodorant             Do not wear nail polish.  Do not shave  48 hours prior to surgery.             Do not bring valuables to the hospital. [place].   Contacts, dentures or bridgework may not be worn into surgery.     Patients discharged the day of surgery will not be allowed to drive home.   Special Instructions: Bring a copy of your healthcare power of attorney and living will documents         the day of surgery if you haven't scanned them in before.              Please read over the following fact sheets you were given: IF YOU HAVE QUESTIONS ABOUT YOUR PRE OP INSTRUCTIONS PLEASE CALL [phone]   [place] - Preparing for Surgery Before surgery, you can play an important role.  Because skin is not sterile, your skin needs to be as free of germs as possible.  You can reduce the number of germs on your skin by washing with CHG (chlorahexidine gluconate) soap before surgery.  CHG is an antiseptic cleaner which kills germs and bonds with the skin to continue killing germs even after washing. Please DO NOT use if you have an allergy to CHG or antibacterial soaps.  If your skin becomes reddened/irritated stop using the CHG and inform your nurse when you arrive at Short Stay. Do not shave (including legs and underarms) for at least 48 hours prior to the first CHG shower.  You may shave your face/neck.  Please follow these instructions carefully:  1.  Shower with CHG Soap the night before surgery and the  morning of surgery.  2.  If you choose to wash your hair, wash your hair first as usual with your normal  shampoo.  3.  After you shampoo, rinse your hair and  body thoroughly to remove the shampoo.                             4.  Use CHG as you would any other liquid soap.  You can apply chg directly to the skin and wash.  Gently with a scrungie or clean washcloth.  5.  Apply the CHG Soap to your body ONLY FROM THE NECK DOWN.   Do   not use on face/ open                           Wound or open sores. Avoid contact with eyes, ears mouth and   genitals (private parts).                       Wash face,  Genitals (private parts) with your normal soap.             6.  Wash thoroughly, paying special attention to the area where your    surgery  will be performed.  7.  Thoroughly rinse your body with warm water from the neck down.  8.  DO NOT shower/wash with your normal soap after using and rinsing off the CHG Soap.                9.  Pat yourself dry with a clean towel.            10.  Wear clean pajamas.            11.  Place clean sheets on your bed the night of your first shower and do not  sleep with pets. Day of Surgery : Do not apply any lotions/deodorants the morning of surgery.  Please wear clean clothes to the hospital/surgery center.  FAILURE TO FOLLOW THESE INSTRUCTIONS MAY RESULT IN THE CANCELLATION OF YOUR SURGERY  PATIENT SIGNATURE_________________________________  NURSE SIGNATURE__________________________________  ________________________________________________________________________  WHAT IS A BLOOD TRANSFUSION? Blood Transfusion Information  A transfusion is the replacement of blood or some of its parts. Blood is made up of multiple cells which provide different functions.  Red blood cells carry oxygen and are used for blood loss replacement.  White blood cells fight against infection.  Platelets control bleeding.  Plasma helps clot blood.  Other blood products are available for specialized needs, such as hemophilia or other clotting disorders. BEFORE THE TRANSFUSION  Who gives blood for transfusions?   Healthy  volunteers who are fully evaluated to make sure their blood is safe. This is blood bank blood. Transfusion therapy is the safest it has ever been in the practice of medicine. Before blood is taken from a donor, a complete history is taken to make sure that person has no history of diseases nor engages in risky social behavior (examples are intravenous drug use or sexual activity with multiple partners). The donor's travel history is screened to minimize risk of transmitting infections, such as malaria. The donated blood is tested for signs of infectious diseases, such as HIV and hepatitis. The blood is then tested to be sure it is compatible with you in order to minimize the chance of a transfusion reaction. If you or a relative donates blood, this is often done in anticipation of surgery and is not appropriate for emergency situations. It takes many days to process the donated blood. RISKS AND COMPLICATIONS Although transfusion therapy is very safe and saves many lives, the main dangers of transfusion include:   Getting an infectious disease.  Developing a transfusion reaction. This is an allergic reaction to something in the blood you were given. Every precaution is taken to prevent this. The decision to have a blood transfusion has been considered carefully by your caregiver before blood is given. Blood is not given unless the benefits outweigh the risks. AFTER THE TRANSFUSION  Right after receiving a blood transfusion, you will usually feel much better and more energetic. This is especially true if your red blood cells have gotten low (anemic). The transfusion raises the level of the red blood cells which carry oxygen, and this usually causes an energy increase.  The nurse administering the transfusion will monitor you carefully for complications. HOME CARE INSTRUCTIONS  No special instructions are needed after a transfusion. You may find your energy is better. Speak with your caregiver about any  limitations on activity for underlying diseases you may have. SEEK MEDICAL CARE IF:   Your condition is not improving after  your transfusion.  You develop redness or irritation at the intravenous (IV) site. SEEK IMMEDIATE MEDICAL CARE IF:  Any of the following symptoms occur over the next 12 hours:  Shaking chills.  You have a temperature by mouth above 102 F (38.9 C), not controlled by medicine.  Chest, back, or muscle pain.  People around you feel you are not acting correctly or are confused.  Shortness of breath or difficulty breathing.  Dizziness and fainting.  You get a rash or develop hives.  You have a decrease in urine output.  Your urine turns a dark color or changes to pink, red, or brown. Any of the following symptoms occur over the next 10 days:  You have a temperature by mouth above 102 F (38.9 C), not controlled by medicine.  Shortness of breath.  Weakness after normal activity.  The white part of the eye turns yellow (jaundice).  You have a decrease in the amount of urine or are urinating less often.  Your urine turns a dark color or changes to pink, red, or brown. Document Released: 02/08/2000 Document Revised: 05/05/2011 Document Reviewed: 09/27/2007 [facility] Patient Information 2014 [place], [place].  _______________________________________________________________________

## 2020-01-16 NOTE — Progress Notes (Signed)
COVID Vaccine Completed: Date COVID Vaccine completed: COVID vaccine manufacturer: [place]   PCP - Yaakov Guthrie, MD Cardiologist - Glori Bickers, MD  Chest x-ray -  EKG - 01-17-20 in Epic Stress Test -  ECHO - 04-23-17 in Epic for chemo eval Cardiac Cath -  Pacemaker/ICD device last checked:  Sleep Study -  CPAP -   Fasting Blood Sugar -  Checks Blood Sugar _____ times a day  Blood Thinner Instructions: Aspirin Instructions: Last Dose:  Anesthesia review:   Patient denies shortness of breath, fever, cough and chest pain at PAT appointment   Patient verbalized understanding of instructions that were given to them at the PAT appointment. Patient was also instructed that they will need to review over the PAT instructions again at home before surgery.

## 2020-01-16 NOTE — Progress Notes (Signed)
COVID Vaccine Completed: Yes Date COVID Vaccine completed:03/2019, 04/2019 COVID vaccine manufacturer:     Moderna     PCP - Yaakov Guthrie, MD Cardiologist - Glori Bickers, MD  Chest x-ray - N/A EKG - 01-17-20 in Epic Stress Test - N/A ECHO - 04-23-17 in Epic for chemo eval Cardiac Cath - N/A Pacemaker/ICD device last checked:N/A  Sleep Study - N/A CPAP - N/A  Fasting Blood Sugar - N/A Checks Blood Sugar _N/A____ times a day  Blood Thinner Instructions: N/A Aspirin Instructions: N/A Last Dose: N/A  Anesthesia review:  N/A  Patient denies shortness of breath, fever, cough and chest pain at PAT appointment   Patient verbalized understanding of instructions that were given to them at the PAT appointment. Patient was also instructed that they will need to review over the PAT instructions again at home before surgery.

## 2020-01-16 NOTE — Progress Notes (Signed)
Patient here for a pre-operative appointment prior to her scheduled surgery on February 02, 2020. She is scheduled for a robotic assisted bilateral salpingo-oophorectomy. Dr. Berline Lopes discussed the reason to consider a hysterectomy is for ease of hormonal manangement because there is a risk of uterine lining overgrowth, hyperplasia, and malignancy but that risk is not high enough that there is a strong recommendation that anyone on tamoxifen needs to have a hysterectomy to prevent that risk.  After this discussion, the patient has elected to move forward with BSO only. The surgery was discussed in detail.  See after visit summary for additional details. Visual aids used to discuss items related to surgery including the incentive spirometer, sequential compression stockings, foley catheter, IV pump, multi-modal pain regimen including tylenol, photo of the surgical robot, female reproductive system to discuss surgery in detail.      Discussed post-op pain management in detail including the aspects of the enhanced recovery pathway.  Advised her that a new prescription would be sent in for oxycodone and it is only to be used for after her upcoming surgery.  We discussed the use of tylenol post-op and to monitor for a maximum of 4,000 mg in a 24 hour period.  Also discussed use of bowel regimen medications as needed.   Discussed the use of heparin pre-op, SCDs, and measures to take at home to prevent DVT including frequent mobility.  Reportable signs and symptoms of DVT discussed. Post-operative instructions discussed and expectations for after surgery. Incisional care discussed as well including reportable signs and symptoms including erythema, drainage, wound separation.     10 minutes spent with the patient.  Verbalizing understanding of material discussed. No needs or concerns voiced at the end of the visit.   Advised patient and family to call for any needs.  Advised that her post-operative medications had been  prescribed and could be picked up at any time.

## 2020-01-16 NOTE — Patient Instructions (Signed)
DUE TO COVID-19 ONLY ONE VISITOR IS ALLOWED TO COME WITH YOU AND STAY IN THE WAITING ROOM ONLY DURING PRE OP AND PROCEDURE.   IF YOU WILL BE ADMITTED INTO THE HOSPITAL YOU ARE ALLOWED ONE SUPPORT PERSON DURING VISITATION HOURS ONLY (10AM -8PM)    The support person may change daily.  The support person must pass our screening, gel in and out, and wear a mask at all times, including in the patient's room.  Patients must also wear a mask when staff or their support person are in the room.              COVID SWAB TESTING MUST BE COMPLETED ON:  Monday, 01-30-20 @ 3PM                         [address] [place], [place] [address]  (Must self quarantine after testing. Follow instructions on handout.)                   Your procedure is scheduled on:   Thursday, 02-02-20              Report to [facility] Main  Entrance              Report to Short Stay at 5:30 AM   [facility])              Call this number if you have problems the morning of surgery [phone]                          Follow a light diet the day before surgery, avoid gas producing foods              Do not eat food :After Midnight.              May have liquids until 4:30 AM day of surgery  CLEAR LIQUID DIET  Foods Allowed                                                                     Foods Excluded  Water, Black Coffee and tea, regular and decaf               liquids that you cannot  Plain Jell-O in any flavor  (No red)                                      see through such as: Fruit ices (not with fruit pulp)                                             milk, soups, orange juice              Iced Popsicles (No red)  All solid food                                   Apple juices Sports drinks like Gatorade (No red) Lightly seasoned clear broth or consume(fat free) Sugar, honey syrup  Oral Hygieneis also important to reduce your  risk of infection.                                   Remember - BRUSH YOUR TEETH THE MORNING OF SURGERY WITH YOUR REGULAR TOOTHPASTE              Do NOT smoke after Midnight              Take these medicines the morning of surgery with A SIP OF WATER:  Tamoxifen, Effexor                                You may not have any metal on your body including hair pins, jewelry, and body piercings             Do not wear make-up, lotions, powders, perfumes/cologne, or deodorant             Do not wear nail polish.  Do not shave  48 hours prior to surgery.                        Do not bring valuables to the hospital. [place].              Contacts, dentures or bridgework may not be worn into surgery.                           Patients discharged the day of surgery will not be allowed to drive home.              Special Instructions: Bring a copy of your healthcare power of attorney and living will documents         the day of surgery if you haven't scanned them in before.              Please read over the following fact sheets you were given: IF YOU HAVE QUESTIONS ABOUT YOUR PRE OP INSTRUCTIONS PLEASE CALL [phone]   [place] - Preparing for Surgery Before surgery, you can play an important role.  Because skin is not sterile, your skin needs to be as free of germs as possible.  You can reduce the number of germs on your skin by washing with CHG (chlorahexidine gluconate) soap before surgery.  CHG is an antiseptic cleaner which kills germs and bonds with the skin to continue killing germs even after washing. Please DO NOT use if you have an allergy to CHG or antibacterial soaps.  If your skin becomes reddened/irritated stop using the CHG and inform your nurse when you arrive at Short Stay. Do not shave (including legs and underarms) for at least 48 hours prior to the first CHG shower.  You may shave your face/neck.  Please follow these  instructions carefully:             1.  Shower with CHG Soap the night before surgery  and the  morning of surgery.             2.  If you choose to wash your hair, wash your hair first as usual with your normal  shampoo.             3.  After you shampoo, rinse your hair and body thoroughly to remove the shampoo.                                                 4.  Use CHG as you would any other liquid soap.  You can apply chg directly to the skin and wash.  Gently with a scrungie or clean washcloth.             5.  Apply the CHG Soap to your body ONLY FROM THE NECK DOWN.   Do                not use on face/ open                           Wound or open sores. Avoid contact with eyes, ears mouth and                        genitals (private parts).                       Wash face,  Genitals (private parts) with your normal soap.             6.  Wash thoroughly, paying special attention to the area where your                                     surgery  will be performed.             7.  Thoroughly rinse your body with warm water from the neck down.             8.  DO NOT shower/wash with your normal soap after using and rinsing off the CHG Soap.                9.  Pat yourself dry with a clean towel.            10.  Wear clean pajamas.            11.  Place clean sheets on your bed the night of your first shower and do not  sleep with pets. Day of Surgery : Do not apply any lotions/deodorants the morning of surgery.  Please wear clean clothes to the hospital/surgery center.  FAILURE TO FOLLOW THESE INSTRUCTIONS MAY RESULT IN THE CANCELLATION OF YOUR SURGERY  PATIENT SIGNATURE_________________________________  NURSE SIGNATURE__________________________________  ________________________________________________________________________  WHAT IS A BLOOD TRANSFUSION? Blood Transfusion Information  A transfusion is the replacement of blood or some of its parts. Blood is made up of multiple  cells which provide different functions.  Red blood cells carry oxygen and are used for blood loss replacement.  White blood cells fight against infection.  Platelets control bleeding.  Plasma helps clot blood.  Other blood products are available for specialized needs, such as hemophilia or other clotting  disorders. BEFORE THE TRANSFUSION  Who gives blood for transfusions?   Healthy volunteers who are fully evaluated to make sure their blood is safe. This is blood bank blood. Transfusion therapy is the safest it has ever been in the practice of medicine. Before blood is taken from a donor, a complete history is taken to make sure that person has no history of diseases nor engages in risky social behavior (examples are intravenous drug use or sexual activity with multiple partners). The donor's travel history is screened to minimize risk of transmitting infections, such as malaria. The donated blood is tested for signs of infectious diseases, such as HIV and hepatitis. The blood is then tested to be sure it is compatible with you in order to minimize the chance of a transfusion reaction. If you or a relative donates blood, this is often done in anticipation of surgery and is not appropriate for emergency situations. It takes many days to process the donated blood. RISKS AND COMPLICATIONS Although transfusion therapy is very safe and saves many lives, the main dangers of transfusion include:   Getting an infectious disease.  Developing a transfusion reaction. This is an allergic reaction to something in the blood you were given. Every precaution is taken to prevent this. The decision to have a blood transfusion has been considered carefully by your caregiver before blood is given. Blood is not given unless the benefits outweigh the risks. AFTER THE TRANSFUSION  Right after receiving a blood transfusion, you will usually feel much better and more energetic. This is especially true if your red  blood cells have gotten low (anemic). The transfusion raises the level of the red blood cells which carry oxygen, and this usually causes an energy increase.  The nurse administering the transfusion will monitor you carefully for complications. HOME CARE INSTRUCTIONS  No special instructions are needed after a transfusion. You may find your energy is better. Speak with your caregiver about any limitations on activity for underlying diseases you may have. SEEK MEDICAL CARE IF:   Your condition is not improving after your transfusion.  You develop redness or irritation at the intravenous (IV) site. SEEK IMMEDIATE MEDICAL CARE IF:  Any of the following symptoms occur over the next 12 hours:  Shaking chills.  You have a temperature by mouth above 102 F (38.9 C), not controlled by medicine.  Chest, back, or muscle pain.  People around you feel you are not acting correctly or are confused.  Shortness of breath or difficulty breathing.  Dizziness and fainting.  You get a rash or develop hives.  You have a decrease in urine output.  Your urine turns a dark color or changes to pink, red, or brown. Any of the following symptoms occur over the next 10 days:  You have a temperature by mouth above 102 F (38.9 C), not controlled by medicine.  Shortness of breath.  Weakness after normal activity.  The white part of the eye turns yellow (jaundice).  You have a decrease in the amount of urine or are urinating less often.  Your urine turns a dark color or changes to pink, red, or brown.

## 2020-01-16 NOTE — Progress Notes (Signed)
[place]  [phone] [phone]) [phone]    ID: Virginia [name] DOB: December 04, 1980  MR#: [id number]  RPR#:[id number]  Patient Care Team: Virginia Shanks, MD as PCP - General (Family Medicine) Virginia Klein, MD as Consulting Physician (General Surgery) Virginia [name], Virginia Dad, MD as Consulting Physician (Oncology) Virginia Gibson, MD as Attending Physician (Radiation Oncology) Virginia Bison Charlestine Massed, NP as Nurse Practitioner (Hematology and Oncology) OTHER MD:   CHIEF COMPLAINT:  Estrogen receptor positive breast cancer  CURRENT TREATMENT: goserelin, tamoxifen   INTERVAL HISTORY: Virginia [name] returns today for 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   She continues on tamoxifen.  She still has issues with hot flashes particularly at night.  She does take gabapentin which is a bit helpful.  She also uses a fan at night.  When she is stressed hot flashes are worse.  She has minimal vaginal wetness issues.  She also continues on goserelin.  She is currently at the every 71-monthdose.  She has a dose due today.  She was referred to Dr. TBerline [name] 06/27/2019 to discuss potential therapeutic BSO. 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 She is currently scheduled to undergo BSO on 02/02/2020.  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  If she passes her boards which we do not doubt she [place].  She is very excited about this.  She is studying very hard and is not exercising regularly.  A detailed review of systems was otherwise stable   COVID 19 VACCINATION STATUS: That is post motor now x2, most recently March 2021, no booster as of 01/14/2020   BREAST CANCER HISTORY: From the original intake note:  SBayleighnoted some itching on her breasts  and since this was the symptom that preceded her grandmother's breast cancer she [facility]. The breast density was category B. In the left breast central to the nipple there was a 1.7 cm irregular high density mass with pleomorphic calcifications. Accordingly the patient was brought back 01/14/2016 for left diagnostic mammography and ultrasonography. This confirmed a 0.8 cm irregular mass in the left breast at the 10:00 position. 0.5 cm from the nipple lateral and posterior to that there was a 0.2 cm cluster of amorphous calcifications with an associated density. Anterior to the index mass was a 0.5 cm cluster of amorphous calcifications. All 3 findings taken together 3 cm. Ultrasound of the left breast found a 1 cm irregular mass in the left breast at the 10:00 position retroareolarly. There were no other sonographic abnormalities and the left axilla was sonographically benign  On 01/15/2016 the patient underwent core needle biopsy of the left breast index lesion at 10:00, and this found invasive ductal carcinoma, E-cadherin positive, estrogen receptor 90% positive, progesterone receptor 40% positive, both with moderate staining intensity, with an MIB-1 of 5%, but HER-2 amplified, with a signals ratio of 2.41 and the number per cell 6.50. One of the 2 areas of abnormal calcifications, the more posterior one measuring 0.2 cm, was also biopsied and showed only atypical ductal hyperplasia (S [id number] and [id number]).  Her subsequent history is as detailed below   PAST MEDICAL HISTORY: Past Medical History:  Diagnosis Date  . Anemia    as a child  .  Anxiety   . Cholelithiasis    with pregnancy  . Elevated liver enzymes    during pregnancy March 2015  . Family history of breast cancer   . Family history of prostate cancer   . GERD (gastroesophageal reflux disease)    one episode with pregnancy  . History of chemotherapy   . History of  neuropathy    with chemo  . History of radiation therapy 06/11/16- 07/23/16   Left Breast 50 Gy in 25 fractions, Left Breast boost 10 Gy in 5 fractions.   . Hypertension   . Hypothyroidism    with pregnancy  . Lymphedema of arm    LEFT  . Malignant neoplasm of upper-inner quadrant of left female breast (Virginia [name]) 01/16/2016  . Migraine   . PONV (postoperative nausea and vomiting)     PAST SURGICAL HISTORY: Past Surgical History:  Procedure Laterality Date  . BREAST LUMPECTOMY WITH RADIOACTIVE SEED AND SENTINEL LYMPH NODE BIOPSY Left 03/13/2016   Procedure: LEFT BREAST LUMPECTOMY WITH RADIOACTIVE SEED X 2 AND SENTINEL LYMPH NODE BIOPSY;  Surgeon: Virginia Klein, MD;  Location: [facility];  Service: General;  Laterality: Left;  . NO PAST SURGERIES    . PORT-A-CATH REMOVAL N/A 04/16/2017   Procedure: REMOVAL PORT-A-CATH;  Surgeon: Virginia Klein, MD;  Location: [place];  Service: General;  Laterality: N/A;  . PORTACATH PLACEMENT Right 03/13/2016   Procedure: INSERTION PORT-A-CATH;  Surgeon: Virginia Klein, MD;  Location: [place];  Service: General;  Laterality: Right;    FAMILY HISTORY: Family History  Problem Relation Age of Onset  . Hypertension Mother   . Hypertension Father   . Prostate cancer Father   . Liver cancer Maternal Grandmother   . Breast cancer Paternal Grandmother        dx in her 89s  . Lung cancer Paternal Aunt        smoker  . Heart attack Paternal Grandfather   . Liver cancer Other    The patient's parents are in their mid 38s as of November 2017. The patient father was diagnosed with prostate cancer at age 32. A paternal aunt was diagnosed with lung cancer in her late 9s. Also the paternal grandmother was diagnosed with breast cancer at the age of 29. On the mother's side there are 2 cases of liver cancer, age of diagnosis unknown.   GYNECOLOGIC HISTORY:  No LMP recorded. (Menstrual status: Other).  Menarche age 59, first live birth age 76, she is [place] P2. She was still having  periods at the time of breast cancer diagnosis.. She remotely used a number ring and oral contraceptives but stopped in 2016.more recently they used barrier methods for contraception.   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  She will be completing a nurse practitioner program December 2021 and taking her boards that month..  Her husband [name] works as a Printmaker for a Forensic psychologist. Their children are [name] age 5 and playing the cello in the school orchestra and [place] who is also doing very well in school. Danille is not a church attender   ADVANCED DIRECTIVES: In the absence of any documents to the contrary the patient's husband is her healthcare power of attorney   HEALTH MAINTENANCE: Social History   Tobacco Use  . Smoking status: Never Smoker  . Smokeless tobacco: Never Used  Vaping Use  . Vaping Use: Never used  Substance Use Topics  . Alcohol use: No  .  Drug use: No     Colonoscopy:  PAP: 02/2019, abnormal  Bone density:   Allergies  Allergen Reactions  . Amoxicillin Diarrhea and Nausea Only    Has patient had a PCN reaction causing immediate rash, facial/tongue/throat swelling, SOB or lightheadedness with hypotension: No Has patient had a PCN reaction causing severe rash involving mucus membranes or skin necrosis: No Has patient had a PCN reaction that required hospitalization No Has patient had a PCN reaction occurring within the last 10 years: Yes If all of the above answers are "NO", then may proceed with Cephalosporin use.     Current Outpatient Medications  Medication Sig Dispense Refill  . acetaminophen (TYLENOL) 325 MG tablet Take 650 mg by mouth every 6 (six) hours as needed for moderate pain or headache.     Marland Kitchen amLODipine (NORVASC) 5 MG tablet Take 5 mg by mouth daily.    Marland Kitchen b complex vitamins tablet Take 1 tablet by mouth daily. (Patient not taking: Reported on 01/12/2020)    . Biotin w/  Vitamins C & E (HAIR/SKIN/NAILS PO) Take 2 tablets by mouth at bedtime.    . docusate sodium (COLACE) 100 MG capsule Take 100 mg by mouth daily as needed for mild constipation.    . gabapentin (NEURONTIN) 300 MG capsule TAKE 1 CAPSULE BY MOUTH DAILY AT BEDTIME (Patient taking differently: Take 300 mg by mouth at bedtime as needed (pain). ) 90 capsule 3  . goserelin (ZOLADEX) 10.8 MG injection Inject 10.8 mg into the skin every 3 (three) months.     Marland Kitchen ibuprofen (ADVIL,MOTRIN) 200 MG tablet Take 400 mg by mouth every 8 (eight) hours as needed for mild pain.  (Patient not taking: Reported on 01/12/2020)    . lidocaine-prilocaine (EMLA) cream Apply 1 application topically as needed. (Patient not taking: Reported on 01/12/2020) 30 g 2  . MAGNESIUM PO Take 300 mg by mouth at bedtime.    . Menthol, Topical Analgesic, (BIOFREEZE EX) Apply 1 application topically daily as needed (pain).    . Multiple Vitamins-Minerals (MULTIVITAMIN GUMMIES ADULT PO) Take 2 tablets by mouth daily.    . NONFORMULARY OR COMPOUNDED ITEM Vitamin E vaginal suppositories 200u/ml.  One pv three times weekly. (Patient taking differently: Place 1 suppository vaginally 3 (three) times a week. Vitamin E vaginal suppositories 200u/ml.) 36 each 3  . Omega-3 Fatty Acids (FISH OIL PO) Take 2 capsules by mouth daily.     . tamoxifen (NOLVADEX) 20 MG tablet TAKE 1 TABLET BY MOUTH EVERY DAY (Patient taking differently: Take 20 mg by mouth daily. ) 90 tablet 3  . venlafaxine XR (EFFEXOR-XR) 150 MG 24 hr capsule TAKE 1 CAPSULE BY MOUTH EVERY MORNING WITH BREAKFAST (Patient taking differently: Take 150 mg by mouth daily with breakfast. ) 90 capsule 5   No current facility-administered medications for this visit.    OBJECTIVE: African-American woman in no acute distress  Vitals:   01/17/20 0932  BP: 119/75  Pulse: 82  Resp: 18  Temp: 97.7 F (36.5 C)  SpO2: 96%     Body mass index is 29.44 kg/m.    ECOG FS:1 - Symptomatic but completely  ambulatory   Sclerae unicteric, EOMs intact Wearing a mask No cervical or supraclavicular adenopathy Lungs no rales or rhonchi Heart regular rate and rhythm Abd soft, nontender, positive bowel sounds MSK no focal spinal tenderness, no upper extremity lymphedema Neuro: nonfocal, well oriented, appropriate affect Breasts: The right breast is unremarkable.  She tells me she occasionally has  a little bit of itching in the upper portion of the right areola, but by inspection and palpation that area is completely normal.  The left breast is status post lumpectomy and radiation.  There is some coarsening of the skin and slight distortion of the nipple secondary to treatment but no evidence of disease recurrence.  Both axillae are benign.   LAB RESULTS:  CMP     Component Value Date/Time   NA 140 06/22/2019 1314   NA 142 02/27/2017 1350   K 4.1 06/22/2019 1314   K 3.7 02/27/2017 1350   CL 105 06/22/2019 1314   CO2 29 06/22/2019 1314   CO2 28 02/27/2017 1350   GLUCOSE 88 06/22/2019 1314   GLUCOSE 124 02/27/2017 1350   BUN 8 06/22/2019 1314   BUN 8.9 02/27/2017 1350   CREATININE 0.79 06/22/2019 1314   CREATININE 0.79 07/31/2017 1349   CREATININE 0.8 02/27/2017 1350   CALCIUM 9.0 06/22/2019 1314   CALCIUM 9.5 02/27/2017 1350   PROT 7.2 06/22/2019 1314   PROT 7.3 02/27/2017 1350   ALBUMIN 3.8 06/22/2019 1314   ALBUMIN 3.8 02/27/2017 1350   AST 18 06/22/2019 1314   AST 24 07/31/2017 1349   AST 21 02/27/2017 1350   ALT 17 06/22/2019 1314   ALT 19 07/31/2017 1349   ALT 25 02/27/2017 1350   ALKPHOS 74 06/22/2019 1314   ALKPHOS 82 02/27/2017 1350   BILITOT 0.3 06/22/2019 1314   BILITOT <0.2 (L) 07/31/2017 1349   BILITOT 0.22 02/27/2017 1350   GFRNONAA >60 06/22/2019 1314   GFRNONAA >60 07/31/2017 1349   GFRAA >60 06/22/2019 1314   GFRAA >60 07/31/2017 1349    INo results found for: SPEP, UPEP  Lab Results  Component Value Date   WBC 4.4 01/17/2020   NEUTROABS 1.5 (L)  01/17/2020   HGB 12.8 01/17/2020   HCT 39.6 01/17/2020   MCV 84.4 01/17/2020   PLT 188 01/17/2020      Chemistry      Component Value Date/Time   NA 140 06/22/2019 1314   NA 142 02/27/2017 1350   K 4.1 06/22/2019 1314   K 3.7 02/27/2017 1350   CL 105 06/22/2019 1314   CO2 29 06/22/2019 1314   CO2 28 02/27/2017 1350   BUN 8 06/22/2019 1314   BUN 8.9 02/27/2017 1350   CREATININE 0.79 06/22/2019 1314   CREATININE 0.79 07/31/2017 1349   CREATININE 0.8 02/27/2017 1350      Component Value Date/Time   CALCIUM 9.0 06/22/2019 1314   CALCIUM 9.5 02/27/2017 1350   ALKPHOS 74 06/22/2019 1314   ALKPHOS 82 02/27/2017 1350   AST 18 06/22/2019 1314   AST 24 07/31/2017 1349   AST 21 02/27/2017 1350   ALT 17 06/22/2019 1314   ALT 19 07/31/2017 1349   ALT 25 02/27/2017 1350   BILITOT 0.3 06/22/2019 1314   BILITOT <0.2 (L) 07/31/2017 1349   BILITOT 0.22 02/27/2017 1350       No results found for: LABCA2  No components found for: LABCA125  No results for input(s): INR in the last 168 hours.  Urinalysis    Component Value Date/Time   COLORURINE YELLOW 10/14/2012 1954   APPEARANCEUR CLEAR 10/14/2012 1954   LABSPEC >1.030 (H) 10/14/2012 1954   PHURINE 6.0 10/14/2012 1954   GLUCOSEU NEGATIVE 10/14/2012 1954   HGBUR TRACE (A) 10/14/2012 [place] NEGATIVE 10/14/2012 1954   KETONESUR 15 (A) 10/14/2012 [employer] NEGATIVE 10/14/2012 1954  UROBILINOGEN 0.2 10/14/2012 1954   NITRITE NEGATIVE 10/14/2012 1954   LEUKOCYTESUR NEGATIVE 10/14/2012 1954    STUDIES: No results found.   ELIGIBLE FOR AVAILABLE RESEARCH PROTOCOL: no   ASSESSMENT: 38 y.o.  woman status post left breast upper inner quadrant biopsy 01/15/2016 for a clinical T1c N0, stage IA invasive ductal carcinoma, E-cadherin positive, estrogen receptor 90% positive, progesterone receptor 40% positive, both with moderate staining intensity, with an MIB-1 of 5%, but HER-2 amplified, with a  signals ratio of 2.41 and the number per cell 6.50.  (a) one of 2 areas of abnormal calcifications, the more posterior one measuring 0.2 cm, was also biopsied and showed only atypical ductal hyperplasia  (b) a 4.1 cm area of non-masslike enhancement in the left breast underwent biopsy 02/19/2016 showing only fibrocystic changes  (1) tamoxifen started 01/23/2016 neoadjuvantly  (2) genetics testing 02/22/2016 through the Breast/Ovarian gene panel offered by [facility] found no deleterious mutations in  ATM, BARD1, BRCA1, BRCA2, BRIP1, CDH1, CHEK2, EPCAM, FANCC, MLH1, MSH2, MSH6, NBN, PALB2, PMS2, PTEN, RAD51C, RAD51D, TP53, and XRCC2.     (3) status post left lumpectomy and sentinel lymph node sampling 03/13/2016 for an mpT1c pN0, stage IA invasive ductal carcinoma, grade 2, with negative margins.  (4) paclitaxel weekly 12 and trastuzumab every 21 days beginning on 04/10/2016.  (a) paclitaxel discontinued after 5 doses because of atypical neuropathy symptoms. Last dose 04/28/2016  (5) trastuzumab continued to complete a year--last dose 04/10/2017  (a) echocardiogram 02/11/2016 shows ejection fraction in the 60-65% range.  (b) echo 05/28/2916 shows EF in the 65-70% range  (c) echo on 09/26/2016 shows LVEF of 55-60%  (d) echo on 01/14/2017 shows EF in the 60-65%  (6) adjuvant radiation 06/11/16 - 07/23/16 Site/dose:    1) Left Breast / 50 Gy in 25 fractions 2) Left Breast Boost / 10 Gy in 5 fractions  (7) tamoxifen resumed to 07/25/2016  (8) patient resumed menses post chemo, April 2018  (a) goserelin started 06/17/2016, initially given every 28 days, changed to every 12 weeks on 11/12/16 with estradiol monitoring  (b) referred 06/22/2019 for BSO   PLAN: [name] is now close to 4 years out from definitive surgery for her breast cancer with no evidence of disease recurrence.  This is very favorable.  She is tolerating tamoxifen moderately well.  I think it would be a good idea for her not to take  gabapentin in the next couple of weeks before her exam.  Even though she is at a very low dose nevertheless that can make people a little bit sleepy and a little bit less sharp and she wants to be on top of things when she takes her test.  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  I have encouraged her to resume routine exercise program.   She is due for bilateral salpingo-oophorectomy in December of this year.  Despite the goserelin I find that patients have worsening menopausal symptoms after bilateral salpingo-oophorectomy and she might expect that.   She will see me again in April 2022.  Total encounter time 25 minutes.Sarajane Jews C. Rushton Early, MD  01/17/20 9:52 AM Medical Oncology and Hematology [facility] [place], [place] [address] Tel. [phone]    Fax. [phone]   I, Wilburn Mylar, am acting as scribe for Dr. Virgie [name]. Marieke Lubke.  Lindie Spruce MD, have reviewed the above documentation for accuracy and  completeness, and I agree with the above.   *Total Encounter Time as defined by the Centers for Medicare and Medicaid Services includes, in addition to the face-to-face time of a patient visit (documented in the note above) non-face-to-face time: obtaining and reviewing outside history, ordering and reviewing medications, tests or procedures, care coordination (communications with other health care professionals or caregivers) and documentation in the medical record.

## 2020-01-17 ENCOUNTER — Inpatient Hospital Stay (HOSPITAL_BASED_OUTPATIENT_CLINIC_OR_DEPARTMENT_OTHER): Payer: PRIVATE HEALTH INSURANCE | Admitting: Oncology

## 2020-01-17 ENCOUNTER — Inpatient Hospital Stay (HOSPITAL_BASED_OUTPATIENT_CLINIC_OR_DEPARTMENT_OTHER): Payer: PRIVATE HEALTH INSURANCE | Admitting: Gynecologic Oncology

## 2020-01-17 ENCOUNTER — Encounter: Payer: Self-pay | Admitting: Gynecologic Oncology

## 2020-01-17 ENCOUNTER — Encounter (HOSPITAL_COMMUNITY)
Admission: RE | Admit: 2020-01-17 | Discharge: 2020-01-17 | Disposition: A | Discharge status: Home / Self Care | Payer: PRIVATE HEALTH INSURANCE | Source: Ambulatory Visit | Attending: Gynecologic Oncology | Admitting: Gynecologic Oncology

## 2020-01-17 ENCOUNTER — Other Ambulatory Visit: Payer: Self-pay

## 2020-01-17 ENCOUNTER — Inpatient Hospital Stay: Payer: PRIVATE HEALTH INSURANCE | Attending: Oncology

## 2020-01-17 ENCOUNTER — Inpatient Hospital Stay: Payer: PRIVATE HEALTH INSURANCE

## 2020-01-17 VITALS — BP 119/75 | HR 82 | Temp 97.7°F | Resp 18 | Ht 67.0 in | Wt 188.0 lb

## 2020-01-17 DIAGNOSIS — Z923 Personal history of irradiation: Secondary | ICD-10-CM | POA: Diagnosis not present

## 2020-01-17 DIAGNOSIS — C50212 Malignant neoplasm of upper-inner quadrant of left female breast: Secondary | ICD-10-CM

## 2020-01-17 DIAGNOSIS — Z79810 Long term (current) use of selective estrogen receptor modulators (SERMs): Secondary | ICD-10-CM | POA: Diagnosis not present

## 2020-01-17 DIAGNOSIS — Z170 Estrogen receptor positive status [ER+]: Secondary | ICD-10-CM

## 2020-01-17 DIAGNOSIS — Z95828 Presence of other vascular implants and grafts: Secondary | ICD-10-CM

## 2020-01-17 DIAGNOSIS — Z9221 Personal history of antineoplastic chemotherapy: Secondary | ICD-10-CM | POA: Diagnosis not present

## 2020-01-17 LAB — CBC WITH DIFFERENTIAL/PLATELET
Abs Immature Granulocytes: 0.01 10*3/uL (ref 0.00–0.07)
Basophils Absolute: 0.1 10*3/uL (ref 0.0–0.1)
Basophils Relative: 1 %
Eosinophils Absolute: 0.1 10*3/uL (ref 0.0–0.5)
Eosinophils Relative: 3 %
HCT: 39.6 % (ref 36.0–46.0)
Hemoglobin: 12.8 g/dL (ref 12.0–15.0)
Immature Granulocytes: 0 %
Lymphocytes Relative: 48 %
Lymphs Abs: 2.1 10*3/uL (ref 0.7–4.0)
MCH: 27.3 pg (ref 26.0–34.0)
MCHC: 32.3 g/dL (ref 30.0–36.0)
MCV: 84.4 fL (ref 80.0–100.0)
Monocytes Absolute: 0.6 10*3/uL (ref 0.1–1.0)
Monocytes Relative: 13 %
Neutro Abs: 1.5 10*3/uL — ABNORMAL LOW (ref 1.7–7.7)
Neutrophils Relative %: 35 %
Platelets: 188 10*3/uL (ref 150–400)
RBC: 4.69 MIL/uL (ref 3.87–5.11)
RDW: 13.3 % (ref 11.5–15.5)
WBC: 4.4 10*3/uL (ref 4.0–10.5)
nRBC: 0 % (ref 0.0–0.2)

## 2020-01-17 LAB — COMPREHENSIVE METABOLIC PANEL
ALT: 15 U/L (ref 0–44)
AST: 18 U/L (ref 15–41)
Albumin: 3.9 g/dL (ref 3.5–5.0)
Alkaline Phosphatase: 71 U/L (ref 38–126)
Anion gap: 10 (ref 5–15)
BUN: 10 mg/dL (ref 6–20)
CO2: 26 mmol/L (ref 22–32)
Calcium: 9.2 mg/dL (ref 8.9–10.3)
Chloride: 105 mmol/L (ref 98–111)
Creatinine, Ser: 0.91 mg/dL (ref 0.44–1.00)
GFR, Estimated: >60 mL/min (ref >60)
Glucose, Bld: 90 mg/dL (ref 70–99)
Potassium: 4.2 mmol/L (ref 3.5–5.1)
Sodium: 141 mmol/L (ref 135–145)
Total Bilirubin: 0.3 mg/dL (ref 0.3–1.2)
Total Protein: 7.5 g/dL (ref 6.5–8.1)

## 2020-01-17 MED ORDER — IBUPROFEN 800 MG PO TABS: 800.0000 mg | ORAL_TABLET | Freq: Three times a day (TID) | ORAL | 0 refills | Status: DC | PRN

## 2020-01-17 MED ORDER — OXYCODONE HCL 5 MG PO TABS: 5.0000 mg | ORAL_TABLET | ORAL | 0 refills | Status: DC | PRN

## 2020-01-17 MED ORDER — GOSERELIN ACETATE 10.8 MG ~~LOC~~ IMPL
10.8000 mg | DRUG_IMPLANT | Freq: Once | SUBCUTANEOUS | Status: AC
  Administered 2020-01-17: 10.8 mg via SUBCUTANEOUS
  Filled 2020-01-17: qty 10.8

## 2020-01-17 NOTE — Patient Instructions (Addendum)
Preparing for your Surgery  Plan for surgery on February 02, 2020 with Dr. Jeral Pinch at [place] will be scheduled for a robotic assisted laparoscopic bilateral salpingo-oophorectomy (removal of both fallopian tubes and ovaries).   Pre-operative Testing -You will receive a phone call from presurgical testing at [facility] to arrange for a pre-operative appointment, labwork, and COVID test. The COVID test normally happens 3 days prior to the surgery and they ask that you self quarantine after the test up until surgery to decrease chance of exposure.  -Bring your insurance card, copy of an advanced directive if applicable, medication list  -At that visit, you will be asked to sign a consent for a possible blood transfusion in case a transfusion becomes necessary during surgery.  The need for a blood transfusion is rare but having consent is a necessary part of your care.     -You should not be taking blood thinners or aspirin at least ten days prior to surgery unless instructed by your surgeon.  -Do not take supplements such as fish oil (omega 3), red yeast rice, turmeric before your surgery. STOP FISH OIL NOW.  Day Before Surgery at [place] will be asked to take in a light diet the day before surgery. You will be advised you can have clear liquids up until 3 hours before your surgery.    Eat a light diet the day before surgery.  Examples including soups, broths, toast, yogurt, mashed potatoes.  AVOID GAS PRODUCING FOODS. Things to avoid include carbonated beverages (fizzy beverages), raw fruits and raw vegetables, or beans.   If your bowels are filled with gas, your surgeon will have difficulty visualizing your pelvic organs which increases your surgical risks.  Your role in recovery Your role is to become active as soon as directed by your doctor, while still giving yourself time to heal.  Rest when you feel tired. You will be asked to do the following in  order to speed your recovery:  - Cough and breathe deeply. This helps to clear and expand your lungs and can prevent pneumonia after surgery.  - [place]. Do mild physical activity. Walking or moving your legs help your circulation and body functions return to normal. Do not try to get up or walk alone the first time after surgery.   -If you develop swelling on one leg or the other, pain in the back of your leg, redness/warmth in one of your legs, please call the office or go to the Emergency Room to have a doppler to rule out a blood clot. For shortness of breath, chest pain-seek care in the Emergency Room as soon as possible. - Actively manage your pain. Managing your pain lets you move in comfort. We will ask you to rate your pain on a scale of zero to 10. It is your responsibility to tell your doctor or nurse where and how much you hurt so your pain can be treated.  Special Considerations -If you are diabetic, you may be placed on insulin after surgery to have closer control over your blood sugars to promote healing and recovery.  This does not mean that you will be discharged on insulin.  If applicable, your oral antidiabetics will be resumed when you are tolerating a solid diet.  -Your final pathology results from surgery should be available around one week after surgery and the results will be relayed to you when available.  -Dr. Lahoma Crocker is  the surgeon that assists your GYN Oncologist with surgery.  If you end up staying the night, the next day after your surgery you will either see Dr. Denman George, Dr. Berline Lopes, or Dr. Lahoma Crocker.  -FMLA forms can be faxed to [phone] and please allow 5-7 business days for completion.  Pain Management After Surgery -You have been prescribed your pain medication before surgery so that you can have these available when you are discharged from the hospital. The pain medication is for use ONLY AFTER surgery and a new  prescription will not be given.   -Make sure that you have Tylenol and Ibuprofen at home to use on a regular basis after surgery for pain control. We recommend alternating the medications every hour to six hours since they work differently and are processed in the body differently for pain relief.  -Review the attached handout on narcotic use and their risks and side effects.   Bowel Regimen -You can use Colace stool softeners or Miralax as needed after surgery. It is important to prevent constipation and drink adequate amounts of liquids.   Risks of Surgery Risks of surgery are low but include bleeding, infection, damage to surrounding structures, re-operation, blood clots, and very rarely death.   Blood Transfusion Information (For the consent to be signed before surgery)  We will be checking your blood type before surgery so in case of emergencies, we will know what type of blood you would need.                                            WHAT IS A BLOOD TRANSFUSION?  A transfusion is the replacement of blood or some of its parts. Blood is made up of multiple cells which provide different functions.  Red blood cells carry oxygen and are used for blood loss replacement.  White blood cells fight against infection.  Platelets control bleeding.  Plasma helps clot blood.  Other blood products are available for specialized needs, such as hemophilia or other clotting disorders. BEFORE THE TRANSFUSION  Who gives blood for transfusions?   You may be able to donate blood to be used at a later date on yourself (autologous donation).  Relatives can be asked to donate blood. This is generally not any safer than if you have received blood from a stranger. The same precautions are taken to ensure safety when a relative's blood is donated.  Healthy volunteers who are fully evaluated to make sure their blood is safe. This is blood bank blood. Transfusion therapy is the safest it has ever been  in the practice of medicine. Before blood is taken from a donor, a complete history is taken to make sure that person has no history of diseases nor engages in risky social behavior (examples are intravenous drug use or sexual activity with multiple partners). The donor's travel history is screened to minimize risk of transmitting infections, such as malaria. The donated blood is tested for signs of infectious diseases, such as HIV and hepatitis. The blood is then tested to be sure it is compatible with you in order to minimize the chance of a transfusion reaction. If you or a relative donates blood, this is often done in anticipation of surgery and is not appropriate for emergency situations. It takes many days to process the donated blood. RISKS AND COMPLICATIONS Although transfusion therapy is very safe and saves  many lives, the main dangers of transfusion include:   Getting an infectious disease.  Developing a transfusion reaction. This is an allergic reaction to something in the blood you were given. Every precaution is taken to prevent this. The decision to have a blood transfusion has been considered carefully by your caregiver before blood is given. Blood is not given unless the benefits outweigh the risks.  AFTER SURGERY INSTRUCTIONS  Return to work: 2-4 weeks if applicable  Activity: 1. Be up and out of the bed during the day.  Take a nap if needed.  You may walk up steps but be careful and use the hand rail.  Stair climbing will tire you more than you think, you may need to stop part way and rest.   2. No lifting or straining for 6 weeks over 10 pounds. No pushing, pulling, straining for 6 weeks.  3. No driving for 1 week(s).  Do not drive if you are taking narcotic pain medicine and make sure that your reaction time has returned.   4. You can shower as soon as the next day after surgery. Shower daily.  Use your regular soap and water (not directly on the incision) and pat your  incision(s) dry afterwards; don't rub.  No tub baths or submerging your body in water until cleared by your surgeon. If you have the soap that was given to you by pre-surgical testing that was used before surgery, you do not need to use it afterwards because this can irritate your incisions.   5. No sexual activity and nothing in the vagina for 4 weeks.  6. You may experience a small amount of clear drainage from your incisions, which is normal.  If the drainage persists, increases, or changes color please call the office.  7. Do not use creams, lotions, or ointments such as neosporin on your incisions after surgery until advised by your surgeon because they can cause removal of the dermabond glue on your incisions.    8. You may experience vaginal spotting after surgery.  The spotting is normal but if you experience heavy bleeding, call our office.  9. Take Tylenol or ibuprofen first for pain and only use narcotic pain medication for severe pain not relieved by the Tylenol or Ibuprofen.  Monitor your Tylenol intake to a max of 4,000 mg in a 24 hour period. You can alternate these medications after surgery.  Diet: 1. Low sodium Heart Healthy Diet is recommended but you are cleared to resume your normal (before surgery) diet after your procedure.  2. It is safe to use a laxative, such as Miralax or Colace, if you have difficulty moving your bowels.   Wound Care: 1. Keep clean and dry.  Shower daily.  Reasons to call the Doctor:  Fever - Oral temperature greater than 100.4 degrees Fahrenheit  Foul-smelling vaginal discharge  Difficulty urinating  Nausea and vomiting  Increased pain at the site of the incision that is unrelieved with pain medicine.  Difficulty breathing with or without chest pain  New calf pain especially if only on one side  Sudden, continuing increased vaginal bleeding with or without clots.   Contacts: For questions or concerns you should contact:  Dr.  Jeral Pinch at [phone]  Joylene John, NP at [phone]  After Hours: call [phone] and have the GYN Oncologist paged/contacted (after 5 pm or on the weekends)

## 2020-01-17 NOTE — Patient Instructions (Signed)

## 2020-01-24 ENCOUNTER — Encounter: Payer: PRIVATE HEALTH INSURANCE | Admitting: Gynecologic Oncology

## 2020-01-30 ENCOUNTER — Other Ambulatory Visit (HOSPITAL_COMMUNITY)
Admission: RE | Admit: 2020-01-30 | Discharge: 2020-01-30 | Disposition: A | Discharge status: Home / Self Care | Payer: PRIVATE HEALTH INSURANCE | Source: Ambulatory Visit | Attending: Gynecologic Oncology | Admitting: Gynecologic Oncology

## 2020-01-30 DIAGNOSIS — Z01812 Encounter for preprocedural laboratory examination: Secondary | ICD-10-CM | POA: Diagnosis present

## 2020-01-30 DIAGNOSIS — Z20822 Contact with and (suspected) exposure to covid-19: Secondary | ICD-10-CM | POA: Diagnosis not present

## 2020-01-30 LAB — SARS CORONAVIRUS 2 (TAT 6-24 HRS): SARS Coronavirus 2: NEGATIVE

## 2020-02-01 ENCOUNTER — Telehealth: Payer: Self-pay

## 2020-02-01 NOTE — Telephone Encounter (Signed)
Virginia [name] states that she understands her written pre-op instructions. No questions or concerns noted at this time.

## 2020-02-01 NOTE — Anesthesia Preprocedure Evaluation (Addendum)
Anesthesia Evaluation  Patient identified by MRN, date of birth, ID band Patient awake    Reviewed: Allergy & Precautions, NPO status , Patient's Chart, lab work & pertinent test results  History of Anesthesia Complications (+) PONV  Airway Mallampati: II  TM Distance: >3 FB Neck ROM: Full    Dental  (+) Dental Advisory Given   Pulmonary neg pulmonary ROS,  01/30/2020 SARS coronavirus NEG   breath sounds clear to auscultation       Cardiovascular hypertension, Pt. on medications (-) angina Rhythm:Regular Rate:Normal     Neuro/Psych  Headaches, Anxiety    GI/Hepatic Neg liver ROS, GERD  Controlled,  Endo/Other  Hypothyroidism   Renal/GU negative Renal ROS     Musculoskeletal   Abdominal   Peds  Hematology negative hematology ROS (+)   Anesthesia Other Findings H/o breast cancer: XRT. chemo  Reproductive/Obstetrics                            Anesthesia Physical Anesthesia Plan  ASA: II  Anesthesia Plan: General   Post-op Pain Management:    Induction: Intravenous  PONV Risk Score and Plan: 4 or greater and Scopolamine patch - Pre-op, Dexamethasone, Treatment may vary due to age or medical condition and Ondansetron  Airway Management Planned: Oral ETT  Additional Equipment: None  Intra-op Plan:   Post-operative Plan: Extubation in OR  Informed Consent: I have reviewed the patients History and Physical, chart, labs and discussed the procedure including the risks, benefits and alternatives for the proposed anesthesia with the patient or authorized representative who has indicated his/her understanding and acceptance.     Dental advisory given  Plan Discussed with: CRNA and Surgeon  Anesthesia Plan Comments:        Anesthesia Quick Evaluation

## 2020-02-02 ENCOUNTER — Ambulatory Visit (HOSPITAL_COMMUNITY): Payer: PRIVATE HEALTH INSURANCE

## 2020-02-02 ENCOUNTER — Ambulatory Visit (HOSPITAL_COMMUNITY)
Admission: RE | Admit: 2020-02-02 | Discharge: 2020-02-03 | Disposition: A | Discharge status: Home / Self Care | Payer: PRIVATE HEALTH INSURANCE | Source: Ambulatory Visit | Attending: Gynecologic Oncology | Admitting: Gynecologic Oncology

## 2020-02-02 ENCOUNTER — Ambulatory Visit (HOSPITAL_COMMUNITY): Payer: PRIVATE HEALTH INSURANCE | Admitting: Anesthesiology

## 2020-02-02 ENCOUNTER — Encounter (HOSPITAL_COMMUNITY): Payer: Self-pay | Admitting: Gynecologic Oncology

## 2020-02-02 ENCOUNTER — Other Ambulatory Visit: Payer: Self-pay

## 2020-02-02 ENCOUNTER — Encounter (HOSPITAL_COMMUNITY)
Admission: RE | Disposition: A | Discharge status: Home / Self Care | Payer: Self-pay | Source: Ambulatory Visit | Attending: Gynecologic Oncology

## 2020-02-02 DIAGNOSIS — N8302 Follicular cyst of left ovary: Secondary | ICD-10-CM | POA: Diagnosis not present

## 2020-02-02 DIAGNOSIS — R0902 Hypoxemia: Secondary | ICD-10-CM

## 2020-02-02 DIAGNOSIS — C50919 Malignant neoplasm of unspecified site of unspecified female breast: Secondary | ICD-10-CM | POA: Diagnosis not present

## 2020-02-02 DIAGNOSIS — J385 Laryngeal spasm: Secondary | ICD-10-CM | POA: Insufficient documentation

## 2020-02-02 DIAGNOSIS — Z79899 Other long term (current) drug therapy: Secondary | ICD-10-CM | POA: Insufficient documentation

## 2020-02-02 DIAGNOSIS — C50212 Malignant neoplasm of upper-inner quadrant of left female breast: Secondary | ICD-10-CM | POA: Diagnosis not present

## 2020-02-02 DIAGNOSIS — N8301 Follicular cyst of right ovary: Secondary | ICD-10-CM | POA: Insufficient documentation

## 2020-02-02 DIAGNOSIS — Z4002 Encounter for prophylactic removal of ovary: Secondary | ICD-10-CM

## 2020-02-02 DIAGNOSIS — Z880 Allergy status to penicillin: Secondary | ICD-10-CM | POA: Diagnosis not present

## 2020-02-02 DIAGNOSIS — Z170 Estrogen receptor positive status [ER+]: Secondary | ICD-10-CM

## 2020-02-02 DIAGNOSIS — R1900 Intra-abdominal and pelvic swelling, mass and lump, unspecified site: Secondary | ICD-10-CM | POA: Insufficient documentation

## 2020-02-02 HISTORY — PX: ROBOTIC ASSISTED SALPINGO OOPHERECTOMY: SHX6082

## 2020-02-02 LAB — URINALYSIS, ROUTINE W REFLEX MICROSCOPIC
Bacteria, UA: NONE SEEN
Bilirubin Urine: NEGATIVE
Glucose, UA: NEGATIVE mg/dL
Hgb urine dipstick: NEGATIVE
Ketones, ur: NEGATIVE mg/dL
Nitrite: NEGATIVE
Protein, ur: NEGATIVE mg/dL
Specific Gravity, Urine: 1.014 (ref 1.005–1.030)
pH: 6 (ref 5.0–8.0)

## 2020-02-02 LAB — TYPE AND SCREEN
ABO/RH(D): O POS
Antibody Screen: NEGATIVE

## 2020-02-02 LAB — PREGNANCY, URINE: Preg Test, Ur: NEGATIVE

## 2020-02-02 SURGERY — SALPINGO-OOPHORECTOMY, ROBOT-ASSISTED
Anesthesia: General | Laterality: Bilateral

## 2020-02-02 MED ORDER — SENNOSIDES-DOCUSATE SODIUM 8.6-50 MG PO TABS
2.0000 | ORAL_TABLET | Freq: Every day | ORAL | Status: DC
  Administered 2020-02-02: 2 via ORAL
  Filled 2020-02-02: qty 2

## 2020-02-02 MED ORDER — LIDOCAINE HCL (PF) 2 % IJ SOLN
INTRAMUSCULAR | Status: DC | PRN
  Administered 2020-02-02: 1 mg/kg/h via INTRADERMAL

## 2020-02-02 MED ORDER — ONDANSETRON HCL 4 MG/2ML IJ SOLN
INTRAMUSCULAR | Status: AC
  Filled 2020-02-02: qty 2

## 2020-02-02 MED ORDER — MORPHINE SULFATE (PF) 2 MG/ML IV SOLN: 2.0000 mg | INTRAVENOUS | Status: DC | PRN

## 2020-02-02 MED ORDER — LIDOCAINE HCL (PF) 2 % IJ SOLN
INTRAMUSCULAR | Status: AC
  Filled 2020-02-02: qty 15

## 2020-02-02 MED ORDER — PROPOFOL 10 MG/ML IV BOLUS
INTRAVENOUS | Status: DC | PRN
  Administered 2020-02-02: 150 mg via INTRAVENOUS

## 2020-02-02 MED ORDER — HYDROMORPHONE HCL 1 MG/ML IJ SOLN
0.2500 mg | INTRAMUSCULAR | Status: DC | PRN
  Administered 2020-02-02 (×4): 0.25 mg via INTRAVENOUS

## 2020-02-02 MED ORDER — AMLODIPINE BESYLATE 5 MG PO TABS
5.0000 mg | ORAL_TABLET | Freq: Every day | ORAL | Status: DC
  Administered 2020-02-03: 08:00:00 5 mg via ORAL
  Filled 2020-02-02: qty 1

## 2020-02-02 MED ORDER — DEXAMETHASONE SODIUM PHOSPHATE 4 MG/ML IJ SOLN: 4.0000 mg | INTRAMUSCULAR | Status: DC

## 2020-02-02 MED ORDER — CELECOXIB 200 MG PO CAPS
400.0000 mg | ORAL_CAPSULE | ORAL | Status: AC
  Administered 2020-02-02: 400 mg via ORAL
  Filled 2020-02-02: qty 2

## 2020-02-02 MED ORDER — FUROSEMIDE 10 MG/ML IJ SOLN
INTRAMUSCULAR | Status: AC
  Filled 2020-02-02: qty 2

## 2020-02-02 MED ORDER — KETOROLAC TROMETHAMINE 30 MG/ML IJ SOLN
15.0000 mg | Freq: Once | INTRAMUSCULAR | Status: AC
  Administered 2020-02-02: 15 mg via INTRAVENOUS

## 2020-02-02 MED ORDER — PROMETHAZINE HCL 25 MG/ML IJ SOLN: 6.2500 mg | INTRAMUSCULAR | Status: DC | PRN

## 2020-02-02 MED ORDER — OXYCODONE HCL 5 MG/5ML PO SOLN: 5.0000 mg | Freq: Once | ORAL | Status: DC | PRN

## 2020-02-02 MED ORDER — EPHEDRINE 5 MG/ML INJ
INTRAVENOUS | Status: AC
  Filled 2020-02-02: qty 10

## 2020-02-02 MED ORDER — HYDROMORPHONE HCL 1 MG/ML IJ SOLN
INTRAMUSCULAR | Status: AC
  Filled 2020-02-02: qty 1

## 2020-02-02 MED ORDER — DEXAMETHASONE SODIUM PHOSPHATE 10 MG/ML IJ SOLN
INTRAMUSCULAR | Status: AC
  Filled 2020-02-02: qty 1

## 2020-02-02 MED ORDER — GABAPENTIN 300 MG PO CAPS
300.0000 mg | ORAL_CAPSULE | ORAL | Status: AC
  Administered 2020-02-02: 300 mg via ORAL
  Filled 2020-02-02: qty 1

## 2020-02-02 MED ORDER — SODIUM CHLORIDE 0.9% FLUSH: 3.0000 mL | INTRAVENOUS | Status: DC | PRN

## 2020-02-02 MED ORDER — ACETAMINOPHEN 650 MG RE SUPP: 650.0000 mg | RECTAL | Status: DC | PRN

## 2020-02-02 MED ORDER — FUROSEMIDE 10 MG/ML IJ SOLN
20.0000 mg | Freq: Once | INTRAMUSCULAR | Status: AC
  Administered 2020-02-02: 20 mg via INTRAVENOUS

## 2020-02-02 MED ORDER — KCL IN DEXTROSE-NACL 20-5-0.45 MEQ/L-%-% IV SOLN
INTRAVENOUS | Status: DC
  Filled 2020-02-02: qty 1000

## 2020-02-02 MED ORDER — HEPARIN SODIUM (PORCINE) 5000 UNIT/ML IJ SOLN
5000.0000 [IU] | INTRAMUSCULAR | Status: AC
  Administered 2020-02-02: 5000 [IU] via SUBCUTANEOUS
  Filled 2020-02-02: qty 1

## 2020-02-02 MED ORDER — ACETAMINOPHEN 500 MG PO TABS: 1000.0000 mg | ORAL_TABLET | Freq: Once | ORAL | Status: AC

## 2020-02-02 MED ORDER — KETOROLAC TROMETHAMINE 15 MG/ML IJ SOLN
INTRAMUSCULAR | Status: AC
  Filled 2020-02-02: qty 1

## 2020-02-02 MED ORDER — MIDAZOLAM HCL 2 MG/2ML IJ SOLN
INTRAMUSCULAR | Status: DC | PRN
  Administered 2020-02-02: 2 mg via INTRAVENOUS

## 2020-02-02 MED ORDER — OXYCODONE HCL 5 MG PO TABS: 5.0000 mg | ORAL_TABLET | ORAL | Status: DC | PRN

## 2020-02-02 MED ORDER — SUCCINYLCHOLINE CHLORIDE 200 MG/10ML IV SOSY
PREFILLED_SYRINGE | INTRAVENOUS | Status: AC
  Filled 2020-02-02: qty 10

## 2020-02-02 MED ORDER — KETAMINE HCL 10 MG/ML IJ SOLN
INTRAMUSCULAR | Status: DC | PRN
  Administered 2020-02-02: 30 mg via INTRAVENOUS

## 2020-02-02 MED ORDER — SUGAMMADEX SODIUM 200 MG/2ML IV SOLN
INTRAVENOUS | Status: DC | PRN
  Administered 2020-02-02: 200 mg via INTRAVENOUS

## 2020-02-02 MED ORDER — ROCURONIUM BROMIDE 10 MG/ML (PF) SYRINGE
PREFILLED_SYRINGE | INTRAVENOUS | Status: AC
  Filled 2020-02-02: qty 10

## 2020-02-02 MED ORDER — KETOROLAC TROMETHAMINE 30 MG/ML IJ SOLN
INTRAMUSCULAR | Status: AC
  Filled 2020-02-02: qty 1

## 2020-02-02 MED ORDER — OXYCODONE HCL 5 MG PO TABS: 5.0000 mg | ORAL_TABLET | Freq: Once | ORAL | Status: DC | PRN

## 2020-02-02 MED ORDER — FENTANYL CITRATE (PF) 100 MCG/2ML IJ SOLN
INTRAMUSCULAR | Status: AC
  Filled 2020-02-02: qty 2

## 2020-02-02 MED ORDER — KETOROLAC TROMETHAMINE 15 MG/ML IJ SOLN: 15.0000 mg | Freq: Once | INTRAMUSCULAR | Status: AC | PRN

## 2020-02-02 MED ORDER — SCOPOLAMINE 1 MG/3DAYS TD PT72: 1.0000 | MEDICATED_PATCH | TRANSDERMAL | Status: DC

## 2020-02-02 MED ORDER — SUCCINYLCHOLINE CHLORIDE 200 MG/10ML IV SOSY
PREFILLED_SYRINGE | INTRAVENOUS | Status: DC | PRN
  Administered 2020-02-02 (×2): 20 mg via INTRAVENOUS

## 2020-02-02 MED ORDER — ONDANSETRON HCL 4 MG PO TABS
4.0000 mg | ORAL_TABLET | Freq: Four times a day (QID) | ORAL | Status: DC | PRN
  Administered 2020-02-02: 4 mg via ORAL
  Filled 2020-02-02: qty 1

## 2020-02-02 MED ORDER — ONDANSETRON HCL 4 MG/2ML IJ SOLN: 4.0000 mg | Freq: Four times a day (QID) | INTRAMUSCULAR | Status: DC | PRN

## 2020-02-02 MED ORDER — GLYCOPYRROLATE PF 0.2 MG/ML IJ SOSY
PREFILLED_SYRINGE | INTRAMUSCULAR | Status: AC
  Filled 2020-02-02: qty 1

## 2020-02-02 MED ORDER — ACETAMINOPHEN 500 MG PO TABS
1000.0000 mg | ORAL_TABLET | ORAL | Status: AC
  Administered 2020-02-02: 1000 mg via ORAL
  Filled 2020-02-02: qty 2

## 2020-02-02 MED ORDER — ACETAMINOPHEN 325 MG PO TABS: 650.0000 mg | ORAL_TABLET | ORAL | Status: DC | PRN

## 2020-02-02 MED ORDER — MIDAZOLAM HCL 2 MG/2ML IJ SOLN: 0.5000 mg | Freq: Once | INTRAMUSCULAR | Status: DC | PRN

## 2020-02-02 MED ORDER — SCOPOLAMINE 1 MG/3DAYS TD PT72
1.0000 | MEDICATED_PATCH | TRANSDERMAL | Status: DC
  Administered 2020-02-02: 1.5 mg via TRANSDERMAL
  Filled 2020-02-02: qty 1

## 2020-02-02 MED ORDER — DEXAMETHASONE SODIUM PHOSPHATE 10 MG/ML IJ SOLN
INTRAMUSCULAR | Status: DC | PRN
  Administered 2020-02-02: 10 mg via INTRAVENOUS

## 2020-02-02 MED ORDER — KETAMINE HCL 10 MG/ML IJ SOLN
INTRAMUSCULAR | Status: AC
  Filled 2020-02-02: qty 1

## 2020-02-02 MED ORDER — ONDANSETRON HCL 4 MG/2ML IJ SOLN
INTRAMUSCULAR | Status: DC | PRN
  Administered 2020-02-02: 4 mg via INTRAVENOUS

## 2020-02-02 MED ORDER — SODIUM CHLORIDE 0.9% FLUSH: 3.0000 mL | Freq: Two times a day (BID) | INTRAVENOUS | Status: DC

## 2020-02-02 MED ORDER — PROPOFOL 10 MG/ML IV BOLUS
INTRAVENOUS | Status: AC
  Filled 2020-02-02: qty 20

## 2020-02-02 MED ORDER — GLYCOPYRROLATE 0.2 MG/ML IJ SOLN
INTRAMUSCULAR | Status: DC | PRN
  Administered 2020-02-02: .1 mg via INTRAVENOUS

## 2020-02-02 MED ORDER — TAMOXIFEN CITRATE 10 MG PO TABS
20.0000 mg | ORAL_TABLET | Freq: Every day | ORAL | Status: DC
  Administered 2020-02-03: 09:00:00 20 mg via ORAL
  Filled 2020-02-02: qty 2

## 2020-02-02 MED ORDER — CHLORHEXIDINE GLUCONATE 0.12 % MT SOLN
15.0000 mL | Freq: Once | OROMUCOSAL | Status: AC
  Administered 2020-02-02: 15 mL via OROMUCOSAL

## 2020-02-02 MED ORDER — MEPERIDINE HCL 50 MG/ML IJ SOLN: 6.2500 mg | INTRAMUSCULAR | Status: DC | PRN

## 2020-02-02 MED ORDER — LACTATED RINGERS IV SOLN: INTRAVENOUS | Status: DC

## 2020-02-02 MED ORDER — STERILE WATER FOR INJECTION IJ SOLN
INTRAMUSCULAR | Status: AC
  Filled 2020-02-02: qty 10

## 2020-02-02 MED ORDER — BUPIVACAINE HCL 0.25 % IJ SOLN
INTRAMUSCULAR | Status: DC | PRN
  Administered 2020-02-02: 20 mL

## 2020-02-02 MED ORDER — ACETAMINOPHEN 500 MG PO TABS
1000.0000 mg | ORAL_TABLET | Freq: Four times a day (QID) | ORAL | Status: DC
  Administered 2020-02-02 – 2020-02-03 (×4): 1000 mg via ORAL
  Filled 2020-02-02 (×4): qty 2

## 2020-02-02 MED ORDER — LACTATED RINGERS IV SOLN: INTRAVENOUS | Status: DC | PRN

## 2020-02-02 MED ORDER — ORAL CARE MOUTH RINSE: 15.0000 mL | Freq: Once | OROMUCOSAL | Status: AC

## 2020-02-02 MED ORDER — ROCURONIUM BROMIDE 10 MG/ML (PF) SYRINGE
PREFILLED_SYRINGE | INTRAVENOUS | Status: DC | PRN
  Administered 2020-02-02: 60 mg via INTRAVENOUS

## 2020-02-02 MED ORDER — LIDOCAINE HCL (PF) 2 % IJ SOLN
INTRAMUSCULAR | Status: AC
  Filled 2020-02-02: qty 5

## 2020-02-02 MED ORDER — EPHEDRINE SULFATE-NACL 50-0.9 MG/10ML-% IV SOSY
PREFILLED_SYRINGE | INTRAVENOUS | Status: DC | PRN
  Administered 2020-02-02: 10 mg via INTRAVENOUS

## 2020-02-02 MED ORDER — FENTANYL CITRATE (PF) 250 MCG/5ML IJ SOLN
INTRAMUSCULAR | Status: DC | PRN
  Administered 2020-02-02: 100 ug via INTRAVENOUS

## 2020-02-02 MED ORDER — IBUPROFEN 400 MG PO TABS
600.0000 mg | ORAL_TABLET | Freq: Four times a day (QID) | ORAL | Status: DC | PRN
  Administered 2020-02-03 (×2): 600 mg via ORAL
  Filled 2020-02-02 (×2): qty 1

## 2020-02-02 MED ORDER — LACTATED RINGERS IR SOLN
Status: DC | PRN
  Administered 2020-02-02: 1000 mL

## 2020-02-02 MED ORDER — BUPIVACAINE HCL 0.25 % IJ SOLN
INTRAMUSCULAR | Status: AC
  Filled 2020-02-02: qty 1

## 2020-02-02 MED ORDER — GABAPENTIN 300 MG PO CAPS
300.0000 mg | ORAL_CAPSULE | Freq: Every day | ORAL | Status: DC
  Administered 2020-02-02: 20:00:00 300 mg via ORAL
  Filled 2020-02-02: qty 1

## 2020-02-02 MED ORDER — VENLAFAXINE HCL ER 150 MG PO CP24
150.0000 mg | ORAL_CAPSULE | Freq: Every day | ORAL | Status: DC
  Administered 2020-02-03: 150 mg via ORAL
  Filled 2020-02-02: qty 1

## 2020-02-02 MED ORDER — LIDOCAINE 2% (20 MG/ML) 5 ML SYRINGE
INTRAMUSCULAR | Status: DC | PRN
  Administered 2020-02-02: 70 mg via INTRAVENOUS

## 2020-02-02 MED ORDER — SODIUM CHLORIDE 0.9 % IV SOLN: 250.0000 mL | INTRAVENOUS | Status: DC | PRN

## 2020-02-02 MED ORDER — MIDAZOLAM HCL 2 MG/2ML IJ SOLN
INTRAMUSCULAR | Status: AC
  Filled 2020-02-02: qty 2

## 2020-02-02 SURGICAL SUPPLY — 65 items
APPLICATOR SURGIFLO ENDO (HEMOSTASIS) IMPLANT
BACTOSHIELD CHG 4% 4OZ (MISCELLANEOUS) ×1
BAG LAPAROSCOPIC 12 15 PORT 16 (BASKET) IMPLANT
BAG RETRIEVAL 12/15 (BASKET)
BAG SPECIMEN  6X9 BHZR ZP PCKT (MISCELLANEOUS) ×1
BAG SPECIMEN 6X9 BHZR ZP PCKT (MISCELLANEOUS) ×1 IMPLANT
BLADE SURG SZ10 CARB STEEL (BLADE) IMPLANT
COVER BACK TABLE 60X90IN (DRAPES) ×2 IMPLANT
COVER TIP SHEARS 8 DVNC (MISCELLANEOUS) ×1 IMPLANT
COVER TIP SHEARS 8MM DA VINCI (MISCELLANEOUS) ×1
COVER WAND RF STERILE (DRAPES) IMPLANT
DECANTER SPIKE VIAL GLASS SM (MISCELLANEOUS) IMPLANT
DERMABOND ADVANCED (GAUZE/BANDAGES/DRESSINGS) ×1
DERMABOND ADVANCED .7 DNX12 (GAUZE/BANDAGES/DRESSINGS) ×1 IMPLANT
DRAPE ARM DVNC X/XI (DISPOSABLE) ×4 IMPLANT
DRAPE COLUMN DVNC XI (DISPOSABLE) ×1 IMPLANT
DRAPE DA VINCI XI ARM (DISPOSABLE) ×4
DRAPE DA VINCI XI COLUMN (DISPOSABLE) ×1
DRAPE SHEET LG 3/4 BI-LAMINATE (DRAPES) ×2 IMPLANT
DRAPE SURG IRRIG POUCH 19X23 (DRAPES) ×2 IMPLANT
DRSG OPSITE POSTOP 4X6 (GAUZE/BANDAGES/DRESSINGS) IMPLANT
DRSG OPSITE POSTOP 4X8 (GAUZE/BANDAGES/DRESSINGS) IMPLANT
ELECT REM PT RETURN 15FT ADLT (MISCELLANEOUS) ×2 IMPLANT
GLOVE BIO SURGEON STRL SZ 6 (GLOVE) ×8 IMPLANT
GLOVE BIO SURGEON STRL SZ 6.5 (GLOVE) ×4 IMPLANT
GOWN STRL REUS W/ TWL LRG LVL3 (GOWN DISPOSABLE) ×4 IMPLANT
GOWN STRL REUS W/TWL LRG LVL3 (GOWN DISPOSABLE) ×4
HOLDER FOLEY CATH W/STRAP (MISCELLANEOUS) ×2 IMPLANT
IRRIG SUCT STRYKERFLOW 2 WTIP (MISCELLANEOUS) ×2
IRRIGATION SUCT STRKRFLW 2 WTP (MISCELLANEOUS) ×1 IMPLANT
KIT PROCEDURE DA VINCI SI (MISCELLANEOUS)
KIT PROCEDURE DVNC SI (MISCELLANEOUS) IMPLANT
KIT TURNOVER KIT A (KITS) IMPLANT
MANIPULATOR UTERINE 4.5 ZUMI (MISCELLANEOUS) ×2 IMPLANT
NEEDLE HYPO 21X1.5 SAFETY (NEEDLE) ×2 IMPLANT
NEEDLE SPNL 18GX3.5 QUINCKE PK (NEEDLE) IMPLANT
OBTURATOR OPTICAL STANDARD 8MM (TROCAR) ×1
OBTURATOR OPTICAL STND 8 DVNC (TROCAR) ×1
OBTURATOR OPTICALSTD 8 DVNC (TROCAR) ×1 IMPLANT
PACK ROBOT GYN CUSTOM WL (TRAY / TRAY PROCEDURE) ×2 IMPLANT
PAD POSITIONING PINK XL (MISCELLANEOUS) ×2 IMPLANT
PENCIL SMOKE EVACUATOR (MISCELLANEOUS) IMPLANT
PORT ACCESS TROCAR AIRSEAL 12 (TROCAR) ×1 IMPLANT
PORT ACCESS TROCAR AIRSEAL 5M (TROCAR) ×1
POUCH SPECIMEN RETRIEVAL 10MM (ENDOMECHANICALS) IMPLANT
SCRUB CHG 4% DYNA-HEX 4OZ (MISCELLANEOUS) ×1 IMPLANT
SEAL CANN UNIV 5-8 DVNC XI (MISCELLANEOUS) ×4 IMPLANT
SEAL XI 5MM-8MM UNIVERSAL (MISCELLANEOUS) ×4
SET TRI-LUMEN FLTR TB AIRSEAL (TUBING) ×2 IMPLANT
SPONGE LAP 18X18 RF (DISPOSABLE) IMPLANT
SURGIFLO W/THROMBIN 8M KIT (HEMOSTASIS) IMPLANT
SUT MNCRL AB 4-0 PS2 18 (SUTURE) IMPLANT
SUT PDS AB 1 TP1 96 (SUTURE) IMPLANT
SUT VIC AB 0 CT1 27 (SUTURE)
SUT VIC AB 0 CT1 27XBRD ANTBC (SUTURE) IMPLANT
SUT VIC AB 2-0 CT1 27 (SUTURE)
SUT VIC AB 2-0 CT1 TAPERPNT 27 (SUTURE) IMPLANT
SUT VIC AB 4-0 PS2 18 (SUTURE) ×4 IMPLANT
SYR 10ML LL (SYRINGE) IMPLANT
TOWEL OR NON WOVEN STRL DISP B (DISPOSABLE) ×2 IMPLANT
TRAP SPECIMEN MUCUS 40CC (MISCELLANEOUS) IMPLANT
TRAY FOLEY MTR SLVR 16FR STAT (SET/KITS/TRAYS/PACK) ×2 IMPLANT
TROCAR XCEL NON-BLD 5MMX100MML (ENDOMECHANICALS) IMPLANT
UNDERPAD 30X36 HEAVY ABSORB (UNDERPADS AND DIAPERS) ×2 IMPLANT
WATER STERILE IRR 1000ML POUR (IV SOLUTION) ×2 IMPLANT

## 2020-02-02 NOTE — Anesthesia Procedure Notes (Signed)
Procedure Name: Intubation Date/Time: 02/02/2020 7:33 AM Performed by: Michele Rockers, CRNA Pre-anesthesia Checklist: Patient identified, Patient being monitored, Timeout performed, Emergency Drugs available and Suction available Patient Re-evaluated:Patient Re-evaluated prior to induction Oxygen Delivery Method: Circle system utilized Preoxygenation: Pre-oxygenation with 100% oxygen Induction Type: IV induction Ventilation: Mask ventilation without difficulty Laryngoscope Size: Miller and 2 Grade View: Grade I Tube type: Oral Tube size: 7.0 mm Number of attempts: 1 Airway Equipment and Method: Stylet Placement Confirmation: ETT inserted through vocal cords under direct vision,  positive ETCO2 and breath sounds checked- equal and bilateral Secured at: 21 cm Tube secured with: Tape Dental Injury: Teeth and Oropharynx as per pre-operative assessment

## 2020-02-02 NOTE — Transfer of Care (Signed)
Immediate Anesthesia Transfer of Care Note  Patient: Virginia [name]  Procedure(s) Performed: XI ROBOTIC ASSISTED SALPINGO OOPHORECTOMY (Bilateral )  Patient Location: PACU  Anesthesia Type:General  Level of Consciousness: awake, patient cooperative and responds to stimulation  Airway & Oxygen Therapy: Patient Spontanous Breathing and non-rebreather face mask  Post-op Assessment: Report given to RN, Post -op Vital signs reviewed and stable and Patient moving all extremities X 4  Post vital signs: Reviewed and stable  Last Vitals:  Vitals Value Taken Time  BP 119/70 02/02/20 0915  Temp    Pulse 90 02/02/20 0925  Resp 18 02/02/20 0925  SpO2 98 % 02/02/20 0925  Vitals shown include unvalidated device data.  Last Pain:  Vitals:   02/02/20 0608  TempSrc:   PainSc: 0-No pain      Patients Stated Pain Goal: 4 (86/28/24 1753)  Complications: No complications documented.

## 2020-02-02 NOTE — Progress Notes (Signed)
Ambulated with patient in PACU. O2 sats prior to ambulating was 89-93% on 2L. During walk, patient O2 sats dropped to low 80s high 70s on 2L and HR went to the 140s. Patient denied SOB or dizziness. Patient took a rest break and ambulated again with the same result. Patient now resting in chair with O2 sats 95% on 2L HR 90s. Notified Dr. Glennon Mac. Orders to give another 20mg  of lasix. Possibly to admit overnight for observation.

## 2020-02-02 NOTE — Anesthesia Postprocedure Evaluation (Signed)
Anesthesia Post Note  Patient: KARNISHA LEFEBRE  Procedure(s) Performed: XI ROBOTIC ASSISTED SALPINGO OOPHORECTOMY (Bilateral )     Patient location during evaluation: PACU Anesthesia Type: General Level of consciousness: awake and alert, patient cooperative and oriented Pain management: pain level controlled Vital Signs Assessment: post-procedure vital signs reviewed and stable Respiratory status: spontaneous breathing, nonlabored ventilation, respiratory function stable and patient connected to nasal cannula oxygen Cardiovascular status: blood pressure returned to baseline and stable Postop Assessment: no apparent nausea or vomiting, able to ambulate and adequate PO intake Anesthetic complications: yes (laryngospasm with neg pressure pulm edema) Comments: Pt recovering well from negative pressure pulmonary edema after laryngospasm on extubation. Treated with lasix, no with O2 sat upper 90's on 2l NCO2, drops to 85-93 on RA. Pt is able to ambulate without dyspnea, but O2 sat does drop into 80's with HR 110's during ambulation. Discussed with patient, pt's husband, Dr. Berline Lopes, and all agree for pt to be admitted for overnight observation/care, anticipating total resolution of the Neg pressure pulm edema by the am        Last Vitals:  Vitals:   02/02/20 1545 02/02/20 1600  BP: 108/82 112/81  Pulse: 91 90  Resp: 10 19  Temp:    SpO2: 94% 96%    Last Pain:  Vitals:   02/02/20 1600  TempSrc:   PainSc: 0-No pain                 Tatiyanna Lashley,E. Virgil Lightner

## 2020-02-02 NOTE — Op Note (Signed)
OPERATIVE NOTE  Pre-operative Diagnosis: ER+ breast cancer  Post-operative Diagnosis: same  Operation: Robotic-assisted laparoscopic bilateral salpingoophorectomy  Surgeon: Jeral Pinch MD  Assistant Surgeon: Lahoma Crocker MD (an MD assistant was necessary for tissue manipulation, management of robotic instrumentation, retraction and positioning due to the complexity of the case and hospital policies).   Anesthesia: GET  Urine Output: 300cc  Operative Findings: On EUA, 8-10cm mobile uterus, no adnexal masses. On intra-abdominal entry, normal upper abdominal survey. Normal small and large bowel. Uterus 10cm and bulbus at fundus. Bilateral adnexa normal appearing.   Estimated Blood Loss:  15cc      Total IV Fluids: see I&O flowsheet         Specimens: bilateral tubes and ovaries         Complications:  None apparent; patient tolerated the procedure well.         Disposition: PACU - hemodynamically stable.  Procedure Details  The patient was seen in the Holding Room. The risks, benefits, complications, treatment options, and expected outcomes were discussed with the patient.  The patient concurred with the proposed plan, giving informed consent.  The site of surgery properly noted/marked. The patient was identified as Virginia [name] and the procedure verified as a Robotic-assisted bilateral salpingo oophorectomy.   After induction of anesthesia, the patient was draped and prepped in the usual sterile manner. Patient was placed in supine position after anesthesia and draped and prepped in the usual sterile manner as follows: Her arms were tucked to her side with all appropriate precautions.  The shoulders were stabilized with padded shoulder blocks applied to the acromium processes.  The patient was placed in the semi-lithotomy position in [place].  The perineum and vagina were prepped with CholoraPrep. The patient was draped after the CholoraPrep had been allowed to dry  for 3 minutes.  A Time Out was held and the above information confirmed.  The urethra was prepped with Betadine. Foley catheter was placed.  A sterile speculum was placed in the vagina.  The cervix was grasped with a single-tooth tenaculum. The cervix was dilated with Kennon Rounds dilators.  The ZUMI uterine manipulator with a medium colpotomizer ring was placed without difficulty.  A pneum occluder balloon was placed over the manipulator.  OG tube placement was confirmed and to suction.   Next, a 10 mm skin incision was made 1 cm below the subcostal margin in the midclavicular line.  The 5 mm Optiview port and scope was used for direct entry.  Opening pressure was under 10 mm CO2.  The abdomen was insufflated and the findings were noted as above.   At this point and all points during the procedure, the patient's intra-abdominal pressure did not exceed 15 mmHg. Next, an 8 mm skin incision was made superior to the umbilicus and a right and left port were placed about 8 cm lateral to the robot port on the right and left side.  The 5 mm assist trocar was exchanged for a 10-12 mm port. All ports were placed under direct visualization.  The patient was placed in steep Trendelenburg.  Bowel was already in the upper abdomen.  The robot was docked in the normal manner.  The right and left peritoneum were opened parallel to the IP ligament to open the retroperitoneal spaces bilaterally. The ureter was noted to be on the medial leaf of the broad ligament.  The peritoneum above the ureter was incised and stretched and the infundibulopelvic ligament was skeletonized, cauterized and cut.  The mesosalpinx was cauterized to the uterine cornua. The utero-ovarian ligament and fallopian tube were cauterized and transected, freeing the adnexa bilaterally.  The pelvis was irrigated and hemostasis assured. The adnexa were placed in an Endocatch bag and removed through the assist trocar.  At this point in the procedure was completed.   Robotic instruments were removed under direct visulaization.  The robot was undocked. The fascia at the 10-12 mm port was closed with 0 Vicryl on a UR-5 needle.  The subcuticular tissue was closed with 4-0 Vicryl and the skin was closed with 4-0 Monocryl in a subcuticular manner.  Dermabond was applied.    The manipulator and KOH ring were removed. The foley catheter was removed. The vagina was swabbed with minimal bleeding noted.   All sponge, lap and needle counts were correct x  3.   The patient was transferred to the recovery room in stable condition.  Jeral Pinch, MD

## 2020-02-02 NOTE — Interval H&P Note (Signed)
History and Physical Interval Note:  02/02/2020 6:57 AM  Virginia [name]  has presented today for surgery, with the diagnosis of ESTROGEN RECEPTOR POSITIVE BREAST CANCER.  The various methods of treatment have been discussed with the patient and family. After consideration of risks, benefits and other options for treatment, the patient has consented to  Procedure(s): XI ROBOTIC ASSISTED SALPINGO OOPHORECTOMY (Bilateral) as a surgical intervention.  The patient's history has been reviewed, patient examined, no change in status, stable for surgery.  I have reviewed the patient's chart and labs.  Questions were answered to the patient's satisfaction.     Lafonda Mosses   History of Present Illness:  Virginia [name] is a 39 y.o. y.o. female who presents for therapeutic BSO in the setting of ER+ breast cancer.  PAST MEDICAL HISTORY:  Past Medical History:  Diagnosis Date   Anemia    as a child   Anxiety    Cholelithiasis    with pregnancy   Elevated liver enzymes    during pregnancy March 2015   Family history of breast cancer    Family history of prostate cancer    GERD (gastroesophageal reflux disease)    one episode with pregnancy   History of chemotherapy    History of neuropathy    with chemo   History of radiation therapy 06/11/16- 07/23/16   Left Breast 50 Gy in 25 fractions, Left Breast boost 10 Gy in 5 fractions.    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 ([place]) 01/16/2016   Migraine    PONV (postoperative nausea and vomiting)      PAST SURGICAL HISTORY:  Past Surgical History:  Procedure Laterality Date   BREAST LUMPECTOMY WITH RADIOACTIVE SEED AND SENTINEL LYMPH NODE BIOPSY Left 03/13/2016   Procedure: LEFT BREAST LUMPECTOMY WITH RADIOACTIVE SEED X 2 AND SENTINEL LYMPH NODE BIOPSY;  Surgeon: Stark Klein, MD;  Location: [facility];  Service: General;   Laterality: Left;   NO PAST SURGERIES     PORT-A-CATH REMOVAL N/A 04/16/2017   Procedure: REMOVAL PORT-A-CATH;  Surgeon: Stark Klein, MD;  Location: [facility];  Service: General;  Laterality: N/A;   PORTACATH PLACEMENT Right 03/13/2016   Procedure: INSERTION PORT-A-CATH;  Surgeon: Stark Klein, MD;  Location: [facility];  Service: General;  Laterality: Right;    OB/GYN HISTORY:  OB History  Gravida Para Term Preterm AB Living  2 2 2     2   SAB IAB Ectopic Multiple Live Births          2    # Outcome Date GA Lbr Len/2nd Weight Sex Delivery Anes PTL Lv  2 Term 05/20/13 [redacted]w[redacted]d 05:30 / 01:21 6 lb 14.6 oz (3.135 kg) F Vag-Spont EPI  LIV  1 Term 2005 [redacted]w[redacted]d  7 lb 2 oz (3.232 kg) M Vag-Spont   LIV    No LMP recorded. (Menstrual status: Other).  MEDICATIONS: No current facility-administered medications on file prior to encounter.   Current Outpatient Medications on File Prior to Encounter  Medication Sig Dispense Refill   acetaminophen (TYLENOL) 325 MG tablet Take 650 mg by mouth every 6 (six) hours as needed for moderate pain or headache.      amLODipine (NORVASC) 5 MG tablet Take 5 mg by mouth daily.     Biotin w/ Vitamins C & E (HAIR/SKIN/NAILS PO) Take 2 tablets by  mouth at bedtime.     docusate sodium (COLACE) 100 MG capsule Take 100 mg by mouth daily as needed for mild constipation.     gabapentin (NEURONTIN) 300 MG capsule TAKE 1 CAPSULE BY MOUTH DAILY AT BEDTIME (Patient taking differently: Take 300 mg by mouth at bedtime as needed (pain).) 90 capsule 3   goserelin (ZOLADEX) 10.8 MG injection Inject 10.8 mg into the skin every 3 (three) months.      NONFORMULARY OR COMPOUNDED ITEM Vitamin E vaginal suppositories 200u/ml.  One pv three times weekly. (Patient taking differently: Place 1 suppository vaginally 3 (three) times a week. Vitamin E vaginal suppositories 200u/ml.) 36 each 3   Omega-3 Fatty Acids (FISH OIL PO) Take 2 capsules by mouth daily.      b complex vitamins tablet Take 1 tablet  by mouth daily. (Patient not taking: Reported on 01/12/2020)     ibuprofen (ADVIL,MOTRIN) 200 MG tablet Take 400 mg by mouth every 8 (eight) hours as needed for mild pain.  (Patient not taking: Reported on 01/12/2020)     lidocaine-prilocaine (EMLA) cream Apply 1 application topically as needed. (Patient not taking: Reported on 01/12/2020) 30 g 2   MAGNESIUM PO Take 300 mg by mouth at bedtime.     Menthol, Topical Analgesic, (BIOFREEZE EX) Apply 1 application topically daily as needed (pain).     Multiple Vitamins-Minerals (MULTIVITAMIN GUMMIES ADULT PO) Take 2 tablets by mouth daily.     [DISCONTINUED] prochlorperazine (COMPAZINE) 10 MG tablet Take 1 tablet (10 mg total) by mouth every 6 (six) hours as needed (Nausea or vomiting). (Patient not taking: Reported on 06/03/2016) 30 tablet 1     ALLERGIES:  Allergies  Allergen Reactions   Amoxicillin Diarrhea and Nausea Only    Has patient had a PCN reaction causing immediate rash, facial/tongue/throat swelling, SOB or lightheadedness with hypotension: No Has patient had a PCN reaction causing severe rash involving mucus membranes or skin necrosis: No Has patient had a PCN reaction that required hospitalization No Has patient had a PCN reaction occurring within the last 10 years: Yes If all of the above answers are "NO", then may proceed with Cephalosporin use.      FAMILY HISTORY:  Family History  Problem Relation Age of Onset   Hypertension Mother    Hypertension Father    Prostate cancer Father    Liver cancer Maternal Grandmother    Breast cancer Paternal Grandmother        dx in her 42s   Lung cancer Paternal Aunt        smoker   Heart attack Paternal Grandfather    Liver cancer Other      SOCIAL HISTORY:  Social Connections: Not on file   Physical Exam:  Vital Signs for this encounter:  Blood pressure (!) 147/92, pulse 79, temperature 98.2 F (36.8 C), temperature source Oral, resp. rate 16, height 5\' 7"  (1.702 m), weight  187 lb (84.8 kg), SpO2 98 %. Body mass index is 29.29 kg/m. General: Alert, oriented, no acute distress.  HEENT: Normocephalic, atraumatic. Sclera anicteric.   LABORATORY AND RADIOLOGIC DATA:   Lab Results  Component Value Date   WBC 4.4 01/17/2020   HGB 12.8 01/17/2020   HCT 39.6 01/17/2020   PLT 188 01/17/2020   GLUCOSE 90 01/17/2020   ALT 15 01/17/2020   AST 18 01/17/2020   NA 141 01/17/2020   K 4.2 01/17/2020   CL 105 01/17/2020   CREATININE 0.91 01/17/2020   BUN 10  01/17/2020   CO2 26 01/17/2020   TSH 1.597 04/28/2013

## 2020-02-03 ENCOUNTER — Encounter (HOSPITAL_COMMUNITY): Payer: Self-pay | Admitting: Gynecologic Oncology

## 2020-02-03 DIAGNOSIS — C50212 Malignant neoplasm of upper-inner quadrant of left female breast: Secondary | ICD-10-CM | POA: Diagnosis not present

## 2020-02-03 LAB — BASIC METABOLIC PANEL
Anion gap: 9 (ref 5–15)
BUN: 8 mg/dL (ref 6–20)
CO2: 29 mmol/L (ref 22–32)
Calcium: 8.8 mg/dL — ABNORMAL LOW (ref 8.9–10.3)
Chloride: 101 mmol/L (ref 98–111)
Creatinine, Ser: 0.75 mg/dL (ref 0.44–1.00)
GFR, Estimated: >60 mL/min (ref >60)
Glucose, Bld: 113 mg/dL — ABNORMAL HIGH (ref 70–99)
Potassium: 4.2 mmol/L (ref 3.5–5.1)
Sodium: 139 mmol/L (ref 135–145)

## 2020-02-03 LAB — CBC
HCT: 38.2 % (ref 36.0–46.0)
Hemoglobin: 11.9 g/dL — ABNORMAL LOW (ref 12.0–15.0)
MCH: 27.3 pg (ref 26.0–34.0)
MCHC: 31.2 g/dL (ref 30.0–36.0)
MCV: 87.6 fL (ref 80.0–100.0)
Platelets: 172 10*3/uL (ref 150–400)
RBC: 4.36 MIL/uL (ref 3.87–5.11)
RDW: 13.1 % (ref 11.5–15.5)
WBC: 8 10*3/uL (ref 4.0–10.5)
nRBC: 0 % (ref 0.0–0.2)

## 2020-02-03 LAB — SURGICAL PATHOLOGY

## 2020-02-03 NOTE — Discharge Instructions (Signed)
Return to work: 1 - 2 weeks.  Activity: 1. Be up and out of the bed during the day.  Take a nap if needed.  You may walk up steps but be careful and use the hand rail.  Stair climbing will tire you more than you think, you may need to stop part way and rest.   2. No lifting or straining for 2 weeks.  3. Do Not drive if you are taking narcotic pain medicine.  4. Shower daily.  Use soap and water on your incision and pat dry; don't rub.    Bilateral Salpingo-Oophorectomy, Care After This sheet gives you information about how to care for yourself after your procedure. Your health care provider may also give you more specific instructions. If you have problems or questions, contact your health care provider. What can I expect after the procedure? After the procedure, it is common to have:  Abdominal pain.  Some occasional vaginal bleeding (spotting).  Tiredness.  Symptoms of menopause, such as hot flashes, night sweats, or mood swings. Follow these instructions at home: Incision care   Keep your incision area and your bandage (dressing) clean and dry.  Follow instructions from your health care provider about how to take care of your incision. Make sure you: ? Wash your hands with soap and water before you change your dressing. If soap and water are not available, use hand sanitizer. ? Change your dressing as told by your health care provider. ? Leave stitches (sutures), staples, skin glue, or adhesive strips in place. These skin closures may need to stay in place for 2 weeks or longer. If adhesive strip edges start to loosen and curl up, you may trim the loose edges. Do not remove adhesive strips completely unless your health care provider tells you to do that.  Check your incision area every day for signs of infection. Check for: ? Redness, swelling, or pain. ? Fluid or blood. ? Warmth. ? Pus or a bad smell. Activity   Do not drive or use heavy machinery while taking  prescription pain medicine.  Do not drive for 24 hours if you received a medicine to help you relax (sedative) during your procedure.  Take frequent, short walks throughout the day. Rest when you get tired. Ask your health care provider what activities are safe for you.  Avoid activity that requires great effort. Also, avoid heavy lifting. Do not lift anything that is heavier than 10 lbs. (4.5 kg), or the limit that your health care provider tells you, until he or she says that it is safe to do so.  Do not douche, use tampons, or have sex until your health care provider approves. General instructions   To prevent or treat constipation while you are taking prescription pain medicine, your health care provider may recommend that you: ? Drink enough fluid to keep your urine clear or pale yellow. ? Take over-the-counter or prescription medicines. ? Eat foods that are high in fiber, such as fresh fruits and vegetables, whole grains, and beans. ? Limit foods that are high in fat and processed sugars, such as fried and sweet foods.  Take over-the-counter and prescription medicines only as told by your health care provider.  Do not take baths, swim, or use a hot tub until your health care provider approves. Ask your health care provider if you can take showers. You may only be allowed to take sponge baths for bathing.  Wear compression stockings as told by your health care  provider. These stockings help to prevent blood clots and reduce swelling in your legs.  Keep all follow-up visits as told by your health care provider. This is important. Contact a health care provider if:  You have pain when you urinate.  You have pus or a bad smelling discharge coming from your vagina.  You have redness, swelling, or pain around your incision.  You have fluid or blood coming from your incision.  Your incision feels warm to the touch.  You have pus or a bad smell coming from your incision.  You  have a fever.  Your incision starts to break open.  You have pain in the abdomen, and it gets worse or does not get better when you take medicine.  You develop a rash.  You develop nausea and vomiting.  You feel lightheaded. Get help right away if:  You develop pain in your chest or leg.  You become short of breath.  You faint.  You have increased bleeding from your vagina. Summary  After the procedure, it is common to have pain, bleeding in the vagina, and symptoms of menopause.  Follow instructions from your health care provider about how to take care of your incision.  Follow instructions from your health care provider about activities and restrictions.  Check your incision every day for signs of infection and report any symptoms to your health care provider. This information is not intended to replace advice given to you by your health care provider. Make sure you discuss any questions you have with your health care provider. Document Revised: 04/16/2018 Document Reviewed: 03/17/2016 Elsevier Patient Education  [place].   Medications:  - Take ibuprofen and tylenol first line for pain control. Take these regularly (every 6 hours) to decrease the build up of pain.  - If necessary, for severe pain not relieved by ibuprofen, contact Dr Charisse March office and you will be prescribed oxycodone.  - While taking oxycodone you should take sennakot every night to reduce the likelihood of constipation. If this causes diarrhea, stop its use.  Diet: 1. Low sodium Heart Healthy Diet is recommended.  2. It is safe to use a laxative if you have difficulty moving your bowels.   Wound Care: 1. Keep clean and dry.  Shower daily.  Reasons to call the Doctor:   Fever - Oral temperature greater than 100.4 degrees Fahrenheit  Foul-smelling vaginal discharge  Difficulty urinating  Nausea and vomiting  Increased pain at the site of the incision that is unrelieved with  pain medicine.  Difficulty breathing with or without chest pain  New calf pain especially if only on one side  Sudden, continuing increased vaginal bleeding with or without clots.   Follow-up: 1. See Wyvonnia Lora in 4 weeks.  Contacts: For questions or concerns you should contact:  Dr. Wyvonnia Lora  at [phone]  After hours and on week-ends call [phone] and ask to speak to the physician on call for Gynecologic Oncology      General Anesthesia, Adult, Care After This sheet gives you information about how to care for yourself after your procedure. Your health care provider may also give you more specific instructions. If you have problems or questions, contact your health care provider. What can I expect after the procedure? After the procedure, the following side effects are common:  Pain or discomfort at the IV site.  Nausea.  Vomiting.  Sore throat.  Trouble concentrating.  Feeling cold or chills.  Weak or  tired.  Sleepiness and fatigue.  Soreness and body aches. These side effects can affect parts of the body that were not involved in surgery. Follow these instructions at home:  For at least 24 hours after the procedure:  Have a responsible adult stay with you. It is important to have someone help care for you until you are awake and alert.  Rest as needed.  Do not: ? Participate in activities in which you could fall or become injured. ? Drive. ? Use heavy machinery. ? Drink alcohol. ? Take sleeping pills or medicines that cause drowsiness. ? Make important decisions or sign legal documents. ? Take care of children on your own. Eating and drinking  Follow any instructions from your health care provider about eating or drinking restrictions.  When you feel hungry, start by eating small amounts of foods that are soft and easy to digest (bland), such as toast. Gradually return to your regular diet.  Drink enough fluid to keep your urine  pale yellow.  If you vomit, rehydrate by drinking water, juice, or clear broth. General instructions  If you have sleep apnea, surgery and certain medicines can increase your risk for breathing problems. Follow instructions from your health care provider about wearing your sleep device: ? Anytime you are sleeping, including during daytime naps. ? While taking prescription pain medicines, sleeping medicines, or medicines that make you drowsy.  Return to your normal activities as told by your health care provider. Ask your health care provider what activities are safe for you.  Take over-the-counter and prescription medicines only as told by your health care provider.  If you smoke, do not smoke without supervision.  Keep all follow-up visits as told by your health care provider. This is important. Contact a health care provider if:  You have nausea or vomiting that does not get better with medicine.  You cannot eat or drink without vomiting.  You have pain that does not get better with medicine.  You are unable to pass urine.  You develop a skin rash.  You have a fever.  You have redness around your IV site that gets worse. Get help right away if:  You have difficulty breathing.  You have chest pain.  You have blood in your urine or stool, or you vomit blood. Summary  After the procedure, it is common to have a sore throat or nausea. It is also common to feel tired.  Have a responsible adult stay with you for the first 24 hours after general anesthesia. It is important to have someone help care for you until you are awake and alert.  When you feel hungry, start by eating small amounts of foods that are soft and easy to digest (bland), such as toast. Gradually return to your regular diet.  Drink enough fluid to keep your urine pale yellow.  Return to your normal activities as told by your health care provider. Ask your health care provider what activities are safe for  you. This information is not intended to replace advice given to you by your health care provider. Make sure you discuss any questions you have with your health care provider. Document Revised: 02/13/2017 Document Reviewed: 09/26/2016 Elsevier Patient Education  [place].

## 2020-02-03 NOTE — Progress Notes (Signed)
Pt alert and oriented, tolerating diet. D/C instructions given, pt d/cd home. 

## 2020-02-03 NOTE — Progress Notes (Signed)
1 Day Post-Op Procedure(s) (LRB): XI ROBOTIC ASSISTED SALPINGO OOPHORECTOMY (Bilateral)  Subjective: Patient reports feeling better this morning.  She has been off oxygen since 5 AM.  She was saturating in the 70s at 1 point when they checked her oxygen level, but very quickly came back up to almost 100 with some deeper breathing.  She continues to cough up blood-tinged phlegm, but this has deep creased significantly since yesterday.  She is ambulated to the bathroom and last night felt like her breathing was somewhat difficult with this, but has ambulated with much less difficulty this morning.  She is voiding freely.  She endorses flatus.  She has been tolerating some fluid intake, has not eaten.  Reports some abdominal pain and feeling sore.  Objective: Vital signs in last 24 hours: Temp:  [97.5 F (36.4 C)-98.2 F (36.8 C)] 97.7 F (36.5 C) (12/10 0622) Pulse Rate:  [84-125] 89 (12/10 0622) Resp:  [10-34] 18 (12/10 0622) BP: (96-130)/(60-91) 104/60 (12/10 0622) SpO2:  [76 %-100 %] 91 % (12/10 0622)    Intake/Output from previous day: 12/09 0701 - 12/10 0700 In: 2420 [P.O.:1320; I.V.:1100] Out: 3015 [Urine:2850; Blood:15]  Physical Examination: General: No acute distress, alert and oriented Pulmonary: Mild expiratory wheeze at end of breath, otherwise lungs clear to auscultation bilaterally.  No rales. Cardiovascular: Regular rate and rhythm, no murmurs rubs or gallops appreciated. Abdomen: Appropriately tender, soft, nondistended.  4 incisions clean dry and intact with Dermabond in place. Extremities: Warm and well perfused, no edema.  Labs: CBC    Component Value Date/Time   WBC 8.0 02/03/2020 0530   RBC 4.36 02/03/2020 0530   HGB 11.9 (L) 02/03/2020 0530   HGB 12.7 07/31/2017 1349   HGB 12.4 02/27/2017 1350   HCT 38.2 02/03/2020 0530   HCT 38.9 02/27/2017 1350   PLT 172 02/03/2020 0530   PLT 182 07/31/2017 1349   PLT 156 02/27/2017 1350   MCV 87.6 02/03/2020 0530    MCV 85.1 02/27/2017 1350   MCH 27.3 02/03/2020 0530   MCHC 31.2 02/03/2020 0530   RDW 13.1 02/03/2020 0530   RDW 13.2 02/27/2017 1350   LYMPHSABS 2.1 01/17/2020 0916   LYMPHSABS 1.4 02/27/2017 1350   MONOABS 0.6 01/17/2020 0916   MONOABS 0.5 02/27/2017 1350   EOSABS 0.1 01/17/2020 0916   EOSABS 0.1 02/27/2017 1350   BASOSABS 0.1 01/17/2020 0916   BASOSABS 0.0 02/27/2017 1350   BMP Latest Ref Rng & Units 02/03/2020 01/17/2020 06/22/2019  Glucose 70 - 99 mg/dL 113(H) 90 88  BUN 6 - 20 mg/dL 8 10 8   Creatinine 0.44 - 1.00 mg/dL 0.75 0.91 0.79  Sodium 135 - 145 mmol/L 139 141 140  Potassium 3.5 - 5.1 mmol/L 4.2 4.2 4.1  Chloride 98 - 111 mmol/L 101 105 105  CO2 22 - 32 mmol/L 29 26 29   Calcium 8.9 - 10.3 mg/dL 8.8(L) 9.2 9.0    Assessment:  39 y.o. s/p Procedure(s): XI ROBOTIC ASSISTED SALPINGO OOPHORECTOMY: Uncomplicated from a surgical standpoint however had a laryngeal spasm with negative pressure pulmonary edema when she was extubated.  Was kept overnight for observation.  Pain: Pain well controlled with oral pain medications.  NPPE: Unlabored breathing now off of oxygen.  Some expiratory wheezing and signs on physical exam consistent with persistent mild pulmonary edema.  Breathing much improved from yesterday afternoon when I saw her.  I do not think that the patient needs any further Lasix.  She has had excellent urine output and  breathing improved with diuresis.  Discussed getting up and ambulating and make sure that she is able to hold her O2 saturation and feel comfortable from a breathing standpoint prior to discharge.  GI: Advance diet as tolerated.  Plan: Dispo:  Discharge plan is likely for home later today.  We will check in after she has eaten and been up and moving around more.  As long as breathing stable and continues to improve, we discussed going home today with precautions.  She is using her incentive spirometer and will continue this. The patient is to be  discharged to home.   LOS: 0 days    Virginia [name] 02/03/2020, 8:02 AM

## 2020-02-03 NOTE — Discharge Summary (Signed)
Physician Discharge Summary  Patient ID: Virginia [name] MRN: [id number] DOB/AGE: 1980-03-23 39 y.o.  Admit date: 02/02/2020 Discharge date: 02/03/2020  Admission Diagnoses: Malignant neoplasm of upper-inner quadrant of left breast in female, estrogen receptor positive [facility])  Discharge Diagnoses:  Principal Problem:   Malignant neoplasm of upper-inner quadrant of left breast in female, estrogen receptor positive ([place])  Discharged Condition:  The patient is in good condition and stable for discharge.    Hospital Course: On 02/02/2020, the patient underwent the following: Procedure(s): XI ROBOTIC ASSISTED BILATERAL SALPINGO OOPHORECTOMY. Her post-operative course was uncomplicated from a surgical standpoint however the patient had a laryngeal spasm with negative pressure pulmonary edema when she was extubated. She was kept overnight for observation. She was off oxygen starting around 5 am on POD1 with sats 91-92%. She was discharged to home on postoperative day 1 tolerating a regular diet, ambulating, voiding, pain controlled, sats 92 on RA.  Consults: None  Significant Diagnostic Studies: Labs this am  Treatments: Surgery: see above, supplemental oxygen  Discharge Exam (performed by Dr. Berline Lopes this am): Blood pressure 125/79, pulse 76, temperature 98.6 F (37 C), temperature source Oral, resp. rate 18, height 5\' 7"  (1.702 m), weight 187 lb (84.8 kg), SpO2 92 %. General appearance: alert, cooperative and no distress Resp: mild expiratory wheeze noted otherwise lungs clear Cardio: regular rate and rhythm, S1, S2 normal, no murmur, click, rub or gallop GI: abdomen soft with active bowel sounds, tender Extremities: extremities normal, atraumatic, no cyanosis or edema Incision/Wound: Lap sites to the abdomen with dermabond without erythema or drainage  Disposition: Discharge disposition: 01-Home or Self Care       Discharge Instructions    Call MD for:  difficulty breathing,  headache or visual disturbances   Complete by: As directed    Call MD for:  difficulty breathing, headache or visual disturbances   Complete by: As directed    Call MD for:  extreme fatigue   Complete by: As directed    Call MD for:  extreme fatigue   Complete by: As directed    Call MD for:  hives   Complete by: As directed    Call MD for:  persistant dizziness or light-headedness   Complete by: As directed    Call MD for:  persistant dizziness or light-headedness   Complete by: As directed    Call MD for:  persistant nausea and vomiting   Complete by: As directed    Call MD for:  persistant nausea and vomiting   Complete by: As directed    Call MD for:  redness, tenderness, or signs of infection (pain, swelling, redness, odor or green/yellow discharge around incision site)   Complete by: As directed    Call MD for:  redness, tenderness, or signs of infection (pain, swelling, redness, odor or green/yellow discharge around incision site)   Complete by: As directed    Call MD for:  severe uncontrolled pain   Complete by: As directed    Call MD for:  severe uncontrolled pain   Complete by: As directed    Call MD for:  temperature >100.4   Complete by: As directed    Call MD for:  temperature >100.4   Complete by: As directed    Diet - low sodium heart healthy   Complete by: As directed    Diet - low sodium heart healthy   Complete by: As directed    Driving Restrictions   Complete by: As directed  No driving x 72 hrs   Driving Restrictions   Complete by: As directed    No driving for 1 week.  Do not take narcotics and drive.   Increase activity slowly   Complete by: As directed    Gradually increase over 2 - 7 days   Increase activity slowly   Complete by: As directed    Lifting restrictions   Complete by: As directed    Avoid lifting > 20 lbs x 2 weeks.   Lifting restrictions   Complete by: As directed    No lifting greater than 10 lbs.   May shower / Bathe    Complete by: As directed    May walk up steps   Complete by: As directed    Other Restrictions   Complete by: As directed    Exercise in 5 - 10 days.  Swim in 7 days.  Douching is not recommended.   Sexual Activity Restrictions   Complete by: As directed    Can resume when you feel comfortable   Sexual Activity Restrictions   Complete by: As directed    No sexual activity, nothing in the vagina, for 2-4 weeks.     Allergies as of 02/03/2020      Reactions   Amoxicillin Diarrhea, Nausea Only   Has patient had a PCN reaction causing immediate rash, facial/tongue/throat swelling, SOB or lightheadedness with hypotension: No Has patient had a PCN reaction causing severe rash involving mucus membranes or skin necrosis: No Has patient had a PCN reaction that required hospitalization No Has patient had a PCN reaction occurring within the last 10 years: Yes If all of the above answers are "NO", then may proceed with Cephalosporin use.      Medication List    STOP taking these medications   b complex vitamins tablet   lidocaine-prilocaine cream Commonly known as: EMLA     TAKE these medications   acetaminophen 325 MG tablet Commonly known as: TYLENOL Take 650 mg by mouth every 6 (six) hours as needed for moderate pain or headache.   amLODipine 5 MG tablet Commonly known as: NORVASC Take 5 mg by mouth daily.   BIOFREEZE EX Apply 1 application topically daily as needed (pain).   docusate sodium 100 MG capsule Commonly known as: COLACE Take 100 mg by mouth daily as needed for mild constipation.   FISH OIL PO Take 2 capsules by mouth daily.   gabapentin 300 MG capsule Commonly known as: NEURONTIN TAKE 1 CAPSULE BY MOUTH DAILY AT BEDTIME What changed: See the new instructions.   goserelin 10.8 MG injection Commonly known as: ZOLADEX Inject 10.8 mg into the skin every 3 (three) months.   HAIR/SKIN/NAILS PO Take 2 tablets by mouth at bedtime.   ibuprofen 800 MG  tablet Commonly known as: ADVIL Take 1 tablet (800 mg total) by mouth every 8 (eight) hours as needed for moderate pain. For AFTER surgery only What changed: Another medication with the same name was removed. Continue taking this medication, and follow the directions you see here.   MAGNESIUM PO Take 300 mg by mouth at bedtime.   MULTIVITAMIN GUMMIES ADULT PO Take 2 tablets by mouth daily.   NONFORMULARY OR COMPOUNDED ITEM Vitamin E vaginal suppositories 200u/ml.  One pv three times weekly. What changed:   how much to take  how to take this  when to take this  additional instructions   oxyCODONE 5 MG immediate release tablet Commonly known as: Oxy IR/ROXICODONE Take  1 tablet (5 mg total) by mouth every 4 (four) hours as needed for severe pain. For AFTER surgery only, do not take and drive   tamoxifen 20 MG tablet Commonly known as: NOLVADEX TAKE 1 TABLET BY MOUTH EVERY DAY   venlafaxine XR 150 MG 24 hr capsule Commonly known as: EFFEXOR-XR TAKE 1 CAPSULE BY MOUTH EVERY MORNING WITH BREAKFAST What changed: See the new instructions.       Follow-up Information    Lafonda Mosses, MD Follow up on 02/27/2020.   Specialty: Gynecologic Oncology Why: at 1:30pm at the [facility] information: [place] [address] [phone]               Greater than thirty minutes were spend for face to face discharge instructions and discharge orders/summary in EPIC.   Signed: Dorothyann Gibbs 02/03/2020, 1:10 PM

## 2020-02-05 ENCOUNTER — Telehealth: Payer: Self-pay | Admitting: Gynecologic Oncology

## 2020-02-05 NOTE — Telephone Encounter (Signed)
Called Virginia [name] to check in after discharge. She is continue to feel better. Denies any shortness of breath since she's been home. Coughing up blood-tinged sputum, but less than previously. Abdomen is sore by the end of the day but pain also improving. Reviewed benign pathology, patient happy to hear this news.  Jeral Pinch MD Gynecologic Oncology

## 2020-02-06 ENCOUNTER — Telehealth: Payer: Self-pay

## 2020-02-06 NOTE — Telephone Encounter (Signed)
Virginia [name] states that she is eating,drinking, and urinating well. She is not SOB. She coughed up slight blood tinged mucus earlier today and none since with coughs. Passing gas. Has not had a BM. No N/V. She began colace yesterday.  Told her to begin Miralax 1 capful bid until good BM then adjust to how bowels are moving. Afebrile. Incisions D&I Abdomen very sore.  She is using tylenol or Ibuprofen intermittently. Told her to alt. The ibuprofen and tylenol ATC for the next 2-3 days and then she can decrease use if she feels comfortable. Pt aware of post op appointments as well as the office number [phone] and after hours number [phone] to call if she has any questions or concerns

## 2020-02-06 NOTE — Telephone Encounter (Signed)
LM for Virginia [name] to call back to the office to discuss how she is doing since discharge from hospital 02-03-20 from surgery.

## 2020-02-10 NOTE — Addendum Note (Signed)
Addendum  created 02/10/20 1352 by Annye Asa, MD   Intraprocedure Staff edited

## 2020-02-18 ENCOUNTER — Encounter: Payer: Self-pay | Admitting: Gynecologic Oncology

## 2020-02-20 ENCOUNTER — Telehealth: Payer: Self-pay

## 2020-02-20 NOTE — Telephone Encounter (Signed)
TC to patient regarding mychart message and pictures.  No answer, left message to return call.

## 2020-02-27 ENCOUNTER — Inpatient Hospital Stay: Payer: PRIVATE HEALTH INSURANCE | Attending: Oncology | Admitting: Gynecologic Oncology

## 2020-02-27 ENCOUNTER — Other Ambulatory Visit: Payer: Self-pay

## 2020-02-27 ENCOUNTER — Encounter: Payer: Self-pay | Admitting: Gynecologic Oncology

## 2020-02-27 VITALS — BP 112/77 | HR 71 | Temp 97.8°F | Resp 16 | Ht 67.0 in | Wt 187.2 lb

## 2020-02-27 DIAGNOSIS — Z170 Estrogen receptor positive status [ER+]: Secondary | ICD-10-CM

## 2020-02-27 DIAGNOSIS — C50212 Malignant neoplasm of upper-inner quadrant of left female breast: Secondary | ICD-10-CM

## 2020-02-27 DIAGNOSIS — Z4002 Encounter for prophylactic removal of ovary: Secondary | ICD-10-CM

## 2020-02-27 NOTE — Patient Instructions (Signed)
Great to see you! You are healing very well after surgery. Please don't hesitate to reach out if I can help with anything in the future.

## 2020-02-27 NOTE — Progress Notes (Signed)
Gynecologic Oncology Return Clinic Visit  02/27/2020  Reason for Visit: Postop follow-up  Treatment History: Oncology History  Malignant neoplasm of upper-inner quadrant of left breast in female, estrogen receptor positive ([place])  01/15/2016 Initial Biopsy   Left breast upper inner quadrant biopsy: IDC, ER/PR positive, Ki-67 5%, HER-2 positive (ratio 2.41).  One of 2 areas of abnormal calcifications, more posterior one, was biopsied and showed atypical ductal hyperplasia, and a 4.1cm area of non mass like enhancement in the left breat underwent biopsy showing fibrocystic changes.    01/16/2016 Initial Diagnosis   Malignant neoplasm of upper-inner quadrant of left female breast ([name])   12/2015 -  Anti-estrogen oral therapy   Tamoxifen daily, held during chemotherapy and radiation, and resumed on 07/2016   02/22/2016 Genetic Testing   Patient has genetic testing done for breast cancer. Negative genetic testing on a custom breast cancer panel.  Negative genetic testing for the MSH2 inversion analysis ([place] inversion). The Custom gene panel offered by [facility] includes sequencing and rearrangement analysis for the following 21 genes:  ATM, BARD1, BRCA1, BRCA2, BRIP1, CDH1, CHEK2, EPCAM, FANCC, HOXB13, MLH1, MSH2, MSH6, NBN, PALB2, PMS2, PTEN, RAD51C, RAD51D, TP53, and XRCC2.   The report date is February 22, 2016.   03/13/2016 Surgery   Left lumpectomy and SLNB: IDC, grade 2, multiple foci, 1.3 cm, 0.8 cm, and 0.5 cm, margins negative, 2 SLN negative   04/10/2016 - 04/10/2017 Chemotherapy   Paclitaxel weekly and Trastuzumab every 3 weeks.  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  Completed one year of maintenance Trastuzumab    06/11/2016 - 07/23/2016 Radiation Therapy   1) Left Breast / 50 Gy in 25 fractions.  2) Left Breast Boost / 10 Gy in 5 fractions.     05/2016 Miscellaneous   Due to resumed menses, Goserelin started, initially given every 4 weeks, however changed to  every 12 weeks with estradiol monitoring.       Interval History: Patient reports overall doing well.  Her breathing has completely normalized and her cough improved about 4 days after surgery.  She denies any significant phlegm production.  The night after leaving the hospital, she felt very weak and tired.  She reports having a good appetite without any nausea or emesis.  She had some constipation immediately after surgery, but this has improved and she is now making sure to drink plenty of fluids and fiber to keep her bowels regular.  She denies any urinary symptoms.  She notes some increase in her hot flashes, but does not say that this is bothersome.  She has not been taking her gabapentin recently.  Past Medical/Surgical History: Past Medical History:  Diagnosis Date  . Anemia    as a child  . Anxiety   . Cholelithiasis    with pregnancy  . Elevated liver enzymes    during pregnancy March 2015  . Family history of breast cancer   . Family history of prostate cancer   . GERD (gastroesophageal reflux disease)    one episode with pregnancy  . History of chemotherapy   . History of neuropathy    with chemo  . History of radiation therapy 06/11/16- 07/23/16   Left Breast 50 Gy in 25 fractions, Left Breast boost 10 Gy in 5 fractions.   . Hypertension   . Hypothyroidism    with pregnancy  . Lymphedema of arm    LEFT  . Malignant neoplasm of upper-inner quadrant of left female breast ([place])  01/16/2016  . Migraine   . PONV (postoperative nausea and vomiting)     Past Surgical History:  Procedure Laterality Date  . BREAST LUMPECTOMY WITH RADIOACTIVE SEED AND SENTINEL LYMPH NODE BIOPSY Left 03/13/2016   Procedure: LEFT BREAST LUMPECTOMY WITH RADIOACTIVE SEED X 2 AND SENTINEL LYMPH NODE BIOPSY;  Surgeon: Stark Klein, MD;  Location: [facility];  Service: General;  Laterality: Left;  . NO PAST SURGERIES    . PORT-A-CATH REMOVAL N/A 04/16/2017   Procedure: REMOVAL PORT-A-CATH;  Surgeon:  Stark Klein, MD;  Location: [facility];  Service: General;  Laterality: N/A;  . PORTACATH PLACEMENT Right 03/13/2016   Procedure: INSERTION PORT-A-CATH;  Surgeon: Stark Klein, MD;  Location: [facility];  Service: General;  Laterality: Right;  . ROBOTIC ASSISTED SALPINGO OOPHERECTOMY Bilateral 02/02/2020   Procedure: XI ROBOTIC ASSISTED SALPINGO OOPHORECTOMY;  Surgeon: Lafonda Mosses, MD;  Location: [facility];  Service: Gynecology;  Laterality: Bilateral;    Family History  Problem Relation Age of Onset  . Hypertension Mother   . Hypertension Father   . Prostate cancer Father   . Liver cancer Maternal Grandmother   . Breast cancer Paternal Grandmother        dx in her 39s  . Lung cancer Paternal Aunt        smoker  . Heart attack Paternal Grandfather   . Liver cancer Other     Social History   Socioeconomic History  . Marital status: Married    Spouse name: Not on file  . Number of children: 2  . Years of education: Not on file  . Highest education level: Not on file  Occupational History  . Not on file  Tobacco Use  . Smoking status: Never Smoker  . Smokeless tobacco: Never Used  Vaping Use  . Vaping Use: Never used  Substance and Sexual Activity  . Alcohol use: No  . Drug use: No  . Sexual activity: Not Currently    Birth control/protection: None  Other Topics Concern  . Not on file  Social History Narrative  . Not on file   Social Determinants of Health   Financial Resource Strain: Not on file  Food Insecurity: Not on file  Transportation Needs: Not on file  Physical Activity: Not on file  Stress: Not on file  Social Connections: Not on file    Current Medications:  Current Outpatient Medications:  .  acetaminophen (TYLENOL) 325 MG tablet, Take 650 mg by mouth every 6 (six) hours as needed for moderate pain or headache. , Disp: , Rfl:  .  amLODipine (NORVASC) 5 MG tablet, Take 5 mg by mouth daily., Disp: , Rfl:  .  Biotin w/ Vitamins C & E (HAIR/SKIN/NAILS PO),  Take 2 tablets by mouth at bedtime., Disp: , Rfl:  .  docusate sodium (COLACE) 100 MG capsule, Take 100 mg by mouth daily as needed for mild constipation., Disp: , Rfl:  .  gabapentin (NEURONTIN) 300 MG capsule, TAKE 1 CAPSULE BY MOUTH DAILY AT BEDTIME (Patient taking differently: Take 300 mg by mouth at bedtime as needed (pain).), Disp: 90 capsule, Rfl: 3 .  goserelin (ZOLADEX) 10.8 MG injection, Inject 10.8 mg into the skin every 3 (three) months. , Disp: , Rfl:  .  ibuprofen (ADVIL) 800 MG tablet, Take 1 tablet (800 mg total) by mouth every 8 (eight) hours as needed for moderate pain. For AFTER surgery only, Disp: 30 tablet, Rfl: 0 .  MAGNESIUM PO, Take 300 mg by mouth at  bedtime., Disp: , Rfl:  .  Menthol, Topical Analgesic, (BIOFREEZE EX), Apply 1 application topically daily as needed (pain)., Disp: , Rfl:  .  Multiple Vitamins-Minerals (MULTIVITAMIN GUMMIES ADULT PO), Take 2 tablets by mouth daily., Disp: , Rfl:  .  NONFORMULARY OR COMPOUNDED ITEM, Vitamin E vaginal suppositories 200u/ml.  One pv three times weekly. (Patient taking differently: Place 1 suppository vaginally 3 (three) times a week. Vitamin E vaginal suppositories 200u/ml.), Disp: 36 each, Rfl: 3 .  Omega-3 Fatty Acids (FISH OIL PO), Take 2 capsules by mouth daily. , Disp: , Rfl:  .  oxyCODONE (OXY IR/ROXICODONE) 5 MG immediate release tablet, Take 1 tablet (5 mg total) by mouth every 4 (four) hours as needed for severe pain. For AFTER surgery only, do not take and drive, Disp: 10 tablet, Rfl: 0 .  tamoxifen (NOLVADEX) 20 MG tablet, TAKE 1 TABLET BY MOUTH EVERY DAY (Patient taking differently: Take 20 mg by mouth daily.), Disp: 90 tablet, Rfl: 3 .  venlafaxine XR (EFFEXOR-XR) 150 MG 24 hr capsule, TAKE 1 CAPSULE BY MOUTH EVERY MORNING WITH BREAKFAST (Patient taking differently: Take 150 mg by mouth daily with breakfast.), Disp: 90 capsule, Rfl: 5  Review of Systems: Denies appetite changes, fevers, chills, fatigue, unexplained  weight changes. Denies hearing loss, neck lumps or masses, mouth sores, ringing in ears or voice changes. Denies cough or wheezing.  Denies shortness of breath. Denies chest pain or palpitations. Denies leg swelling. Denies abdominal distention, pain, blood in stools, constipation, diarrhea, nausea, vomiting, or early satiety. Denies pain with intercourse, dysuria, frequency, hematuria or incontinence. Denies pelvic pain, vaginal bleeding or vaginal discharge.   Denies joint pain, back pain or muscle pain/cramps. Denies itching, rash, or wounds. Denies dizziness, headaches, numbness or seizures. Denies swollen lymph nodes or glands, denies easy bruising or bleeding. Denies anxiety, depression, confusion, or decreased concentration.  Physical Exam: BP 112/77 (BP Location: Left Arm, Patient Position: Sitting)   Pulse 71   Temp 97.8 F (36.6 C) (Tympanic)   Resp 16   Ht $R'5\' 7"'YB$  (1.702 m)   Wt 187 lb 3.2 oz (84.9 kg)   SpO2 96%   BMI 29.32 kg/m  General: Alert, oriented, no acute distress. HEENT: Normocephalic, atraumatic, sclera anicteric. Chest: Unlabored breathing on room air. Abdomen: soft, nontender.  Normoactive bowel sounds.  No masses or hepatosplenomegaly appreciated.  Well-healing laparoscopic incisions. Extremities: Grossly normal range of motion.  Warm, well perfused.    Laboratory & Radiologic Studies: A. OVARIES AND FALLOPIAN TUBES, BILATERAL, SALPINGO OOPHORECTOMY:  - Right and left ovaries:    Benign follicular cysts.    No endometriosis or evidence of malignancy.   - Right and left fallopian tubes:    Unremarkable.    No endometriosis or evidence of malignancy.   Assessment & Plan: Virginia [name] is a 40 y.o. woman with early stage estrogen receptor positive breast cancer who is status post therapeutic BSO that was complicated by laryngeal spasm and Negative pressure pulmonary edema during extubation requiring overnight hospital stay.  Patient has  healed very nicely from surgery and is meeting all postoperative milestones.  She also has recovered completely from the NPPE that occurred upon extubation due to a laryngeal spasm. Patient will be discharged back to her medical oncologist.  18 minutes of total time was spent for this patient encounter, including preparation, face-to-face counseling with the patient and coordination of care, and documentation of the encounter.  Jeral Pinch, MD  Division of Gynecologic Oncology  Department  of Obstetrics and Gynecology  [facility]

## 2020-04-09 ENCOUNTER — Other Ambulatory Visit: Payer: Self-pay | Admitting: Oncology

## 2020-04-10 ENCOUNTER — Other Ambulatory Visit: Payer: Self-pay | Admitting: *Deleted

## 2020-04-10 MED ORDER — TAMOXIFEN CITRATE 20 MG PO TABS: 20.0000 mg | ORAL_TABLET | Freq: Every day | ORAL | 3 refills | Status: DC

## 2020-05-07 ENCOUNTER — Other Ambulatory Visit: Payer: Self-pay | Admitting: Oncology

## 2020-05-07 ENCOUNTER — Telehealth: Payer: Self-pay | Admitting: Oncology

## 2020-05-07 NOTE — Telephone Encounter (Signed)
Rescheduled April appointments to May per providers request, called to inform patient regarding new appointment. Left a voicemail.

## 2020-06-12 ENCOUNTER — Ambulatory Visit: Payer: PRIVATE HEALTH INSURANCE | Admitting: Oncology

## 2020-06-12 ENCOUNTER — Other Ambulatory Visit: Payer: PRIVATE HEALTH INSURANCE

## 2020-06-27 ENCOUNTER — Encounter: Payer: Self-pay | Admitting: Oncology

## 2020-06-27 ENCOUNTER — Inpatient Hospital Stay: Payer: PRIVATE HEALTH INSURANCE | Attending: Family Medicine

## 2020-06-27 ENCOUNTER — Inpatient Hospital Stay (HOSPITAL_BASED_OUTPATIENT_CLINIC_OR_DEPARTMENT_OTHER): Payer: PRIVATE HEALTH INSURANCE | Admitting: Oncology

## 2020-06-27 DIAGNOSIS — Z170 Estrogen receptor positive status [ER+]: Secondary | ICD-10-CM

## 2020-06-27 DIAGNOSIS — C50212 Malignant neoplasm of upper-inner quadrant of left female breast: Secondary | ICD-10-CM

## 2020-06-27 NOTE — Progress Notes (Signed)
[place]  [phone] [phone]    ID: Virginia [name] DOB: [birth year]  MR#: [id number]  [facility]#:[id number]  Patient Care Team: Virginia Shanks, MD as PCP - General (Family Medicine) Virginia Klein, MD as Consulting Physician (General Surgery) Virginia [name], Virginia Dad, MD as Consulting Physician (Oncology) Virginia Gibson, MD as Attending Physician (Radiation Oncology) Virginia Bison Charlestine Massed, NP as Nurse Practitioner (Hematology and Oncology) OTHER MD:   CHIEF COMPLAINT:  Estrogen receptor positive breast cancer  CURRENT TREATMENT: tamoxifen   INTERVAL HISTORY: Virginia [name] was scheduled today for 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  However she did not show.   She underwent therapeutic BSO on 02/02/2020 under Dr. Berline [name]. Pathology from the procedure [id number]) was benign, as expected.     REVIEW OF SYSTEMS: Virginia [name]    COVID 19 VACCINATION STATUS: That is post motor now x2, most recently March 2021, no booster as of 01/14/2020   BREAST CANCER HISTORY: From the original intake note:  Virginia [name] noted some itching on her breasts and since this was the symptom that preceded her grandmother's breast cancer she underwent baseline bilateral screening mammography at [facility] 01/10/2016. The breast density was category B. In the left breast central to the nipple there was a 1.7 cm irregular high density mass with pleomorphic calcifications. Accordingly the patient was brought back 01/14/2016 for left diagnostic mammography and ultrasonography. This confirmed a 0.8 cm irregular mass in the left breast at the 10:00 position. 0.5 cm from the nipple lateral and posterior to that there was a 0.2 cm cluster of amorphous calcifications with an associated density. Anterior to the index mass was a 0.5 cm cluster of amorphous calcifications. All 3 findings taken together 3 cm. Ultrasound of the left breast found a 1 cm irregular mass in the left breast at the 10:00  position retroareolarly. There were no other sonographic abnormalities and the left axilla was sonographically benign  On 01/15/2016 the patient underwent core needle biopsy of the left breast index lesion at 10:00, and this found invasive ductal carcinoma, E-cadherin positive, estrogen receptor 90% positive, progesterone receptor 40% positive, both with moderate staining intensity, with an MIB-1 of 5%, but HER-2 amplified, with a signals ratio of 2.41 and the number per cell 6.50. One of the 2 areas of abnormal calcifications, the more posterior one measuring 0.2 cm, was also biopsied and showed only atypical ductal hyperplasia (S [id number]).  Her subsequent history is as detailed below   PAST MEDICAL HISTORY: Past Medical History:  Diagnosis Date  . Anemia    as a child  . Anxiety   . Cholelithiasis    with pregnancy  . Elevated liver enzymes    during pregnancy March 2015  . Family history of breast cancer   . Family history of prostate cancer   . GERD (gastroesophageal reflux disease)    one episode with pregnancy  . History of chemotherapy   . History of neuropathy    with chemo  . History of radiation therapy 06/11/16- 07/23/16   Left Breast 50 Gy in 25 fractions, Left Breast boost 10 Gy in 5 fractions.   . Hypertension   . Hypothyroidism    with pregnancy  . Lymphedema of arm    LEFT  . Malignant neoplasm of upper-inner quadrant of left female breast ([place]) 01/16/2016  . Migraine   . PONV (postoperative nausea and vomiting)     PAST SURGICAL HISTORY: Past Surgical History:  Procedure Laterality Date  .  BREAST LUMPECTOMY WITH RADIOACTIVE SEED AND SENTINEL LYMPH NODE BIOPSY Left 03/13/2016   Procedure: LEFT BREAST LUMPECTOMY WITH RADIOACTIVE SEED X 2 AND SENTINEL LYMPH NODE BIOPSY;  Surgeon: Virginia Klein, MD;  Location: [facility];  Service: General;  Laterality: Left;  . NO PAST SURGERIES    . PORT-A-CATH REMOVAL N/A 04/16/2017   Procedure: REMOVAL PORT-A-CATH;   Surgeon: Virginia Klein, MD;  Location: [facility];  Service: General;  Laterality: N/A;  . PORTACATH PLACEMENT Right 03/13/2016   Procedure: INSERTION PORT-A-CATH;  Surgeon: Virginia Klein, MD;  Location: [facility];  Service: General;  Laterality: Right;  . ROBOTIC ASSISTED SALPINGO OOPHERECTOMY Bilateral 02/02/2020   Procedure: XI ROBOTIC ASSISTED SALPINGO OOPHORECTOMY;  Surgeon: Virginia Mosses, MD;  Location: [facility];  Service: Gynecology;  Laterality: Bilateral;    FAMILY HISTORY: Family History  Problem Relation Age of Onset  . Hypertension Mother   . Hypertension Father   . Prostate cancer Father   . Liver cancer Maternal Grandmother   . Breast cancer Paternal Grandmother        dx in her 46s  . Lung cancer Paternal Aunt        smoker  . Heart attack Paternal Grandfather   . Liver cancer Other   The patient's parents are in their mid 51s as of November 2017. The patient father was diagnosed with prostate cancer at age 75. A paternal aunt was diagnosed with lung cancer in her late 8s. Also the paternal grandmother was diagnosed with breast cancer at the age of 18. On the mother's side there are 2 cases of liver cancer, age of diagnosis unknown.   GYNECOLOGIC HISTORY:  No LMP recorded. (Menstrual status: Other).  Menarche age 26, first live birth age 37, she is [place] P2. She was still having periods at the time of breast cancer diagnosis.. She remotely used a number ring and oral contraceptives but stopped in 2016.more recently they used barrier methods for contraception.   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  She will be completing a nurse practitioner program December 2021 and taking her boards that month..  Her husband Virginia [name] works as a Printmaker for a Forensic psychologist. Their children are Virginia [name] age 57 and playing the cello in the school orchestra and Virginia [name] who is also doing very well in school. Virginia [name] is not a church  attender   ADVANCED DIRECTIVES: In the absence of any documents to the contrary the patient's husband is her healthcare power of attorney   HEALTH MAINTENANCE: Social History   Tobacco Use  . Smoking status: Never Smoker  . Smokeless tobacco: Never Used  Vaping Use  . Vaping Use: Never used  Substance Use Topics  . Alcohol use: No  . Drug use: No     Colonoscopy:  PAP: 02/2019, abnormal  Bone density:   Allergies  Allergen Reactions  . Amoxicillin Diarrhea and Nausea Only    Has patient had a PCN reaction causing immediate rash, facial/tongue/throat swelling, SOB or lightheadedness with hypotension: No Has patient had a PCN reaction causing severe rash involving mucus membranes or skin necrosis: No Has patient had a PCN reaction that required hospitalization No Has patient had a PCN reaction occurring within the last 10 years: Yes If all of the above answers are "NO", then may proceed with Cephalosporin use.     Current Outpatient Medications  Medication Sig Dispense Refill  . acetaminophen (TYLENOL) 325 MG tablet Take  650 mg by mouth every 6 (six) hours as needed for moderate pain or headache.     Marland Kitchen amLODipine (NORVASC) 5 MG tablet Take 5 mg by mouth daily.    . Biotin w/ Vitamins C & E (HAIR/SKIN/NAILS PO) Take 2 tablets by mouth at bedtime.    . docusate sodium (COLACE) 100 MG capsule Take 100 mg by mouth daily as needed for mild constipation.    . gabapentin (NEURONTIN) 300 MG capsule TAKE 1 CAPSULE BY MOUTH DAILY AT BEDTIME (Patient taking differently: Take 300 mg by mouth at bedtime as needed (pain).) 90 capsule 3  . goserelin (ZOLADEX) 10.8 MG injection Inject 10.8 mg into the skin every 3 (three) months.     Marland Kitchen ibuprofen (ADVIL) 800 MG tablet Take 1 tablet (800 mg total) by mouth every 8 (eight) hours as needed for moderate pain. For AFTER surgery only 30 tablet 0  . MAGNESIUM PO Take 300 mg by mouth at bedtime.    . Menthol, Topical Analgesic, (BIOFREEZE EX) Apply 1  application topically daily as needed (pain).    . Multiple Vitamins-Minerals (MULTIVITAMIN GUMMIES ADULT PO) Take 2 tablets by mouth daily.    . NONFORMULARY OR COMPOUNDED ITEM Vitamin E vaginal suppositories 200u/ml.  One pv three times weekly. (Patient taking differently: Place 1 suppository vaginally 3 (three) times a week. Vitamin E vaginal suppositories 200u/ml.) 36 each 3  . Omega-3 Fatty Acids (FISH OIL PO) Take 2 capsules by mouth daily.     Marland Kitchen oxyCODONE (OXY IR/ROXICODONE) 5 MG immediate release tablet Take 1 tablet (5 mg total) by mouth every 4 (four) hours as needed for severe pain. For AFTER surgery only, do not take and drive 10 tablet 0  . tamoxifen (NOLVADEX) 20 MG tablet Take 1 tablet (20 mg total) by mouth daily. 90 tablet 3  . venlafaxine XR (EFFEXOR-XR) 150 MG 24 hr capsule TAKE 1 CAPSULE BY MOUTH EVERY MORNING WITH BREAKFAST 90 capsule 11   No current facility-administered medications for this visit.    OBJECTIVE: African-American woman in no acute distress  There were no vitals filed for this visit.   There is no height or weight on file to calculate BMI.    ECOG FS:1 - Symptomatic but completely ambulatory   LAB RESULTS:  CMP     Component Value Date/Time   NA 139 02/03/2020 0530   NA 142 02/27/2017 1350   K 4.2 02/03/2020 0530   K 3.7 02/27/2017 1350   CL 101 02/03/2020 0530   CO2 29 02/03/2020 0530   CO2 28 02/27/2017 1350   GLUCOSE 113 (H) 02/03/2020 0530   GLUCOSE 124 02/27/2017 1350   BUN 8 02/03/2020 0530   BUN 8.9 02/27/2017 1350   CREATININE 0.75 02/03/2020 0530   CREATININE 0.79 07/31/2017 1349   CREATININE 0.8 02/27/2017 1350   CALCIUM 8.8 (L) 02/03/2020 0530   CALCIUM 9.5 02/27/2017 1350   PROT 7.5 01/17/2020 0916   PROT 7.3 02/27/2017 1350   ALBUMIN 3.9 01/17/2020 0916   ALBUMIN 3.8 02/27/2017 1350   AST 18 01/17/2020 0916   AST 24 07/31/2017 1349   AST 21 02/27/2017 1350   ALT 15 01/17/2020 0916   ALT 19 07/31/2017 1349   ALT 25  02/27/2017 1350   ALKPHOS 71 01/17/2020 0916   ALKPHOS 82 02/27/2017 1350   BILITOT 0.3 01/17/2020 0916   BILITOT <0.2 (L) 07/31/2017 1349   BILITOT 0.22 02/27/2017 1350   GFRNONAA >60 02/03/2020 0530   GFRNONAA >60  07/31/2017 1349   GFRAA >60 06/22/2019 1314   GFRAA >60 07/31/2017 1349    INo results found for: SPEP, UPEP  Lab Results  Component Value Date   WBC 8.0 02/03/2020   NEUTROABS 1.5 (L) 01/17/2020   HGB 11.9 (L) 02/03/2020   HCT 38.2 02/03/2020   MCV 87.6 02/03/2020   PLT 172 02/03/2020      Chemistry      Component Value Date/Time   NA 139 02/03/2020 0530   NA 142 02/27/2017 1350   K 4.2 02/03/2020 0530   K 3.7 02/27/2017 1350   CL 101 02/03/2020 0530   CO2 29 02/03/2020 0530   CO2 28 02/27/2017 1350   BUN 8 02/03/2020 0530   BUN 8.9 02/27/2017 1350   CREATININE 0.75 02/03/2020 0530   CREATININE 0.79 07/31/2017 1349   CREATININE 0.8 02/27/2017 1350      Component Value Date/Time   CALCIUM 8.8 (L) 02/03/2020 0530   CALCIUM 9.5 02/27/2017 1350   ALKPHOS 71 01/17/2020 0916   ALKPHOS 82 02/27/2017 1350   AST 18 01/17/2020 0916   AST 24 07/31/2017 1349   AST 21 02/27/2017 1350   ALT 15 01/17/2020 0916   ALT 19 07/31/2017 1349   ALT 25 02/27/2017 1350   BILITOT 0.3 01/17/2020 0916   BILITOT <0.2 (L) 07/31/2017 1349   BILITOT 0.22 02/27/2017 1350       No results found for: LABCA2  No components found for: LABCA125  No results for input(s): INR in the last 168 hours.  Urinalysis    Component Value Date/Time   COLORURINE YELLOW 02/02/2020 0539   APPEARANCEUR CLEAR 02/02/2020 0539   LABSPEC 1.014 02/02/2020 0539   PHURINE 6.0 02/02/2020 0539   GLUCOSEU NEGATIVE 02/02/2020 0539   HGBUR NEGATIVE 02/02/2020 0539   BILIRUBINUR NEGATIVE 02/02/2020 0539   KETONESUR NEGATIVE 02/02/2020 0539   PROTEINUR NEGATIVE 02/02/2020 0539   UROBILINOGEN 0.2 10/14/2012 1954   NITRITE NEGATIVE 02/02/2020 0539   LEUKOCYTESUR SMALL (A) 02/02/2020 0539     STUDIES: No results found.   ELIGIBLE FOR AVAILABLE RESEARCH PROTOCOL: no   ASSESSMENT: 40 y.o. [place] woman status post left breast upper inner quadrant biopsy 01/15/2016 for a clinical T1c N0, stage IA invasive ductal carcinoma, E-cadherin positive, estrogen receptor 90% positive, progesterone receptor 40% positive, both with moderate staining intensity, with an MIB-1 of 5%, but HER-2 amplified, with a signals ratio of 2.41 and the number per cell 6.50.  (a) one of 2 areas of abnormal calcifications, the more posterior one measuring 0.2 cm, was also biopsied and showed only atypical ductal hyperplasia  (b) a 4.1 cm area of non-masslike enhancement in the left breast underwent biopsy 02/19/2016 showing only fibrocystic changes  (1) tamoxifen started 01/23/2016 neoadjuvantly  (2) genetics testing 02/22/2016 through the Breast/Ovarian gene panel offered by [facility] found no deleterious mutations in  ATM, BARD1, BRCA1, BRCA2, BRIP1, CDH1, CHEK2, EPCAM, FANCC, MLH1, MSH2, MSH6, NBN, PALB2, PMS2, PTEN, RAD51C, RAD51D, TP53, and XRCC2.     (3) status post left lumpectomy and sentinel lymph node sampling 03/13/2016 for an mpT1c pN0, stage IA invasive ductal carcinoma, grade 2, with negative margins.  (4) paclitaxel weekly 12 and trastuzumab every 21 days beginning on 04/10/2016.  (a) paclitaxel discontinued after 5 doses because of atypical neuropathy symptoms. Last dose 04/28/2016  (5) trastuzumab continued to complete a year--last dose 04/10/2017  (a) echocardiogram 02/11/2016 shows ejection fraction in the 60-65% range.  (b) echo 05/28/2916 shows EF in the 65-70%  range  (c) echo on 09/26/2016 shows LVEF of 55-60%  (d) echo on 01/14/2017 shows EF in the 60-65%  (6) adjuvant radiation 06/11/16 - 07/23/16 Site/dose:    1) Left Breast / 50 Gy in 25 fractions 2) Left Breast Boost / 10 Gy in 5 fractions  (7) tamoxifen resumed to 07/25/2016  (8) patient resumed menses post chemo, April  2018  (a) goserelin started 06/17/2016, initially given every 28 days, changed to every 12 weeks on 11/12/16 with estradiol monitoring  (b) referred 06/22/2019 for BSO   PLAN: [name] did not show for her 06/27/2020 visit.  A follow-up letter has been sent   Sarajane Jews C. Lorretta Kerce, MD  06/27/20 4:24 PM Medical Oncology and Hematology [facility] [place], [place] [address] Tel. [phone]    Fax. [phone]   I, Wilburn Mylar, am acting as scribe for Dr. Virgie [name]. Olando Willems.  I, Lurline Del MD, have reviewed the above documentation for accuracy and completeness, and I agree with the above.   *Total Encounter Time as defined by the Centers for Medicare and Medicaid Services includes, in addition to the face-to-face time of a patient visit (documented in the note above) non-face-to-face time: obtaining and reviewing outside history, ordering and reviewing medications, tests or procedures, care coordination (communications with other health care professionals or caregivers) and documentation in the medical record.

## 2020-06-28 ENCOUNTER — Telehealth: Payer: Self-pay | Admitting: Oncology

## 2020-06-28 NOTE — Telephone Encounter (Signed)
R/s appts per 5/5 sch msg. Called pt, no answer. Left msg with appts date and time.  

## 2020-07-04 ENCOUNTER — Encounter: Payer: Self-pay | Admitting: Oncology

## 2020-08-23 ENCOUNTER — Inpatient Hospital Stay: Payer: BC Managed Care – PPO | Attending: Family Medicine

## 2020-08-23 ENCOUNTER — Inpatient Hospital Stay (HOSPITAL_BASED_OUTPATIENT_CLINIC_OR_DEPARTMENT_OTHER): Payer: BC Managed Care – PPO | Admitting: Oncology

## 2020-08-23 ENCOUNTER — Other Ambulatory Visit: Payer: Self-pay

## 2020-08-23 VITALS — BP 130/94 | HR 71 | Temp 97.7°F | Resp 18 | Ht 67.0 in | Wt 197.6 lb

## 2020-08-23 DIAGNOSIS — R232 Flushing: Secondary | ICD-10-CM | POA: Diagnosis not present

## 2020-08-23 DIAGNOSIS — K219 Gastro-esophageal reflux disease without esophagitis: Secondary | ICD-10-CM | POA: Diagnosis not present

## 2020-08-23 DIAGNOSIS — C50212 Malignant neoplasm of upper-inner quadrant of left female breast: Secondary | ICD-10-CM | POA: Insufficient documentation

## 2020-08-23 DIAGNOSIS — E039 Hypothyroidism, unspecified: Secondary | ICD-10-CM | POA: Diagnosis not present

## 2020-08-23 DIAGNOSIS — Z170 Estrogen receptor positive status [ER+]: Secondary | ICD-10-CM | POA: Diagnosis not present

## 2020-08-23 DIAGNOSIS — Z79899 Other long term (current) drug therapy: Secondary | ICD-10-CM | POA: Diagnosis not present

## 2020-08-23 DIAGNOSIS — I10 Essential (primary) hypertension: Secondary | ICD-10-CM | POA: Diagnosis not present

## 2020-08-23 DIAGNOSIS — Z923 Personal history of irradiation: Secondary | ICD-10-CM | POA: Insufficient documentation

## 2020-08-23 DIAGNOSIS — Z79810 Long term (current) use of selective estrogen receptor modulators (SERMs): Secondary | ICD-10-CM | POA: Diagnosis not present

## 2020-08-23 DIAGNOSIS — Z9221 Personal history of antineoplastic chemotherapy: Secondary | ICD-10-CM | POA: Insufficient documentation

## 2020-08-23 DIAGNOSIS — Z90722 Acquired absence of ovaries, bilateral: Secondary | ICD-10-CM | POA: Diagnosis not present

## 2020-08-23 DIAGNOSIS — H6501 Acute serous otitis media, right ear: Secondary | ICD-10-CM | POA: Diagnosis not present

## 2020-08-23 LAB — COMPREHENSIVE METABOLIC PANEL
ALT: 18 U/L (ref 0–44)
AST: 16 U/L (ref 15–41)
Albumin: 3.8 g/dL (ref 3.5–5.0)
Alkaline Phosphatase: 79 U/L (ref 38–126)
Anion gap: 7 (ref 5–15)
BUN: 11 mg/dL (ref 6–20)
CO2: 29 mmol/L (ref 22–32)
Calcium: 9.9 mg/dL (ref 8.9–10.3)
Chloride: 104 mmol/L (ref 98–111)
Creatinine, Ser: 0.88 mg/dL (ref 0.44–1.00)
GFR, Estimated: >60 mL/min (ref >60)
Glucose, Bld: 81 mg/dL (ref 70–99)
Potassium: 4.3 mmol/L (ref 3.5–5.1)
Sodium: 140 mmol/L (ref 135–145)
Total Bilirubin: <0.2 mg/dL — ABNORMAL LOW (ref 0.3–1.2)
Total Protein: 7.9 g/dL (ref 6.5–8.1)

## 2020-08-23 LAB — CBC WITH DIFFERENTIAL/PLATELET
Abs Immature Granulocytes: 0.01 10*3/uL (ref 0.00–0.07)
Basophils Absolute: 0 10*3/uL (ref 0.0–0.1)
Basophils Relative: 1 %
Eosinophils Absolute: 0.1 10*3/uL (ref 0.0–0.5)
Eosinophils Relative: 2 %
HCT: 39 % (ref 36.0–46.0)
Hemoglobin: 12.6 g/dL (ref 12.0–15.0)
Immature Granulocytes: 0 %
Lymphocytes Relative: 37 %
Lymphs Abs: 2.1 10*3/uL (ref 0.7–4.0)
MCH: 27.5 pg (ref 26.0–34.0)
MCHC: 32.3 g/dL (ref 30.0–36.0)
MCV: 85 fL (ref 80.0–100.0)
Monocytes Absolute: 0.6 10*3/uL (ref 0.1–1.0)
Monocytes Relative: 11 %
Neutro Abs: 2.9 10*3/uL (ref 1.7–7.7)
Neutrophils Relative %: 49 %
Platelets: 266 10*3/uL (ref 150–400)
RBC: 4.59 MIL/uL (ref 3.87–5.11)
RDW: 13.3 % (ref 11.5–15.5)
WBC: 5.8 10*3/uL (ref 4.0–10.5)
nRBC: 0 % (ref 0.0–0.2)

## 2020-08-23 MED ORDER — VENLAFAXINE HCL ER 150 MG PO CP24: ORAL_CAPSULE | ORAL | 4 refills | Status: DC

## 2020-08-23 MED ORDER — TAMOXIFEN CITRATE 20 MG PO TABS: 20.0000 mg | ORAL_TABLET | Freq: Every day | ORAL | 3 refills | Status: DC

## 2020-08-23 NOTE — Progress Notes (Addendum)
[place]  [phone] [phone]    ID: Virginia [name] DOB: 1980/10/22  MR#: [id number]  WKM#:[id number]  Patient Care Team: [name], MD as PCP - General (Family Medicine) Stark Klein, MD as Consulting Physician (General Surgery) Coda Filler, Virgie Dad, MD as Consulting Physician (Oncology) Eppie Gibson, MD as Attending Physician (Radiation Oncology) Delice Bison Charlestine Massed, NP as Nurse Practitioner (Hematology and Oncology) OTHER MD:   CHIEF COMPLAINT:  Estrogen receptor positive breast cancer  CURRENT TREATMENT: tamoxifen   INTERVAL HISTORY: Virginia [name] returns today for 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    She continues on tamoxifen.  She still has issues with hot flashes particularly at night.  She does take gabapentin which is helpful.  She also uses a fan at night.  When she is stressed hot flashes are worse.  She has minimal vaginal wetness issues.  Since her last visit, she underwent bilateral diagnostic mammography with tomography at [facility] on 01/18/2020 showing: breast density category B; no evidence of malignancy in either breast.   She also underwent therapeutic BSO on 02/02/2020 under Dr. Berline Lopes. Pathology from the procedure [phone]) was benign, as expected.   REVIEW OF SYSTEMS: [name] completed her nurse practitioner program and certified December 3832.  She is now working in Insurance underwriter as a Designer, jewellery and she finds this very stressful.  She does have control of her schedule but nevertheless is there late many days and has difficulty completing her dictations in a way that does not interfere with her family time.  A detailed review of systems was otherwise noncontributory   COVID 19 VACCINATION STATUS: That is post motor now x2, most recently March 2021, no booster as of 01/14/2020   BREAST CANCER HISTORY: From the original intake note:  Virginia [name] noted some itching on her breasts and since this was  the symptom that preceded her grandmother's breast cancer she underwent baseline bilateral screening mammography at [facility] 01/10/2016. The breast density was category B. In the left breast central to the nipple there was a 1.7 cm irregular high density mass with pleomorphic calcifications. Accordingly the patient was brought back 01/14/2016 for left diagnostic mammography and ultrasonography. This confirmed a 0.8 cm irregular mass in the left breast at the 10:00 position. 0.5 cm from the nipple lateral and posterior to that there was a 0.2 cm cluster of amorphous calcifications with an associated density. Anterior to the index mass was a 0.5 cm cluster of amorphous calcifications. All 3 findings taken together 3 cm. Ultrasound of the left breast found a 1 cm irregular mass in the left breast at the 10:00 position retroareolarly. There were no other sonographic abnormalities and the left axilla was sonographically benign  On 01/15/2016 the patient underwent core needle biopsy of the left breast index lesion at 10:00, and this found invasive ductal carcinoma, E-cadherin positive, estrogen receptor 90% positive, progesterone receptor 40% positive, both with moderate staining intensity, with an MIB-1 of 5%, but HER-2 amplified, with a signals ratio of 2.41 and the number per cell 6.50. One of the 2 areas of abnormal calcifications, the more posterior one measuring 0.2 cm, was also biopsied and showed only atypical ductal hyperplasia (S [id number]).  Her subsequent history is as detailed below   PAST MEDICAL HISTORY: Past Medical History:  Diagnosis Date   Anemia    as a child   Anxiety    Cholelithiasis    with pregnancy   Elevated liver enzymes  during pregnancy March 2015   Family history of breast cancer    Family history of prostate cancer    GERD (gastroesophageal reflux disease)    one episode with pregnancy   History of chemotherapy    History of neuropathy    with chemo    History of radiation therapy 06/11/16- 07/23/16   Left Breast 50 Gy in 25 fractions, Left Breast boost 10 Gy in 5 fractions.    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 ([place]) 01/16/2016   Migraine    PONV (postoperative nausea and vomiting)     PAST SURGICAL HISTORY: Past Surgical History:  Procedure Laterality Date   BREAST LUMPECTOMY WITH RADIOACTIVE SEED AND SENTINEL LYMPH NODE BIOPSY Left 03/13/2016   Procedure: LEFT BREAST LUMPECTOMY WITH RADIOACTIVE SEED X 2 AND SENTINEL LYMPH NODE BIOPSY;  Surgeon: Stark Klein, MD;  Location: [place];  Service: General;  Laterality: Left;   NO PAST SURGERIES     PORT-A-CATH REMOVAL N/A 04/16/2017   Procedure: REMOVAL PORT-A-CATH;  Surgeon: Stark Klein, MD;  Location: [place];  Service: General;  Laterality: N/A;   PORTACATH PLACEMENT Right 03/13/2016   Procedure: INSERTION PORT-A-CATH;  Surgeon: Stark Klein, MD;  Location: [place];  Service: General;  Laterality: Right;   ROBOTIC ASSISTED SALPINGO OOPHERECTOMY Bilateral 02/02/2020   Procedure: XI ROBOTIC ASSISTED SALPINGO OOPHORECTOMY;  Surgeon: Lafonda Mosses, MD;  [facility];  Service: Gynecology;  Laterality: Bilateral;    FAMILY HISTORY: Family History  Problem Relation Age of Onset   Hypertension Mother    Hypertension Father    Prostate cancer Father    Liver cancer Maternal Grandmother    Breast cancer Paternal Grandmother        dx in her 65s   Lung cancer Paternal Aunt        smoker   Heart attack Paternal Grandfather    Liver cancer Other   The patient's parents are in their mid 62s as of November 2017. The patient father was diagnosed with prostate cancer at age 59. A paternal aunt was diagnosed with lung cancer in her late 46s. Also the paternal grandmother was diagnosed with breast cancer at the age of 26. On the mother's side there are 2 cases of liver cancer, age of  diagnosis unknown.   GYNECOLOGIC HISTORY:  No LMP recorded. (Menstrual status: Other).  Menarche age 56, first live birth age 51, she is [place] P2. She was still having periods at the time of breast cancer diagnosis.. She remotely used a number ring and oral contraceptives but stopped in 2016.more recently they used barrier methods for contraception.   SOCIAL HISTORY: (Updated June 2022). 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  She is now working her husband [name] works as a Buyer, retail. Their children are [name] age 45 and playing the cello in the school orchestra and [place] who is also doing very well in school. Myndi is not a church attender   ADVANCED DIRECTIVES: In the absence of any documents to the contrary the patient's husband is her healthcare power of attorney   HEALTH MAINTENANCE: Social History   Tobacco Use   Smoking status: Never   Smokeless tobacco: Never  Vaping Use   Vaping Use: Never used  Substance Use Topics   Alcohol use: No   Drug use: No     Colonoscopy:  PAP: 02/2019, abnormal  Bone density:   Allergies  Allergen Reactions   Amoxicillin Diarrhea and Nausea Only    Has patient had a PCN reaction causing immediate rash, facial/tongue/throat swelling, SOB or lightheadedness with hypotension: No Has patient had a PCN reaction causing severe rash involving mucus membranes or skin necrosis: No Has patient had a PCN reaction that required hospitalization No Has patient had a PCN reaction occurring within the last 10 years: Yes If all of the above answers are "NO", then may proceed with Cephalosporin use.     Current Outpatient Medications  Medication Sig Dispense Refill   acetaminophen (TYLENOL) 325 MG tablet Take 650 mg by mouth every 6 (six) hours as needed for moderate pain or headache.      amLODipine (NORVASC) 5 MG  tablet Take 5 mg by mouth daily.     ibuprofen (ADVIL) 800 MG tablet Take 1 tablet (800 mg total) by mouth every 8 (eight) hours as needed for moderate pain. For AFTER surgery only 30 tablet 0   Multiple Vitamins-Minerals (MULTIVITAMIN GUMMIES ADULT PO) Take 2 tablets by mouth daily.     NONFORMULARY OR COMPOUNDED ITEM Vitamin E vaginal suppositories 200u/ml.  One pv three times weekly. (Patient taking differently: Place 1 suppository vaginally 3 (three) times a week. Vitamin E vaginal suppositories 200u/ml.) 36 each 3   Omega-3 Fatty Acids (FISH OIL PO) Take 2 capsules by mouth daily.      tamoxifen (NOLVADEX) 20 MG tablet Take 1 tablet (20 mg total) by mouth daily. 90 tablet 3   venlafaxine XR (EFFEXOR-XR) 150 MG 24 hr capsule TAKE 1 CAPSULE BY MOUTH EVERY MORNING WITH BREAKFAST 90 capsule 4   No current facility-administered medications for this visit.    OBJECTIVE: African-American woman i who appears stated age  40:   08/23/20 1408 08/23/20 1409  BP: (!) 133/93 (!) 130/94  Pulse: 71   Resp: 18   Temp: 97.7 F (36.5 C)   SpO2: 98%      Body mass index is 30.95 kg/m.    ECOG FS:1 - Symptomatic but completely ambulatory   Sclerae unicteric, EOMs intact Wearing a mask No cervical or supraclavicular adenopathy Lungs no rales or rhonchi Heart regular rate and rhythm Abd soft, nontender, positive bowel sounds MSK no focal spinal tenderness, no upper extremity lymphedema Neuro: nonfocal, well oriented, appropriate affect Breasts: Left breast is status postlumpectomy and radiation there is no evidence of local recurrence per the right breast and both axillae are benign.   LAB RESULTS:  CMP     Component Value Date/Time   NA 140 08/23/2020 1403   NA 142 02/27/2017 1350   K 4.3 08/23/2020 1403   K 3.7 02/27/2017 1350   CL 104 08/23/2020 1403   CO2 29 08/23/2020 1403   CO2 28 02/27/2017 1350   GLUCOSE 81 08/23/2020 1403   GLUCOSE 124 02/27/2017 1350   BUN 11 08/23/2020  1403   BUN 8.9 02/27/2017 1350   CREATININE 0.88 08/23/2020 1403   CREATININE 0.79 07/31/2017 1349   CREATININE 0.8 02/27/2017 1350   CALCIUM 9.9 08/23/2020 1403   CALCIUM 9.5 02/27/2017 1350   PROT 7.9 08/23/2020 1403   PROT 7.3 02/27/2017 1350   ALBUMIN 3.8 08/23/2020 1403   ALBUMIN 3.8 02/27/2017 1350   AST 16 08/23/2020 1403   AST 24 07/31/2017 1349   AST 21 02/27/2017  1350   ALT 18 08/23/2020 1403   ALT 19 07/31/2017 1349   ALT 25 02/27/2017 1350   ALKPHOS 79 08/23/2020 1403   ALKPHOS 82 02/27/2017 1350   BILITOT <0.2 (L) 08/23/2020 1403   BILITOT <0.2 (L) 07/31/2017 1349   BILITOT 0.22 02/27/2017 1350   GFRNONAA >60 08/23/2020 1403   GFRNONAA >60 07/31/2017 1349   GFRAA >60 06/22/2019 1314   GFRAA >60 07/31/2017 1349    INo results found for: SPEP, UPEP  Lab Results  Component Value Date   WBC 5.8 08/23/2020   NEUTROABS 2.9 08/23/2020   HGB 12.6 08/23/2020   HCT 39.0 08/23/2020   MCV 85.0 08/23/2020   PLT 266 08/23/2020      Chemistry      Component Value Date/Time   NA 140 08/23/2020 1403   NA 142 02/27/2017 1350   K 4.3 08/23/2020 1403   K 3.7 02/27/2017 1350   CL 104 08/23/2020 1403   CO2 29 08/23/2020 1403   CO2 28 02/27/2017 1350   BUN 11 08/23/2020 1403   BUN 8.9 02/27/2017 1350   CREATININE 0.88 08/23/2020 1403   CREATININE 0.79 07/31/2017 1349   CREATININE 0.8 02/27/2017 1350      Component Value Date/Time   CALCIUM 9.9 08/23/2020 1403   CALCIUM 9.5 02/27/2017 1350   ALKPHOS 79 08/23/2020 1403   ALKPHOS 82 02/27/2017 1350   AST 16 08/23/2020 1403   AST 24 07/31/2017 1349   AST 21 02/27/2017 1350   ALT 18 08/23/2020 1403   ALT 19 07/31/2017 1349   ALT 25 02/27/2017 1350   BILITOT <0.2 (L) 08/23/2020 1403   BILITOT <0.2 (L) 07/31/2017 1349   BILITOT 0.22 02/27/2017 1350       No results found for: LABCA2  No components found for: LABCA125  No results for input(s): INR in the last 168 hours.  Urinalysis    Component Value  Date/Time   COLORURINE YELLOW 02/02/2020 0539   APPEARANCEUR CLEAR 02/02/2020 0539   LABSPEC 1.014 02/02/2020 0539   PHURINE 6.0 02/02/2020 0539   GLUCOSEU NEGATIVE 02/02/2020 0539   HGBUR NEGATIVE 02/02/2020 0539   BILIRUBINUR NEGATIVE 02/02/2020 0539   KETONESUR NEGATIVE 02/02/2020 0539   PROTEINUR NEGATIVE 02/02/2020 0539   UROBILINOGEN 0.2 10/14/2012 1954   NITRITE NEGATIVE 02/02/2020 0539   LEUKOCYTESUR SMALL (A) 02/02/2020 0539    STUDIES: No results found.   ELIGIBLE FOR AVAILABLE RESEARCH PROTOCOL: no   ASSESSMENT: 40 y.o. [place] woman status post left breast upper inner quadrant biopsy 01/15/2016 for a clinical T1c N0, stage IA invasive ductal carcinoma, E-cadherin positive, estrogen receptor 90% positive, progesterone receptor 40% positive, both with moderate staining intensity, with an MIB-1 of 5%, but HER-2 amplified, with a signals ratio of 2.41 and the number per cell 6.50.  (a) one of 2 areas of abnormal calcifications, the more posterior one measuring 0.2 cm, was also biopsied and showed only atypical ductal hyperplasia  (b) a 4.1 cm area of non-masslike enhancement in the left breast underwent biopsy 02/19/2016 showing only fibrocystic changes  (1) tamoxifen started 01/23/2016 neoadjuvantly  (2) genetics testing 02/22/2016 through the Breast/Ovarian gene panel offered by [facility] found no deleterious mutations in  ATM, BARD1, BRCA1, BRCA2, BRIP1, CDH1, CHEK2, EPCAM, FANCC, MLH1, MSH2, MSH6, NBN, PALB2, PMS2, PTEN, RAD51C, RAD51D, TP53, and XRCC2.     (3) status post left lumpectomy and sentinel lymph node sampling 03/13/2016 for an mpT1c pN0, stage IA invasive ductal carcinoma, grade 2, with negative  margins.  (4) paclitaxel weekly 12 and trastuzumab every 21 days beginning on 04/10/2016.  (a) paclitaxel discontinued after 5 doses because of atypical neuropathy symptoms. Last dose 04/28/2016  (5) trastuzumab continued to complete a year--last dose  04/10/2017  (a) echocardiogram 02/11/2016 shows ejection fraction in the 60-65% range.  (b) echo 05/28/2916 shows EF in the 65-70% range  (c) echo on 09/26/2016 shows LVEF of 55-60%  (d) echo on 01/14/2017 shows EF in the 60-65%  (6) adjuvant radiation 06/11/16 - 07/23/16  Site/dose:    1) Left Breast / 50 Gy in 25 fractions 2) Left Breast Boost / 10 Gy in 5 fractions  (7) tamoxifen resumed to 07/25/2016, completing 5 years December 2022  (8) patient resumed menses post chemo, April 2018  (a) goserelin started 06/17/2016, initially given every 28 days, changed to every 12 weeks on 11/12/16 with estradiol monitoring  (b) status post bilateral salpingo-oophorectomy 02/01/2021, with benign pathology   PLAN: [name] will be 5 years out from definitive surgery for her breast cancer at the end of this year.  She will complete tamoxifen at the same time.  Accordingly she is comfortable "graduating" from follow-up here.  All she will need in terms of breast cancer follow-up is her yearly mammography and yearly physician breast exam.  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  As a courtesy I am refilling her venlafaxine prescription.  She understands she cannot simply stop that medication but needs to slowly taper off.  If she needs instructions on how to do that at some point she will let us know.  We will be glad to see [name] again at a point in the future if and when the need arises but as of now are making no further routine appointments for her here.  Total encounter time 25 minutes.Sarajane Jews C. Arwen Haseley, MD  08/23/20 6:52 PM Medical Oncology and Hematology [facility] [place], [place] [address] Tel. [phone]    Fax. [phone]   I, Wilburn Mylar, am acting as scribe for Dr.  Virgie Dad. Dwayne Bulkley.  I, Lurline Del MD, have reviewed the above documentation for accuracy and completeness, and I agree with the above.   *Total Encounter Time as defined by the Centers for Medicare and Medicaid Services includes, in addition to the face-to-face time of a patient visit (documented in the note above) non-face-to-face time: obtaining and reviewing outside history, ordering and reviewing medications, tests or procedures, care coordination (communications with other health care professionals or caregivers) and documentation in the medical record.

## 2021-01-21 DIAGNOSIS — Z1231 Encounter for screening mammogram for malignant neoplasm of breast: Secondary | ICD-10-CM | POA: Diagnosis not present

## 2021-01-22 ENCOUNTER — Encounter: Payer: Self-pay | Admitting: Oncology

## 2021-03-13 ENCOUNTER — Other Ambulatory Visit: Payer: Self-pay

## 2021-03-13 ENCOUNTER — Encounter (HOSPITAL_BASED_OUTPATIENT_CLINIC_OR_DEPARTMENT_OTHER): Payer: Self-pay | Admitting: Obstetrics & Gynecology

## 2021-03-13 ENCOUNTER — Other Ambulatory Visit (HOSPITAL_COMMUNITY)
Admission: RE | Admit: 2021-03-13 | Discharge: 2021-03-13 | Disposition: A | Discharge status: Home / Self Care | Payer: BC Managed Care – PPO | Source: Ambulatory Visit | Attending: Obstetrics & Gynecology | Admitting: Obstetrics & Gynecology

## 2021-03-13 ENCOUNTER — Ambulatory Visit (HOSPITAL_BASED_OUTPATIENT_CLINIC_OR_DEPARTMENT_OTHER): Payer: BC Managed Care – PPO | Admitting: Obstetrics & Gynecology

## 2021-03-13 VITALS — BP 146/97 | HR 68 | Ht 67.0 in | Wt 199.2 lb

## 2021-03-13 DIAGNOSIS — N840 Polyp of corpus uteri: Secondary | ICD-10-CM | POA: Diagnosis not present

## 2021-03-13 DIAGNOSIS — N950 Postmenopausal bleeding: Secondary | ICD-10-CM | POA: Insufficient documentation

## 2021-03-13 DIAGNOSIS — N898 Other specified noninflammatory disorders of vagina: Secondary | ICD-10-CM

## 2021-03-13 DIAGNOSIS — Z9229 Personal history of other drug therapy: Secondary | ICD-10-CM | POA: Diagnosis not present

## 2021-03-13 NOTE — Progress Notes (Signed)
GYNECOLOGY  VISIT  CC:   vaginal discharge  HPI: 41 y.o. G59P2002 Married Other or two or more races female here for compliant of vaginal discharge that is pink tinged.  This has been present since she stopped her tamoxifen.  Reports has not been SA for >3 months.  She reports she and her husband co-sleep with their 41 year old so that gets in the way of intimacy.  Denies vaginal odor.     Patient Active Problem List   Diagnosis Date Noted   Pelvic mass in female 02/02/2020   Chronic benign neutropenia ([place]) 06/22/2019   Port-A-Cath in place 02/27/2017   Headache disorder 09/26/2016   Genetic testing 02/26/2016   Family history of breast cancer    Family history of prostate cancer    Malignant neoplasm of upper-inner quadrant of left breast in female, estrogen receptor positive ([place]) 01/16/2016    Past Medical History:  Diagnosis Date   Anemia    as a child   Anxiety    Cholelithiasis    with pregnancy   Elevated liver enzymes    during pregnancy March 2015   Family history of breast cancer    Family history of prostate cancer    GERD (gastroesophageal reflux disease)    one episode with pregnancy   History of chemotherapy    History of neuropathy    with chemo   History of radiation therapy 06/11/16- 07/23/16   Left Breast 50 Gy in 25 fractions, Left Breast boost 10 Gy in 5 fractions.    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 ([place]) 01/16/2016   Migraine    PONV (postoperative nausea and vomiting)     Past Surgical History:  Procedure Laterality Date   BREAST LUMPECTOMY WITH RADIOACTIVE SEED AND SENTINEL LYMPH NODE BIOPSY Left 03/13/2016   Procedure: LEFT BREAST LUMPECTOMY WITH RADIOACTIVE SEED X 2 AND SENTINEL LYMPH NODE BIOPSY;  Surgeon: Stark Klein, MD;  Location: [place];  Service: General;  Laterality: Left;   NO PAST SURGERIES     PORT-A-CATH REMOVAL N/A  04/16/2017   Procedure: REMOVAL PORT-A-CATH;  Surgeon: Stark Klein, MD;  Location: [place];  Service: General;  Laterality: N/A;   PORTACATH PLACEMENT Right 03/13/2016   Procedure: INSERTION PORT-A-CATH;  Surgeon: Stark Klein, MD;  Location: [place];  Service: General;  Laterality: Right;   ROBOTIC ASSISTED SALPINGO OOPHERECTOMY Bilateral 02/02/2020   Procedure: XI ROBOTIC ASSISTED SALPINGO OOPHORECTOMY;  Surgeon: Lafonda Mosses, MD;  [facility];  Service: Gynecology;  Laterality: Bilateral;    MEDS:   Current Outpatient Medications on File Prior to Visit  Medication Sig Dispense Refill   acetaminophen (TYLENOL) 325 MG tablet Take 650 mg by mouth every 6 (six) hours as needed for moderate pain or headache.      ibuprofen (ADVIL) 800 MG tablet Take 1 tablet (800 mg total) by mouth every 8 (eight) hours as needed for moderate pain. For AFTER surgery only 30 tablet 0   Multiple Vitamins-Minerals (MULTIVITAMIN GUMMIES ADULT PO) Take 2 tablets by mouth daily.     Omega-3 Fatty Acids (FISH OIL PO) Take 2 capsules by mouth daily.      venlafaxine XR (EFFEXOR-XR) 150 MG 24 hr capsule TAKE 1 CAPSULE BY MOUTH EVERY MORNING WITH BREAKFAST 90 capsule 4   amLODipine (NORVASC) 5 MG tablet Take 5 mg by  mouth daily. (Patient not taking: Reported on 03/13/2021)     NONFORMULARY OR COMPOUNDED ITEM Vitamin E vaginal suppositories 200u/ml.  One pv three times weekly. (Patient not taking: Reported on 03/13/2021) 36 each 3   tamoxifen (NOLVADEX) 20 MG tablet Take 1 tablet (20 mg total) by mouth daily. (Patient not taking: Reported on 03/13/2021) 90 tablet 3   [DISCONTINUED] prochlorperazine (COMPAZINE) 10 MG tablet Take 1 tablet (10 mg total) by mouth every 6 (six) hours as needed (Nausea or vomiting). (Patient not taking: Reported on 06/03/2016) 30 tablet 1   No current facility-administered medications on file prior to visit.    ALLERGIES: Amoxicillin  Family History  Problem Relation Age of Onset    Hypertension Mother    Hypertension Father    Prostate cancer Father    Liver cancer Maternal Grandmother    Breast cancer Paternal Grandmother        dx in her 71s   Lung cancer Paternal Aunt        smoker   Heart attack Paternal Grandfather    Liver cancer Other     SH:  married, non smoker  Review of Systems  All other systems reviewed and are negative.  PHYSICAL EXAMINATION:    BP (!) 146/97 (BP Location: Right Arm, Patient Position: Sitting, Cuff Size: Large)    Pulse 68    Ht 5\' 7"  (1.702 m) Comment: reported   Wt 199 lb 3.2 oz (90.4 kg)    BMI 31.20 kg/m     General appearance: alert, cooperative and appears stated age Abdomen: soft, non-tender; bowel sounds normal; no masses,  no organomegaly Lymph:  no inguinal LAD noted  Pelvic: External genitalia:  no lesions              Urethra:  normal appearing urethra with no masses, tenderness or lesions              Bartholins and Skenes: normal                 Vagina: normal appearing vagina with watery and pink vaginal discharge              Cervix: no lesions              Bimanual Exam:  Uterus:  normal size, contour, position, consistency, mobility, non-tender              Adnexa: no mass, fullness, tenderness  Due to tamoxifen use, endometrial biopsy recommended.  Discussed with patient.  Verbal and written consent obtained.   Procedure:  Speculum placed.  Cervix visualized and cleansed with betadine prep.  A single toothed tenaculum was applied to the anterior lip of the cervix.  Endometrial pipelle was advanced through the cervix into the endometrial cavity without difficulty.  Pipelle passed to 7cm.  Suction applied and pipelle removed with good tissue sample obtained.  Tenculum removed.  No bleeding noted.  Patient tolerated procedure well.  Chaperone, Octaviano Batty, CMA, was present for exam.  Assessment/Plan: 1. Vaginal discharge - Cervicovaginal ancillary only( [place])  2. Postmenopausal bleeding - Cytology  - PAP( [place]) - Surgical pathology( [place]/ [facility])  - additional evaluation/treatment recommendations will be made once all tests are finalized.

## 2021-03-14 LAB — CERVICOVAGINAL ANCILLARY ONLY
Bacterial Vaginitis (gardnerella): NEGATIVE
Candida Glabrata: NEGATIVE
Candida Vaginitis: NEGATIVE
Comment: NEGATIVE
Comment: NEGATIVE
Comment: NEGATIVE

## 2021-03-15 LAB — CYTOLOGY - PAP: Diagnosis: NEGATIVE

## 2021-03-15 LAB — SURGICAL PATHOLOGY

## 2021-03-16 ENCOUNTER — Encounter (HOSPITAL_BASED_OUTPATIENT_CLINIC_OR_DEPARTMENT_OTHER): Payer: Self-pay | Admitting: Obstetrics & Gynecology

## 2021-03-20 ENCOUNTER — Other Ambulatory Visit (HOSPITAL_BASED_OUTPATIENT_CLINIC_OR_DEPARTMENT_OTHER): Payer: Self-pay | Admitting: Obstetrics & Gynecology

## 2021-03-20 DIAGNOSIS — R3989 Other symptoms and signs involving the genitourinary system: Secondary | ICD-10-CM

## 2021-03-20 DIAGNOSIS — Z79899 Other long term (current) drug therapy: Secondary | ICD-10-CM

## 2021-03-20 NOTE — Progress Notes (Signed)
Ultrasound order placed. 

## 2021-03-27 ENCOUNTER — Ambulatory Visit (HOSPITAL_BASED_OUTPATIENT_CLINIC_OR_DEPARTMENT_OTHER): Payer: BC Managed Care – PPO | Admitting: Obstetrics & Gynecology

## 2021-03-27 ENCOUNTER — Other Ambulatory Visit (HOSPITAL_BASED_OUTPATIENT_CLINIC_OR_DEPARTMENT_OTHER): Payer: BC Managed Care – PPO

## 2021-04-03 ENCOUNTER — Other Ambulatory Visit: Payer: Self-pay

## 2021-04-03 ENCOUNTER — Ambulatory Visit (INDEPENDENT_AMBULATORY_CARE_PROVIDER_SITE_OTHER): Payer: BC Managed Care – PPO

## 2021-04-03 DIAGNOSIS — Z79899 Other long term (current) drug therapy: Secondary | ICD-10-CM | POA: Diagnosis not present

## 2021-04-03 DIAGNOSIS — D259 Leiomyoma of uterus, unspecified: Secondary | ICD-10-CM | POA: Diagnosis not present

## 2021-04-03 DIAGNOSIS — N840 Polyp of corpus uteri: Secondary | ICD-10-CM

## 2021-04-03 DIAGNOSIS — R3989 Other symptoms and signs involving the genitourinary system: Secondary | ICD-10-CM

## 2021-04-04 ENCOUNTER — Telehealth (HOSPITAL_BASED_OUTPATIENT_CLINIC_OR_DEPARTMENT_OTHER): Payer: BC Managed Care – PPO | Admitting: Obstetrics & Gynecology

## 2021-04-04 ENCOUNTER — Encounter (HOSPITAL_BASED_OUTPATIENT_CLINIC_OR_DEPARTMENT_OTHER): Payer: Self-pay | Admitting: Obstetrics & Gynecology

## 2021-04-15 ENCOUNTER — Encounter (HOSPITAL_BASED_OUTPATIENT_CLINIC_OR_DEPARTMENT_OTHER): Payer: Self-pay

## 2021-04-26 ENCOUNTER — Other Ambulatory Visit (HOSPITAL_BASED_OUTPATIENT_CLINIC_OR_DEPARTMENT_OTHER): Payer: Self-pay | Admitting: Obstetrics & Gynecology

## 2021-04-26 DIAGNOSIS — R935 Abnormal findings on diagnostic imaging of other abdominal regions, including retroperitoneum: Secondary | ICD-10-CM

## 2021-05-30 ENCOUNTER — Ambulatory Visit (INDEPENDENT_AMBULATORY_CARE_PROVIDER_SITE_OTHER): Payer: BC Managed Care – PPO | Admitting: Obstetrics & Gynecology

## 2021-05-30 ENCOUNTER — Encounter (HOSPITAL_BASED_OUTPATIENT_CLINIC_OR_DEPARTMENT_OTHER): Payer: Self-pay | Admitting: Obstetrics & Gynecology

## 2021-05-30 VITALS — BP 126/89 | HR 73 | Ht 67.0 in | Wt 198.8 lb

## 2021-05-30 DIAGNOSIS — Z170 Estrogen receptor positive status [ER+]: Secondary | ICD-10-CM

## 2021-05-30 DIAGNOSIS — R935 Abnormal findings on diagnostic imaging of other abdominal regions, including retroperitoneum: Secondary | ICD-10-CM

## 2021-05-30 DIAGNOSIS — Z9079 Acquired absence of other genital organ(s): Secondary | ICD-10-CM

## 2021-05-30 DIAGNOSIS — C50212 Malignant neoplasm of upper-inner quadrant of left female breast: Secondary | ICD-10-CM

## 2021-05-30 DIAGNOSIS — R3989 Other symptoms and signs involving the genitourinary system: Secondary | ICD-10-CM

## 2021-05-30 NOTE — Progress Notes (Signed)
41 y.o. G95P2002 Married AA female here for discussion of upcoming procedure.  Hysteroscopy with polyp resection, D&C planned due to abnormal endometrium and abnormal vaginal discharge.  Pt was seen on 03/13/2021 for this complaint.  Vaginitis testing was obtained which was negative.  Endometrial biopsy and pap smear obtained.  Biopsy showed polypoid fragments of inactive appearing endometrium.  Because biopsy was negative, ultrasound was obtained.  Endometrium is 2.9 - 3.0cm and has cystic spaces.  This is consistent with an endometrial polyp(s).  Additional evaluation with hysteroscopy recommended including removal of endometrial lesion and D&C. ? ?Procedure discussed with patient.  Hospital stay, recovery and pain management all discussed.  Risks discussed including but not limited to bleeding, 1% risk of receiving a  transfusion, infection, 3-4% risk of bowel/bladder/ureteral/vascular injury discussed as well as possible need for additional surgery if injury does occur discussed.  DVT/PE and rare risk of death discussed.  My actual complications with prior surgeries discussed.  Vaginal cuff dehiscence discussed.  Hernia formation discussed.  Positioning and incision locations discussed.  Patient aware if pathology abnormal she may need additional treatment.  All questions answered.    ? ?Ob Hx:   ?No LMP recorded. (Menstrual status: Other).          ?Birth control:  h/o BSO ?Last pap: 03/13/2021 ?Last MMG: 07/04/2020 ?Smoking: No  ? ?Past Surgical History:  ?Procedure Laterality Date  ? BREAST LUMPECTOMY WITH RADIOACTIVE SEED AND SENTINEL LYMPH NODE BIOPSY Left 03/13/2016  ? Procedure: LEFT BREAST LUMPECTOMY WITH RADIOACTIVE SEED X 2 AND SENTINEL LYMPH NODE BIOPSY;  Surgeon: Stark Klein, MD;  Location: [facility];  Service: General;  Laterality: Left;  ? PORT-A-CATH REMOVAL N/A 04/16/2017  ? Procedure: REMOVAL PORT-A-CATH;  Surgeon: Stark Klein, MD;  Location: [place];  Service: General;  Laterality: N/A;  ? PORTACATH  PLACEMENT Right 03/13/2016  ? Procedure: INSERTION PORT-A-CATH;  Surgeon: Stark Klein, MD;  Location: [facility];  Service: General;  Laterality: Right;  ? ROBOTIC ASSISTED SALPINGO OOPHERECTOMY Bilateral 02/02/2020  ? Procedure: XI ROBOTIC ASSISTED SALPINGO OOPHORECTOMY;  Surgeon: Lafonda Mosses, MD;  Location: [facility];  Service: Gynecology;  Laterality: Bilateral;  ? ? ?Past Medical History:  ?Diagnosis Date  ? Anemia   ? as a child  ? Anxiety   ? Cholelithiasis   ? with pregnancy  ? Elevated liver enzymes   ? during pregnancy March 2015  ? Family history of breast cancer   ? Family history of prostate cancer   ? GERD (gastroesophageal reflux disease)   ? one episode with pregnancy  ? History of chemotherapy   ? History of neuropathy   ? with chemo  ? History of radiation therapy 06/11/16- 07/23/16  ? Left Breast 50 Gy in 25 fractions, Left Breast boost 10 Gy in 5 fractions.   ? Hypertension   ? Hypothyroidism   ? with pregnancy  ? Lymphedema of arm   ? LEFT  ? 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 Migraine   ? PONV (postoperative nausea and vomiting)   ? ? ?Allergies: Amoxicillin ? ?Current Outpatient Medications  ?Medication Sig Dispense Refill  ? acetaminophen (TYLENOL) 325 MG tablet Take 650 mg by mouth every 6 (six) hours as needed for moderate pain or headache.     ? ibuprofen (ADVIL) 800 MG tablet Take 1 tablet (800 mg total) by mouth every 8 (eight) hours as needed for moderate pain. For AFTER surgery only 30 tablet 0  ?  Multiple Vitamins-Minerals (MULTIVITAMIN GUMMIES ADULT PO) Take 2 tablets by mouth daily.    ? Omega-3 Fatty Acids (FISH OIL PO) Take 2 capsules by mouth daily.     ? venlafaxine XR (EFFEXOR-XR) 150 MG 24 hr capsule TAKE 1 CAPSULE BY MOUTH EVERY MORNING WITH BREAKFAST 90 capsule 4  ? ?No current facility-administered medications for this visit.  ? ? ?ROS: Pertinent items noted in HPI and remainder of comprehensive ROS otherwise  negative. ? ?Exam:   ?BP 126/89 (BP Location: Right Arm, Patient Position: Sitting, Cuff Size: Large)   Pulse 73   Ht '5\' 7"'$  (1.702 m) Comment: reported  Wt 198 lb 12.8 oz (90.2 kg)   BMI 31.14 kg/m?   ?General appearance: alert and cooperative ?Head: Normocephalic, without obvious abnormality, atraumatic ?Lungs: clear to auscultation bilaterally ?Heart: regular rate and rhythm, S1, S2 normal, no murmur, click, rub or gallop ?Neurologic: Grossly normal ? ? ?Assessment/Plan: ?1. Abnormal ultrasound of endometrium ?- hysteroscopy with probable polyp resection, D&C planned ?- Pre and post op instructions reviewed.  Post op pain medications reviewed. ? ?2. History of bilateral salpingectomy ? ?3. Abnormal urogenital discharge ? ?4. Malignant neoplasm of upper-inner quadrant of left breast in female, estrogen receptor positive ([place]) ? ? ? ? ?

## 2021-06-03 ENCOUNTER — Encounter (HOSPITAL_BASED_OUTPATIENT_CLINIC_OR_DEPARTMENT_OTHER): Payer: Self-pay | Admitting: Obstetrics & Gynecology

## 2021-06-04 ENCOUNTER — Encounter (HOSPITAL_BASED_OUTPATIENT_CLINIC_OR_DEPARTMENT_OTHER): Payer: Self-pay | Admitting: Obstetrics & Gynecology

## 2021-06-05 ENCOUNTER — Encounter (HOSPITAL_BASED_OUTPATIENT_CLINIC_OR_DEPARTMENT_OTHER): Payer: Self-pay | Admitting: Obstetrics & Gynecology

## 2021-06-06 NOTE — Progress Notes (Signed)
Reviewed patient medical history and anesthesia record from 02-01-2022 surgery at [facility] with dr c Christella Hartigan mda, pt ok for 06-11-2021 surgery at [facility] per dr c Christella Hartigan, mda. ?

## 2021-06-07 ENCOUNTER — Encounter (HOSPITAL_COMMUNITY)
Admission: RE | Admit: 2021-06-07 | Discharge: 2021-06-07 | Disposition: A | Discharge status: Home / Self Care | Payer: BC Managed Care – PPO | Source: Ambulatory Visit | Attending: Obstetrics & Gynecology | Admitting: Obstetrics & Gynecology

## 2021-06-07 DIAGNOSIS — R935 Abnormal findings on diagnostic imaging of other abdominal regions, including retroperitoneum: Secondary | ICD-10-CM | POA: Diagnosis not present

## 2021-06-07 DIAGNOSIS — Z01818 Encounter for other preprocedural examination: Secondary | ICD-10-CM | POA: Diagnosis not present

## 2021-06-07 LAB — CBC
HCT: 41.2 % (ref 36.0–46.0)
Hemoglobin: 12.6 g/dL (ref 12.0–15.0)
MCH: 26.7 pg (ref 26.0–34.0)
MCHC: 30.6 g/dL (ref 30.0–36.0)
MCV: 87.3 fL (ref 80.0–100.0)
Platelets: 196 10*3/uL (ref 150–400)
RBC: 4.72 MIL/uL (ref 3.87–5.11)
RDW: 12.8 % (ref 11.5–15.5)
WBC: 5.4 10*3/uL (ref 4.0–10.5)
nRBC: 0 % (ref 0.0–0.2)

## 2021-06-07 LAB — BASIC METABOLIC PANEL
Anion gap: 5 (ref 5–15)
BUN: 10 mg/dL (ref 6–20)
CO2: 28 mmol/L (ref 22–32)
Calcium: 8.9 mg/dL (ref 8.9–10.3)
Chloride: 106 mmol/L (ref 98–111)
Creatinine, Ser: 0.72 mg/dL (ref 0.44–1.00)
GFR, Estimated: >60 mL/min (ref >60)
Glucose, Bld: 81 mg/dL (ref 70–99)
Potassium: 3.6 mmol/L (ref 3.5–5.1)
Sodium: 139 mmol/L (ref 135–145)

## 2021-06-10 ENCOUNTER — Other Ambulatory Visit: Payer: Self-pay

## 2021-06-10 ENCOUNTER — Encounter (HOSPITAL_BASED_OUTPATIENT_CLINIC_OR_DEPARTMENT_OTHER): Payer: Self-pay | Admitting: Obstetrics & Gynecology

## 2021-06-10 DIAGNOSIS — Z713 Dietary counseling and surveillance: Secondary | ICD-10-CM | POA: Diagnosis not present

## 2021-06-10 DIAGNOSIS — Z131 Encounter for screening for diabetes mellitus: Secondary | ICD-10-CM | POA: Diagnosis not present

## 2021-06-10 DIAGNOSIS — Z13220 Encounter for screening for lipoid disorders: Secondary | ICD-10-CM | POA: Diagnosis not present

## 2021-06-10 DIAGNOSIS — Z136 Encounter for screening for cardiovascular disorders: Secondary | ICD-10-CM | POA: Diagnosis not present

## 2021-06-10 DIAGNOSIS — Z0130 Encounter for examination of blood pressure without abnormal findings: Secondary | ICD-10-CM | POA: Diagnosis not present

## 2021-06-10 NOTE — Progress Notes (Signed)
Spoke w/ via phone for pre-op interview--- pt ?Lab needs dos----   urine preg     ?Lab results------ pt had lab work done 06-07-2021 results in epic, CBC/ BMP/EKG ?COVID test -----patient states asymptomatic no test needed ?Arrive at -------  0845 on 06-11-2021 ?NPO after MN NO Solid Food.  Clear liquids from MN until--- 0745 ?Med rec completed ?Medications to take morning of surgery ----- effexor ?Diabetic medication ----- n/a ?Patient instructed no nail polish to be worn day of surgery ?Patient instructed to bring photo id and insurance card day of surgery ?Patient aware to have Driver (ride ) / caregiver for 24 hours after surgery --husband, joe ?Patient Special Instructions ----- n/a ?Pre-Op special Istructions ----- n/a ?Patient verbalized understanding of instructions that were given at this phone interview. ?Patient denies shortness of breath, chest pain, fever, cough at this phone interview.  ?

## 2021-06-11 ENCOUNTER — Ambulatory Visit (HOSPITAL_BASED_OUTPATIENT_CLINIC_OR_DEPARTMENT_OTHER): Payer: BC Managed Care – PPO | Admitting: Anesthesiology

## 2021-06-11 ENCOUNTER — Other Ambulatory Visit: Payer: Self-pay

## 2021-06-11 ENCOUNTER — Encounter (HOSPITAL_BASED_OUTPATIENT_CLINIC_OR_DEPARTMENT_OTHER)
Admission: RE | Disposition: A | Discharge status: Home / Self Care | Payer: Self-pay | Source: Home / Self Care | Attending: Obstetrics & Gynecology

## 2021-06-11 ENCOUNTER — Ambulatory Visit (HOSPITAL_BASED_OUTPATIENT_CLINIC_OR_DEPARTMENT_OTHER)
Admission: RE | Admit: 2021-06-11 | Discharge: 2021-06-11 | Disposition: A | Discharge status: Home / Self Care | Payer: BC Managed Care – PPO | Attending: Obstetrics & Gynecology | Admitting: Obstetrics & Gynecology

## 2021-06-11 ENCOUNTER — Encounter (HOSPITAL_BASED_OUTPATIENT_CLINIC_OR_DEPARTMENT_OTHER): Payer: Self-pay | Admitting: Obstetrics & Gynecology

## 2021-06-11 DIAGNOSIS — N939 Abnormal uterine and vaginal bleeding, unspecified: Secondary | ICD-10-CM | POA: Diagnosis not present

## 2021-06-11 DIAGNOSIS — Z803 Family history of malignant neoplasm of breast: Secondary | ICD-10-CM | POA: Insufficient documentation

## 2021-06-11 DIAGNOSIS — N8501 Benign endometrial hyperplasia: Secondary | ICD-10-CM

## 2021-06-11 DIAGNOSIS — F419 Anxiety disorder, unspecified: Secondary | ICD-10-CM | POA: Insufficient documentation

## 2021-06-11 DIAGNOSIS — Z853 Personal history of malignant neoplasm of breast: Secondary | ICD-10-CM | POA: Insufficient documentation

## 2021-06-11 DIAGNOSIS — N898 Other specified noninflammatory disorders of vagina: Secondary | ICD-10-CM | POA: Diagnosis not present

## 2021-06-11 DIAGNOSIS — Z01818 Encounter for other preprocedural examination: Secondary | ICD-10-CM

## 2021-06-11 DIAGNOSIS — R935 Abnormal findings on diagnostic imaging of other abdominal regions, including retroperitoneum: Secondary | ICD-10-CM

## 2021-06-11 DIAGNOSIS — N950 Postmenopausal bleeding: Secondary | ICD-10-CM | POA: Diagnosis not present

## 2021-06-11 HISTORY — DX: Presence of spectacles and contact lenses: Z97.3

## 2021-06-11 HISTORY — DX: Personal history of other diseases of the circulatory system: Z86.79

## 2021-06-11 HISTORY — DX: Personal history of other endocrine, nutritional and metabolic disease: Z86.39

## 2021-06-11 HISTORY — DX: Other complications of anesthesia, initial encounter: T88.59XA

## 2021-06-11 HISTORY — DX: Dermatitis, unspecified: L30.9

## 2021-06-11 HISTORY — DX: Abnormal findings on diagnostic imaging of other abdominal regions, including retroperitoneum: R93.5

## 2021-06-11 HISTORY — PX: HYSTEROSCOPY WITH D & C: SHX1775

## 2021-06-11 LAB — POCT PREGNANCY, URINE: Preg Test, Ur: NEGATIVE

## 2021-06-11 SURGERY — DILATATION AND CURETTAGE /HYSTEROSCOPY
Anesthesia: General

## 2021-06-11 MED ORDER — PROPOFOL 10 MG/ML IV BOLUS
INTRAVENOUS | Status: AC
Start: 2021-06-11 — End: ?
  Filled 2021-06-11: qty 20

## 2021-06-11 MED ORDER — LIDOCAINE-EPINEPHRINE 1 %-1:100000 IJ SOLN
INTRAMUSCULAR | Status: DC | PRN
  Administered 2021-06-11: 10 mL

## 2021-06-11 MED ORDER — KETOROLAC TROMETHAMINE 30 MG/ML IJ SOLN
INTRAMUSCULAR | Status: DC | PRN
  Administered 2021-06-11: 30 mg via INTRAVENOUS

## 2021-06-11 MED ORDER — LACTATED RINGERS IV SOLN
INTRAVENOUS | Status: DC
  Administered 2021-06-11: 1000 mL via INTRAVENOUS

## 2021-06-11 MED ORDER — FENTANYL CITRATE (PF) 100 MCG/2ML IJ SOLN
INTRAMUSCULAR | Status: DC | PRN
  Administered 2021-06-11 (×2): 50 ug via INTRAVENOUS

## 2021-06-11 MED ORDER — FENTANYL CITRATE (PF) 100 MCG/2ML IJ SOLN
INTRAMUSCULAR | Status: AC
  Filled 2021-06-11: qty 2

## 2021-06-11 MED ORDER — ACETAMINOPHEN 10 MG/ML IV SOLN: 1000.0000 mg | Freq: Once | INTRAVENOUS | Status: DC | PRN

## 2021-06-11 MED ORDER — LIDOCAINE 2% (20 MG/ML) 5 ML SYRINGE
INTRAMUSCULAR | Status: DC | PRN
  Administered 2021-06-11: 60 mg via INTRAVENOUS

## 2021-06-11 MED ORDER — SCOPOLAMINE 1 MG/3DAYS TD PT72
MEDICATED_PATCH | TRANSDERMAL | Status: DC | PRN
  Administered 2021-06-11: 1 via TRANSDERMAL

## 2021-06-11 MED ORDER — KETOROLAC TROMETHAMINE 30 MG/ML IJ SOLN
INTRAMUSCULAR | Status: AC
  Filled 2021-06-11: qty 1

## 2021-06-11 MED ORDER — LIDOCAINE HCL (PF) 2 % IJ SOLN
INTRAMUSCULAR | Status: AC
  Filled 2021-06-11: qty 5

## 2021-06-11 MED ORDER — ONDANSETRON HCL 4 MG/2ML IJ SOLN
INTRAMUSCULAR | Status: DC | PRN
  Administered 2021-06-11: 4 mg via INTRAVENOUS

## 2021-06-11 MED ORDER — SCOPOLAMINE 1 MG/3DAYS TD PT72
MEDICATED_PATCH | TRANSDERMAL | Status: AC
  Filled 2021-06-11: qty 1

## 2021-06-11 MED ORDER — ONDANSETRON HCL 4 MG/2ML IJ SOLN
INTRAMUSCULAR | Status: AC
  Filled 2021-06-11: qty 2

## 2021-06-11 MED ORDER — AMISULPRIDE (ANTIEMETIC) 5 MG/2ML IV SOLN
INTRAVENOUS | Status: AC
  Filled 2021-06-11: qty 4

## 2021-06-11 MED ORDER — IBUPROFEN 800 MG PO TABS: 800.0000 mg | ORAL_TABLET | Freq: Three times a day (TID) | ORAL | 0 refills | Status: DC | PRN

## 2021-06-11 MED ORDER — PROPOFOL 10 MG/ML IV BOLUS
INTRAVENOUS | Status: DC | PRN
  Administered 2021-06-11: 170 mg via INTRAVENOUS

## 2021-06-11 MED ORDER — AMISULPRIDE (ANTIEMETIC) 5 MG/2ML IV SOLN
10.0000 mg | Freq: Once | INTRAVENOUS | Status: AC | PRN
  Administered 2021-06-11: 10 mg via INTRAVENOUS

## 2021-06-11 MED ORDER — FENTANYL CITRATE (PF) 100 MCG/2ML IJ SOLN: 25.0000 ug | INTRAMUSCULAR | Status: DC | PRN

## 2021-06-11 MED ORDER — HYDROCODONE-ACETAMINOPHEN 5-325 MG PO TABS: 1.0000 | ORAL_TABLET | Freq: Four times a day (QID) | ORAL | 0 refills | Status: DC | PRN

## 2021-06-11 MED ORDER — DEXAMETHASONE SODIUM PHOSPHATE 10 MG/ML IJ SOLN
INTRAMUSCULAR | Status: DC | PRN
  Administered 2021-06-11: 10 mg via INTRAVENOUS

## 2021-06-11 MED ORDER — SODIUM CHLORIDE 0.9 % IR SOLN
Status: DC | PRN
  Administered 2021-06-11: 3000 mL

## 2021-06-11 MED ORDER — SILVER NITRATE-POT NITRATE 75-25 % EX MISC
CUTANEOUS | Status: DC | PRN
  Administered 2021-06-11: 2

## 2021-06-11 MED ORDER — MIDAZOLAM HCL 2 MG/2ML IJ SOLN
INTRAMUSCULAR | Status: AC
Start: 2021-06-11 — End: ?
  Filled 2021-06-11: qty 2

## 2021-06-11 MED ORDER — PROPOFOL 10 MG/ML IV BOLUS
INTRAVENOUS | Status: AC
  Filled 2021-06-11: qty 20

## 2021-06-11 MED ORDER — MIDAZOLAM HCL 2 MG/2ML IJ SOLN
INTRAMUSCULAR | Status: DC | PRN
  Administered 2021-06-11: 2 mg via INTRAVENOUS

## 2021-06-11 MED ORDER — DEXAMETHASONE SODIUM PHOSPHATE 10 MG/ML IJ SOLN
INTRAMUSCULAR | Status: AC
Start: 2021-06-11 — End: ?
  Filled 2021-06-11: qty 1

## 2021-06-11 MED ORDER — POVIDONE-IODINE 10 % EX SWAB: 2.0000 "application " | Freq: Once | CUTANEOUS | Status: DC

## 2021-06-11 SURGICAL SUPPLY — 22 items
BIPOLAR CUTTING LOOP 21FR (ELECTRODE)
CATH ROBINSON RED A/P 16FR (CATHETERS) ×1 IMPLANT
DEVICE MYOSURE LITE (MISCELLANEOUS) IMPLANT
DEVICE MYOSURE REACH (MISCELLANEOUS) IMPLANT
DILATOR CANAL MILEX (MISCELLANEOUS) IMPLANT
DRSG TELFA 3X8 NADH (GAUZE/BANDAGES/DRESSINGS) ×2 IMPLANT
GAUZE 4X4 16PLY ~~LOC~~+RFID DBL (SPONGE) ×4 IMPLANT
GLOVE BIOGEL PI IND STRL 7.0 (GLOVE) ×1 IMPLANT
GLOVE BIOGEL PI INDICATOR 7.0 (GLOVE) ×1
GLOVE ECLIPSE 6.5 STRL STRAW (GLOVE) ×4 IMPLANT
GOWN STRL REUS W/TWL LRG LVL3 (GOWN DISPOSABLE) ×4 IMPLANT
IV NS IRRIG 3000ML ARTHROMATIC (IV SOLUTION) ×2 IMPLANT
KIT PROCEDURE FLUENT (KITS) ×2 IMPLANT
KIT TURNOVER CYSTO (KITS) ×2 IMPLANT
LOOP CUTTING BIPOLAR 21FR (ELECTRODE) IMPLANT
PACK VAGINAL MINOR WOMEN LF (CUSTOM PROCEDURE TRAY) ×2 IMPLANT
PAD DRESSING TELFA 3X8 NADH (GAUZE/BANDAGES/DRESSINGS) ×1 IMPLANT
PAD OB MATERNITY 4.3X12.25 (PERSONAL CARE ITEMS) ×2 IMPLANT
SEAL CERVICAL OMNI LOK (ABLATOR) IMPLANT
SEAL ROD LENS SCOPE MYOSURE (ABLATOR) ×2 IMPLANT
TOWEL OR 17X26 10 PK STRL BLUE (TOWEL DISPOSABLE) ×2 IMPLANT
WATER STERILE IRR 500ML POUR (IV SOLUTION) ×2 IMPLANT

## 2021-06-11 NOTE — Anesthesia Preprocedure Evaluation (Addendum)
Anesthesia Evaluation  ?Patient identified by MRN, date of birth, ID band ?Patient awake ? ? ? ?Reviewed: ?Allergy & Precautions, NPO status , Patient's Chart, lab work & pertinent test results ? ?History of Anesthesia Complications ?(+) PONV and history of anesthetic complications ? ?Airway ?Mallampati: II ? ?TM Distance: >3 FB ?Neck ROM: Full ? ? ? Dental ?no notable dental hx. ? ?  ?Pulmonary ?neg pulmonary ROS,  ?  ?Pulmonary exam normal ? ? ? ? ? ? ? Cardiovascular ?negative cardio ROS ? ? ?Rhythm:Regular Rate:Normal ? ? ?  ?Neuro/Psych ? Headaches, Anxiety   ? GI/Hepatic ?negative GI ROS, Neg liver ROS,   ?Endo/Other  ?negative endocrine ROS ? Renal/GU ?negative Renal ROS  ?Female GU complaint ?AUB ? ?  ?Musculoskeletal ?Breast Ca  ? Abdominal ?Normal abdominal exam  (+)   ?Peds ? Hematology ?negative hematology ROS ?(+)   ?Anesthesia Other Findings ? ? Reproductive/Obstetrics ? ?  ? ? ? ? ? ? ? ? ? ? ? ? ? ?  ?  ? ? ? ? ? ? ? ? ?Anesthesia Physical ?Anesthesia Plan ? ?ASA: 2 ? ?Anesthesia Plan: General  ? ?Post-op Pain Management:   ? ?Induction: Intravenous ? ?PONV Risk Score and Plan: 4 or greater and Ondansetron, Dexamethasone, Midazolam and Treatment may vary due to age or medical condition ? ?Airway Management Planned: Mask and LMA ? ?Additional Equipment: None ? ?Intra-op Plan:  ? ?Post-operative Plan: Extubation in OR ? ?Informed Consent: I have reviewed the patients History and Physical, chart, labs and discussed the procedure including the risks, benefits and alternatives for the proposed anesthesia with the patient or authorized representative who has indicated his/her understanding and acceptance.  ? ? ? ?Dental advisory given ? ?Plan Discussed with: CRNA ? ?Anesthesia Plan Comments:   ? ? ? ? ? ? ?Anesthesia Quick Evaluation ? ?

## 2021-06-11 NOTE — Transfer of Care (Signed)
Immediate Anesthesia Transfer of Care Note ? ?Patient: Virginia [name] ? ?Procedure(s) Performed: Procedure(s) (LRB): ?DILATATION AND CURETTAGE /HYSTEROSCOPY (N/A) ? ?Patient Location: PACU ? ?Anesthesia Type: General ? ?Level of Consciousness: awake, alert  and oriented ? ?Airway & Oxygen Therapy: Patient Spontanous Breathing  ? ?Post-op Assessment: Report given to PACU RN and Post -op Vital signs reviewed and stable ? ?Post vital signs: Reviewed and stable ? ?Complications: No apparent anesthesia complications ? ?Last Vitals:  ?Vitals Value Taken Time  ?BP 135/92 06/11/21 1145  ?Temp 36.4 ?C 06/11/21 1135  ?Pulse 92 06/11/21 1149  ?Resp 13 06/11/21 1149  ?SpO2 98 % 06/11/21 1149  ?Vitals shown include unvalidated device data. ? ?Last Pain:  ?Vitals:  ? 06/11/21 1135  ?TempSrc:   ?PainSc: 0-No pain  ?   ? ?Patients Stated Pain Goal: 7 (06/11/21 1642) ? ?Complications: No notable events documented. ?

## 2021-06-11 NOTE — Discharge Instructions (Addendum)
Post-surgical Instructions, Outpatient Surgery ? ?You may expect to feel dizzy, weak, and drowsy for as long as 24 hours after receiving the medicine that made you sleep (anesthetic). For the first 24 hours after your surgery:   ?Do not drive a car, ride a bicycle, participate in physical activities, or take public transportation until you are done taking narcotic pain medicines or as directed by Dr. Sabra Heck.  ?Do not drink alcohol or take tranquilizers.  ?Do not take medicine that has not been prescribed by your physicians.  ?Do not sign important papers or make important decisions while on narcotic pain medicines.  ?Have a responsible person with you.  ? ?PAIN MANAGEMENT ?Motrin '800mg'$ .  (This is the same as 4-'200mg'$  over the counter tablets of Motrin or ibuprofen.)  You may take this every eight hours or as needed for cramping.   ?Vicodin 5/'325mg'$ .  For more severe pain, take one or two tablets every four to six hours as needed for pain control.  (Remember that narcotic pain medications increase your risk of constipation.  If this becomes a problem, you may take an over the counter stool softener like Colace '100mg'$  up to four times a day.) ? ?DO'S AND DON'T'S ?Do not take a tub bath for one week.  You may shower on the first day after your surgery ?Do not do any heavy lifting for one to two weeks.  This increases the chance of bleeding. ?Do move around as you feel able.  Stairs are fine.  You may begin to exercise again as you feel able.  Do not lift any weights for two weeks. ?Do not put anything in the vagina for two weeks--no tampons, intercourse, or douching.   ? ?REGULAR MEDIATIONS/VITAMINS: ?You may restart all of your regular medications as prescribed. ?You may restart all of your vitamins as you normally take them.   ? ?PLEASE CALL OR SEEK MEDICAL CARE IF: ?You have persistent nausea and vomiting.  ?You have trouble eating or drinking.  ?You have an oral temperature above 100.5.  ?You have constipation that is  not helped by adjusting diet or increasing fluid intake. Pain medicines are a common cause of constipation.  ?You have heavy vaginal bleeding ? ? ? ?NO IBUPROFEN PRODUCTS (MOTRIN, ADVIL) OR ALEVE UNTIL 5:15PM TODAY. ? ? ?Post Anesthesia Home Care Instructions ? ?Activity: ?Get plenty of rest for the remainder of the day. A responsible individual must stay with you for 24 hours following the procedure.  ?For the next 24 hours, DO NOT: ?-Drive a car ?-Paediatric nurse ?-Drink alcoholic beverages ?-Take any medication unless instructed by your physician ?-Make any legal decisions or sign important papers. ? ?Meals: ?Start with liquid foods such as gelatin or soup. Progress to regular foods as tolerated. Avoid greasy, spicy, heavy foods. If nausea and/or vomiting occur, drink only clear liquids until the nausea and/or vomiting subsides. Call your physician if vomiting continues. ? ?Special Instructions/Symptoms: ?Your throat may feel dry or sore from the anesthesia or the breathing tube placed in your throat during surgery. If this causes discomfort, gargle with warm salt water. The discomfort should disappear within 24 hours. ? ?If you had a scopolamine patch placed behind your ear for the management of post- operative nausea and/or vomiting: ? ?1. The medication in the patch is effective for 72 hours, after which it should be removed.  Wrap patch in a tissue and discard in the trash. Wash hands thoroughly with soap and water. ?2. You may remove the  patch earlier than 72 hours if you experience unpleasant side effects which may include dry mouth, dizziness or visual disturbances. ?3. Avoid touching the patch. Wash your hands with soap and water after contact with the patch. ?    ? ? ? ? ?

## 2021-06-11 NOTE — Anesthesia Postprocedure Evaluation (Signed)
Anesthesia Post Note ? ?Patient: Virginia [name] ? ?Procedure(s) Performed: DILATATION AND CURETTAGE /HYSTEROSCOPY ? ?  ? ?Patient location during evaluation: PACU ?Anesthesia Type: General ?Level of consciousness: awake and alert ?Pain management: pain level controlled ?Vital Signs Assessment: post-procedure vital signs reviewed and stable ?Respiratory status: spontaneous breathing, nonlabored ventilation, respiratory function stable and patient connected to nasal cannula oxygen ?Cardiovascular status: blood pressure returned to baseline and stable ?Postop Assessment: no apparent nausea or vomiting ?Anesthetic complications: no ? ? ?No notable events documented. ? ?Last Vitals:  ?Vitals:  ? 06/11/21 1230 06/11/21 1250  ?BP: (!) 139/99 (!) 138/101  ?Pulse: 75 87  ?Resp: 20 15  ?Temp: 36.4 ?C (!) 36.4 ?C  ?SpO2: 100% 96%  ?  ?Last Pain:  ?Vitals:  ? 06/11/21 1250  ?TempSrc:   ?PainSc: 3   ? ? ?  ?  ?  ?  ?  ?  ? ?March Rummage Exodus Kutzer ? ? ? ? ?

## 2021-06-11 NOTE — Op Note (Signed)
06/11/2021 ? ?11:35 AM ? ?PATIENT:  Virginia [name]  41 y.o. female ? ?PRE-OPERATIVE DIAGNOSIS:  abnormal vaginal discharge, abnormal appearing endometrium ? ?POST-OPERATIVE DIAGNOSIS:  abnormal vaginal discharge and abnormal appearing endometrium ? ?PROCEDURE:  Procedure(s): ?DILATATION AND CURETTAGE /HYSTEROSCOPY ? ?SURGEON:  Megan Salon ? ?ASSISTANTS: OR staff.   ? ?ANESTHESIA:   general, Dr. Helen Hashimoto ? ?ESTIMATED BLOOD LOSS: 10 mL ? ?BLOOD ADMINISTERED:none  ? ?FLUIDS: 300cc R ? ?UOP: pt voided before going back to the OR ? ?SPECIMEN:  endometrial curettings ? ?DISPOSITION OF SPECIMEN:  PATHOLOGY ? ?FINDINGS: no evidence of polyp, endometrium with calcifications present ? ?DESCRIPTION OF OPERATION: Patient was taken to the operating room.  She is placed in the supine position. SCDs were on her lower extremities and functioning properly. General anesthesia with an LMA was administered without difficulty. Dr. Edman Circle, anesthesia, oversaw case.  Time out performed. ? ?Legs were then placed in the [place] in the low lithotomy position. The legs were lifted to the high lithotomy position and the Betadine prep was used on the inner thighs perineum and vagina x3. Patient was draped in a normal standard fashion.  A bivalve speculum was placed the vagina. The anterior lip of the cervix was grasped with single-tooth tenaculum.  A paracervical block of 1% lidocaine mixed one-to-one with epinephrine (1:100,000 units).  10 cc was used total. The cervix is dilated up to #21 [facility] dilators. The endometrial cavity sounded to 8 cm. ? ? A 2.9 millimeter diagnostic hysteroscope was obtained. Normal saline was used as a hysteroscopic fluid. The hysteroscope was advanced through the endocervical canal into the endometrial cavity. The tubal ostia were noted bilaterally. Additional findings included calcifications within the endometrium.  No evidence of polyp present.  The hysteroscope was removed. A #1 toothed curette  was used to curette the cavity until rough gritty texture was noted in all quadrants. With revisualization of the hysteroscope, adequate endometrial sampling was present.  A second curetting was then performed.  At this point no other procedure was needed and this procedure was ended. The hysteroscope was removed. The fluid deficit was 120 cc LR. The tenaculum was removed from the anterior lip of the cervix. The speculum was removed from the vagina. The prep was cleansed of the patient's skin. The legs are positioned back in the supine position. Sponge, lap, needle, initially counts were correct x2. Patient was taken to recovery in stable condition. ? ?COUNTS:  YES ? ?PLAN OF CARE: Transfer to PACU ? ? ? ? ? ? ? ? ? ? ? ? ?  ? ? ?

## 2021-06-11 NOTE — H&P (Signed)
Virginia [name] is an 41 y.o. G56P2002 Married AA female here for hysteroscopy with polyp resection, D&C due to abnormal endometrium and abnormal vaginal discharge.  Pt was seen on 03/13/2021 for this complaint.  Vaginitis testing was obtained which was negative.  Endometrial biopsy and pap smear obtained.  Biopsy showed polypoid fragments of inactive appearing endometrium.  Because biopsy was negative, ultrasound was obtained.  Endometrium is 2.9 - 3.0cm and has cystic spaces.  This is consistent with an endometrial polyp(s).  Additional evaluation with hysteroscopy recommended including removal of endometrial lesion and D&C.  Risks and benefits have been discussed.  Pt is here and ready to proceed. ? ?Pertinent Gynecological History: ?Menses: post-menopausal ?Bleeding: none ?Contraception:  h/o BSO ?DES exposure: denies ?Blood transfusions: none ?Sexually transmitted diseases: no past history ?Previous GYN Procedure:  laparoscopic BSO ?Last mammogram:  06/2020 ?Last pap: normal Date: 03/13/2021 ?OB History: G2, P2  ? ?Menstrual History: ?No LMP recorded. (Menstrual status: Chemotherapy). ?  ? ?Past Medical History:  ?Diagnosis Date  ? Abnormal endometrial ultrasound   ? Anxiety   ? Complication of anesthesia   ? per anesthesia record , by Dr Jenita Seashore, for surgery done '@[facility]'$  [id number] pt had complication , laryngospasm w/ negative pressure pulmonary edema  ? Eczema   ? Family history of breast cancer   ? Family history of prostate cancer   ? History of chemotherapy   ? left breast cancer--- 04-10-2016  to 04-10-2017  ? History of COVID-19 02/2019  ? per pt mild symptoms that resovled  ? History of hypertension   ? per pt this was only due to chemotherapy,  resolved after chemo completec  ? History of hypothyroidism   ? with pregnancy  ? History of neuropathy   ? with chemo  ? History of radiation therapy   ? 06-11-2016  to 07-23-2016---- Left Breast 50 Gy in 25 fractions, Left Breast boost 10 Gy in 5 fractions.   ? Lymphedema of arm   ? LEFT  ? 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 oncologist--- dr Jana Hakim (per lov note in epic 08-23-2020 pt released);  dx 11/ 2022, Stage IA, IDC, ER/PR/ HER positive;  completed chemo 04-10-2017 and radiation 07-23-2016  ? Migraine   ? PONV (postoperative nausea and vomiting)   ? Wears glasses   ? ? ?Past Surgical History:  ?Procedure Laterality Date  ? BREAST LUMPECTOMY WITH RADIOACTIVE SEED AND SENTINEL LYMPH NODE BIOPSY Left 03/13/2016  ? Procedure: LEFT BREAST LUMPECTOMY WITH RADIOACTIVE SEED X 2 AND SENTINEL LYMPH NODE BIOPSY;  Surgeon: Stark Klein, MD;  Location: [place];  Service: General;  Laterality: Left;  ? PORT-A-CATH REMOVAL N/A 04/16/2017  ? Procedure: REMOVAL PORT-A-CATH;  Surgeon: Stark Klein, MD;  Location: [place];  Service: General;  Laterality: N/A;  ? PORTACATH PLACEMENT Right 03/13/2016  ? Procedure: INSERTION PORT-A-CATH;  Surgeon: Stark Klein, MD;  Location: [place];  Service: General;  Laterality: Right;  ? ROBOTIC ASSISTED SALPINGO OOPHERECTOMY Bilateral 02/02/2020  ? Procedure: XI ROBOTIC ASSISTED SALPINGO OOPHORECTOMY;  Surgeon: Lafonda Mosses, MD;  Location: [facility];  Service: Gynecology;  Laterality: Bilateral;  ? ? ?Family History  ?Problem Relation Age of Onset  ? Hypertension Mother   ? Hypertension Father   ? Prostate cancer Father   ? Liver cancer Maternal Grandmother   ? Breast cancer Paternal Grandmother   ?     dx in her 68s  ? Lung cancer Paternal Aunt   ?  smoker  ? Heart attack Paternal Grandfather   ? Liver cancer Other   ? ? ?Social History:  reports that she has never smoked. She has never used smokeless tobacco. She reports that she does not currently use alcohol. She reports that she does not use drugs. ? ?Allergies:  ?Allergies  ?Allergen Reactions  ? Amoxicillin Diarrhea and Nausea Only  ?  Has patient had a PCN reaction causing immediate rash, facial/tongue/throat swelling, SOB or  lightheadedness with hypotension: No ?Has patient had a PCN reaction causing severe rash involving mucus membranes or skin necrosis: No ?Has patient had a PCN reaction that required hospitalization No ?Has patient had a PCN reaction occurring within the last 10 years: Yes ?If all of the above answers are "NO", then may proceed with Cephalosporin use. ?  ? ? ?Medications Prior to Admission  ?Medication Sig Dispense Refill Last Dose  ? acetaminophen (TYLENOL) 325 MG tablet Take 650 mg by mouth every 6 (six) hours as needed for moderate pain or headache.    Past Week  ? ibuprofen (ADVIL) 200 MG tablet Take 800 mg by mouth every 6 (six) hours as needed.   06/10/2021  ? Multiple Vitamins-Minerals (MULTIVITAMIN GUMMIES ADULT PO) Take 2 tablets by mouth daily.   06/10/2021  ? venlafaxine XR (EFFEXOR-XR) 150 MG 24 hr capsule TAKE 1 CAPSULE BY MOUTH EVERY MORNING WITH BREAKFAST (Patient taking differently: 150 mg daily with breakfast. TAKE 1 CAPSULE BY MOUTH EVERY MORNING WITH BREAKFAST) 90 capsule 4 06/11/2021  ? ? ?Review of Systems  ?All other systems reviewed and are negative. ? ?Blood pressure (!) 146/98, pulse 69, temperature 98 ?F (36.7 ?C), temperature source Oral, resp. rate 18, height '5\' 7"'$  (1.702 m), weight 91.4 kg, SpO2 97 %. ?Physical Exam ?Constitutional:   ?   Appearance: Normal appearance.  ?Cardiovascular:  ?   Rate and Rhythm: Normal rate and regular rhythm.  ?Pulmonary:  ?   Effort: Pulmonary effort is normal.  ?   Breath sounds: Normal breath sounds.  ?Neurological:  ?   General: No focal deficit present.  ?   Mental Status: She is alert.  ?Psychiatric:     ?   Mood and Affect: Mood normal.     ?   Behavior: Behavior normal.  ? ? ?Results for orders placed or performed during the hospital encounter of 06/11/21 (from the past 24 hour(s))  ?Pregnancy, urine POC     Status: None  ? Collection Time: 06/11/21  8:33 AM  ?Result Value Ref Range  ? Preg Test, Ur NEGATIVE NEGATIVE  ? ? ?No results  found. ? ?Assessment/Plan: ?41 yo G2P2 MAAF with abnormal vaginal discharge, abnormal appearing endometrium c/w polyp, h/o breast cancer, h/o BSO here for hysteroscopy with probable polyp resection, D&C.  Questions answered.  Pt ready to proceed. ? ?Megan Salon ?06/11/2021, 10:46 AM ? ?

## 2021-06-11 NOTE — Anesthesia Procedure Notes (Signed)
Procedure Name: LMA Insertion ?Date/Time: 06/11/2021 10:58 AM ?Performed by: Mechele Claude, CRNA ?Pre-anesthesia Checklist: Patient identified, Emergency Drugs available, Suction available and Patient being monitored ?Patient Re-evaluated:Patient Re-evaluated prior to induction ?Oxygen Delivery Method: Circle system utilized ?Preoxygenation: Pre-oxygenation with 100% oxygen ?Induction Type: IV induction ?Ventilation: Mask ventilation without difficulty ?LMA: LMA inserted ?LMA Size: 4.0 ?Number of attempts: 1 ?Airway Equipment and Method: Bite block ?Placement Confirmation: positive ETCO2 ?Tube secured with: Tape ?Dental Injury: Teeth and Oropharynx as per pre-operative assessment  ? ? ? ? ?

## 2021-06-12 ENCOUNTER — Encounter (HOSPITAL_BASED_OUTPATIENT_CLINIC_OR_DEPARTMENT_OTHER): Payer: Self-pay | Admitting: Obstetrics & Gynecology

## 2021-06-12 LAB — SURGICAL PATHOLOGY

## 2021-07-16 ENCOUNTER — Ambulatory Visit (INDEPENDENT_AMBULATORY_CARE_PROVIDER_SITE_OTHER): Payer: BC Managed Care – PPO | Admitting: Obstetrics & Gynecology

## 2021-07-16 ENCOUNTER — Encounter (HOSPITAL_BASED_OUTPATIENT_CLINIC_OR_DEPARTMENT_OTHER): Payer: Self-pay | Admitting: Obstetrics & Gynecology

## 2021-07-16 VITALS — BP 143/100 | HR 69 | Ht 66.0 in | Wt 204.0 lb

## 2021-07-16 DIAGNOSIS — R935 Abnormal findings on diagnostic imaging of other abdominal regions, including retroperitoneum: Secondary | ICD-10-CM

## 2021-07-16 DIAGNOSIS — N8501 Benign endometrial hyperplasia: Secondary | ICD-10-CM | POA: Diagnosis not present

## 2021-07-22 NOTE — Progress Notes (Signed)
GYNECOLOGY  VISIT  CC:   post op recheck  HPI: 41 y.o. G2P2002 Married Other or two or more races female here for recheck after undergoing hysteroscopy with D&C on 06/11/2021.  She reports bleeding is minimal.  She has no pain.  Bowel function is Normal.  Bladder function is normal.    Pathology reviewed:  Yes .  Pathology showed glandular crowing approaching benign/simple hyperplasia.  We discussed this finding and typical treatment/follow up after this diagnosis.  Pt has hx of ER+/PR+ breast cancer.  Should not be on progestin therapy.  Therefore, there is no other treatment except follow up and hysterectomy.  She is starting a new job and just not ready to proceed with another surgery.  Did have BSO done previously on 01/2020.  Discussed repeating ultrasound and endometrial biopsy in 6 months to follow.  If any hyperplasia is present or endometrium thickened, then really should consider surgery.  This way, pt has time to see how new job is going and to consider treatment plan as well.  Questions answered.    MEDS:   Current Outpatient Medications on File Prior to Visit  Medication Sig Dispense Refill   acetaminophen (TYLENOL) 325 MG tablet Take 650 mg by mouth every 6 (six) hours as needed for moderate pain or headache.      ibuprofen (ADVIL) 800 MG tablet Take 1 tablet (800 mg total) by mouth every 8 (eight) hours as needed. 30 tablet 0   Multiple Vitamins-Minerals (MULTIVITAMIN GUMMIES ADULT PO) Take 2 tablets by mouth daily.     venlafaxine XR (EFFEXOR-XR) 150 MG 24 hr capsule TAKE 1 CAPSULE BY MOUTH EVERY MORNING WITH BREAKFAST (Patient taking differently: 150 mg daily with breakfast. TAKE 1 CAPSULE BY MOUTH EVERY MORNING WITH BREAKFAST) 90 capsule 4   [DISCONTINUED] prochlorperazine (COMPAZINE) 10 MG tablet Take 1 tablet (10 mg total) by mouth every 6 (six) hours as needed (Nausea or vomiting). (Patient not taking: Reported on 06/03/2016) 30 tablet 1   No current facility-administered  medications on file prior to visit.    SH:  Smoking No    PHYSICAL EXAMINATION:    BP (!) 143/100 (BP Location: Right Arm, Patient Position: Sitting, Cuff Size: Large)   Pulse 69   Ht '5\' 6"'$  (1.676 m) Comment: reported  Wt 204 lb (92.5 kg)   BMI 32.93 kg/m     General appearance: alert, cooperative and appears stated age No other exam performed today  Assessment/Plan: 1. Abnormal ultrasound of endometrium - US PELVIS TRANSVAGINAL NON-OB (TV ONLY); Future  2. Simple endometrial hyperplasia without atypia - pathology was not fully this finding but almost - will repeat ultrasound and biopsy in 6 months - pt knows to call with any bleeding that occurs prior to that time   Total time with pt 21 minutes, time with documentation 4 minutes

## 2021-09-10 ENCOUNTER — Encounter (HOSPITAL_BASED_OUTPATIENT_CLINIC_OR_DEPARTMENT_OTHER): Payer: Self-pay | Admitting: Obstetrics & Gynecology

## 2021-10-22 ENCOUNTER — Emergency Department (HOSPITAL_COMMUNITY): Payer: 59

## 2021-10-22 ENCOUNTER — Other Ambulatory Visit: Payer: Self-pay

## 2021-10-22 ENCOUNTER — Emergency Department (HOSPITAL_COMMUNITY): Payer: 59 | Admitting: Certified Registered"

## 2021-10-22 ENCOUNTER — Ambulatory Visit (HOSPITAL_COMMUNITY)
Admission: EM | Admit: 2021-10-22 | Discharge: 2021-10-22 | Disposition: A | Discharge status: Home / Self Care | Payer: 59 | Attending: Student | Admitting: Student

## 2021-10-22 ENCOUNTER — Encounter (HOSPITAL_COMMUNITY)
Admission: EM | Disposition: A | Discharge status: Home / Self Care | Payer: Self-pay | Source: Home / Self Care | Attending: Emergency Medicine

## 2021-10-22 ENCOUNTER — Emergency Department (HOSPITAL_BASED_OUTPATIENT_CLINIC_OR_DEPARTMENT_OTHER): Payer: 59 | Admitting: Certified Registered"

## 2021-10-22 ENCOUNTER — Encounter (HOSPITAL_COMMUNITY): Payer: Self-pay

## 2021-10-22 DIAGNOSIS — R7989 Other specified abnormal findings of blood chemistry: Secondary | ICD-10-CM | POA: Diagnosis present

## 2021-10-22 DIAGNOSIS — K8012 Calculus of gallbladder with acute and chronic cholecystitis without obstruction: Secondary | ICD-10-CM | POA: Diagnosis not present

## 2021-10-22 DIAGNOSIS — E669 Obesity, unspecified: Secondary | ICD-10-CM | POA: Diagnosis not present

## 2021-10-22 DIAGNOSIS — Z853 Personal history of malignant neoplasm of breast: Secondary | ICD-10-CM | POA: Insufficient documentation

## 2021-10-22 DIAGNOSIS — Z6831 Body mass index (BMI) 31.0-31.9, adult: Secondary | ICD-10-CM

## 2021-10-22 DIAGNOSIS — I10 Essential (primary) hypertension: Secondary | ICD-10-CM

## 2021-10-22 DIAGNOSIS — K819 Cholecystitis, unspecified: Secondary | ICD-10-CM | POA: Diagnosis present

## 2021-10-22 DIAGNOSIS — K8000 Calculus of gallbladder with acute cholecystitis without obstruction: Secondary | ICD-10-CM | POA: Diagnosis not present

## 2021-10-22 DIAGNOSIS — F419 Anxiety disorder, unspecified: Secondary | ICD-10-CM | POA: Diagnosis not present

## 2021-10-22 HISTORY — PX: CHOLECYSTECTOMY: SHX55

## 2021-10-22 LAB — URINALYSIS, ROUTINE W REFLEX MICROSCOPIC
Bilirubin Urine: NEGATIVE
Glucose, UA: NEGATIVE mg/dL
Hgb urine dipstick: NEGATIVE
Ketones, ur: NEGATIVE mg/dL
Leukocytes,Ua: NEGATIVE
Nitrite: NEGATIVE
Protein, ur: NEGATIVE mg/dL
Specific Gravity, Urine: 1.04 — ABNORMAL HIGH (ref 1.005–1.030)
pH: 7 (ref 5.0–8.0)

## 2021-10-22 LAB — CBC
HCT: 36.5 % (ref 36.0–46.0)
Hemoglobin: 11.3 g/dL — ABNORMAL LOW (ref 12.0–15.0)
MCH: 27.2 pg (ref 26.0–34.0)
MCHC: 31 g/dL (ref 30.0–36.0)
MCV: 87.7 fL (ref 80.0–100.0)
Platelets: 144 10*3/uL — ABNORMAL LOW (ref 150–400)
RBC: 4.16 MIL/uL (ref 3.87–5.11)
RDW: 13.1 % (ref 11.5–15.5)
WBC: 6.8 10*3/uL (ref 4.0–10.5)
nRBC: 0 % (ref 0.0–0.2)

## 2021-10-22 LAB — COMPREHENSIVE METABOLIC PANEL
ALT: 557 U/L — ABNORMAL HIGH (ref 0–44)
AST: 388 U/L — ABNORMAL HIGH (ref 15–41)
Albumin: 3.7 g/dL (ref 3.5–5.0)
Alkaline Phosphatase: 144 U/L — ABNORMAL HIGH (ref 38–126)
Anion gap: 9 (ref 5–15)
BUN: 10 mg/dL (ref 6–20)
CO2: 25 mmol/L (ref 22–32)
Calcium: 8.7 mg/dL — ABNORMAL LOW (ref 8.9–10.3)
Chloride: 108 mmol/L (ref 98–111)
Creatinine, Ser: 0.71 mg/dL (ref 0.44–1.00)
GFR, Estimated: >60 mL/min (ref >60)
Glucose, Bld: 118 mg/dL — ABNORMAL HIGH (ref 70–99)
Potassium: 3 mmol/L — ABNORMAL LOW (ref 3.5–5.1)
Sodium: 142 mmol/L (ref 135–145)
Total Bilirubin: 1.3 mg/dL — ABNORMAL HIGH (ref 0.3–1.2)
Total Protein: 6.8 g/dL (ref 6.5–8.1)

## 2021-10-22 LAB — I-STAT BETA HCG BLOOD, ED (MC, WL, AP ONLY): I-stat hCG, quantitative: <5 m[IU]/mL (ref <5)

## 2021-10-22 LAB — LIPASE, BLOOD: Lipase: 28 U/L (ref 11–51)

## 2021-10-22 SURGERY — LAPAROSCOPIC CHOLECYSTECTOMY WITH INTRAOPERATIVE CHOLANGIOGRAM
Anesthesia: General | Site: Abdomen

## 2021-10-22 MED ORDER — SCOPOLAMINE 1 MG/3DAYS TD PT72
MEDICATED_PATCH | TRANSDERMAL | Status: DC
Start: 2021-10-22 — End: 2021-10-22
  Filled 2021-10-22: qty 1

## 2021-10-22 MED ORDER — HYDROMORPHONE HCL 1 MG/ML IJ SOLN
0.2500 mg | INTRAMUSCULAR | Status: DC | PRN
  Administered 2021-10-22: 0.5 mg via INTRAVENOUS

## 2021-10-22 MED ORDER — IOHEXOL 300 MG/ML  SOLN
INTRAMUSCULAR | Status: DC | PRN
  Administered 2021-10-22: 3.5 mL

## 2021-10-22 MED ORDER — DEXMEDETOMIDINE (PRECEDEX) IN NS 20 MCG/5ML (4 MCG/ML) IV SYRINGE
PREFILLED_SYRINGE | INTRAVENOUS | Status: DC | PRN
  Administered 2021-10-22: 4 ug via INTRAVENOUS
  Administered 2021-10-22: 12 ug via INTRAVENOUS

## 2021-10-22 MED ORDER — PHENYLEPHRINE 80 MCG/ML (10ML) SYRINGE FOR IV PUSH (FOR BLOOD PRESSURE SUPPORT)
PREFILLED_SYRINGE | INTRAVENOUS | Status: AC
Start: 2021-10-22 — End: ?
  Filled 2021-10-22: qty 10

## 2021-10-22 MED ORDER — HYDROMORPHONE HCL 2 MG/ML IJ SOLN
1.0000 mg | Freq: Once | INTRAMUSCULAR | Status: AC
  Administered 2021-10-22: 1 mg via INTRAVENOUS
  Filled 2021-10-22: qty 1

## 2021-10-22 MED ORDER — PHENYLEPHRINE 80 MCG/ML (10ML) SYRINGE FOR IV PUSH (FOR BLOOD PRESSURE SUPPORT)
PREFILLED_SYRINGE | INTRAVENOUS | Status: DC | PRN
  Administered 2021-10-22 (×3): 160 ug via INTRAVENOUS

## 2021-10-22 MED ORDER — LACTATED RINGERS IV SOLN: INTRAVENOUS | Status: DC

## 2021-10-22 MED ORDER — AMISULPRIDE (ANTIEMETIC) 5 MG/2ML IV SOLN: 10.0000 mg | Freq: Once | INTRAVENOUS | Status: DC | PRN

## 2021-10-22 MED ORDER — ROCURONIUM BROMIDE 10 MG/ML (PF) SYRINGE
PREFILLED_SYRINGE | INTRAVENOUS | Status: AC
  Filled 2021-10-22: qty 10

## 2021-10-22 MED ORDER — ONDANSETRON HCL 4 MG/2ML IJ SOLN: 4.0000 mg | Freq: Once | INTRAMUSCULAR | Status: DC | PRN

## 2021-10-22 MED ORDER — KETOROLAC TROMETHAMINE 30 MG/ML IJ SOLN: 30.0000 mg | Freq: Once | INTRAMUSCULAR | Status: DC | PRN

## 2021-10-22 MED ORDER — 0.9 % SODIUM CHLORIDE (POUR BTL) OPTIME
TOPICAL | Status: DC | PRN
  Administered 2021-10-22: 1000 mL

## 2021-10-22 MED ORDER — PROPOFOL 10 MG/ML IV BOLUS
INTRAVENOUS | Status: DC | PRN
  Administered 2021-10-22: 140 mg via INTRAVENOUS

## 2021-10-22 MED ORDER — LACTATED RINGERS IR SOLN
Status: DC | PRN
  Administered 2021-10-22: 1000 mL

## 2021-10-22 MED ORDER — OXYCODONE HCL 5 MG PO TABS: 5.0000 mg | ORAL_TABLET | Freq: Four times a day (QID) | ORAL | 0 refills | Status: DC | PRN

## 2021-10-22 MED ORDER — FENTANYL CITRATE PF 50 MCG/ML IJ SOSY
50.0000 ug | PREFILLED_SYRINGE | Freq: Once | INTRAMUSCULAR | Status: AC
  Administered 2021-10-22: 50 ug via INTRAVENOUS
  Filled 2021-10-22: qty 1

## 2021-10-22 MED ORDER — BUPIVACAINE-EPINEPHRINE 0.5% -1:200000 IJ SOLN
INTRAMUSCULAR | Status: DC | PRN
  Administered 2021-10-22: 9 mL

## 2021-10-22 MED ORDER — ONDANSETRON HCL 4 MG/2ML IJ SOLN
INTRAMUSCULAR | Status: DC | PRN
  Administered 2021-10-22: 4 mg via INTRAVENOUS

## 2021-10-22 MED ORDER — KETOROLAC TROMETHAMINE 30 MG/ML IJ SOLN
INTRAMUSCULAR | Status: DC | PRN
  Administered 2021-10-22: 30 mg via INTRAVENOUS

## 2021-10-22 MED ORDER — SODIUM CHLORIDE 0.9 % IV SOLN
2.0000 g | INTRAVENOUS | Status: DC
  Administered 2021-10-22: 2 g via INTRAVENOUS
  Filled 2021-10-22: qty 20

## 2021-10-22 MED ORDER — LIDOCAINE HCL (CARDIAC) PF 100 MG/5ML IV SOSY
PREFILLED_SYRINGE | INTRAVENOUS | Status: DC | PRN
  Administered 2021-10-22: 100 mg via INTRATRACHEAL

## 2021-10-22 MED ORDER — MIDAZOLAM HCL 2 MG/2ML IJ SOLN
INTRAMUSCULAR | Status: AC
  Filled 2021-10-22: qty 2

## 2021-10-22 MED ORDER — DEXAMETHASONE SODIUM PHOSPHATE 10 MG/ML IJ SOLN
INTRAMUSCULAR | Status: DC | PRN
  Administered 2021-10-22: 8 mg via INTRAVENOUS

## 2021-10-22 MED ORDER — OXYCODONE HCL 5 MG PO TABS
5.0000 mg | ORAL_TABLET | Freq: Once | ORAL | Status: AC | PRN
  Administered 2021-10-22: 5 mg via ORAL

## 2021-10-22 MED ORDER — APREPITANT 40 MG PO CAPS
40.0000 mg | ORAL_CAPSULE | Freq: Once | ORAL | Status: AC
  Administered 2021-10-22: 40 mg via ORAL
  Filled 2021-10-22: qty 1

## 2021-10-22 MED ORDER — ONDANSETRON HCL 4 MG/2ML IJ SOLN
INTRAMUSCULAR | Status: AC
  Filled 2021-10-22: qty 2

## 2021-10-22 MED ORDER — BUPIVACAINE-EPINEPHRINE (PF) 0.5% -1:200000 IJ SOLN
INTRAMUSCULAR | Status: AC
  Filled 2021-10-22: qty 30

## 2021-10-22 MED ORDER — LIDOCAINE HCL (PF) 2 % IJ SOLN
INTRAMUSCULAR | Status: AC
Start: 2021-10-22 — End: ?
  Filled 2021-10-22: qty 5

## 2021-10-22 MED ORDER — IOHEXOL 300 MG/ML  SOLN
100.0000 mL | Freq: Once | INTRAMUSCULAR | Status: AC | PRN
  Administered 2021-10-22: 100 mL via INTRAVENOUS

## 2021-10-22 MED ORDER — SUGAMMADEX SODIUM 200 MG/2ML IV SOLN
INTRAVENOUS | Status: DC | PRN
  Administered 2021-10-22: 180 mg via INTRAVENOUS

## 2021-10-22 MED ORDER — FENTANYL CITRATE (PF) 100 MCG/2ML IJ SOLN
INTRAMUSCULAR | Status: AC
  Filled 2021-10-22: qty 2

## 2021-10-22 MED ORDER — DEXAMETHASONE SODIUM PHOSPHATE 10 MG/ML IJ SOLN
INTRAMUSCULAR | Status: AC
  Filled 2021-10-22: qty 1

## 2021-10-22 MED ORDER — SCOPOLAMINE 1 MG/3DAYS TD PT72
1.0000 | MEDICATED_PATCH | TRANSDERMAL | Status: DC
  Administered 2021-10-22: 1.5 mg via TRANSDERMAL

## 2021-10-22 MED ORDER — OXYCODONE HCL 5 MG/5ML PO SOLN: 5.0000 mg | Freq: Once | ORAL | Status: AC | PRN

## 2021-10-22 MED ORDER — FENTANYL CITRATE (PF) 100 MCG/2ML IJ SOLN
INTRAMUSCULAR | Status: DC | PRN
  Administered 2021-10-22: 100 ug via INTRAVENOUS

## 2021-10-22 MED ORDER — SODIUM CHLORIDE (PF) 0.9 % IJ SOLN
INTRAMUSCULAR | Status: AC
  Filled 2021-10-22: qty 50

## 2021-10-22 MED ORDER — OXYCODONE HCL 5 MG PO TABS
ORAL_TABLET | ORAL | Status: AC
  Filled 2021-10-22: qty 1

## 2021-10-22 MED ORDER — HYDROMORPHONE HCL 1 MG/ML IJ SOLN
INTRAMUSCULAR | Status: AC
  Filled 2021-10-22: qty 1

## 2021-10-22 SURGICAL SUPPLY — 40 items
APPLIER CLIP ROT 10 11.4 M/L (STAPLE) ×1
BENZOIN TINCTURE AMPULE (MISCELLANEOUS) IMPLANT
BENZOIN TINCTURE PRP APPL 2/3 (GAUZE/BANDAGES/DRESSINGS) ×1 IMPLANT
CABLE HIGH FREQUENCY MONO STRZ (ELECTRODE) ×1 IMPLANT
CHLORAPREP W/TINT 26 (MISCELLANEOUS) ×1 IMPLANT
CLIP APPLIE ROT 10 11.4 M/L (STAPLE) ×1 IMPLANT
COVER MAYO STAND XLG (MISCELLANEOUS) ×1 IMPLANT
COVER SURGICAL LIGHT HANDLE (MISCELLANEOUS) ×1 IMPLANT
DRAPE C-ARM 42X120 X-RAY (DRAPES) ×1 IMPLANT
DRSG TEGADERM 2-3/8X2-3/4 SM (GAUZE/BANDAGES/DRESSINGS) ×3 IMPLANT
DRSG TEGADERM 4X4.75 (GAUZE/BANDAGES/DRESSINGS) ×1 IMPLANT
ELECT REM PT RETURN 15FT ADLT (MISCELLANEOUS) ×1 IMPLANT
GAUZE SPONGE 2X2 8PLY STRL LF (GAUZE/BANDAGES/DRESSINGS) IMPLANT
GLOVE BIO SURGEON STRL SZ7 (GLOVE) ×1 IMPLANT
GLOVE BIOGEL PI IND STRL 7.5 (GLOVE) ×1 IMPLANT
GLOVE BIOGEL PI INDICATOR 7.5 (GLOVE) ×1
GOWN STRL REUS W/ TWL LRG LVL3 (GOWN DISPOSABLE) ×1 IMPLANT
GOWN STRL REUS W/TWL LRG LVL3 (GOWN DISPOSABLE) ×1
IRRIG SUCT STRYKERFLOW 2 WTIP (MISCELLANEOUS) ×1
IRRIGATION SUCT STRKRFLW 2 WTP (MISCELLANEOUS) ×1 IMPLANT
KIT BASIN OR (CUSTOM PROCEDURE TRAY) ×1 IMPLANT
KIT TURNOVER KIT A (KITS) IMPLANT
NS IRRIG 1000ML POUR BTL (IV SOLUTION) ×1 IMPLANT
POUCH RETRIEVAL ECOSAC 10 (ENDOMECHANICALS) IMPLANT
POUCH RETRIEVAL ECOSAC 10MM (ENDOMECHANICALS) ×1
SCISSORS LAP 5X35 DISP (ENDOMECHANICALS) ×1 IMPLANT
SET CHOLANGIOGRAPH MIX (MISCELLANEOUS) ×1 IMPLANT
SET TUBE SMOKE EVAC HIGH FLOW (TUBING) ×1 IMPLANT
SLEEVE Z-THREAD 5X100MM (TROCAR) ×1 IMPLANT
SPIKE FLUID TRANSFER (MISCELLANEOUS) ×1 IMPLANT
STRIP CLOSURE SKIN 1/2X4 (GAUZE/BANDAGES/DRESSINGS) ×1 IMPLANT
SUT MNCRL AB 4-0 PS2 18 (SUTURE) ×1 IMPLANT
SYS BAG RETRIEVAL 10MM (BASKET)
SYSTEM BAG RETRIEVAL 10MM (BASKET) IMPLANT
TOWEL OR 17X26 10 PK STRL BLUE (TOWEL DISPOSABLE) ×1 IMPLANT
TOWEL OR NON WOVEN STRL DISP B (DISPOSABLE) ×1 IMPLANT
TRAY LAPAROSCOPIC (CUSTOM PROCEDURE TRAY) ×1 IMPLANT
TROCAR 11X100 Z THREAD (TROCAR) ×1 IMPLANT
TROCAR BALLN 12MMX100 BLUNT (TROCAR) ×1 IMPLANT
TROCAR Z-THREAD OPTICAL 5X100M (TROCAR) ×1 IMPLANT

## 2021-10-22 NOTE — Op Note (Signed)
Laparoscopic Cholecystectomy with IOC Procedure Note  Indications: This patient presents with symptomatic gallbladder disease and will undergo laparoscopic cholecystectomy with cholangiogram.  Pre-operative Diagnosis: Calculus of gallbladder with acute cholecystitis, without mention of obstruction  Post-operative Diagnosis: Same  Surgeon: Maia Petties   Assistants: none  Anesthesia: General endotracheal anesthesia  ASA Class: 2  Procedure Details  The patient was seen again in the Holding Room. The risks, benefits, complications, treatment options, and expected outcomes were discussed with the patient. The possibilities of reaction to medication, pulmonary aspiration, perforation of viscus, bleeding, recurrent infection, finding a normal gallbladder, the need for additional procedures, failure to diagnose a condition, the possible need to convert to an open procedure, and creating a complication requiring transfusion or operation were discussed with the patient. The likelihood of improving the patient's symptoms with return to their baseline status is good.  The patient and/or family concurred with the proposed plan, giving informed consent. The site of surgery properly noted. The patient was taken to Operating Room, identified as Virginia [name] and the procedure verified as Laparoscopic Cholecystectomy with Intraoperative Cholangiogram. A Time Out was held and the above information confirmed.  Prior to the induction of general anesthesia, antibiotic prophylaxis was administered. General endotracheal anesthesia was then administered and tolerated well. After the induction, the abdomen was prepped with Chloraprep and draped in the sterile fashion. The patient was positioned in the supine position.  Local anesthetic agent was injected into the skin above the umbilicus and an incision made. We dissected down to the abdominal fascia with blunt dissection.  She has a tiny umbilical hernia  defect.  The fascia was incised vertically and we entered the peritoneal cavity bluntly.  A pursestring suture of 0-Vicryl was placed around the fascial opening.  The Hasson cannula was inserted and secured with the stay suture.  Pneumoperitoneum was then created with CO2 and tolerated well without any adverse changes in the patient's vital signs. An 11-mm port was placed in the subxiphoid position.  Two 5-mm ports were placed in the right upper quadrant. All skin incisions were infiltrated with a local anesthetic agent before making the incision and placing the trocars.   We positioned the patient in reverse Trendelenburg, tilted slightly to the patient's left.  The gallbladder was identified, the fundus grasped and retracted cephalad.  The gallbladder is quite distended, but not thickened.  There were omental adhesions to the gallbladder wall.  Adhesions were lysed bluntly and with the electrocautery where indicated, taking care not to injure any adjacent organs or viscus. The infundibulum was grasped and retracted laterally, exposing the peritoneum overlying the triangle of Calot. This was then divided and exposed in a blunt fashion. A critical view of the cystic duct and cystic artery was obtained.  The cystic duct was clearly identified and bluntly dissected circumferentially. The cystic duct was ligated with a clip distally.   An incision was made in the cystic duct.  There seemed to be some back-pressure initially.  We milked some tiny stones out of the cystic duct.  The [facility] cholangiogram catheter was introduced. The catheter was secured using a clip. We flushed the catheter with saline.  Initially there was some back-pressure and leaking around the clip.  Then, acutely the cystic duct seemed to decompress and there was no further leaking.  A cholangiogram was then obtained which showed good visualization of the distal and proximal biliary tree with no sign of filling defects or obstruction.  Contrast  flowed easily  into the duodenum. Apparently, a small common duct stone was flushed into the duodenum.  The catheter was then removed.   The cystic duct was then ligated with clips and divided. The cystic artery was identified, dissected free, ligated with clips and divided as well.   The gallbladder was dissected from the liver bed in retrograde fashion with the electrocautery. The gallbladder was removed and placed in an Eco sac. The liver bed was irrigated and inspected. Hemostasis was achieved with the electrocautery. Copious irrigation was utilized and was repeatedly aspirated until clear.  The gallbladder and Eco sac were then removed through the umbilical port site.  The pursestring suture was used to close the umbilical fascia.    We again inspected the right upper quadrant for hemostasis.  Pneumoperitoneum was released as we removed the trocars.  4-0 Monocryl was used to close the skin.   Benzoin, steri-strips, and clean dressings were applied. The patient was then extubated and brought to the recovery room in stable condition. Instrument, sponge, and needle counts were correct at closure and at the conclusion of the case.   Findings: Cholecystitis with Cholelithiasis  Estimated Blood Loss: Minimal         Drains: none         Specimens: Gallbladder           Complications: None; patient tolerated the procedure well.         Disposition: PACU - hemodynamically stable.         Condition: stable  Virginia [name]. Virginia Dover, MD, [facility]  General Surgery   10/22/2021 11:04 AM

## 2021-10-22 NOTE — Anesthesia Procedure Notes (Signed)
Procedure Name: Intubation Date/Time: 10/22/2021 9:47 AM  Performed by: Pilar Grammes, CRNAPre-anesthesia Checklist: Patient identified, Emergency Drugs available, Suction available, Patient being monitored and Timeout performed Patient Re-evaluated:Patient Re-evaluated prior to induction Oxygen Delivery Method: Circle system utilized Preoxygenation: Pre-oxygenation with 100% oxygen Induction Type: IV induction Ventilation: Mask ventilation without difficulty Laryngoscope Size: Miller and 2 Grade View: Grade I Tube type: Oral Tube size: 7.5 mm Number of attempts: 1 Airway Equipment and Method: Stylet Placement Confirmation: positive ETCO2, ETT inserted through vocal cords under direct vision, CO2 detector and breath sounds checked- equal and bilateral Secured at: 22 cm Tube secured with: Tape Dental Injury: Teeth and Oropharynx as per pre-operative assessment

## 2021-10-22 NOTE — ED Provider Notes (Signed)
[facility] DEPT Provider Note   CSN: [id number] Arrival date & time: 10/22/21  0247     History  Chief Complaint  Patient presents with   Abdominal Pain    NIKO JAKEL is a 41 y.o. female who presents with concern for sudden onset right upper quadrant pain that radiates to the back that woke her from her sleep around midnight with associated nausea.  Pain rated 10 out of 10 prompting EMS call.  Patient with history of breast cancer and family history of breast cancer, status post bilateral oophorectomy and 5 years status posttreatment for breast cancer.  She denies any vomiting fevers chills diarrhea, chest pain, or shortness of breath.  She is not currently on any medications daily.  No history of the same symptoms in the past.  In addition to the above listed history patient has history of eczema.  HPI     Home Medications Prior to Admission medications   Medication Sig Start Date End Date Taking? Authorizing Provider  acetaminophen (TYLENOL) 325 MG tablet Take 650 mg by mouth every 6 (six) hours as needed for moderate pain or headache.     [provider]  ibuprofen (ADVIL) 800 MG tablet Take 1 tablet (800 mg total) by mouth every 8 (eight) hours as needed. 06/11/21   Megan Salon, MD  Multiple Vitamins-Minerals (MULTIVITAMIN GUMMIES ADULT PO) Take 2 tablets by mouth daily.    [provider]  venlafaxine XR (EFFEXOR-XR) 150 MG 24 hr capsule TAKE 1 CAPSULE BY MOUTH EVERY MORNING WITH BREAKFAST Patient taking differently: 150 mg daily with breakfast. TAKE 1 CAPSULE BY MOUTH EVERY MORNING WITH BREAKFAST 08/23/20   Magrinat, Virgie Dad, MD  prochlorperazine (COMPAZINE) 10 MG tablet Take 1 tablet (10 mg total) by mouth every 6 (six) hours as needed (Nausea or vomiting). Patient not taking: Reported on 06/03/2016 03/24/16 06/13/16  Magrinat, Virgie Dad, MD      Allergies    Amoxicillin    Review of Systems   Review of Systems   Constitutional:  Positive for appetite change.  HENT: Negative.    Respiratory: Negative.    Cardiovascular: Negative.   Gastrointestinal:  Positive for abdominal pain and nausea.  Genitourinary: Negative.     Physical Exam Updated Vital Signs BP 129/82 (BP Location: Right Arm)   Pulse 80   Temp 97.7 F (36.5 C) (Oral)   Resp 18   Ht '5\' 6"'  (1.676 m)   Wt 89.4 kg   SpO2 98%   BMI 31.80 kg/m  Physical Exam Vitals and nursing note reviewed.  Constitutional:      Appearance: She is obese. She is not ill-appearing or toxic-appearing.  HENT:     Head: Normocephalic and atraumatic.     Mouth/Throat:     Mouth: Mucous membranes are moist.     Pharynx: No oropharyngeal exudate or posterior oropharyngeal erythema.  Eyes:     General:        Right eye: No discharge.        Left eye: No discharge.     Conjunctiva/sclera: Conjunctivae normal.  Cardiovascular:     Rate and Rhythm: Normal rate and regular rhythm.     Pulses: Normal pulses.  Pulmonary:     Effort: Pulmonary effort is normal. No respiratory distress.     Breath sounds: Normal breath sounds. No wheezing or rales.  Abdominal:     General: Bowel sounds are normal. There is no distension.  Tenderness: There is abdominal tenderness in the right upper quadrant and epigastric area. There is no right CVA tenderness, left CVA tenderness, guarding or rebound. Positive signs include Murphy's sign.  Musculoskeletal:        General: No deformity.     Cervical back: Neck supple.  Skin:    General: Skin is warm and dry.     Capillary Refill: Capillary refill takes less than 2 seconds.  Neurological:     Mental Status: She is alert and oriented to person, place, and time. Mental status is at baseline.  Psychiatric:        Mood and Affect: Mood normal.     ED Results / Procedures / Treatments   Labs (all labs ordered are listed, but only abnormal results are displayed) Labs Reviewed  COMPREHENSIVE METABOLIC PANEL -  Abnormal; Notable for the following components:      Result Value   Potassium 3.0 (*)    Glucose, Bld 118 (*)    Calcium 8.7 (*)    AST 388 (*)    ALT 557 (*)    Alkaline Phosphatase 144 (*)    Total Bilirubin 1.3 (*)    All other components within normal limits  CBC - Abnormal; Notable for the following components:   Hemoglobin 11.3 (*)    Platelets 144 (*)    All other components within normal limits  LIPASE, BLOOD  URINALYSIS, ROUTINE W REFLEX MICROSCOPIC  I-STAT BETA HCG BLOOD, ED ([facility], [facility], [facility] ONLY)    EKG None  Radiology CT ABDOMEN PELVIS W CONTRAST  Result Date: 10/22/2021 CLINICAL DATA:  Epigastric pain and equivocal ultrasound. EXAM: CT ABDOMEN AND PELVIS WITH CONTRAST TECHNIQUE: Multidetector CT imaging of the abdomen and pelvis was performed using the standard protocol following bolus administration of intravenous contrast. RADIATION DOSE REDUCTION: This exam was performed according to the departmental dose-optimization program which includes automated exposure control, adjustment of the mA and/or kV according to patient size and/or use of iterative reconstruction technique. CONTRAST:  1103m OMNIPAQUE IOHEXOL 300 MG/ML  SOLN COMPARISON:  Right upper quadrant ultrasound from earlier today FINDINGS: Lower chest:  No contributory findings. Hepatobiliary: No focal liver abnormality.Cholelithiasis. No pericholecystic edema or bile duct dilatation. Pancreas: Unremarkable. Spleen: Unremarkable. Adrenals/Urinary Tract: Negative adrenals. No hydronephrosis or ureteral stone. Punctate right renal calculus. 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 Stomach/Bowel:  No obstruction. No appendicitis. Vascular/Lymphatic: No acute vascular abnormality. No mass or adenopathy. Reproductive:No pathologic findings. Other: No ascites or pneumoperitoneum. Musculoskeletal: No acute abnormalities. IMPRESSION: 1. No acute finding. 2. Cholelithiasis and small right renal calculus.  Electronically Signed   By: JJorje GuildM.D.   On: 10/22/2021 06:45   UKoreaAbdomen Limited RUQ (LIVER/GB)  Result Date: 10/22/2021 CLINICAL DATA:  Acute right upper quadrant pain EXAM: ULTRASOUND ABDOMEN LIMITED RIGHT UPPER QUADRANT COMPARISON:  None Available. FINDINGS: Gallbladder: Small stones and sludge identified within the gallbladder. The gallbladder wall thickness measures 3 mm. No pericholecystic fluid. Negative sonographic Murphy's sign. Common bile duct: Diameter: 3.2 mm. Liver: No focal lesion identified. Increased parenchymal echogenicity. Portal vein is patent on color Doppler imaging with normal direction of blood flow towards the liver. Other: None. IMPRESSION: 1. Small gallstones and gallbladder sludge identified. Gallbladder wall thickness is upper limits of normal at 3 mm. Findings are equivocal for early cholecystitis. If further imaging is clinically indicated consider nuclear medicine hepatic biliary scan to assess patency of the cystic duct 2. Increased parenchymal echogenicity of the liver compatible with  hepatic steatosis. Electronically Signed   By: Kerby Moors M.D.   On: 10/22/2021 05:28    Procedures Procedures    Medications Ordered in ED Medications  sodium chloride (PF) 0.9 % injection (has no administration in time range)  fentaNYL (SUBLIMAZE) injection 50 mcg (50 mcg Intravenous Given 10/22/21 0553)  iohexol (OMNIPAQUE) 300 MG/ML solution 100 mL (100 mLs Intravenous Contrast Given 10/22/21 5427)    ED Course/ Medical Decision Making/ A&P Clinical Course as of 10/22/21 0623  Tue Oct 22, 2021  0410 Urinalysis, Routine w reflex microscopic [RS]    Clinical Course User Index [RS] Emeline Darling, PA-C                           Medical Decision Making 41 year old female presents with concern for right upper quadrant pain that woke her from her sleep with associated nausea.  Vital signs are normal and intake.  Cardiopulmonary exams are reassuring,  abdominal exam with positive Murphy sign and exquisite right upper quadrant and epigastric tenderness palpation without CVAT, rebound, or guarding.  Neurovascular intact in all extremities.  The differential diagnosis for RUQ includes but is not limited to:  Cholelithiasis / choledocholithiasis / cholecystitis / cholangitis, hepatitis (eg. viral, alcoholic, toxic),liver abscess, pancreatitis, liver / pancreatic / biliary tract cancer, ischemic hepatopathy (shock liver), hepatic vein obstruction (Budd-Chiari syndrome), liver cell adenoma, peptic ulcer disease (duodenal), functional or nonulcer dyspepsia, right lower lobe pneumonia, pyelonephritis, urinary calculi,  Fitz-Hugh-Curtis syndrome (with pelvic inflammatory disease), herpes zoster, trauma or musculoskeletal pain, herniated disk, abdominal abscess, intestinal ischemia, physical or sexual abuse, ectopic pregnancy, IUP, Mittelschmerz, ovarian cyst/torsion, threatened/ievitable abortion, PID, endometriosis, molar pregnancy, heterotopic pregnancy, corpus luteum cyst, appendicitis, UTI/renal colic, IBD.    Amount and/or Complexity of Data Reviewed Labs: ordered. Decision-making details documented in ED Course.    Details: CBC with leukocytosis but with anemia of 11.3.  CMP with hypokalemia of 3.0, and concerning findings of transaminitis with AST/ALT 388/557.  Elevated alk phos to 144 and elevated total bilirubin to 1.3.  All of these biliary changes are new for this patient.  Radiology: ordered.    Details: Right upper quadrant ultrasound with equivocal findings with gallbladder wall thickness in the upper limits of normal at 3 mm.  Small gallstones and gallbladder sludge.  CT of the abdomen pelvis pending at time of shift change.  Risk Prescription drug management.  CT ordered to rule out primary hepatic lesion inciting transaminitis seen on labs, in context of history of malignancy.   Care of this patient signed out to oncoming ED provider C.  Groce, PA-C at time of shift change.  All pertinent HPI, physical exam, laboratory findings were discussed with him prior to my departure.  Patient will require gastroenterology consultation following completion of CT imaging.  Ultimately disposition planning and further consultation required to be determined by oncoming ED team pending completion of work-up and reevaluation of the patient.  Jaeleigh and her husband  voiced understanding of her medical evaluation and treatment plan thus far. Each of their questions answered to their expressed satisfaction.  This chart was dictated using voice recognition software, Dragon. Despite the best efforts of this provider to proofread and correct errors, errors may still occur which can change documentation meaning.  Final Clinical Impression(s) / ED Diagnoses Final diagnoses:  None    Rx / DC Orders ED Discharge Orders     None  Aura Dials 10/22/21 8833    Merrily Pew, MD 10/22/21 [phone]

## 2021-10-22 NOTE — Anesthesia Preprocedure Evaluation (Addendum)
Anesthesia Evaluation  Patient identified by MRN, date of birth, ID band Patient awake    Reviewed: Allergy & Precautions, NPO status , Patient's Chart, lab work & pertinent test results  History of Anesthesia Complications (+) PONV and history of anesthetic complications (hx  neg pressure pulm edema 2021)  Airway Mallampati: II  TM Distance: >3 FB Neck ROM: Full    Dental no notable dental hx. (+) Teeth Intact, Dental Advisory Given   Pulmonary    Pulmonary exam normal breath sounds clear to auscultation       Cardiovascular Exercise Tolerance: Good hypertension, Normal cardiovascular exam Rhythm:Regular Rate:Normal  04/2017 ECHO LV EF: 60% -   65%    Neuro/Psych  Headaches, Anxiety    GI/Hepatic negative GI ROS, Neg liver ROS,   Endo/Other  negative endocrine ROS  Renal/GU negative Renal ROSLab Results      Component                Value               Date                      CREATININE               0.71                10/22/2021                K                        3.0 (L)             10/22/2021                   Musculoskeletal   Abdominal (+) + obese (BMI 31.80),   Peds  Hematology Lab Results      Component                Value               Date                      WBC                      6.8                 10/22/2021                HGB                      11.3 (L)            10/22/2021                HCT                      36.5                10/22/2021                MCV                      87.7                10/22/2021                PLT  144 (L)             10/22/2021              Anesthesia Other Findings L breast CA S/P RADS  ALL: Amoxicillin  Reproductive/Obstetrics negative OB ROS                           Anesthesia Physical Anesthesia Plan  ASA: 3 and emergent  Anesthesia Plan: General   Post-op Pain Management: Toradol IV  (intra-op)*, Tylenol PO (pre-op)*, Precedex, Minimal or no pain anticipated and Dilaudid IV   Induction: Intravenous  PONV Risk Score and Plan: Aprepitant and Treatment may vary due to age or medical condition  Airway Management Planned: Oral ETT  Additional Equipment: None  Intra-op Plan:   Post-operative Plan: Extubation in OR  Informed Consent: I have reviewed the patients History and Physical, chart, labs and discussed the procedure including the risks, benefits and alternatives for the proposed anesthesia with the patient or authorized representative who has indicated his/her understanding and acceptance.     Dental advisory given  Plan Discussed with:   Anesthesia Plan Comments:       Anesthesia Quick Evaluation

## 2021-10-22 NOTE — Discharge Instructions (Signed)
[facility] ______[facility] LAPAROSCOPIC SURGERY: POST OP INSTRUCTIONS Always review your discharge instruction sheet given to you by the facility where your surgery was performed. IF YOU HAVE DISABILITY OR FAMILY LEAVE FORMS, YOU MUST BRING THEM TO THE OFFICE FOR PROCESSING.   DO NOT GIVE THEM TO YOUR DOCTOR.  A prescription for pain medication may be given to you upon discharge.  Take your pain medication as prescribed, if needed.  If narcotic pain medicine is not needed, then you may take acetaminophen (Tylenol) or ibuprofen (Advil) as needed. Take your usually prescribed medications unless otherwise directed. If you need a refill on your pain medication, please contact your pharmacy.  They will contact our office to request authorization. Prescriptions will not be filled after 5pm or on week-ends. You should follow a light diet the first few days after arrival home, such as soup and crackers, etc.  Be sure to include lots of fluids daily. Most patients will experience some swelling and bruising in the area of the incisions.  Ice packs will help.  Swelling and bruising can take several days to resolve.  It is common to experience some constipation if taking pain medication after surgery.  Increasing fluid intake and taking a stool softener (such as Colace) will usually help or prevent this problem from occurring.  A mild laxative (Milk of Magnesia or Miralax) should be taken according to package instructions if there are no bowel movements after 48 hours. Unless discharge instructions indicate otherwise, you may remove your bandages 24-48 hours after surgery, and you may shower at that time.  You may have steri-strips (small skin tapes) in place directly over the incision.  These strips should be left on the skin for 7-10 days.  If your surgeon used skin glue on the incision, you may shower in 24 hours.  The glue will flake off over the next 2-3 weeks.  Any sutures or staples will be  removed at the office during your follow-up visit. ACTIVITIES:  You may resume regular (light) daily activities beginning the next day--such as daily self-care, walking, climbing stairs--gradually increasing activities as tolerated.  You may have sexual intercourse when it is comfortable.  Refrain from any heavy lifting or straining until approved by your doctor. You may drive when you are no longer taking prescription pain medication, you can comfortably wear a seatbelt, and you can safely maneuver your car and apply brakes. RETURN TO WORK:  __________________________________________________________ You should see your doctor in the office for a follow-up appointment approximately 2-3 weeks after your surgery.  Make sure that you call for this appointment within a day or two after you arrive home to insure a convenient appointment time. OTHER INSTRUCTIONS: __________________________________________________________________________________________________________________________ __________________________________________________________________________________________________________________________ WHEN TO CALL YOUR DOCTOR: Fever over 101.0 Inability to urinate Continued bleeding from incision. Increased pain, redness, or drainage from the incision. Increasing abdominal pain  The clinic staff is available to answer your questions during regular business hours.  Please don't hesitate to call and ask to speak to one of the nurses for clinical concerns.  If you have a medical emergency, go to the nearest emergency room or call 911.  A surgeon from [facility] is always on call at the hospital. [address], [address], [place], [place]  [address] ? [address] [address], , [place]   [address] [phone] ? [phone] ? FAX [phone] Web site: [email or web address]  

## 2021-10-22 NOTE — ED Provider Notes (Signed)
  Physical Exam  BP 135/84 (BP Location: Right Arm)   Pulse 94   Temp 97.8 F (36.6 C) (Oral)   Resp 12   Ht '5\' 6"'$  (1.676 m)   Wt 89.4 kg   SpO2 98%   BMI 31.80 kg/m   Physical Exam  Procedures  Procedures  ED Course / MDM   Clinical Course as of 10/22/21 0756  Tue Oct 22, 2021  0410 Urinalysis, Routine w reflex microscopic [RS]    Clinical Course User Index [RS] Sponseller, Gypsy Balsam, PA-C   Medical Decision Making Amount and/or Complexity of Data Reviewed Labs: ordered. Decision-making details documented in ED Course. Radiology: ordered.  Risk Prescription drug management.   Patient signed out at shift change.  Please see previous provider note for further details.  In short this is a 41 year old female with history of breast cancer in remission, bilateral oophorectomy who presents with right upper quadrant pain rated as 10 out of 10 that began last night waking her from her sleep.  Ultrasound studies show upper limits of normal gallbladder wall thickening at 3 mm representing early acute cholecystitis.  The patient also has an elevation of her LFTs.  Bilirubin 1.3.  General surgery with Dr. Georgette Dover, has consulted on the patient and states that he will take the patient back today for cholecystectomy with cholangiogram.  Patient stable at this time and amenable to the plan.       Azucena Cecil, PA-C 10/22/21 0757    Teressa Lower, MD 10/23/21 1017

## 2021-10-22 NOTE — H&P (Signed)
Virginia [name] is an 41 y.o. female.   Chief Complaint: Abdominal pain, nausea HPI: This is a 41 year old female with a history of left breast cancer status post lumpectomy and sentinel lymph node biopsy followed by chemotherapy and radiation in 2017.  The patient has also had bilateral oophorectomy.  She presents with acute onset of right upper quadrant and epigastric abdominal pain radiating around to her back associated with nausea.  No vomiting.  No diarrhea.  She does feel bloated.  She has had similar mild episodes but no severe attacks such as this.  This episode woke her up from sleep.  She presented to the emergency department for evaluation.  Ultrasound showed multiple gallstones but no obvious evidence of acute cholecystitis.  CT scan showed gallstones but no sign of acute cholecystitis.  No other significant findings.  Past Medical History:  Diagnosis Date   Abnormal endometrial ultrasound    Anxiety    Complication of anesthesia    per anesthesia record , by Dr Jenita Seashore, for surgery done '@[facility]'$  [id number] pt had complication , laryngospasm w/ negative pressure pulmonary edema   Eczema    Family history of breast cancer    Family history of prostate cancer    History of chemotherapy    left breast cancer--- 04-10-2016  to 04-10-2017   History of COVID-19 02/2019   per pt mild symptoms that resovled   History of hypertension    per pt this was only due to chemotherapy,  resolved after chemo completec   History of hypothyroidism    with pregnancy   History of neuropathy    with chemo   History of radiation therapy    06-11-2016  to 07-23-2016---- Left Breast 50 Gy in 25 fractions, Left Breast boost 10 Gy in 5 fractions.   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   oncologist--- dr Jana Hakim (per lov note in epic 08-23-2020 pt released);  dx 11/ 2022, Stage IA, IDC, ER/PR/ HER positive;  completed chemo  04-10-2017 and radiation 07-23-2016   Migraine    PONV (postoperative nausea and vomiting)    Wears glasses     Past Surgical History:  Procedure Laterality Date   BREAST LUMPECTOMY WITH RADIOACTIVE SEED AND SENTINEL LYMPH NODE BIOPSY Left 03/13/2016   Procedure: LEFT BREAST LUMPECTOMY WITH RADIOACTIVE SEED X 2 AND SENTINEL LYMPH NODE BIOPSY;  Surgeon: Stark Klein, MD;  Location: [facility];  Service: General;  Laterality: Left;   HYSTEROSCOPY WITH D & C N/A 06/11/2021   Procedure: DILATATION AND CURETTAGE /HYSTEROSCOPY;  Surgeon: Megan Salon, MD;  Location: [facility];  Service: Gynecology;  Laterality: N/A;   PORT-A-CATH REMOVAL N/A 04/16/2017   Procedure: REMOVAL PORT-A-CATH;  Surgeon: Stark Klein, MD;  Location: [facility];  Service: General;  Laterality: N/A;   PORTACATH PLACEMENT Right 03/13/2016   Procedure: INSERTION PORT-A-CATH;  Surgeon: Stark Klein, MD;  Location: [facility];  Service: General;  Laterality: Right;   ROBOTIC ASSISTED SALPINGO OOPHERECTOMY Bilateral 02/02/2020   Procedure: XI ROBOTIC ASSISTED SALPINGO OOPHORECTOMY;  Surgeon: Lafonda Mosses, MD;  Location: [facility];  Service: Gynecology;  Laterality: Bilateral;    Family History  Problem Relation Age of Onset   Hypertension Mother    Hypertension Father    Prostate cancer Father    Liver cancer Maternal Grandmother    Breast cancer Paternal Grandmother        dx  in her 11s   Lung cancer Paternal Aunt        smoker   Heart attack Paternal Grandfather    Liver cancer Other    Social History:  reports that she has never smoked. She has never used smokeless tobacco. She reports that she does not currently use alcohol. She reports that she does not use drugs.  Allergies:  Allergies  Allergen Reactions   Amoxicillin Diarrhea and Nausea Only    Has patient had a PCN reaction causing immediate rash, facial/tongue/throat swelling, SOB or lightheadedness with hypotension: No Has patient had a PCN  reaction causing severe rash involving mucus membranes or skin necrosis: No Has patient had a PCN reaction that required hospitalization No Has patient had a PCN reaction occurring within the last 10 years: Yes If all of the above answers are "NO", then may proceed with Cephalosporin use.     Prior to Admission medications   Medication Sig Start Date End Date Taking? Authorizing Provider  acetaminophen (TYLENOL) 325 MG tablet Take 650 mg by mouth every 6 (six) hours as needed for moderate pain or headache.     [provider]  ibuprofen (ADVIL) 800 MG tablet Take 1 tablet (800 mg total) by mouth every 8 (eight) hours as needed. 06/11/21   Megan Salon, MD  Multiple Vitamins-Minerals (MULTIVITAMIN GUMMIES ADULT PO) Take 2 tablets by mouth daily.    [provider]  venlafaxine XR (EFFEXOR-XR) 150 MG 24 hr capsule TAKE 1 CAPSULE BY MOUTH EVERY MORNING WITH BREAKFAST Patient taking differently: 150 mg daily with breakfast. TAKE 1 CAPSULE BY MOUTH EVERY MORNING WITH BREAKFAST 08/23/20   Magrinat, Virgie Dad, MD  prochlorperazine (COMPAZINE) 10 MG tablet Take 1 tablet (10 mg total) by mouth every 6 (six) hours as needed (Nausea or vomiting). Patient not taking: Reported on 06/03/2016 03/24/16 06/13/16  Magrinat, Virgie Dad, MD     Results for orders placed or performed during the hospital encounter of 10/22/21 (from the past 48 hour(s))  Lipase, blood     Status: None   Collection Time: 10/22/21  3:03 AM  Result Value Ref Range   Lipase 28 11 - 51 U/L    Comment: Performed at [facility], [place] [address] [place], [place] [address]  Comprehensive metabolic panel     Status: Abnormal   Collection Time: 10/22/21  3:03 AM  Result Value Ref Range   Sodium 142 135 - 145 mmol/L   Potassium 3.0 (L) 3.5 - 5.1 mmol/L   Chloride 108 98 - 111 mmol/L   CO2 25 22 - 32 mmol/L   Glucose, Bld 118 (H) 70 - 99 mg/dL    Comment: Glucose reference range applies only to samples  taken after fasting for at least 8 hours.   BUN 10 6 - 20 mg/dL   Creatinine, Ser 0.71 0.44 - 1.00 mg/dL   Calcium 8.7 (L) 8.9 - 10.3 mg/dL   Total Protein 6.8 6.5 - 8.1 g/dL   Albumin 3.7 3.5 - 5.0 g/dL   AST 388 (H) 15 - 41 U/L   ALT 557 (H) 0 - 44 U/L   Alkaline Phosphatase 144 (H) 38 - 126 U/L   Total Bilirubin 1.3 (H) 0.3 - 1.2 mg/dL   GFR, Estimated >60 >60 mL/min    Comment: (NOTE) Calculated using the CKD-EPI Creatinine Equation (2021)    Anion gap 9 5 - 15    Comment: Performed at [facility], [place] [address] [place], [place] [address]  CBC     Status: Abnormal   Collection Time: 10/22/21  3:03 AM  Result Value Ref Range   WBC 6.8 4.0 - 10.5 K/uL   RBC 4.16 3.87 - 5.11 MIL/uL   Hemoglobin 11.3 (L) 12.0 - 15.0 g/dL   HCT 36.5 36.0 - 46.0 %   MCV 87.7 80.0 - 100.0 fL   MCH 27.2 26.0 - 34.0 pg   MCHC 31.0 30.0 - 36.0 g/dL   RDW 13.1 11.5 - 15.5 %   Platelets 144 (L) 150 - 400 K/uL   nRBC 0.0 0.0 - 0.2 %    Comment: Performed at [facility], [place] [address] [place], [place] [address]  I-Stat beta hCG blood, ED     Status: None   Collection Time: 10/22/21  3:37 AM  Result Value Ref Range   I-stat hCG, quantitative <5.0 <5 mIU/mL   Comment 3            Comment:   GEST. AGE      CONC.  (mIU/mL)   <=1 WEEK        5 - 50     2 WEEKS       50 - 500     3 WEEKS       100 - 10,000     4 WEEKS     1,000 - 30,000        FEMALE AND NON-PREGNANT FEMALE:     LESS THAN 5 mIU/mL    CT ABDOMEN PELVIS W CONTRAST  Result Date: 10/22/2021 CLINICAL DATA:  Epigastric pain and equivocal ultrasound. EXAM: CT ABDOMEN AND PELVIS WITH CONTRAST TECHNIQUE: Multidetector CT imaging of the abdomen and pelvis was performed using the standard protocol following bolus administration of intravenous contrast. RADIATION DOSE REDUCTION: This exam was performed according to the departmental dose-optimization program which includes automated exposure control,  adjustment of the mA and/or kV according to patient size and/or use of iterative reconstruction technique. CONTRAST:  157m OMNIPAQUE IOHEXOL 300 MG/ML  SOLN COMPARISON:  Right upper quadrant ultrasound from earlier today FINDINGS: Lower chest:  No contributory findings. Hepatobiliary: No focal liver abnormality.Cholelithiasis. No pericholecystic edema or bile duct dilatation. Pancreas: Unremarkable. Spleen: Unremarkable. Adrenals/Urinary Tract: Negative adrenals. No hydronephrosis or ureteral stone. Punctate right renal calculus. 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 Stomach/Bowel:  No obstruction. No appendicitis. Vascular/Lymphatic: No acute vascular abnormality. No mass or adenopathy. Reproductive:No pathologic findings. Other: No ascites or pneumoperitoneum. Musculoskeletal: No acute abnormalities. IMPRESSION: 1. No acute finding. 2. Cholelithiasis and small right renal calculus. Electronically Signed   By: JJorje GuildM.D.   On: 10/22/2021 06:45   UKoreaAbdomen Limited RUQ (LIVER/GB)  Result Date: 10/22/2021 CLINICAL DATA:  Acute right upper quadrant pain EXAM: ULTRASOUND ABDOMEN LIMITED RIGHT UPPER QUADRANT COMPARISON:  None Available. FINDINGS: Gallbladder: Small stones and sludge identified within the gallbladder. The gallbladder wall thickness measures 3 mm. No pericholecystic fluid. Negative sonographic Murphy's sign. Common bile duct: Diameter: 3.2 mm. Liver: No focal lesion identified. Increased parenchymal echogenicity. Portal vein is patent on color Doppler imaging with normal direction of blood flow towards the liver. Other: None. IMPRESSION: 1. Small gallstones and gallbladder sludge identified. Gallbladder wall thickness is upper limits of normal at 3 mm. Findings are equivocal for early cholecystitis. If further imaging is clinically indicated consider nuclear medicine hepatic biliary scan to assess patency of the cystic duct 2. Increased parenchymal echogenicity  of the liver compatible  with hepatic steatosis. Electronically Signed   By: Kerby Moors M.D.   On: 10/22/2021 05:28    Review of Systems  HENT:  Negative for ear discharge, ear pain, hearing loss and tinnitus.   Eyes:  Negative for photophobia and pain.  Respiratory:  Negative for cough and shortness of breath.   Cardiovascular:  Negative for chest pain.  Gastrointestinal:  Positive for abdominal distention, abdominal pain and nausea. Negative for vomiting.  Genitourinary:  Negative for dysuria, flank pain, frequency and urgency.  Musculoskeletal:  Negative for back pain, myalgias and neck pain.  Neurological:  Negative for dizziness and headaches.  Hematological:  Does not bruise/bleed easily.  Psychiatric/Behavioral:  The patient is not nervous/anxious.     Blood pressure 135/84, pulse 94, temperature 97.8 F (36.6 C), temperature source Oral, resp. rate 12, height '5\' 6"'$  (1.676 m), weight 89.4 kg, SpO2 98 %. Physical Exam  Constitutional:  WDWN in NAD, conversant, no obvious deformities; lying in bed comfortably Eyes:  Pupils equal, round; sclera anicteric; moist conjunctiva; no lid lag HENT:  Oral mucosa moist; good dentition  Neck:  No masses palpated, trachea midline; no thyromegaly Lungs:  CTA bilaterally; normal respiratory effort CV:  Regular rate and rhythm; no murmurs; extremities well-perfused with no edema Abd:  +bowel sounds, soft, tender in right upper quadrant, no palpable organomegaly; no palpable hernias Musc:  Unable to assess gait; no apparent clubbing or cyanosis in extremities Lymphatic:  No palpable cervical or axillary lymphadenopathy Skin:  Warm, dry; no sign of jaundice Psychiatric - alert and oriented x 4; calm mood and affect  Assessment/Plan Acute calculus cholecystitis Elevated LFTs, likely secondary to acute cholecystitis.  We will obtain cholangiogram during surgery to rule out choledocholithiasis  Recommend laparoscopic cholecystectomy with  intraoperative cholangiogram. The surgical procedure has been discussed with the patient.  Potential risks, benefits, alternative treatments, and expected outcomes have been explained.  All of the patient's questions at this time have been answered.  The likelihood of reaching the patient's treatment goal is good.  The patient understand the proposed surgical procedure and wishes to proceed.  We will add her to the schedule today.  We will plan to discharge her home from the recovery room without need for admission to the hospital.   Maia Petties, MD 10/22/2021, 7:55 AM

## 2021-10-22 NOTE — Transfer of Care (Signed)
Immediate Anesthesia Transfer of Care Note  Patient: Virginia [name]  Procedure(s) Performed: LAPAROSCOPIC CHOLECYSTECTOMY WITH INTRAOPERATIVE CHOLANGIOGRAM (Abdomen)  Patient Location: PACU  Anesthesia Type:General  Level of Consciousness: sedated, drowsy and patient cooperative  Airway & Oxygen Therapy: Patient Spontanous Breathing and Patient connected to face mask oxygen  Post-op Assessment: Report given to RN and Post -op Vital signs reviewed and stable  Post vital signs: stable  Last Vitals:  Vitals Value Taken Time  BP 128/82 10/22/21 1107  Temp    Pulse 69 10/22/21 1108  Resp 12 10/22/21 1108  SpO2 100 % 10/22/21 1108  Vitals shown include unvalidated device data.  Last Pain:  Vitals:   10/22/21 0728  TempSrc:   PainSc: 10-Worst pain ever         Complications: No notable events documented.

## 2021-10-22 NOTE — ED Triage Notes (Addendum)
Patient arrives for abdominal pain/ epigatric pain starting at 12am. Pain is 10/10. With tenderness in the right upper quadrant. Patient still has her appendix. Patient is having intermittent nausea. Zofran given with EMS. 20G IV RAC. Left arm restriction from breast cancer. Patient had 324 ASA on scene.

## 2021-10-23 ENCOUNTER — Encounter (HOSPITAL_COMMUNITY): Payer: Self-pay | Admitting: Surgery

## 2021-10-23 LAB — SURGICAL PATHOLOGY

## 2021-10-23 NOTE — Anesthesia Postprocedure Evaluation (Signed)
Anesthesia Post Note  Patient: Virginia [name]  Procedure(s) Performed: LAPAROSCOPIC CHOLECYSTECTOMY WITH INTRAOPERATIVE CHOLANGIOGRAM (Abdomen)     Patient location during evaluation: PACU Anesthesia Type: General Level of consciousness: awake and sedated Pain management: pain level controlled Vital Signs Assessment: post-procedure vital signs reviewed and stable Respiratory status: spontaneous breathing Cardiovascular status: stable Postop Assessment: no apparent nausea or vomiting Anesthetic complications: no   No notable events documented.  Last Vitals:  Vitals:   10/22/21 1145 10/22/21 1230  BP: 128/80 (!) 141/91  Pulse: 88 81  Resp: 12 15  Temp:  36.7 C  SpO2: 93% 94%    Last Pain:  Vitals:   10/22/21 1230  TempSrc:   PainSc: 4    Pain Goal:                   Huston Foley

## 2021-11-13 ENCOUNTER — Ambulatory Visit (INDEPENDENT_AMBULATORY_CARE_PROVIDER_SITE_OTHER): Payer: 59

## 2021-11-13 ENCOUNTER — Ambulatory Visit (INDEPENDENT_AMBULATORY_CARE_PROVIDER_SITE_OTHER): Payer: 59 | Admitting: Obstetrics & Gynecology

## 2021-11-13 ENCOUNTER — Encounter (HOSPITAL_BASED_OUTPATIENT_CLINIC_OR_DEPARTMENT_OTHER): Payer: Self-pay | Admitting: Obstetrics & Gynecology

## 2021-11-13 ENCOUNTER — Other Ambulatory Visit (HOSPITAL_COMMUNITY)
Admission: RE | Admit: 2021-11-13 | Discharge: 2021-11-13 | Disposition: A | Discharge status: Home / Self Care | Payer: 59 | Source: Ambulatory Visit | Attending: Obstetrics & Gynecology | Admitting: Obstetrics & Gynecology

## 2021-11-13 VITALS — BP 134/91 | HR 62 | Ht 67.0 in | Wt 200.2 lb

## 2021-11-13 DIAGNOSIS — N8501 Benign endometrial hyperplasia: Secondary | ICD-10-CM

## 2021-11-13 DIAGNOSIS — R935 Abnormal findings on diagnostic imaging of other abdominal regions, including retroperitoneum: Secondary | ICD-10-CM

## 2021-11-14 ENCOUNTER — Encounter (HOSPITAL_BASED_OUTPATIENT_CLINIC_OR_DEPARTMENT_OTHER): Payer: Self-pay | Admitting: Obstetrics & Gynecology

## 2021-11-15 LAB — SURGICAL PATHOLOGY

## 2021-11-16 ENCOUNTER — Encounter (HOSPITAL_BASED_OUTPATIENT_CLINIC_OR_DEPARTMENT_OTHER): Payer: Self-pay | Admitting: Obstetrics & Gynecology

## 2021-11-16 NOTE — Progress Notes (Signed)
GYNECOLOGY  VISIT  CC:   follow up after ultrasound and endometrial biopsy  HPI: 41 y.o. G2P2002 Married Other or two or more races female here for re-evaluation of the endometrium after undergoing hysteroscopy 05/2021 with pathology showing glandular crowding approaching endometrial hyperplasia.    Denies vaginal bleeding.  Pt did have cholecystectomy in August.  Is back at work at [employer].  Happier with current job.  Findings reviewed with pt.  Endometrium appears to be about 5.54m but with initial imaging, the endometrium does appear thicker more like imaging earlier this year.  However, with hysteroscopy, endometrium was atrophic in appearance.     Past Medical History:  Diagnosis Date   Abnormal endometrial ultrasound    Anxiety    Complication of anesthesia    per anesthesia record , by Dr CJenita Seashore for surgery done '@[facility]'$  [id number] had complication , laryngospasm w/ negative pressure pulmonary edema   Eczema    Family history of breast cancer    Family history of prostate cancer    History of chemotherapy    left breast cancer--- 04-10-2016  to 04-10-2017   History of COVID-19 02/2019   per pt mild symptoms that resovled   History of hypertension    per pt this was only due to chemotherapy,  resolved after chemo completec   History of hypothyroidism    with pregnancy   History of neuropathy    with chemo   History of radiation therapy    06-11-2016  to 07-23-2016---- Left Breast 50 Gy in 25 fractions, Left Breast boost 10 Gy in 5 fractions.   Lymphedema of arm    LEFT   Malignant neoplasm of upper-inner quadrant of left female breast ([place] 01/16/2016   oncologist--- dr mJana Hakim(per lov note in epic 08-23-2020 pt released);  dx 11/ 2022, Stage IA, IDC, ER/PR/ HER positive;  completed chemo 04-10-2017 and radiation 07-23-2016   Migraine    PONV (postoperative nausea and vomiting)    Wears glasses     MEDS:   Current Outpatient Medications on File Prior to Visit   Medication Sig Dispense Refill   acetaminophen (TYLENOL) 325 MG tablet Take 650 mg by mouth every 6 (six) hours as needed for moderate pain or headache.      aspirin 81 MG chewable tablet Chew 324 mg by mouth once.     ibuprofen (ADVIL) 800 MG tablet Take 1 tablet (800 mg total) by mouth every 8 (eight) hours as needed. 30 tablet 0   Multiple Vitamins-Minerals (MULTIVITAMIN GUMMIES ADULT PO) Take 2 tablets by mouth daily.     oxyCODONE (OXY IR/ROXICODONE) 5 MG immediate release tablet Take 1 tablet (5 mg total) by mouth every 6 (six) hours as needed for severe pain. 20 tablet 0   [DISCONTINUED] prochlorperazine (COMPAZINE) 10 MG tablet Take 1 tablet (10 mg total) by mouth every 6 (six) hours as needed (Nausea or vomiting). (Patient not taking: Reported on 06/03/2016) 30 tablet 1   No current facility-administered medications on file prior to visit.    ALLERGIES: Amoxicillin  SH:  married, non smoker  Review of Systems  Constitutional: Negative.     PHYSICAL EXAMINATION:    BP (!) 134/91 (BP Location: Right Arm, Patient Position: Sitting, Cuff Size: Large)   Pulse 62   Ht '5\' 7"'$  (1.702 m) Comment: Reported  Wt 200 lb 3.2 oz (90.8 kg)   BMI 31.36 kg/m     General appearance: alert, cooperative and appears stated age  Pelvic:  External genitalia:  no lesions              Urethra:  normal appearing urethra with no masses, tenderness or lesions              Bartholins and Skenes: normal                 Vagina: normal appearing vagina with normal color and discharge, no lesions              Cervix: no lesions            Endometrial biopsy recommended.  Discussed with patient.  Verbal and written consent obtained.   Procedure:  Speculum placed.  Cervix visualized and cleansed with betadine prep.  A single toothed tenaculum was applied to the anterior lip of the cervix.  Endometrial pipelle was advanced through the cervix into the endometrial cavity without difficulty.  Pipelle passed  to6.5cm.  Suction applied and pipelle removed with scant tissue sample obtained.  Second pass performed to obtained better specimen.  Tenaculum removed.  No bleeding noted.  Patient tolerated procedure well.  Chaperone, Ashely Friday, was present for exam.  Assessment/Plan: 1. Simple endometrial hyperplasia without atypia - ultrasound reassuring today - Surgical pathology( [place]) - pt is considering hysterectomy but with recent surgery will need to wait

## 2021-11-19 ENCOUNTER — Telehealth: Payer: Self-pay | Admitting: Oncology

## 2021-11-19 NOTE — Telephone Encounter (Signed)
Scheduled appointment per 9/26 staff message. Left voicemail.

## 2021-11-20 NOTE — Addendum Note (Signed)
Addended by: Megan Salon on: 11/20/2021 05:37 AM   Modules accepted: Orders

## 2021-12-11 ENCOUNTER — Ambulatory Visit: Payer: 59 | Admitting: Hematology and Oncology

## 2021-12-19 ENCOUNTER — Ambulatory Visit: Payer: 59 | Admitting: Hematology and Oncology

## 2022-01-02 NOTE — Progress Notes (Signed)
Patient Care Team: [name], MD (Inactive) as PCP - General (Family Medicine) Stark Klein, MD as Consulting Physician (General Surgery) Magrinat, Virgie Dad, MD (Inactive) as Consulting Physician (Oncology) Eppie Gibson, MD as Attending Physician (Radiation Oncology) Gardenia Phlegm, NP as Nurse Practitioner (Hematology and Oncology)  DIAGNOSIS:  Encounter Diagnosis  Name Primary?   Malignant neoplasm of upper-inner quadrant of left breast in female, estrogen receptor positive ([place]) Yes    SUMMARY OF ONCOLOGIC HISTORY: Oncology History  Malignant neoplasm of upper-inner quadrant of left breast in female, estrogen receptor positive ([place])  01/15/2016 Initial Biopsy   Left breast upper inner quadrant biopsy: IDC, ER/PR positive, Ki-67 5%, HER-2 positive (ratio 2.41).  One of 2 areas of abnormal calcifications, more posterior one, was biopsied and showed atypical ductal hyperplasia, and a 4.1cm area of non mass like enhancement in the left breat underwent biopsy showing fibrocystic changes.    01/16/2016 Initial Diagnosis   Malignant neoplasm of upper-inner quadrant of left female breast ([employer])   12/2015 -  Anti-estrogen oral therapy   Tamoxifen daily, held during chemotherapy and radiation, and resumed on 07/2016   02/22/2016 Genetic Testing   Patient has genetic testing done for breast cancer. Negative genetic testing on a custom breast cancer panel.  Negative genetic testing for the MSH2 inversion analysis ([place] inversion). The Custom gene panel offered by [facility] includes sequencing and rearrangement analysis for the following 21 genes:  ATM, BARD1, BRCA1, BRCA2, BRIP1, CDH1, CHEK2, EPCAM, FANCC, HOXB13, MLH1, MSH2, MSH6, NBN, PALB2, PMS2, PTEN, RAD51C, RAD51D, TP53, and XRCC2.   The report date is February 22, 2016.   03/13/2016 Surgery   Left lumpectomy and SLNB: IDC, grade 2, multiple foci, 1.3 cm, 0.8 cm, and 0.5 cm, margins negative, 2 SLN negative   04/10/2016  - 04/10/2017 Chemotherapy   Paclitaxel weekly and Trastuzumab every 3 weeks.  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  Completed one year of maintenance Trastuzumab    06/11/2016 - 07/23/2016 Radiation Therapy   1) Left Breast / 50 Gy in 25 fractions.  2) Left Breast Boost / 10 Gy in 5 fractions.     05/2016 Miscellaneous   Due to resumed menses, Goserelin started, initially given every 4 weeks, however changed to every 12 weeks with estradiol monitoring.       CHIEF COMPLIANT: Follow-up Left breast cancer surveillance on tamoxifen  INTERVAL HISTORY: Virginia [name] is a 41 y.o with the above-mentioned left breast cancer surveillance, currently on tamoxifen. She presents to the clinic for a follow-up and Establish oncology care with Dr. Lindi Adie. She reports that her energy her baseline is kind of low and depends on how her work flow is. Fatigue is defiantly down. She complains of soreness when she wears a compression bra. She had some leakage around the vaginal. She denies neuropathy. She does get time for exercise. She has a treadmill and a bike that she uses at home.     ALLERGIES:  is allergic to amoxicillin.  MEDICATIONS:  Current Outpatient Medications  Medication Sig Dispense Refill   acetaminophen (TYLENOL) 325 MG tablet Take 650 mg by mouth every 6 (six) hours as needed for moderate pain or headache.      Multiple Vitamins-Minerals (MULTIVITAMIN GUMMIES ADULT PO) Take 2 tablets by mouth daily.     No current facility-administered medications for this visit.    PHYSICAL EXAMINATION: ECOG PERFORMANCE STATUS: 1 - Symptomatic but completely ambulatory  Vitals:   01/03/22 0932  BP: 132/81  Pulse: 71  Resp: 18  Temp: (!) 97.5 F (36.4 C)  SpO2: 97%   Filed Weights   01/03/22 0932  Weight: 200 lb 1.6 oz (90.8 kg)    BREAST: No palpable masses or nodules in either right or left breasts. No palpable axillary supraclavicular or infraclavicular  adenopathy no breast tenderness or nipple discharge. (exam performed in the presence of a chaperone)  LABORATORY DATA:  I have reviewed the data as listed    Latest Ref Rng & Units 10/22/2021    3:03 AM 06/07/2021    8:37 AM 08/23/2020    2:03 PM  CMP  Glucose 70 - 99 mg/dL 118  81  81   BUN 6 - 20 mg/dL _0 Creatinine 0.44 - 1.00 mg/dL 0.71  0.72  0.88   Sodium 135 - 145 mmol/L 142  139  140   Potassium 3.5 - 5.1 mmol/L 3.0  3.6  4.3   Chloride 98 - 111 mmol/L 108  106  104   CO2 22 - 32 mmol/L _1 Calcium 8.9 - 10.3 mg/dL 8.7  8.9  9.9   Total Protein 6.5 - 8.1 g/dL 6.8   7.9   Total Bilirubin 0.3 - 1.2 mg/dL 1.3   <0.2   Alkaline Phos 38 - 126 U/L 144   79   AST 15 - 41 U/L 388   16   ALT 0 - 44 U/L 557   18     Lab Results  Component Value Date   WBC 6.8 10/22/2021   HGB 11.3 (L) 10/22/2021   HCT 36.5 10/22/2021   MCV 87.7 10/22/2021   PLT 144 (L) 10/22/2021   NEUTROABS 2.9 08/23/2020    ASSESSMENT & PLAN:  Malignant neoplasm of upper-inner quadrant of left breast in female, estrogen receptor positive ([place]) Dr. Jana Hakim patient establishing oncology care with me 01/15/2016: T1CN0 stage Ia IDC ER 90% PR 40% Ki-67 5%, HER2 positive ratio 2.41, copy #6.5 01/23/2016: Neoadjuvant tamoxifen 02/22/2016: Genetics: Negative 03/13/2016: Left lumpectomy: T1CN0 stage Ia grade 2 IDC with negative margins 04/10/2016: Taxol Herceptin weekly x5 (stopped for neuropathy 05/08/2021) followed by Herceptin maintenance 07/23/2016: Completed adjuvant radiation December 2022: Completed 5 years of tamoxifen 02/01/2021: Bilateral salpingo-oophorectomy  Breast cancer surveillance: Breast exam 01/03/2022: Benign Mammogram 01/21/2021 [facility]: Benign breast density category B  Endometrial hyperplasia with vaginal discharge: Patient is following with Dr. Sabra Heck with gynecology.  I discussed with her that it is unlikely to be related to tamoxifen since tamoxifen was stopped a year ago  and she still has a hyperplasia.  It could be related to the fact that she had to go through chemo induced menopause which has not resumed afterwards and that may be the cause of the hypertrophy.  Return to clinic on an as-needed basis.    No orders of the defined types were placed in this encounter.  The patient has a good understanding of the overall plan. she agrees with it. she will call with any problems that may develop before the next visit here. Total time spent: 30 mins including face to face time and time spent for planning, charting and co-ordination of care   Harriette Ohara, MD 01/03/22    I Gardiner Coins am scribing for Dr. Lindi Adie  I have reviewed the above documentation for accuracy and completeness, and I agree with the above.

## 2022-01-03 ENCOUNTER — Other Ambulatory Visit: Payer: Self-pay

## 2022-01-03 ENCOUNTER — Inpatient Hospital Stay: Payer: 59 | Attending: Hematology and Oncology | Admitting: Hematology and Oncology

## 2022-01-03 VITALS — BP 132/81 | HR 71 | Temp 97.5°F | Resp 18 | Ht 67.0 in | Wt 200.1 lb

## 2022-01-03 DIAGNOSIS — Z79810 Long term (current) use of selective estrogen receptor modulators (SERMs): Secondary | ICD-10-CM | POA: Insufficient documentation

## 2022-01-03 DIAGNOSIS — Z9221 Personal history of antineoplastic chemotherapy: Secondary | ICD-10-CM | POA: Diagnosis not present

## 2022-01-03 DIAGNOSIS — Z923 Personal history of irradiation: Secondary | ICD-10-CM | POA: Diagnosis not present

## 2022-01-03 DIAGNOSIS — C50212 Malignant neoplasm of upper-inner quadrant of left female breast: Secondary | ICD-10-CM

## 2022-01-03 DIAGNOSIS — Z170 Estrogen receptor positive status [ER+]: Secondary | ICD-10-CM | POA: Diagnosis not present

## 2022-01-03 NOTE — Assessment & Plan Note (Signed)
Dr. Jana Hakim patient establishing oncology care with me 01/15/2016: T1CN0 stage Ia IDC ER 90% PR 40% Ki-67 5%, HER2 positive ratio 2.41, copy #6.5 01/23/2016: Neoadjuvant tamoxifen 02/22/2016: Genetics: Negative 03/13/2016: Left lumpectomy: T1CN0 stage Ia grade 2 IDC with negative margins 04/10/2016: Taxol Herceptin weekly x5 (stopped for neuropathy 05/08/2021) followed by Herceptin maintenance 07/23/2016: Completed adjuvant radiation December 2022: Completed 5 years of tamoxifen 02/01/2021: Bilateral salpingo-oophorectomy  Breast cancer surveillance: Breast exam 01/03/2022: Benign Mammogram 01/21/2021 [facility]: Benign breast density category B  Return to clinic in 1 year for follow-up

## 2022-01-06 ENCOUNTER — Encounter: Payer: Self-pay | Admitting: Hematology and Oncology

## 2022-01-15 ENCOUNTER — Other Ambulatory Visit (HOSPITAL_BASED_OUTPATIENT_CLINIC_OR_DEPARTMENT_OTHER): Payer: BC Managed Care – PPO | Admitting: Obstetrics & Gynecology

## 2022-01-15 ENCOUNTER — Other Ambulatory Visit (HOSPITAL_BASED_OUTPATIENT_CLINIC_OR_DEPARTMENT_OTHER): Payer: BC Managed Care – PPO

## 2022-04-16 ENCOUNTER — Other Ambulatory Visit (HOSPITAL_BASED_OUTPATIENT_CLINIC_OR_DEPARTMENT_OTHER): Payer: Self-pay | Admitting: Obstetrics & Gynecology

## 2022-04-16 DIAGNOSIS — N8501 Benign endometrial hyperplasia: Secondary | ICD-10-CM

## 2022-04-17 ENCOUNTER — Telehealth (HOSPITAL_BASED_OUTPATIENT_CLINIC_OR_DEPARTMENT_OTHER): Payer: Self-pay | Admitting: *Deleted

## 2022-04-17 NOTE — Telephone Encounter (Signed)
LMOVM for pt to call office to set up appt for ultrasound

## 2022-04-21 ENCOUNTER — Telehealth (HOSPITAL_BASED_OUTPATIENT_CLINIC_OR_DEPARTMENT_OTHER): Payer: Self-pay | Admitting: *Deleted

## 2022-04-21 NOTE — Telephone Encounter (Signed)
LMOVM for pt to call office to schedule appt for u/s

## 2022-04-21 NOTE — Telephone Encounter (Signed)
-----   Message from Megan Salon, MD sent at 04/16/2022  1:39 AM EST ----- Regarding: ultrasound Virginia [name], This pt needs a repeat ultrasound due to simple endometrial hyperplasia.  Can you call her and get it scheduled?  Order has been placed.  Thank you.  MSM

## 2022-04-21 NOTE — Telephone Encounter (Signed)
Patient called and stated that the ultrasound will need to be delayed til September due to insurance.

## 2022-10-17 ENCOUNTER — Ambulatory Visit (HOSPITAL_BASED_OUTPATIENT_CLINIC_OR_DEPARTMENT_OTHER): Payer: 59 | Admitting: Obstetrics & Gynecology

## 2022-11-06 ENCOUNTER — Other Ambulatory Visit (HOSPITAL_BASED_OUTPATIENT_CLINIC_OR_DEPARTMENT_OTHER): Payer: Self-pay | Admitting: Obstetrics & Gynecology

## 2022-11-06 DIAGNOSIS — N8501 Benign endometrial hyperplasia: Secondary | ICD-10-CM

## 2022-11-06 DIAGNOSIS — N950 Postmenopausal bleeding: Secondary | ICD-10-CM

## 2022-12-23 DIAGNOSIS — J018 Other acute sinusitis: Secondary | ICD-10-CM | POA: Diagnosis not present

## 2022-12-23 DIAGNOSIS — B9689 Other specified bacterial agents as the cause of diseases classified elsewhere: Secondary | ICD-10-CM | POA: Diagnosis not present

## 2022-12-23 DIAGNOSIS — R519 Headache, unspecified: Secondary | ICD-10-CM | POA: Diagnosis not present

## 2022-12-23 DIAGNOSIS — J069 Acute upper respiratory infection, unspecified: Secondary | ICD-10-CM | POA: Diagnosis not present

## 2023-01-10 DIAGNOSIS — Z1231 Encounter for screening mammogram for malignant neoplasm of breast: Secondary | ICD-10-CM | POA: Diagnosis not present

## 2023-02-05 ENCOUNTER — Telehealth (HOSPITAL_BASED_OUTPATIENT_CLINIC_OR_DEPARTMENT_OTHER): Payer: Self-pay | Admitting: *Deleted

## 2023-02-05 NOTE — Telephone Encounter (Signed)
Pt returned call about scheduling ultrasound. Pt stated in message that she does not currently have insurance and will call back to schedule when her insurance becomes active.

## 2023-06-02 ENCOUNTER — Ambulatory Visit (HOSPITAL_BASED_OUTPATIENT_CLINIC_OR_DEPARTMENT_OTHER): Payer: Self-pay | Admitting: Obstetrics & Gynecology

## 2023-07-26 DIAGNOSIS — Z1211 Encounter for screening for malignant neoplasm of colon: Secondary | ICD-10-CM | POA: Diagnosis not present

## 2023-07-31 LAB — COLOGUARD: COLOGUARD: NEGATIVE

## 2023-08-10 ENCOUNTER — Encounter (HOSPITAL_BASED_OUTPATIENT_CLINIC_OR_DEPARTMENT_OTHER): Payer: Self-pay | Admitting: Obstetrics & Gynecology

## 2023-08-31 ENCOUNTER — Telehealth (HOSPITAL_BASED_OUTPATIENT_CLINIC_OR_DEPARTMENT_OTHER): Payer: Self-pay

## 2023-08-31 ENCOUNTER — Encounter (HOSPITAL_BASED_OUTPATIENT_CLINIC_OR_DEPARTMENT_OTHER): Payer: Self-pay

## 2023-08-31 NOTE — Telephone Encounter (Signed)
 Called patient to relay message that was sent via MyChart by Dr. Cleotilde. LMOM at 9:55 for patient to give office a call.     Virginia [name], Just wanted to reach out and see if you were in a place where you could proceed with the repeat vaginal ultrasound.  Just let me know, please, either way.  Thank you.  Dr. Cleotilde

## 2023-09-04 ENCOUNTER — Encounter (HOSPITAL_BASED_OUTPATIENT_CLINIC_OR_DEPARTMENT_OTHER): Payer: Self-pay | Admitting: Obstetrics & Gynecology

## 2023-09-04 ENCOUNTER — Ambulatory Visit (HOSPITAL_BASED_OUTPATIENT_CLINIC_OR_DEPARTMENT_OTHER): Payer: Self-pay | Admitting: Obstetrics & Gynecology

## 2023-09-04 ENCOUNTER — Other Ambulatory Visit (HOSPITAL_COMMUNITY)
Admission: RE | Admit: 2023-09-04 | Discharge: 2023-09-04 | Disposition: A | Discharge status: Home / Self Care | Payer: Self-pay | Source: Ambulatory Visit | Attending: Obstetrics & Gynecology | Admitting: Obstetrics & Gynecology

## 2023-09-04 VITALS — BP 130/90 | HR 64 | Wt 202.2 lb

## 2023-09-04 DIAGNOSIS — Z124 Encounter for screening for malignant neoplasm of cervix: Secondary | ICD-10-CM

## 2023-09-04 DIAGNOSIS — Z01419 Encounter for gynecological examination (general) (routine) without abnormal findings: Secondary | ICD-10-CM

## 2023-09-04 DIAGNOSIS — N8501 Benign endometrial hyperplasia: Secondary | ICD-10-CM | POA: Insufficient documentation

## 2023-09-04 DIAGNOSIS — K760 Fatty (change of) liver, not elsewhere classified: Secondary | ICD-10-CM

## 2023-09-04 DIAGNOSIS — Z170 Estrogen receptor positive status [ER+]: Secondary | ICD-10-CM

## 2023-09-04 DIAGNOSIS — C50212 Malignant neoplasm of upper-inner quadrant of left female breast: Secondary | ICD-10-CM | POA: Diagnosis not present

## 2023-09-04 DIAGNOSIS — Z8719 Personal history of other diseases of the digestive system: Secondary | ICD-10-CM | POA: Diagnosis not present

## 2023-09-04 DIAGNOSIS — Z8759 Personal history of other complications of pregnancy, childbirth and the puerperium: Secondary | ICD-10-CM | POA: Diagnosis not present

## 2023-09-04 NOTE — Progress Notes (Unsigned)
   ANNUAL EXAM Patient name: Virginia [name] MRN [id number]  Date of birth: Dec 05, 1980 Chief Complaint:   Gynecologic Exam  History of Present Illness:   Virginia [name] is a 43 y.o. G56P2002 African-American female being seen today for a routine annual exam.    No LMP recorded. (Menstrual status: Chemotherapy).   The pregnancy intention screening data noted above was reviewed. Potential methods of contraception were discussed. The patient elected to proceed with No data recorded.   Last pap ***. Results were: {Pap findings:25134}. H/O abnormal pap: {yes/yes***/no:23866} Last mammogram: ***. Results were: {normal, abnormal, n/a:23837}. Family h/o breast cancer: {yes***/no:23838} Last colonoscopy: ***. Results were: {normal, abnormal, n/a:23837}. Family h/o colorectal cancer: {yes***/no:23838}     11/13/2021    4:08 PM 07/16/2021    2:16 PM 05/30/2021    4:27 PM 03/13/2021    2:15 PM 08/22/2016    3:16 PM  Depression screen PHQ 2/9  Decreased Interest 0 0 0 0 0  Down, Depressed, Hopeless 0 0 0 0 0  PHQ - 2 Score 0 0 0 0 0         No data to display           Review of Systems:   Pertinent items are noted in HPI Denies any headaches, blurred vision, fatigue, shortness of breath, chest pain, abdominal pain, abnormal vaginal discharge/itching/odor/irritation, problems with periods, bowel movements, urination, or intercourse unless otherwise stated above. Pertinent History Reviewed:  Reviewed past medical,surgical, social and family history.  Reviewed problem list, medications and allergies. Physical Assessment:   Vitals:   09/04/23 0957  BP: (!) 130/90  Pulse: 64  SpO2: 97%  Weight: 202 lb 3.2 oz (91.7 kg)  Body mass index is 31.67 kg/m.        Physical Examination:   General appearance - well appearing, and in no distress  Mental status - alert, oriented to person, place, and time  Psych:  She has a normal mood and affect  Skin - warm and dry, normal color, no  suspicious lesions noted  Chest - effort normal, all lung fields clear to auscultation bilaterally  Heart - normal rate and regular rhythm  Neck:  midline trachea, no thyromegaly or nodules  Breasts - breasts appear normal, no suspicious masses, no skin or nipple changes or  axillary nodes  Abdomen - soft, nontender, nondistended, no masses or organomegaly  Pelvic - VULVA: normal appearing vulva with no masses, tenderness or lesions  VAGINA: normal appearing vagina with normal color and discharge, no lesions  CERVIX: normal appearing cervix without discharge or lesions, no CMT  Thin prep pap is {Desc; done/not:10129} *** HR HPV cotesting  UTERUS: uterus is felt to be normal size, shape, consistency and nontender   ADNEXA: No adnexal masses or tenderness noted.  Rectal - normal rectal, good sphincter tone, no masses felt. Hemoccult: ***  Extremities:  No swelling or varicosities noted  Chaperone present for exam  No results found for this or any previous visit (from the past 24 hours).  Assessment & Plan:  1) Well-Woman Exam  2) ***  Labs/procedures today: ***  Mammogram: {Mammo f/u:25212::@ 45yo}, or sooner if problems Colonoscopy: {TCS f/u:25213::@ 45yo}, or sooner if problems  No orders of the defined types were placed in this encounter.   Meds: No orders of the defined types were placed in this encounter.   Follow-up: No follow-ups on file.  Ronal GORMAN Pinal, MD 09/04/2023 10:54 AM

## 2023-09-05 ENCOUNTER — Ambulatory Visit (HOSPITAL_BASED_OUTPATIENT_CLINIC_OR_DEPARTMENT_OTHER): Payer: Self-pay | Admitting: Obstetrics & Gynecology

## 2023-09-05 LAB — LIPID PANEL
Chol/HDL Ratio: 1.8 ratio (ref 0.0–4.4)
Cholesterol, Total: 225 mg/dL — ABNORMAL HIGH (ref 100–199)
HDL: 122 mg/dL (ref >39)
LDL Chol Calc (NIH): 91 mg/dL (ref 0–99)
Triglycerides: 69 mg/dL (ref 0–149)
VLDL Cholesterol Cal: 12 mg/dL (ref 5–40)

## 2023-09-05 LAB — CBC WITH DIFFERENTIAL/PLATELET
Basophils Absolute: 0.1 x10E3/uL (ref 0.0–0.2)
Basos: 1 %
EOS (ABSOLUTE): 0.1 x10E3/uL (ref 0.0–0.4)
Eos: 2 %
Hematocrit: 42.7 % (ref 34.0–46.6)
Hemoglobin: 13.5 g/dL (ref 11.1–15.9)
Immature Grans (Abs): 0 x10E3/uL (ref 0.0–0.1)
Immature Granulocytes: 0 %
Lymphocytes Absolute: 2.1 x10E3/uL (ref 0.7–3.1)
Lymphs: 51 %
MCH: 26.9 pg (ref 26.6–33.0)
MCHC: 31.6 g/dL (ref 31.5–35.7)
MCV: 85 fL (ref 79–97)
Monocytes Absolute: 0.4 x10E3/uL (ref 0.1–0.9)
Monocytes: 11 %
Neutrophils Absolute: 1.4 x10E3/uL (ref 1.4–7.0)
Neutrophils: 35 %
Platelets: 194 x10E3/uL (ref 150–450)
RBC: 5.02 x10E6/uL (ref 3.77–5.28)
RDW: 12.9 % (ref 11.7–15.4)
WBC: 4 x10E3/uL (ref 3.4–10.8)

## 2023-09-05 LAB — COMPREHENSIVE METABOLIC PANEL WITH GFR
ALT: 16 IU/L (ref 0–32)
AST: 15 IU/L (ref 0–40)
Albumin: 4.6 g/dL (ref 3.9–4.9)
Alkaline Phosphatase: 76 IU/L (ref 44–121)
BUN/Creatinine Ratio: 10 (ref 9–23)
BUN: 8 mg/dL (ref 6–24)
Bilirubin Total: 0.3 mg/dL (ref 0.0–1.2)
CO2: 24 mmol/L (ref 20–29)
Calcium: 9.9 mg/dL (ref 8.7–10.2)
Chloride: 102 mmol/L (ref 96–106)
Creatinine, Ser: 0.78 mg/dL (ref 0.57–1.00)
Globulin, Total: 2.8 g/dL (ref 1.5–4.5)
Glucose: 84 mg/dL (ref 70–99)
Potassium: 4.5 mmol/L (ref 3.5–5.2)
Sodium: 141 mmol/L (ref 134–144)
Total Protein: 7.4 g/dL (ref 6.0–8.5)
eGFR: 95 mL/min/1.73 (ref >59)

## 2023-09-05 LAB — TSH: TSH: 1.37 u[IU]/mL (ref 0.450–4.500)

## 2023-09-05 LAB — HEMOGLOBIN A1C
Est. average glucose Bld gHb Est-mCnc: 120 mg/dL
Hgb A1c MFr Bld: 5.8 % — ABNORMAL HIGH (ref 4.8–5.6)

## 2023-09-06 ENCOUNTER — Encounter (HOSPITAL_BASED_OUTPATIENT_CLINIC_OR_DEPARTMENT_OTHER): Payer: Self-pay | Admitting: Obstetrics & Gynecology

## 2023-09-06 ENCOUNTER — Other Ambulatory Visit (HOSPITAL_BASED_OUTPATIENT_CLINIC_OR_DEPARTMENT_OTHER): Payer: Self-pay | Admitting: Obstetrics & Gynecology

## 2023-09-06 DIAGNOSIS — R7303 Prediabetes: Secondary | ICD-10-CM

## 2023-09-07 LAB — SURGICAL PATHOLOGY

## 2023-09-10 ENCOUNTER — Encounter (HOSPITAL_BASED_OUTPATIENT_CLINIC_OR_DEPARTMENT_OTHER): Payer: Self-pay

## 2023-09-10 LAB — CYTOLOGY - PAP
Comment: NEGATIVE
Diagnosis: NEGATIVE
Diagnosis: REACTIVE
High risk HPV: NEGATIVE

## 2023-09-10 NOTE — Progress Notes (Signed)
 Scanned document

## 2023-09-10 NOTE — Progress Notes (Unsigned)
 Scanned document

## 2023-09-14 ENCOUNTER — Encounter (HOSPITAL_BASED_OUTPATIENT_CLINIC_OR_DEPARTMENT_OTHER): Payer: Self-pay | Admitting: Obstetrics & Gynecology

## 2023-09-14 NOTE — Telephone Encounter (Signed)
 LMOM at 9:57 for patient to call office. Just want to know if you still want to proceed with repeat vaginal ultrasound. tbw

## 2023-10-22 ENCOUNTER — Ambulatory Visit: Payer: Self-pay | Admitting: Dietician

## 2023-12-16 DIAGNOSIS — R002 Palpitations: Secondary | ICD-10-CM | POA: Diagnosis not present

## 2024-01-25 DIAGNOSIS — Z1231 Encounter for screening mammogram for malignant neoplasm of breast: Secondary | ICD-10-CM | POA: Diagnosis not present

## 2024-03-31 ENCOUNTER — Ambulatory Visit (HOSPITAL_BASED_OUTPATIENT_CLINIC_OR_DEPARTMENT_OTHER): Admitting: Family Medicine

## 2024-06-30 ENCOUNTER — Ambulatory Visit (HOSPITAL_BASED_OUTPATIENT_CLINIC_OR_DEPARTMENT_OTHER): Admitting: Family Medicine
